# Patient Record
Sex: Male | Born: 1935 | Race: Black or African American | Hispanic: No | Marital: Married | Smoking: Never smoker
Health system: Southern US, Community
[De-identification: ages and names within clinical notes are randomized; demographics above are authoritative.]

## PROBLEM LIST (undated history)

## (undated) ENCOUNTER — Emergency Department (HOSPITAL_COMMUNITY): Admission: EM | Payer: Medicare Other | Source: Home / Self Care

## (undated) DIAGNOSIS — R7303 Prediabetes: Secondary | ICD-10-CM

## (undated) DIAGNOSIS — Z8489 Family history of other specified conditions: Secondary | ICD-10-CM

## (undated) DIAGNOSIS — I639 Cerebral infarction, unspecified: Secondary | ICD-10-CM

## (undated) DIAGNOSIS — N419 Inflammatory disease of prostate, unspecified: Secondary | ICD-10-CM

## (undated) DIAGNOSIS — E7800 Pure hypercholesterolemia, unspecified: Secondary | ICD-10-CM

## (undated) DIAGNOSIS — Z9989 Dependence on other enabling machines and devices: Secondary | ICD-10-CM

## (undated) DIAGNOSIS — I2510 Atherosclerotic heart disease of native coronary artery without angina pectoris: Secondary | ICD-10-CM

## (undated) DIAGNOSIS — G4733 Obstructive sleep apnea (adult) (pediatric): Secondary | ICD-10-CM

## (undated) DIAGNOSIS — I10 Essential (primary) hypertension: Secondary | ICD-10-CM

## (undated) DIAGNOSIS — I350 Nonrheumatic aortic (valve) stenosis: Secondary | ICD-10-CM

## (undated) DIAGNOSIS — I739 Peripheral vascular disease, unspecified: Secondary | ICD-10-CM

## (undated) DIAGNOSIS — K219 Gastro-esophageal reflux disease without esophagitis: Secondary | ICD-10-CM

## (undated) DIAGNOSIS — Z8719 Personal history of other diseases of the digestive system: Secondary | ICD-10-CM

## (undated) DIAGNOSIS — M1990 Unspecified osteoarthritis, unspecified site: Secondary | ICD-10-CM

## (undated) HISTORY — DX: Unspecified osteoarthritis, unspecified site: M19.90

## (undated) HISTORY — DX: Pure hypercholesterolemia, unspecified: E78.00

## (undated) HISTORY — DX: Peripheral vascular disease, unspecified: I73.9

## (undated) HISTORY — DX: Inflammatory disease of prostate, unspecified: N41.9

## (undated) HISTORY — DX: Gastro-esophageal reflux disease without esophagitis: K21.9

## (undated) HISTORY — DX: Essential (primary) hypertension: I10

## (undated) HISTORY — PX: CATARACT EXTRACTION W/ INTRAOCULAR LENS  IMPLANT, BILATERAL: SHX1307

## (undated) HISTORY — PX: CARPAL TUNNEL RELEASE: SHX101

## (undated) HISTORY — DX: Nonrheumatic aortic (valve) stenosis: I35.0

---

## 1987-09-11 HISTORY — PX: EXCISIONAL HEMORRHOIDECTOMY: SHX1541

## 2004-07-27 ENCOUNTER — Ambulatory Visit: Payer: Self-pay | Admitting: Pulmonary Disease

## 2004-08-18 ENCOUNTER — Ambulatory Visit: Payer: Self-pay | Admitting: Pulmonary Disease

## 2004-08-22 ENCOUNTER — Ambulatory Visit: Payer: Self-pay | Admitting: Pulmonary Disease

## 2004-12-11 ENCOUNTER — Ambulatory Visit: Payer: Self-pay | Admitting: Pulmonary Disease

## 2005-04-10 ENCOUNTER — Ambulatory Visit: Payer: Self-pay | Admitting: Pulmonary Disease

## 2005-04-19 ENCOUNTER — Ambulatory Visit: Payer: Self-pay | Admitting: Pulmonary Disease

## 2005-07-24 ENCOUNTER — Ambulatory Visit: Payer: Self-pay | Admitting: Pulmonary Disease

## 2005-08-21 ENCOUNTER — Encounter: Admission: RE | Admit: 2005-08-21 | Discharge: 2005-08-21 | Payer: Self-pay | Admitting: Orthopedic Surgery

## 2005-09-20 ENCOUNTER — Ambulatory Visit (HOSPITAL_BASED_OUTPATIENT_CLINIC_OR_DEPARTMENT_OTHER): Admission: RE | Admit: 2005-09-20 | Discharge: 2005-09-20 | Payer: Self-pay | Admitting: Orthopedic Surgery

## 2005-09-28 ENCOUNTER — Ambulatory Visit: Payer: Self-pay | Admitting: Pulmonary Disease

## 2005-11-07 ENCOUNTER — Ambulatory Visit: Payer: Self-pay | Admitting: Pulmonary Disease

## 2005-11-26 ENCOUNTER — Ambulatory Visit: Payer: Self-pay | Admitting: Pulmonary Disease

## 2006-02-11 ENCOUNTER — Ambulatory Visit: Payer: Self-pay | Admitting: Pulmonary Disease

## 2006-05-02 ENCOUNTER — Ambulatory Visit (HOSPITAL_BASED_OUTPATIENT_CLINIC_OR_DEPARTMENT_OTHER): Admission: RE | Admit: 2006-05-02 | Discharge: 2006-05-02 | Payer: Self-pay | Admitting: Orthopedic Surgery

## 2006-05-20 ENCOUNTER — Ambulatory Visit: Payer: Self-pay | Admitting: Pulmonary Disease

## 2006-07-17 ENCOUNTER — Ambulatory Visit: Payer: Self-pay | Admitting: Pulmonary Disease

## 2006-10-21 ENCOUNTER — Ambulatory Visit: Payer: Self-pay | Admitting: Pulmonary Disease

## 2006-10-21 LAB — CONVERTED CEMR LAB
ALT: 25 units/L (ref 0–40)
AST: 30 units/L (ref 0–37)
Basophils Relative: 0 % (ref 0.0–1.0)
Bilirubin, Direct: 0.1 mg/dL (ref 0.0–0.3)
CO2: 30 meq/L (ref 19–32)
Calcium: 9.2 mg/dL (ref 8.4–10.5)
Creatinine, Ser: 1.2 mg/dL (ref 0.4–1.5)
Eosinophils Relative: 3 % (ref 0.0–5.0)
GFR calc Af Amer: 76 mL/min
Glucose, Bld: 110 mg/dL — ABNORMAL HIGH (ref 70–99)
Hemoglobin: 14.3 g/dL (ref 13.0–17.0)
Lymphocytes Relative: 41 % (ref 12.0–46.0)
Neutro Abs: 1.9 10*3/uL (ref 1.4–7.7)
Platelets: 218 10*3/uL (ref 150–400)
RDW: 14.4 % (ref 11.5–14.6)
Total Bilirubin: 1 mg/dL (ref 0.3–1.2)
Total Protein: 7.4 g/dL (ref 6.0–8.3)
Triglycerides: 44 mg/dL (ref 0–149)
VLDL: 9 mg/dL (ref 0–40)
WBC: 4.3 10*3/uL — ABNORMAL LOW (ref 4.5–10.5)

## 2007-01-08 ENCOUNTER — Ambulatory Visit: Payer: Self-pay | Admitting: Pulmonary Disease

## 2007-01-08 LAB — CONVERTED CEMR LAB
AST: 26 units/L (ref 0–37)
Alkaline Phosphatase: 48 units/L (ref 39–117)
Basophils Relative: 0.3 % (ref 0.0–1.0)
CO2: 29 meq/L (ref 19–32)
Chloride: 105 meq/L (ref 96–112)
Cholesterol: 193 mg/dL (ref 0–200)
Creatinine, Ser: 1.1 mg/dL (ref 0.4–1.5)
HCT: 40.1 % (ref 39.0–52.0)
Hemoglobin: 13.2 g/dL (ref 13.0–17.0)
Hgb A1c MFr Bld: 6.8 % — ABNORMAL HIGH (ref 4.6–6.0)
LDL Cholesterol: 120 mg/dL — ABNORMAL HIGH (ref 0–99)
Monocytes Absolute: 0.7 10*3/uL (ref 0.2–0.7)
Neutrophils Relative %: 50.5 % (ref 43.0–77.0)
Potassium: 3.7 meq/L (ref 3.5–5.1)
RBC: 5.1 M/uL (ref 4.22–5.81)
RDW: 14.7 % — ABNORMAL HIGH (ref 11.5–14.6)
Sodium: 140 meq/L (ref 135–145)
TSH: 1.6 microintl units/mL (ref 0.35–5.50)
Total Bilirubin: 0.9 mg/dL (ref 0.3–1.2)
Total CHOL/HDL Ratio: 3
Total Protein: 7.1 g/dL (ref 6.0–8.3)
VLDL: 8 mg/dL (ref 0–40)
WBC: 3.9 10*3/uL — ABNORMAL LOW (ref 4.5–10.5)

## 2007-04-21 ENCOUNTER — Ambulatory Visit: Payer: Self-pay | Admitting: Pulmonary Disease

## 2007-04-21 LAB — CONVERTED CEMR LAB
ALT: 18 units/L (ref 0–53)
Albumin: 4 g/dL (ref 3.5–5.2)
Alkaline Phosphatase: 52 units/L (ref 39–117)
BUN: 10 mg/dL (ref 6–23)
CO2: 28 meq/L (ref 19–32)
Calcium: 8.8 mg/dL (ref 8.4–10.5)
GFR calc Af Amer: 84 mL/min
Potassium: 3.7 meq/L (ref 3.5–5.1)
Total CHOL/HDL Ratio: 2.5
Total Protein: 7.2 g/dL (ref 6.0–8.3)
Triglycerides: 33 mg/dL (ref 0–149)
VLDL: 7 mg/dL (ref 0–40)

## 2007-04-25 ENCOUNTER — Ambulatory Visit: Payer: Self-pay | Admitting: Pulmonary Disease

## 2007-04-25 LAB — CONVERTED CEMR LAB
Hemoglobin, Urine: NEGATIVE
Hgb A1c MFr Bld: 6.4 % — ABNORMAL HIGH (ref 4.6–6.0)
Leukocytes, UA: NEGATIVE
Nitrite: NEGATIVE
Urobilinogen, UA: 0.2 (ref 0.0–1.0)

## 2007-08-05 ENCOUNTER — Ambulatory Visit: Payer: Self-pay | Admitting: Pulmonary Disease

## 2007-11-12 ENCOUNTER — Encounter: Payer: Self-pay | Admitting: Pulmonary Disease

## 2007-12-01 DIAGNOSIS — K649 Unspecified hemorrhoids: Secondary | ICD-10-CM | POA: Insufficient documentation

## 2007-12-01 DIAGNOSIS — Z87448 Personal history of other diseases of urinary system: Secondary | ICD-10-CM | POA: Insufficient documentation

## 2007-12-01 DIAGNOSIS — I739 Peripheral vascular disease, unspecified: Secondary | ICD-10-CM

## 2007-12-01 DIAGNOSIS — K219 Gastro-esophageal reflux disease without esophagitis: Secondary | ICD-10-CM | POA: Insufficient documentation

## 2007-12-01 DIAGNOSIS — J309 Allergic rhinitis, unspecified: Secondary | ICD-10-CM | POA: Insufficient documentation

## 2007-12-01 DIAGNOSIS — I10 Essential (primary) hypertension: Secondary | ICD-10-CM | POA: Insufficient documentation

## 2007-12-01 DIAGNOSIS — M1990 Unspecified osteoarthritis, unspecified site: Secondary | ICD-10-CM | POA: Insufficient documentation

## 2007-12-01 DIAGNOSIS — E7800 Pure hypercholesterolemia, unspecified: Secondary | ICD-10-CM

## 2007-12-02 ENCOUNTER — Ambulatory Visit: Payer: Self-pay | Admitting: Pulmonary Disease

## 2007-12-02 DIAGNOSIS — R739 Hyperglycemia, unspecified: Secondary | ICD-10-CM | POA: Insufficient documentation

## 2007-12-21 LAB — CONVERTED CEMR LAB
ALT: 22 units/L (ref 0–53)
Alkaline Phosphatase: 50 units/L (ref 39–117)
Basophils Absolute: 0.1 10*3/uL (ref 0.0–0.1)
Bilirubin, Direct: 0.2 mg/dL (ref 0.0–0.3)
CO2: 32 meq/L (ref 19–32)
Calcium: 9.5 mg/dL (ref 8.4–10.5)
Cholesterol: 211 mg/dL (ref 0–200)
Direct LDL: 109.5 mg/dL
GFR calc Af Amer: 76 mL/min
HDL: 90.9 mg/dL (ref >39.0)
Hemoglobin: 14.4 g/dL (ref 13.0–17.0)
Lymphocytes Relative: 20.1 % (ref 12.0–46.0)
MCHC: 31.4 g/dL (ref 30.0–36.0)
Monocytes Relative: 16.3 % — ABNORMAL HIGH (ref 3.0–11.0)
Neutro Abs: 3.3 10*3/uL (ref 1.4–7.7)
Neutrophils Relative %: 58.3 % (ref 43.0–77.0)
PSA: 0.69 ng/mL (ref 0.10–4.00)
Platelets: 209 10*3/uL (ref 150–400)
Potassium: 4.2 meq/L (ref 3.5–5.1)
RDW: 15.1 % — ABNORMAL HIGH (ref 11.5–14.6)
Sodium: 137 meq/L (ref 135–145)
Total Bilirubin: 1 mg/dL (ref 0.3–1.2)
Total CHOL/HDL Ratio: 2.3
Triglycerides: 37 mg/dL (ref 0–149)
VLDL: 7 mg/dL (ref 0–40)

## 2008-01-23 ENCOUNTER — Telehealth: Payer: Self-pay | Admitting: Pulmonary Disease

## 2008-05-19 ENCOUNTER — Encounter: Payer: Self-pay | Admitting: Pulmonary Disease

## 2008-05-31 ENCOUNTER — Ambulatory Visit: Payer: Self-pay | Admitting: Pulmonary Disease

## 2008-05-31 LAB — CONVERTED CEMR LAB
BUN: 17 mg/dL (ref 6–23)
CO2: 29 meq/L (ref 19–32)
Chloride: 108 meq/L (ref 96–112)
Cholesterol: 190 mg/dL (ref 0–200)
Creatinine, Ser: 1.1 mg/dL (ref 0.4–1.5)
HDL: 76.7 mg/dL (ref >39.0)
Triglycerides: 34 mg/dL (ref 0–149)

## 2008-07-07 ENCOUNTER — Ambulatory Visit: Payer: Self-pay | Admitting: Pulmonary Disease

## 2008-07-12 LAB — CONVERTED CEMR LAB
Fecal Occult Blood: NEGATIVE
OCCULT 3: NEGATIVE
OCCULT 4: NEGATIVE
OCCULT 5: NEGATIVE

## 2008-11-17 ENCOUNTER — Encounter: Payer: Self-pay | Admitting: Pulmonary Disease

## 2008-11-29 ENCOUNTER — Ambulatory Visit: Payer: Self-pay | Admitting: Pulmonary Disease

## 2008-11-29 IMAGING — CR DG CHEST 2V
2 series · 2 of 2 positions shown · non-contrast
Comparison: [DATE]

CLINICAL DATA: Hypertension.  Peripheral vascular disease.

CHEST - 2 VIEW

[view not recorded (1 of 2)]
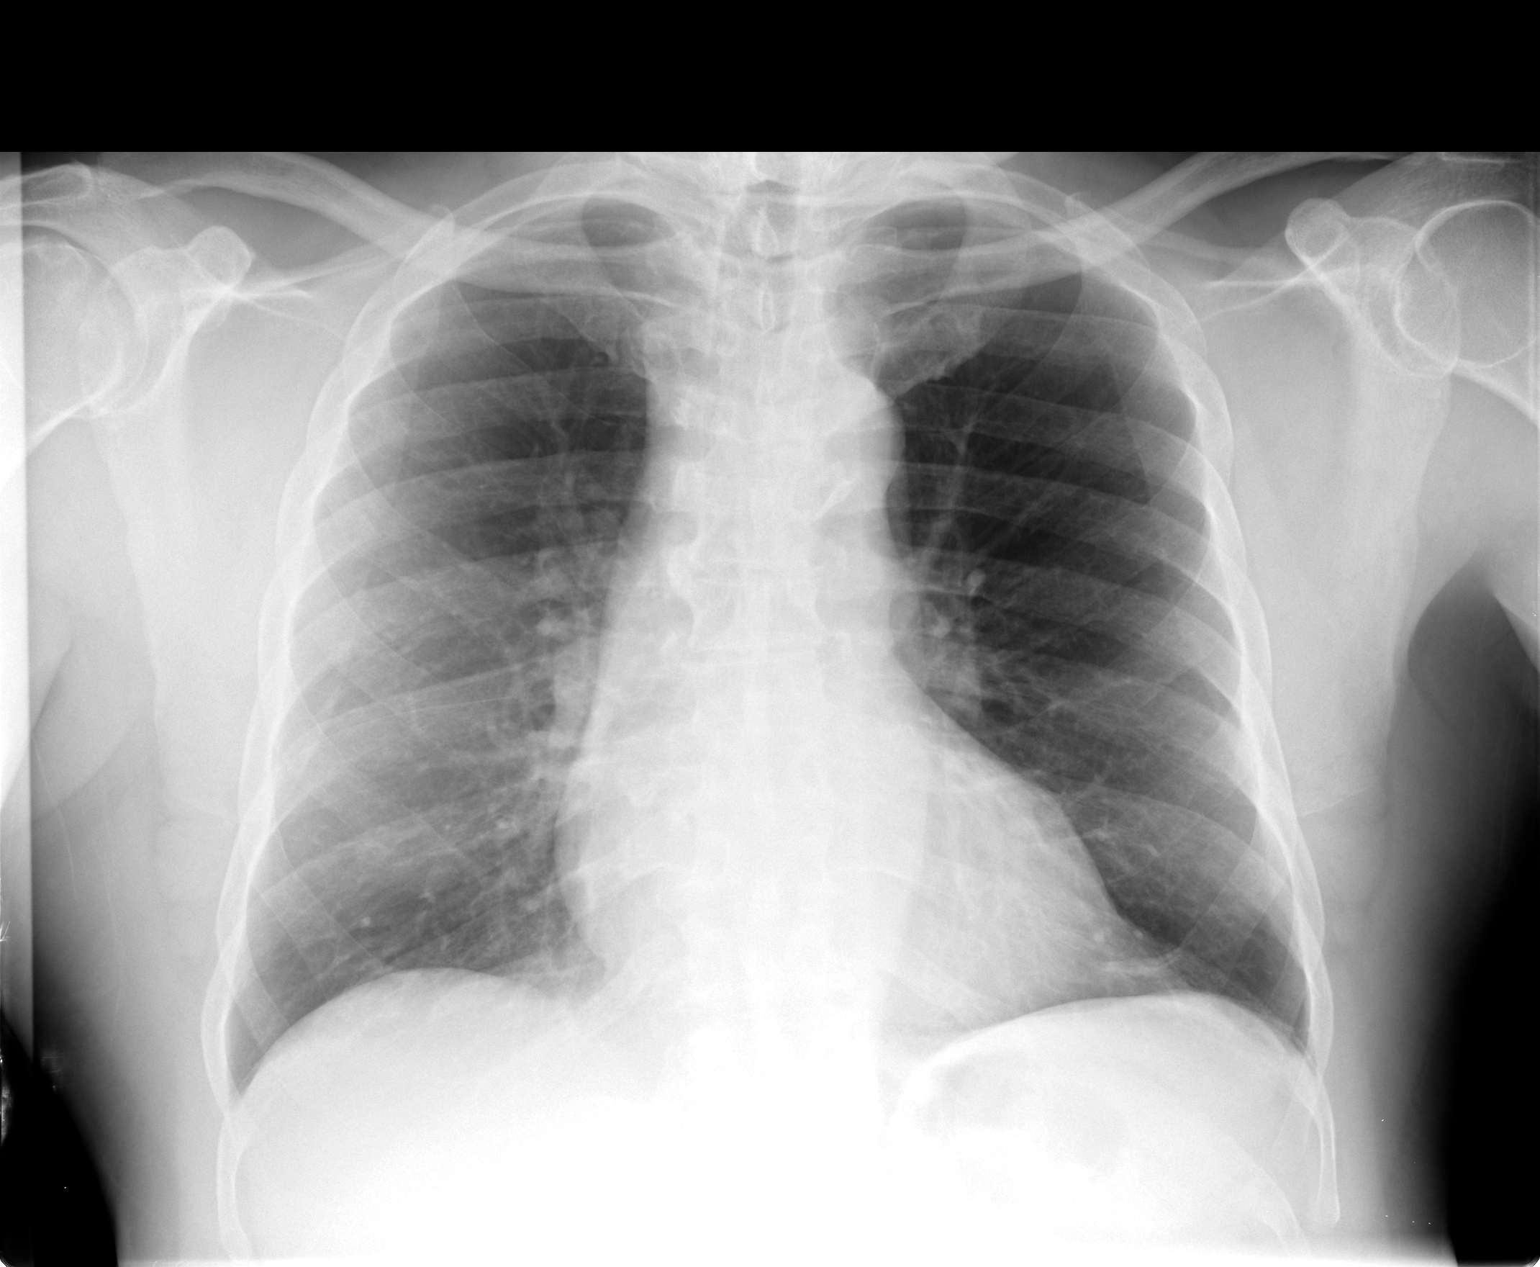

[view not recorded (2 of 2)]
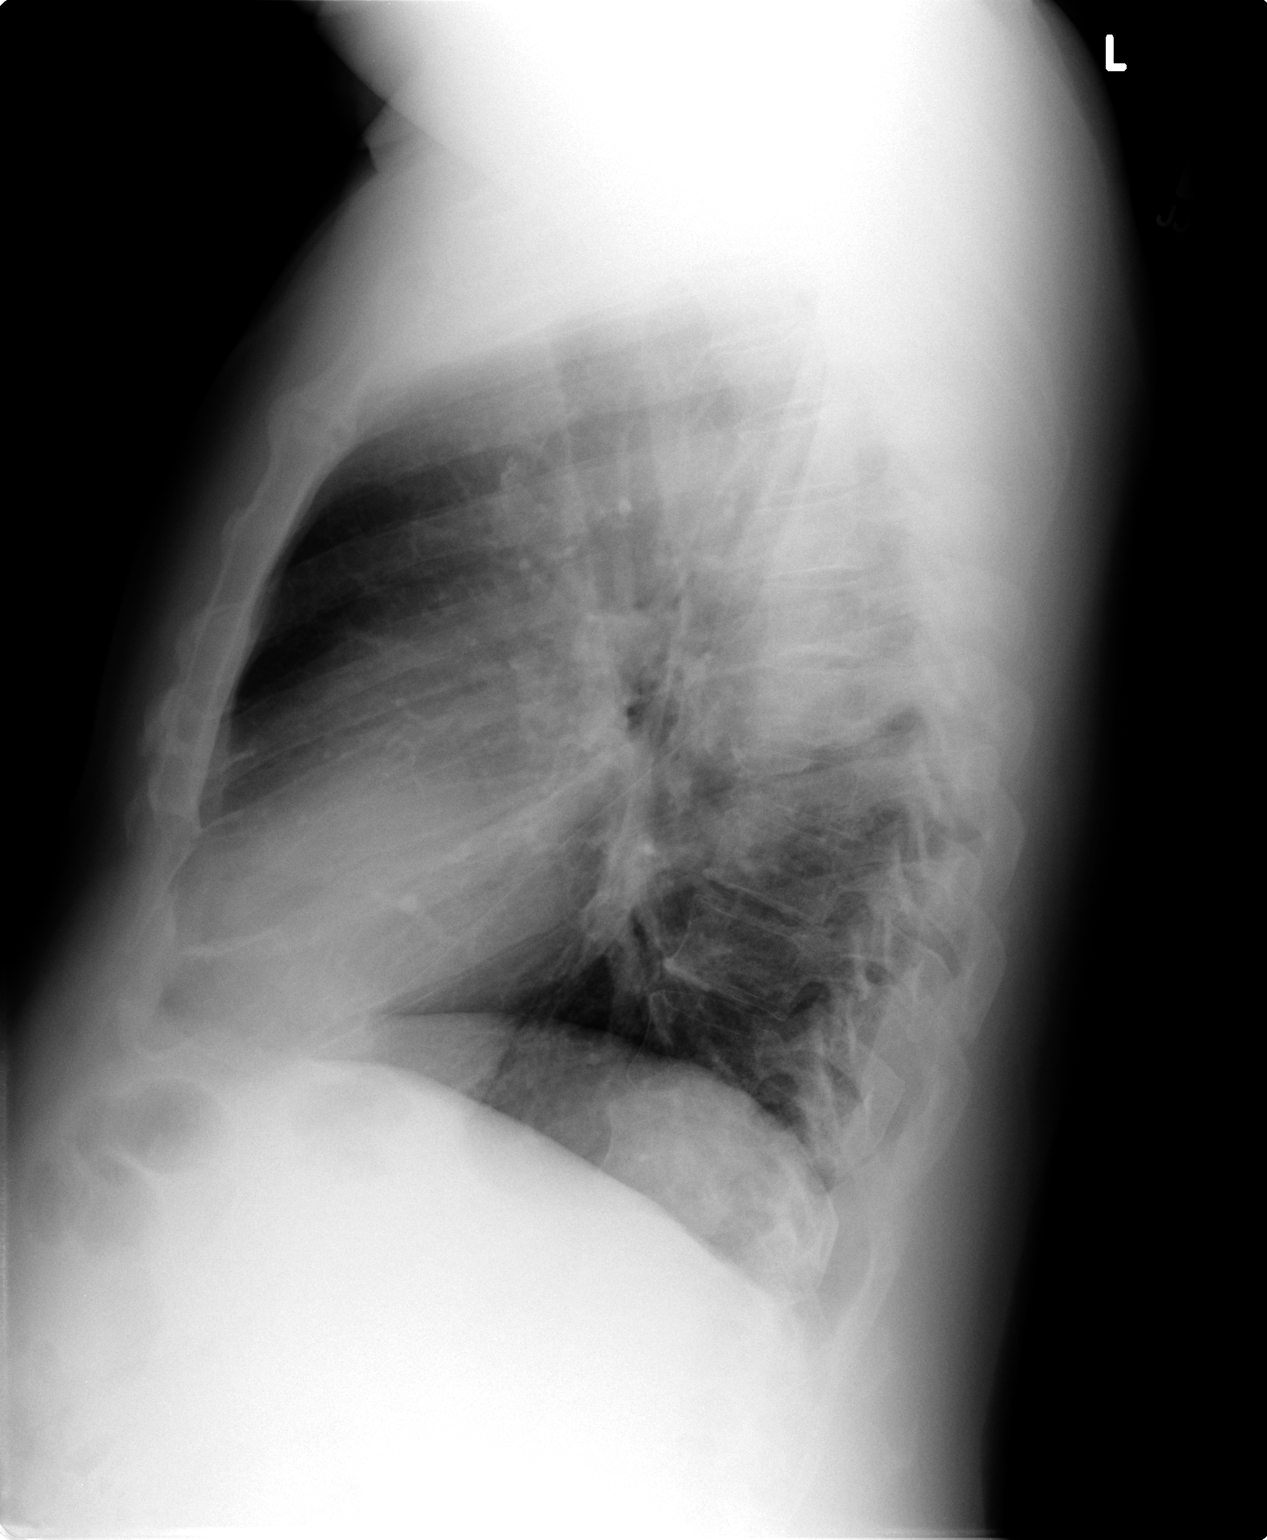

[2 of 2 positions shown; findings below may reference images not displayed]

FINDINGS: Heart size is normal.  The aorta shows calcification but
is otherwise unremarkable.  The lungs are clear.  No effusions.  No
significant bony finding.
IMPRESSION: No active disease

## 2008-12-14 LAB — CONVERTED CEMR LAB
BUN: 17 mg/dL (ref 6–23)
Basophils Relative: 0.4 % (ref 0.0–3.0)
Bilirubin, Direct: 0.2 mg/dL (ref 0.0–0.3)
CO2: 32 meq/L (ref 19–32)
Chloride: 104 meq/L (ref 96–112)
Cholesterol: 238 mg/dL — ABNORMAL HIGH (ref 0–200)
Creatinine, Ser: 1.2 mg/dL (ref 0.4–1.5)
Eosinophils Absolute: 0.2 10*3/uL (ref 0.0–0.7)
Glucose, Bld: 108 mg/dL — ABNORMAL HIGH (ref 70–99)
Hgb A1c MFr Bld: 6.7 % — ABNORMAL HIGH (ref 4.6–6.5)
MCHC: 32.4 g/dL (ref 30.0–36.0)
MCV: 80 fL (ref 78.0–100.0)
Monocytes Absolute: 0.8 10*3/uL (ref 0.1–1.0)
Neutrophils Relative %: 48 % (ref 43.0–77.0)
PSA: 0.66 ng/mL (ref 0.10–4.00)
Platelets: 182 10*3/uL (ref 150.0–400.0)
RBC: 5.36 M/uL (ref 4.22–5.81)
Total Protein: 7.3 g/dL (ref 6.0–8.3)

## 2009-03-30 ENCOUNTER — Telehealth (INDEPENDENT_AMBULATORY_CARE_PROVIDER_SITE_OTHER): Payer: Self-pay | Admitting: *Deleted

## 2009-05-18 ENCOUNTER — Encounter: Payer: Self-pay | Admitting: Pulmonary Disease

## 2009-05-19 ENCOUNTER — Ambulatory Visit: Payer: Self-pay | Admitting: Pulmonary Disease

## 2009-05-21 LAB — CONVERTED CEMR LAB
ALT: 35 units/L (ref 0–53)
AST: 35 units/L (ref 0–37)
Albumin: 4.2 g/dL (ref 3.5–5.2)
Alkaline Phosphatase: 45 units/L (ref 39–117)
CO2: 30 meq/L (ref 19–32)
Chloride: 99 meq/L (ref 96–112)
Glucose, Bld: 107 mg/dL — ABNORMAL HIGH (ref 70–99)
Potassium: 3.6 meq/L (ref 3.5–5.1)
Sodium: 138 meq/L (ref 135–145)
Total Protein: 7.4 g/dL (ref 6.0–8.3)
Triglycerides: 27 mg/dL (ref 0.0–149.0)

## 2009-07-12 ENCOUNTER — Ambulatory Visit: Payer: Self-pay | Admitting: Pulmonary Disease

## 2009-11-15 ENCOUNTER — Ambulatory Visit: Payer: Self-pay | Admitting: Pulmonary Disease

## 2009-11-20 LAB — CONVERTED CEMR LAB
ALT: 24 units/L (ref 0–53)
AST: 30 units/L (ref 0–37)
Basophils Relative: 1.2 % (ref 0.0–3.0)
Bilirubin, Direct: 0.2 mg/dL (ref 0.0–0.3)
Chloride: 105 meq/L (ref 96–112)
Eosinophils Absolute: 0.2 10*3/uL (ref 0.0–0.7)
HCT: 44.1 % (ref 39.0–52.0)
LDL Cholesterol: 98 mg/dL (ref 0–99)
Lymphs Abs: 1.1 10*3/uL (ref 0.7–4.0)
MCHC: 31.5 g/dL (ref 30.0–36.0)
MCV: 80.9 fL (ref 78.0–100.0)
Monocytes Absolute: 0.6 10*3/uL (ref 0.1–1.0)
Neutrophils Relative %: 55.6 % (ref 43.0–77.0)
PSA: 0.69 ng/mL (ref 0.10–4.00)
Platelets: 182 10*3/uL (ref 150.0–400.0)
Potassium: 4.1 meq/L (ref 3.5–5.1)
RBC: 5.45 M/uL (ref 4.22–5.81)
Total Bilirubin: 0.6 mg/dL (ref 0.3–1.2)
Total CHOL/HDL Ratio: 2

## 2010-05-26 ENCOUNTER — Ambulatory Visit: Payer: Self-pay | Admitting: Pulmonary Disease

## 2010-05-30 LAB — CONVERTED CEMR LAB
BUN: 17 mg/dL (ref 6–23)
CO2: 29 meq/L (ref 19–32)
Calcium: 9 mg/dL (ref 8.4–10.5)
Cholesterol: 222 mg/dL — ABNORMAL HIGH (ref 0–200)
Creatinine, Ser: 1.1 mg/dL (ref 0.4–1.5)
Direct LDL: 136.2 mg/dL
Total CHOL/HDL Ratio: 3
VLDL: 6.6 mg/dL (ref 0.0–40.0)

## 2010-06-06 ENCOUNTER — Encounter: Payer: Self-pay | Admitting: Pulmonary Disease

## 2010-06-14 ENCOUNTER — Telehealth: Payer: Self-pay | Admitting: Pulmonary Disease

## 2010-06-16 ENCOUNTER — Telehealth (INDEPENDENT_AMBULATORY_CARE_PROVIDER_SITE_OTHER): Payer: Self-pay | Admitting: *Deleted

## 2010-07-24 ENCOUNTER — Telehealth (INDEPENDENT_AMBULATORY_CARE_PROVIDER_SITE_OTHER): Payer: Self-pay | Admitting: *Deleted

## 2010-08-09 ENCOUNTER — Telehealth: Payer: Self-pay | Admitting: Pulmonary Disease

## 2010-10-10 NOTE — Progress Notes (Signed)
Summary: [facility] PA  Phone Note Outgoing Call   Call placed by: Vernie Murders,  June 16, 2010 2:39 PM Call placed to: Insurer Summary of Call: Recieved PA form from [facility] for [facility].  Called [employer] to initiate PA.  Was advised that alternate meds are fluticasone, nasonex, and flunisolide.  Will forward to SN to see what he prefers the patient switch to. Initial call taken by: Vernie Murders,  June 16, 2010 2:47 PM  Follow-up for Phone Call        Spoke with SN; okay to change to Nasonex #1 1-2 sprays in each nostril two times a day with 3 refills. Sent to pharmacy.Reynaldo Minium CMA  June 16, 2010 2:53 PM     New/Updated Medications: NASONEX 50 MCG/ACT SUSP (MOMETASONE FUROATE) 1-2 sprays in each nostril two times a day Prescriptions: NASONEX 50 MCG/ACT SUSP (MOMETASONE FUROATE) 1-2 sprays in each nostril two times a day  #1 x 3   Entered by:   Reynaldo Minium CMA   Authorized by:   Michele Mcalpine MD   Signed by:   Reynaldo Minium CMA on 06/16/2010   Method used:   [facility] to        [phone]       [address] [place], [place]  [address]       Ph: [phone] or [phone]       Fax: [phone]   RxID:   [id number]

## 2010-10-10 NOTE — Letter (Signed)
Summary: [facility]   Imported By: Sherian Rein 06/13/2010 10:27:35  _____________________________________________________________________  External Attachment:    Type:   Image     Comment:   External Document

## 2010-10-10 NOTE — Progress Notes (Signed)
Summary: dizziness - calling back  Phone Note Call from Patient   Caller: Patient Call For: nadel Summary of Call: pt complaining of dizziness no coughing/ have sinus drainage. Initial call taken by: Rickard Patience,  July 24, 2010 2:26 PM  Follow-up for Phone Call        Pt c/o feeling dizzy when he stood up and walked to the door today. This is the only time this has happened. Pt denies sinus cngestion, no ear pain or congestion. Pt states he had his ears cleaned out recently.  Pt also states his BP been running normal. Please advise. Carron Curie CMA  July 24, 2010 3:21 PM   Additional Follow-up for Phone Call Additional follow up Details #1::        per SN---change positions slowly, watch and see how he does---and thanks for the update on his status. Randell Loop [facility]  July 24, 2010 4:05 PM   [facility]  Gweneth Dimitri RN  July 24, 2010 4:13 PM   LMTCB.Reynaldo Minium CMA  July 24, 2010 4:44 PM      Additional Follow-up for Phone Call Additional follow up Details #2::    Patient returning call.  [phone] [facility] [address]  July 25, 2010 8:07 AM  [facility] x1. Abigail Miyamoto RN  July 25, 2010 8:39 AM   Pt informed of Dr Jodelle Green recommendations.  Pt will call back if symptoms do not improve. Abigail Miyamoto RN  July 25, 2010 9:07 AM

## 2010-10-10 NOTE — Assessment & Plan Note (Signed)
Summary: 6 month follow up/la   CC:  6 month ROV & review of mult medical problems....  History of Present Illness: 77 y/o BM here for a follow up visit... he has mult med problems as noted below...   ~  May 19, 2009:  he's had a good 6 months- no new complaints or concerns... BP well controlled on meds- taking regularly & tol well... he's been on diet & weight 181# (no change) w/ BS at home "all OK" per pt... FLP 3/10 showed incr TChol & LDL as noted so his Simvastatin was incr from 20 to 40mg /d (tol well & due for f/u FLP)... he still follows up w/ DrDahlstedt for Urology & was seen yest- doing well he reports...    ~  November 15, 2009:  he continues stable- no new complaints or concerns...  BP controleed on meds;  FLP improved w/ Simva40;  DM stable on diet alone- weight 181# & needs better diet & exercise program!   ~  May 26, 2010:  another good 53mo- no new complaints or concerns... BP controlled on meds;  TChol & LDL are not at goal on the Simva40, and since he is on Norvasc as well we will have to switch to Lipitor;  BS remains adeq on diet alone;  he exercises w/ yard work & weight is down 3# to 178# today... OK 2011 Flu vaccine.    Current Problem List:  ALLERGIC RHINITIS (ICD-477.9) - he uses OTC Zyrtek & Mucinex, and knows to avoid pseudophed...  HYPERTENSION (ICD-401.9) - on NORVASC 10mg /d,  MICARDIS/Hct 80-25 daily,  & K20/d...   ~  NuclearStressTest 4/05 was norm- no infarct or iscemia, EF=72%...  ~  BP today = 116/64, feeling well, taking meds regularly, no reported side-effects... home checks are all good per pt- "even better than here"... denies HA, fatigue, visual changes, CP, palipit, dizziness, syncope, dyspnea, edema, etc...  PERIPHERAL VASCULAR DISEASE (ICD-443.9) - on ASA 81mg /d... no leg pain or claudic symptoms.  ~  ABI's 2005 showed .92R & .78L...  HYPERCHOLESTEROLEMIA (ICD-272.0) - on SIMVASTATIN 40mg /d + FISH OIL & Garlic pills...  ~  FLP 8/08 on  Simva20 showed TChol 199, TG 33, HDL 81, LDL 111  ~  FLP 3/09 on Simva20 showed TChol 211, TG 37, HDL 91, LDL 110... great HDL!!!  ~  FLP 9/09 on Simva20 showed TChol 190, TG 34, HDL 77, LDL 107  ~  FLP 3/10 on Simva20 showed TChol 238, Tg 35, HDL 76, LDL 138... rec> incr Simva40.  ~  FLP 9/10 on Simva40 showed TChol 206, TG 27, HDL 81, LDL 108  ~  FLP 3/11 on Simva40 showed TChol 189, TG 32, HDL 83, LDL 98  ~  FLP 9/11 on Simva40 showed TChol 222, TG 33, HDL 76, LDL 136... not at goal, rec> ch to Lipitor40.  DIABETES MELLITUS, BORDERLINE (ICD-790.29) - on diet alone & we discussed diet + exercise program.  ~  labs 8/08 showed BS=107, HgA1c=6.4 on diet alone... rec to lose some weight.  ~  labs 3/09 (wt=177#)showed BS= 94, HgA1c= 6.6.Marland KitchenMarland Kitchen rec- better diet efforts...  ~  labs 9/09 (wt=176#) showed BS= 101, HgA1c= 6.6  ~  labs 3/10 (wt=181#) showed BS= 108, A1c= 6.7  ~  labs 9/10 (wt=181#) showed BS= 107, A1c= 6.6  ~  labs 3/11 (wt=181#) showed BS= 103  ~  labs 9/11 (wt=178##) showed BS= 96, A1c 6.9  GERD (ICD-530.81) - he takes Zantac 150mg /d as needed.Marland KitchenMarland Kitchen  HEMORRHOIDS (ICD-455.6) - last colonoscopy was 4/04 by DrPatterson and norm x for hems...  PROSTATITIS, HX OF (ICD-V13.09) - followed by DrDahlstadt & seen 9/09- note reviewed... he has BPH,  renal cyst, ED... Rx w/ Viagra as needed.  ~  labs 3/09 showed PSA= 0.69  ~  labs 3/10 showed PSA= 0.66  ~  labs 3/11 showed PSA= 0.69  DEGENERATIVE JOINT DISEASE (ICD-715.90)  HEALTH MAINTENANCE - he gets the yearly seasonal flu vaccines... [facility]age 28[facility]... had Tetanus shot in ER  ~2006...   Preventive Screening-Counseling & Management  Alcohol-Tobacco     Smoking Status: never  Allergies: 1)  ! Tetracycline  Comments:  Nurse/Medical Assistant: The patient's medications and allergies were reviewed with the patient and were updated in the Medication and Allergy Lists.  Past History:  Past  Medical History: ALLERGIC RHINITIS (ICD-477.9) HYPERTENSION (ICD-401.9) PERIPHERAL VASCULAR DISEASE (ICD-443.9) HYPERCHOLESTEROLEMIA (ICD-272.0) DIABETES MELLITUS, BORDERLINE (ICD-790.29) GERD (ICD-530.81) HEMORRHOIDS (ICD-455.6) PROSTATITIS, HX OF (ICD-V13.09) DEGENERATIVE JOINT DISEASE (ICD-715.90)  Past Surgical History: S/P wisdom teeth surg S/P inguinal hernia repair 1989 S/P bilat CTS by DrSypher  Family History: Reviewed history and no changes required.  Social History: Reviewed history from 05/31/2008 and no changes required. non smoker no etoh retired  Review of Systems      See HPI  The patient denies anorexia, fever, weight loss, weight gain, vision loss, decreased hearing, hoarseness, chest pain, syncope, dyspnea on exertion, peripheral edema, prolonged cough, headaches, hemoptysis, abdominal pain, melena, hematochezia, severe indigestion/heartburn, hematuria, incontinence, muscle weakness, suspicious skin lesions, transient blindness, difficulty walking, depression, unusual weight change, abnormal bleeding, enlarged lymph nodes, and angioedema.    Vital Signs:  Patient profile:   77 year old male Height:      65 inches Weight:      178 pounds BMI:     29.73 O2 Sat:      100 % on Room air Temp:     98.4 degrees F oral Pulse rate:   79 / minute BP sitting:   116 / 64  (left arm) Cuff size:   regular  Vitals Entered By: Randell Loop CMA (May 26, 2010 10:25 AM)  O2 Sat at Rest %:  100 O2 Flow:  Room air CC: 6 month ROV & review of mult medical problems... Is Patient Diabetic? No Pain Assessment Patient in pain? no      Comments no changes in meds today   Physical Exam  Additional Exam:  WD, Overweight, 77 y/o BM in NAD... GENERAL:  Alert & oriented; pleasant & cooperative... HEENT:  [place]/AT, EOM-wnl, PERRLA, EACs-clear, TMs-wnl, NOSE-clear, THROAT-clear & wnl. NECK:  Supple w/ fair ROM; no JVD; normal carotid impulses w/o bruits; no  thyromegaly or nodules palpated; no lymphadenopathy. CHEST:  Clear to P & A; without wheezes/ rales/ or rhonchi. HEART:  Regular Rhythm; without murmurs/ rubs/ or gallops. ABDOMEN:  Soft & nontender; normal bowel sounds; no organomegaly or masses detected. EXT: without deformities, mild arthritic changes; no varicose veins/ +venous insuffic/ no edema. NEURO:  CN's intact; motor testing normal; sensory testing normal; gait normal & balance OK. DERM:  No lesions noted; no rash etc...    MISC. Report  Procedure date:  05/26/2010  Findings:      BMP (METABOL)   Sodium                    139 mEq/L  135-145   Potassium                 4.0 mEq/L                   3.5-5.1   Chloride                  103 mEq/L                   96-112   Carbon Dioxide            29 mEq/L                    19-32   Glucose                   96 mg/dL                    16-10   BUN                       17 mg/dL                    9-60   Creatinine                1.1 mg/dL                   4.5-4.0   Calcium                   9.0 mg/dL                   9.8-11.9   GFR                       81.17 mL/min                >60  Hemoglobin A1C (A1C)   Hemoglobin A1C       [H]  6.9 %                       4.6-6.5   Lipid Panel (LIPID)   Cholesterol          [H]  222 mg/dL                   1-478   Triglycerides             33.0 mg/dL                  2.9-562.1   HDL                       30.86 mg/dL                 >57.84  Cholesterol LDL - Direct                             136.2 mg/dL   Impression & Recommendations:  Problem # 1:  HYPERTENSION (ICD-401.9) Contreolled> continue current meds. His updated medication list for this problem includes:    Amlodipine Besylate 10 Mg Tabs (Amlodipine besylate) .Marland Kitchen... Take 1 tab once daily...    Micardis Hct 80-25 Mg Tabs (Telmisartan-hctz) .Marland Kitchen... Take one tablet every day  Orders: TLB-BMP (Basic Metabolic Panel-BMET) (80048-METABOL) TLB-A1C / Hgb A1C  (Glycohemoglobin) (83036-A1C) TLB-Lipid Panel (80061-LIPID)  Problem # 2:  PERIPHERAL VASCULAR DISEASE (ICD-443.9) Stable on ASA & exercise program...  Problem # 3:  HYPERCHOLESTEROLEMIA (ICD-272.0) Numbers aren't as good despite 3# weight loss... rec change Simva40 to Lipitor 40... continue diet + exercise. His updated medication list for this problem includes:    Simvastatin 40 Mg Tabs (Simvastatin) .Marland Kitchen... Take one tablet by mouth at bedtime  Problem # 4:  DIABETES MELLITUS, BORDERLINE (ICD-790.29) Stable but A1c is drifting closer to 7... rec better diet, & exercise> not yet on meds.  Problem # 5:  GERD (ICD-530.81) GI is stable... same Rx. His updated medication list for this problem includes:    Zantac 150 Maximum Strength 150 Mg Tabs (Ranitidine hcl) .Marland Kitchen... Take 1 tab by mouth once daily as directed...  Problem # 6:  PROSTATITIS, HX OF (ICD-V13.09) GU is stable...  Problem # 7:  OTHER MEDICAL PROBLEMS AS NOTED>>> OK Flu shot today...  Complete Medication List: 1)  Mucinex 600 Mg Xr12h-tab (Guaifenesin) .... Take 1-2 tabs by mouth two times a day as needed for congestion 2)  Aspirin Adult Low Strength 81 Mg Tbec (Aspirin) .... Take 1 tablet by mouth once a day 3)  Amlodipine Besylate 10 Mg Tabs (Amlodipine besylate) .... Take 1 tab once daily.Marland KitchenMarland Kitchen 4)  Micardis Hct 80-25 Mg Tabs (Telmisartan-hctz) .... Take one tablet every day 5)  Klor-con M20 20 Meq Tbcr (Potassium chloride crys cr) .... Take 1 tablet by mouth once a day 6)  Simvastatin 40 Mg Tabs (Simvastatin) .... Take one tablet by mouth at bedtime 7)  Fish Oil 1000 Mg Caps (Omega-3 fatty acids) .... Take 1 tablet by mouth once a day 8)  Garlic Oil 1000 Mg Caps (Garlic) .... Take 1 tablet by mouth once a day 9)  Zantac 150 Maximum Strength 150 Mg Tabs (Ranitidine hcl) .... Take 1 tab by mouth once daily as directed... 10)  Centrum Silver Tabs (Multiple vitamins-minerals) .... Take 1 tablet by mouth once a day  Patient  Instructions: 1)  Today we updated your med list- see below.... 2)  Continue your current medications the same... 3)  Today we did your follow up FASTING Chol & DM labs... please call the "phone tree" in a few days for your lab results.Marland KitchenMarland Kitchen 4)  Stay as active as poss, and be as CAREFUL as poss... 5)  Call for any problems.Marland KitchenMarland Kitchen 6)  Please schedule a follow-up appointment in 6 months.

## 2010-10-10 NOTE — Assessment & Plan Note (Signed)
Summary: rov 6 months///kp   CC:  6 month ROV & review of mult medical problems....  History of Present Illness: 77 y/o BM here for a follow up visit... he has mult med problems as noted below...   ~  May 19, 2009:  he's had a good 6 months- no new complaints or concerns... BP well controlled on meds- taking regularly & tol well... he's been on diet & weight 181# (no change) w/ BS at home "all OK" per pt... FLP 3/10 showed incr TChol & LDL as noted so his Simvastatin was incr from 20 to 40mg /d (tol well & due for f/u FLP)... he still follows up w/ DrDahlstedt for Urology & was seen yest- doing well he reports...    ~  November 15, 2009:  he continues stable- no new complaints or concerns...  BP controleed on meds;  FLP improved w/ Simva40;  DM stable on diet alone- weight 181# & needs better diet & exercise program!    Current Problem List:  ALLERGIC RHINITIS (ICD-477.9) - he uses OTC Zyrtek & Mucinex, and knows to avoid pseudophed...  HYPERTENSION (ICD-401.9) - on NORVASC 10mg /d,  MICARDIS/Hct 80-25 daily,  & K20/d...   ~  NuclearStressTest 4/05 was norm- no infarct or iscemia, EF=72%...  ~  BP today = 128/80, feeling well, taking meds regularly, no reported side-effects... home checks are all good per pt- "even better than here"... denies HA, fatigue, visual changes, CP, palipit, dizziness, syncope, dyspnea, edema, etc...  PERIPHERAL VASCULAR DISEASE (ICD-443.9) - on ASA 81mg /d... no leg pain or claudic symptoms.  ~  ABI's 2005 showed .92R & .78L...  HYPERCHOLESTEROLEMIA (ICD-272.0) - on SIMVASTATIN 40mg /d + FISH OIL & Garlic pills...  ~  FLP 8/08 on Simva20 showed TChol 199, TG 33, HDL 81, LDL 111  ~  FLP 3/09 on Simva20 showed TChol 211, TG 37, HDL 91, LDL 110... great HDL!!!  ~  FLP 9/09 on Simva20 showed TChol 190, TG 34, HDL 77, LDL 107  ~  FLP 3/10 on Simva20 showed TChol 238, Tg 35, HDL 76, LDL 138... rec> incr Simva40.  ~  FLP 9/10 on Simva40 showed TChol 206, TG 27, HDL 81,  LDL 108  ~  FLP 3/11 on Simva40 showed TChol 189, TG 32, HDL 83, LDL 98  DIABETES MELLITUS, BORDERLINE (ICD-790.29) - on diet alone & we discussed diet + exercise program.  ~  labs 8/08 showed BS=107, HgA1c=6.4 on diet alone... rec to lose some weight.  ~  labs 3/09 (wt=177#)showed BS= 94, HgA1c= 6.6.Marland KitchenMarland Kitchen rec- better diet efforts...  ~  labs 9/09 (wt=176#) showed BS= 101, HgA1c= 6.6  ~  labs 3/10 (wt=181#) showed BS= 108, A1c= 6.7  ~  labs 9/10 (wt=181#) showed BS= 107, A1c= 6.6  ~  labs 3/11 (wt=181#) showed BS= 103  GERD (ICD-530.81) - he takes Zantac 150mg /d as needed...  HEMORRHOIDS (ICD-455.6) - last colonoscopy was 4/04 by DrPatterson and norm x for hems...  PROSTATITIS, HX OF (ICD-V13.09) - followed by DrDahlstadt & seen 9/09- note reviewed... he has BPH,  renal cyst, ED... Rx w/ Viagra as needed.  ~  labs 3/09 showed PSA= 0.69  ~  labs 3/10 showed PSA= 0.66  ~  labs 3/11 showed PSA= 0.69  DEGENERATIVE JOINT DISEASE (ICD-715.90)  HEALTH MAINTENANCE - he gets the yearly seasonal flu vaccines... [facility]age 47[facility]... had Tetanus shot in ER  ~2006...   Allergies: 1)  ! Tetracycline  Comments:  Nurse/Medical Assistant: The patient's medications and allergies were reviewed with the patient and were updated in the Medication and Allergy Lists.  Past History:  Past Medical History:  ALLERGIC RHINITIS (ICD-477.9) HYPERTENSION (ICD-401.9) PERIPHERAL VASCULAR DISEASE (ICD-443.9) HYPERCHOLESTEROLEMIA (ICD-272.0) DIABETES MELLITUS, BORDERLINE (ICD-790.29) GERD (ICD-530.81) HEMORRHOIDS (ICD-455.6) PROSTATITIS, HX OF (ICD-V13.09) DEGENERATIVE JOINT DISEASE (ICD-715.90)  Past Surgical History: S/P wisdom teeth surg S/P inguinal hernia repair 1989 S/P bilat CTS by DrSypher  Family History: Reviewed history and no changes required.  Social History: Reviewed history from 05/31/2008 and no changes required. non smoker no  etoh retired  Review of Systems      See HPI  The patient denies anorexia, fever, weight loss, weight gain, vision loss, decreased hearing, hoarseness, chest pain, syncope, dyspnea on exertion, peripheral edema, prolonged cough, headaches, hemoptysis, abdominal pain, melena, hematochezia, severe indigestion/heartburn, hematuria, incontinence, muscle weakness, suspicious skin lesions, transient blindness, difficulty walking, depression, unusual weight change, abnormal bleeding, enlarged lymph nodes, and angioedema.    Vital Signs:  Patient profile:   77 year old male Height:      65 inches Weight:      180.50 pounds BMI:     30.15 O2 Sat:      99 % on Room air Temp:     97.9 degrees F oral Pulse rate:   85 / minute BP sitting:   128 / 80  (left arm) Cuff size:   regular  Vitals Entered By: Randell Loop CMA (November 15, 2009 9:16 AM)  O2 Sat at Rest %:  99 O2 Flow:  Room air CC: 6 month ROV & review of mult medical problems... Is Patient Diabetic? No Pain Assessment Patient in pain? no      Comments NO CHANGES IN MEDS TODAY   Physical Exam  Additional Exam:  WD, Overweight, 77 y/o BM in NAD... GENERAL:  Alert & oriented; pleasant & cooperative... HEENT:  [place]/AT, EOM-wnl, PERRLA, EACs-clear, TMs-wnl, NOSE-clear, THROAT-clear & wnl. NECK:  Supple w/ fair ROM; no JVD; normal carotid impulses w/o bruits; no thyromegaly or nodules palpated; no lymphadenopathy. CHEST:  Clear to P & A; without wheezes/ rales/ or rhonchi. HEART:  Regular Rhythm; without murmurs/ rubs/ or gallops. ABDOMEN:  Soft & nontender; normal bowel sounds; no organomegaly or masses detected. EXT: without deformities, mild arthritic changes; no varicose veins/ +venous insuffic/ no edema. NEURO:  CN's intact; motor testing normal; sensory testing normal; gait normal & balance OK. DERM:  No lesions noted; no rash etc...    Impression & Recommendations:  Problem # 1:  HYPERTENSION (ICD-401.9) Controlled-   doing well... same meds. His updated medication list for this problem includes:    Amlodipine Besylate 10 Mg Tabs (Amlodipine besylate) .Marland Kitchen... Take 1 tab once daily...    Micardis Hct 80-25 Mg Tabs (Telmisartan-hctz) .Marland Kitchen... Take one tablet every day  Orders: TLB-Lipid Panel (80061-LIPID) TLB-BMP (Basic Metabolic Panel-BMET) (80048-METABOL) TLB-Hepatic/Liver Function Pnl (80076-HEPATIC) TLB-CBC Platelet - w/Differential (85025-CBCD) TLB-TSH (Thyroid Stimulating Hormone) (84443-TSH) TLB-PSA (Prostate Specific Antigen) (84153-PSA)  Problem # 2:  HYPERCHOLESTEROLEMIA (ICD-272.0) Stable-  same med + diet... His updated medication list for this problem includes:    Simvastatin 40 Mg Tabs (Simvastatin) .Marland Kitchen... Take one tablet by mouth at bedtime  Problem # 3:  DIABETES MELLITUS, BORDERLINE (ICD-790.29) BS doing well... continue diet + exercise.  Problem # 4:  GERD (ICD-530.81) GI stable... His updated medication list for this problem includes:    Zantac 150 Maximum Strength 150 Mg Tabs (Ranitidine hcl) .Marland Kitchen... Take  1 tab by mouth once daily as directed...  Problem # 5:  DEGENERATIVE JOINT DISEASE (ICD-715.90) Stable-  continue exercise... His updated medication list for this problem includes:    Aspirin Adult Low Strength 81 Mg Tbec (Aspirin) .Marland Kitchen... Take 1 tablet by mouth once a day  Problem # 6:  OTHER MEDICAL PROBLEMS AS NOTED>>>  Complete Medication List: 1)  Mucinex 600 Mg Xr12h-tab (Guaifenesin) .... Take 1-2 tabs by mouth two times a day as needed for congestion 2)  Aspirin Adult Low Strength 81 Mg Tbec (Aspirin) .... Take 1 tablet by mouth once a day 3)  Amlodipine Besylate 10 Mg Tabs (Amlodipine besylate) .... Take 1 tab once daily.Marland KitchenMarland Kitchen 4)  Micardis Hct 80-25 Mg Tabs (Telmisartan-hctz) .... Take one tablet every day 5)  Klor-con M20 20 Meq Tbcr (Potassium chloride crys cr) .... Take 1 tablet by mouth once a day 6)  Simvastatin 40 Mg Tabs (Simvastatin) .... Take one tablet by mouth at  bedtime 7)  Fish Oil 1000 Mg Caps (Omega-3 fatty acids) .... Take 1 tablet by mouth once a day 8)  Garlic Oil 1000 Mg Caps (Garlic) .... Take 1 tablet by mouth once a day 9)  Zantac 150 Maximum Strength 150 Mg Tabs (Ranitidine hcl) .... Take 1 tab by mouth once daily as directed... 10)  Centrum Silver Tabs (Multiple vitamins-minerals) .... Take 1 tablet by mouth once a day  Other Orders: Prescription Created Electronically [phone])  Patient Instructions: 1)  Today we updated your med list- see below.... 2)  We refilled your meds for 2011... 3)  Today we did your follow up FASTING blood work... please call the "phone tree" in a few days for your lab results.Marland KitchenMarland Kitchen 4)  Stay as active as poss, and count those calories- try to lose 10 lbs... 5)  Call for any problems.Marland KitchenMarland Kitchen 6)  Please schedule a follow-up appointment in 6 months. Prescriptions: ZANTAC 150 MAXIMUM STRENGTH 150 MG TABS (RANITIDINE HCL) take 1 tab by mouth once daily as directed...  #30 x prn   Entered and Authorized by:   Michele Mcalpine MD   Signed by:   Michele Mcalpine MD on 11/15/2009   Method used:   Print then Give to Patient   RxID:   [id number] SIMVASTATIN 40 MG TABS (SIMVASTATIN) take one tablet by mouth at bedtime  #30 x prn   Entered and Authorized by:   Michele Mcalpine MD   Signed by:   Michele Mcalpine MD on 11/15/2009   Method used:   Print then Give to Patient   RxID:   [id number] KLOR-CON M20 20 MEQ  TBCR (POTASSIUM CHLORIDE CRYS CR) Take 1 tablet by mouth once a day  #30 x prn   Entered and Authorized by:   Michele Mcalpine MD   Signed by:   Michele Mcalpine MD on 11/15/2009   Method used:   Print then Give to Patient   RxID:   [id number] MICARDIS HCT 80-25 MG  TABS (TELMISARTAN-HCTZ) Take one tablet every day  #30 x prn   Entered and Authorized by:   Michele Mcalpine MD   Signed by:   Michele Mcalpine MD on 11/15/2009   Method used:   Print then Give to Patient   RxID:   [id number] AMLODIPINE BESYLATE 10 MG   TABS (AMLODIPINE BESYLATE) Take 1 tab once daily...  #30 x prn   Entered and Authorized by:   Michele Mcalpine MD   Signed by:  Michele Mcalpine MD on 11/15/2009   Method used:   Print then Give to Patient   RxID:   [id number]

## 2010-10-10 NOTE — Progress Notes (Signed)
Summary: drainage, dizziness-meds called in/pt aware  Phone Note Call from Patient Call back at Home Phone [phone]   Caller: Patient Call For: NADEL Summary of Call: pt c/o dizziness (since last phone msg 11/14). this continues to happen either when standing up or sitting down. last time pt checked his bp was 1 wk ago- 136 / 46. he says it has been running "normal". pt also c/o sinus drainage x 2 1/2 wks. throat is "irritated" but not sore. no fever. no N or V. pt has continued to take musinex. pt requests to see dr Kriste Basque.  Initial call taken by: Tivis Ringer, CNA,  August 09, 2010 11:14 AM  Follow-up for Phone Call        Called, spoke with pt.  he c/o milky color drainage, slight headache, slight sinus pressure x 2 1/2 wks.  Denies f/c/s and sore throat.  Also c/o dizziness at times for the same amount of time.  Dizziness can be either when standing or sitting.  States he does not change positions quickly.  Requesting SN's recs.  [facility] Allergies: ! TETRACYCLINE Dr. Kriste Basque, pls advise.  Thanks! Follow-up by: Gweneth Dimitri RN,  August 09, 2010 1:17 PM  Additional Follow-up for Phone Call Additional follow up Details #1::        per SN----use meclizine 25mg    1 by mouth every 4 hours as needed for dizziness # 50 and for the sinuses---augmentin 875mg    #14   1 by mouth two times a day  and sterapred dosepak   5mg     day pack as directed Randell Loop [facility]  August 09, 2010 4:51 PM     Additional Follow-up for Phone Call Additional follow up Details #2::    Rxs were called to pharm. Vernie Murders  August 09, 2010 5:00 PM  pt returned call .Marland KitchenChantel Bowne  August 10, 2010 8:51 AM   Pt aware. Zackery Barefoot CMA  August 10, 2010 9:02 AM   New/Updated Medications: * STERA PRED DOSEPACK 5 MG take as directed- 6 day pack MECLIZINE HCL 25 MG TABS (MECLIZINE HCL) 1 every 4 hrs as needed for dizziness AUGMENTIN 875-125 MG TABS (AMOXICILLIN-POT CLAVULANATE) 1 two  times a day until gone Prescriptions: AUGMENTIN 875-125 MG TABS (AMOXICILLIN-POT CLAVULANATE) 1 two times a day until gone  #14 x 0   Entered by:   Vernie Murders   Authorized by:   Michele Mcalpine MD   Signed by:   Vernie Murders on 08/09/2010   Method used:   Telephoned to ...       [facility]  [phone]* (retail)       [address] [place], [place]  [address]       Ph: [phone] or [phone]       Fax: [phone]   RxID:   [id number] MECLIZINE HCL 25 MG TABS (MECLIZINE HCL) 1 every 4 hrs as needed for dizziness  #50 x 0   Entered by:   Vernie Murders   Authorized by:   Michele Mcalpine MD   Signed by:   Vernie Murders on 08/09/2010   Method used:   Telephoned to ...       [facility]  [phone]* (retail)       [address] [place], [place]  [address]       Ph: [phone] or [phone]  Fax: [phone]   RxID:   [id number] STERA PRED DOSEPACK 5 MG take as directed- 6 day pack  #1 x 0   Entered by:   Vernie Murders   Authorized by:   Michele Mcalpine MD   Signed by:   Vernie Murders on 08/09/2010   Method used:   Telephoned to ...       [facility]  [phone]* (retail)       [address] [place], [place]  [address]       Ph: [phone] or [phone]       Fax: [phone]   RxID:   [id number]

## 2010-10-10 NOTE — Progress Notes (Signed)
Summary: sick stuffy nose and cough/cb  Phone Note Call from Patient Call back at Home Phone [phone]   Caller: Patient Call For: nadel Summary of Call: pt is wanting something for a stuffy nose, and cough [facility] Initial call taken by: Lacinda Axon,  June 14, 2010 2:53 PM  Follow-up for Phone Call        called and spoke with pt. pt was recently seen by SN 05/26/2010.  Pt believes he has a "cold."  Pt c/o head and chest congestion, coughing up small amounts of white sputum, clear nasal drainage, sneezing, body aches, PND, chest sore from coughing, and facial pressure.  Pt denied sore throat or fever.  Pt is taking Claritin 5mg  two times a day and Mucinex 1200mg  two times a day with no relief of symptoms.  Please advise.  Thanks.  Aundra Millet Reynolds LPN  June 14, 2010 3:23 PM Allergies: Tetracycline  Additional Follow-up for Phone Call Additional follow up Details #1::        per SN----ok for pt to have zpak #1  take as directed with no refills---otc zyrtec 10mg   otc   1 by mouth every morning--call in omnaris nasal spray  #1  2 sprays in each nostril two times a day  and use mucinex daily with plenty of fluids. thanks Randell Loop CMA  June 14, 2010 4:12 PM   pt advised and rx sent. Carron Curie CMA  June 14, 2010 4:31 PM     New/Updated Medications: ZITHROMAX Z-PAK 250 MG TABS (AZITHROMYCIN) as directed ZYRTEC ALLERGY 10 MG TABS (CETIRIZINE HCL) Take 1 tablet by mouth once a day in morning OMNARIS 50 MCG/ACT SUSP (CICLESONIDE) 2 sprays in each nostril twice daily Prescriptions: OMNARIS 50 MCG/ACT SUSP (CICLESONIDE) 2 sprays in each nostril twice daily  #1 x 0   Entered by:   Carron Curie CMA   Authorized by:   Michele Mcalpine MD   Signed by:   Carron Curie CMA on 06/14/2010   Method used:   Electronically to        [facility]  [phone]* (retail)       [address] [place], [place]  [address]       Ph: [phone] or  [phone]       Fax: [phone]   RxID:   [id number] ZYRTEC ALLERGY 10 MG TABS (CETIRIZINE HCL) Take 1 tablet by mouth once a day in morning  #30 x 0   Entered by:   Carron Curie CMA   Authorized by:   Michele Mcalpine MD   Signed by:   Carron Curie CMA on 06/14/2010   Method used:   Electronically to        [facility]  [phone]* (retail)       [address] [place], [place]  [address]       Ph: [phone] or [phone]       Fax: [phone]   RxID:   [id number] ZITHROMAX Z-PAK 250 MG TABS (AZITHROMYCIN) as directed  #1 pak x 0   Entered by:   Carron Curie CMA   Authorized by:   Michele Mcalpine MD   Signed by:   Carron Curie CMA on 06/14/2010   Method used:   Electronically to        [facility] [address]  [phone]* (retail)       [address] [place], [place]  [address]       Ph: [phone] or [phone]       Fax: [phone]   RxID:   [id number]

## 2010-11-01 ENCOUNTER — Telehealth (INDEPENDENT_AMBULATORY_CARE_PROVIDER_SITE_OTHER): Payer: Self-pay | Admitting: *Deleted

## 2010-11-07 NOTE — Progress Notes (Signed)
Summary: sinus - requesting rxs  Phone Note Call from Patient Call back at Home Phone [phone]   Caller: Patient Call For: nadel Summary of Call: patient phoned and would like a refill on his sinus medication Amoxtr-kclvor or a zpack  and [facility]nisone [facility]. He has a headache and drainage into his throat can chest. Patient has clear nasal discharge and coughing up a clear with a little yellow color. He can be reached at [phone] Initial call taken by: Vedia Coffer,  November 01, 2010 1:08 PM  Follow-up for Phone Call        Called, spoke with pt.  He c/o PND going into chest, runny nose and blowing nose with slimy, white mucus, sore throat, chest tightness, and prod cough with clear mucus with slight green tint at times.  Sxs started x 1 wk ago.  Denies f/c/s.    Allergies (verified): ! TETRACYCLINE [facility]  Last OV with Dr. Kriste Basque 05/2010 Pending OV with SN 11/22/2010  SN out of office - MR, pls advise.  Thanks!  Follow-up by: Gweneth Dimitri RN,  November 01, 2010 2:57 PM  Additional Follow-up for Phone Call Additional follow up Details #1::        pls give the refills as he has requested. give the augmenting and prednisone Additional Follow-up by: Kalman Shan MD,  November 01, 2010 4:21 PM    Additional Follow-up for Phone Call Additional follow up Details #2::    Spoke with pt and notified of the above per MR.  Rxs were sent to pharm.  Follow-up by: Vernie Murders,  November 01, 2010 4:26 PM  Prescriptions: STERA PRED DOSEPACK 5 MG take as directed- 6 day pack  #1 x 0   Entered by:   Vernie Murders   Authorized by:   Kalman Shan MD   Signed by:   Vernie Murders on 11/01/2010   Method used:   Telephoned to ...       [facility]  [phone]* (retail)       [address] [place], [place]  [address]       Ph: [phone] or [phone]       Fax: [phone]   RxID:   [id number] AUGMENTIN 875-125 MG TABS  (AMOXICILLIN-POT CLAVULANATE) 1 two times a day until gone  #14 x 0   Entered by:   Vernie Murders   Authorized by:   Kalman Shan MD   Signed by:   Vernie Murders on 11/01/2010   Method used:   Electronically to        [facility]  [phone]* (retail)       [address] [place], [place]  [address]       Ph: [phone] or [phone]       Fax: [phone]   RxID:   [id number]

## 2010-11-22 ENCOUNTER — Telehealth: Payer: Self-pay | Admitting: Pulmonary Disease

## 2010-11-22 ENCOUNTER — Ambulatory Visit (INDEPENDENT_AMBULATORY_CARE_PROVIDER_SITE_OTHER): Payer: BC Managed Care – PPO | Admitting: Pulmonary Disease

## 2010-11-22 ENCOUNTER — Encounter: Payer: Self-pay | Admitting: Pulmonary Disease

## 2010-11-22 DIAGNOSIS — R7309 Other abnormal glucose: Secondary | ICD-10-CM

## 2010-11-22 DIAGNOSIS — M1990 Unspecified osteoarthritis, unspecified site: Secondary | ICD-10-CM

## 2010-11-22 DIAGNOSIS — I10 Essential (primary) hypertension: Secondary | ICD-10-CM

## 2010-11-22 DIAGNOSIS — K649 Unspecified hemorrhoids: Secondary | ICD-10-CM

## 2010-11-22 DIAGNOSIS — J309 Allergic rhinitis, unspecified: Secondary | ICD-10-CM

## 2010-11-22 DIAGNOSIS — E7800 Pure hypercholesterolemia, unspecified: Secondary | ICD-10-CM

## 2010-11-22 DIAGNOSIS — I739 Peripheral vascular disease, unspecified: Secondary | ICD-10-CM

## 2010-11-22 DIAGNOSIS — K219 Gastro-esophageal reflux disease without esophagitis: Secondary | ICD-10-CM

## 2010-11-23 ENCOUNTER — Other Ambulatory Visit: Payer: BC Managed Care – PPO

## 2010-11-23 ENCOUNTER — Other Ambulatory Visit: Payer: Self-pay | Admitting: Pulmonary Disease

## 2010-11-23 ENCOUNTER — Encounter (INDEPENDENT_AMBULATORY_CARE_PROVIDER_SITE_OTHER): Payer: Self-pay | Admitting: *Deleted

## 2010-11-23 DIAGNOSIS — I10 Essential (primary) hypertension: Secondary | ICD-10-CM

## 2010-11-23 DIAGNOSIS — E7800 Pure hypercholesterolemia, unspecified: Secondary | ICD-10-CM

## 2010-11-23 DIAGNOSIS — D649 Anemia, unspecified: Secondary | ICD-10-CM

## 2010-11-23 DIAGNOSIS — Z125 Encounter for screening for malignant neoplasm of prostate: Secondary | ICD-10-CM

## 2010-11-23 DIAGNOSIS — E039 Hypothyroidism, unspecified: Secondary | ICD-10-CM

## 2010-11-23 DIAGNOSIS — R7309 Other abnormal glucose: Secondary | ICD-10-CM

## 2010-11-23 DIAGNOSIS — R748 Abnormal levels of other serum enzymes: Secondary | ICD-10-CM

## 2010-11-23 DIAGNOSIS — E785 Hyperlipidemia, unspecified: Secondary | ICD-10-CM

## 2010-11-23 LAB — CBC WITH DIFFERENTIAL/PLATELET
Basophils Absolute: 0 10*3/uL (ref 0.0–0.1)
Eosinophils Relative: 6.9 % — ABNORMAL HIGH (ref 0.0–5.0)
HCT: 41 % (ref 39.0–52.0)
Hemoglobin: 13.4 g/dL (ref 13.0–17.0)
Lymphocytes Relative: 29.8 % (ref 12.0–46.0)
Lymphs Abs: 1.4 10*3/uL (ref 0.7–4.0)
Monocytes Relative: 19.3 % — ABNORMAL HIGH (ref 3.0–12.0)
Neutro Abs: 2.1 10*3/uL (ref 1.4–7.7)
RDW: 15.8 % — ABNORMAL HIGH (ref 11.5–14.6)
WBC: 4.8 10*3/uL (ref 4.5–10.5)

## 2010-11-23 LAB — BASIC METABOLIC PANEL
BUN: 14 mg/dL (ref 6–23)
CO2: 28 mEq/L (ref 19–32)
Calcium: 9 mg/dL (ref 8.4–10.5)
Creatinine, Ser: 1 mg/dL (ref 0.4–1.5)
GFR: 88.3 mL/min (ref >60.00)
Glucose, Bld: 96 mg/dL (ref 70–99)

## 2010-11-23 LAB — LIPID PANEL
Cholesterol: 229 mg/dL — ABNORMAL HIGH (ref 0–200)
HDL: 67.1 mg/dL (ref >39.00)
Triglycerides: 51 mg/dL (ref 0.0–149.0)

## 2010-11-23 LAB — HEPATIC FUNCTION PANEL
Albumin: 3.9 g/dL (ref 3.5–5.2)
Total Protein: 6.5 g/dL (ref 6.0–8.3)

## 2010-11-23 LAB — PSA: PSA: 0.76 ng/mL (ref 0.10–4.00)

## 2010-11-23 LAB — LDL CHOLESTEROL, DIRECT: Direct LDL: 149.6 mg/dL

## 2010-11-23 LAB — TSH: TSH: 1.95 u[IU]/mL (ref 0.35–5.50)

## 2010-11-28 NOTE — Progress Notes (Signed)
Summary: Pt has apt at 10:30 Wife concerned about allergies FYI  Phone Note Call from Patient   Caller: SPOUSE/BARBARA Call For: NADEL Summary of Call: patients wife Britta Mccreedy phoned stated that [name] has an appt this morning with Dr Kriste Basque and she stated that the family is concerned becasue most of the year he has had several rounds of antibiotics, mucinex and other decongestion medication and she wants to know if he could go on something that would help him so that they can travel. She stated that her daughter and grandchild are on singular and she wants to know this would possibly help him. she can be reached at [phone] or [phone] Initial call taken by: Vedia Coffer,  November 22, 2010 9:18 AM  Follow-up for Phone Call        Called and spoke with Britta Mccreedy and she states she just wanted to send this to Dr. Kriste Basque as an FYI in case patient forgets to mention this to SN at his apt. Pt wife states the pt has been having a hard time with allergies and she just wanted to see if Dr. Kriste Basque could put him on some type of medicine to help him. Will foward to SN for FYI for when pt comes in at 10:30  [facility]  November 22, 2010 9:25 AM   Additional Follow-up for Phone Call Additional follow up Details #1::        SN is aware and started pt on singulair today at [facility] Randell Loop CMA  November 22, 2010 11:44 AM

## 2010-11-30 ENCOUNTER — Other Ambulatory Visit: Payer: Self-pay | Admitting: Pulmonary Disease

## 2010-12-05 ENCOUNTER — Other Ambulatory Visit (HOSPITAL_COMMUNITY): Payer: Self-pay | Admitting: Pulmonary Disease

## 2010-12-05 ENCOUNTER — Ambulatory Visit (HOSPITAL_COMMUNITY): Payer: Medicare Other | Attending: Pulmonary Disease

## 2010-12-05 ENCOUNTER — Other Ambulatory Visit: Payer: Self-pay | Admitting: Pulmonary Disease

## 2010-12-05 DIAGNOSIS — R011 Cardiac murmur, unspecified: Secondary | ICD-10-CM

## 2010-12-05 DIAGNOSIS — I10 Essential (primary) hypertension: Secondary | ICD-10-CM | POA: Insufficient documentation

## 2010-12-05 DIAGNOSIS — E785 Hyperlipidemia, unspecified: Secondary | ICD-10-CM | POA: Insufficient documentation

## 2010-12-05 DIAGNOSIS — I739 Peripheral vascular disease, unspecified: Secondary | ICD-10-CM | POA: Insufficient documentation

## 2010-12-05 DIAGNOSIS — E119 Type 2 diabetes mellitus without complications: Secondary | ICD-10-CM | POA: Insufficient documentation

## 2010-12-05 DIAGNOSIS — K219 Gastro-esophageal reflux disease without esophagitis: Secondary | ICD-10-CM | POA: Insufficient documentation

## 2010-12-12 ENCOUNTER — Other Ambulatory Visit: Payer: Self-pay | Admitting: Pulmonary Disease

## 2010-12-12 DIAGNOSIS — R011 Cardiac murmur, unspecified: Secondary | ICD-10-CM

## 2010-12-12 DIAGNOSIS — I10 Essential (primary) hypertension: Secondary | ICD-10-CM

## 2010-12-12 NOTE — Assessment & Plan Note (Signed)
Summary: 6 month rov   CC:  6 month ROV & review of mult medical problems....  History of Present Illness: 77 y/o BM here for a follow up visit... he has mult med problems as noted below...   ~  November 15, 2009:  he continues stable- no new complaints or concerns...  BP controlled on meds;  FLP improved w/ Simva40;  DM stable on diet alone- weight 181# & needs better diet & exercise program!   ~  May 26, 2010:  another good 22mo- no new complaints or concerns... BP controlled on meds;  TChol & LDL are not at goal on the Simva40, and since he is on Norvasc as well we will have to switch to Lipitor;  BS remains adeq on diet alone;  he exercises w/ yard work & weight is down 3# to 178# today... OK 2011 Flu vaccine.   ~  November 22, 2010:  22mo ROV c/o continued sinus problems despite recent eval by DrShoemaker/ ENT... family called & wanted [name] on Singulair (like the grandkids) so they can travel; we reviewed regimen w/ antihist, mucinex, saline, nasonex...    HBP>  controlled on Amlodipine, Micardis, KCl- BP= 140/70 & similar at home; denies CP, palpit, SOB, etc; exam today shows sys murmur & we discussed checking 2DEcho for further eval...    Chol>  now on Lip40 but not at goal w/ FLP showing TChol 229, TG 51, HDL 67, LDL 150; needs better diet, get wt down, take med every day!    DM>  on diet alone & BS 96, A1c 7/1; told he will need medication if he can't get wt down.      Current Problem List:  ALLERGIC RHINITIS (ICD-477.9) - he uses OTC Zyrtek & Mucinex, and knows to avoid pseudophed...  HYPERTENSION (ICD-401.9) - on NORVASC 10mg /d,  MICARDIS/Hct 80-25 daily,  & K20/d...   ~  NuclearStressTest 4/05 was norm- no infarct or iscemia, EF=72%...  ~  BP today = 116/64, feeling well, taking meds regularly, no reported side-effects... home checks are all good per pt- "even better than here"... denies HA, fatigue, visual changes, CP, palipit, dizziness, syncope, dyspnea, edema,  etc...  PERIPHERAL VASCULAR DISEASE (ICD-443.9) - on ASA 81mg /d... no leg pain or claudic symptoms.  ~  ABI's 2005 showed .92R & .78L...  HYPERCHOLESTEROLEMIA (ICD-272.0) - on SIMVASTATIN 40mg /d + FISH OIL & Garlic pills...  ~  FLP 8/08 on Simva20 showed TChol 199, TG 33, HDL 81, LDL 111  ~  FLP 3/09 on Simva20 showed TChol 211, TG 37, HDL 91, LDL 110... great HDL!!!  ~  FLP 9/09 on Simva20 showed TChol 190, TG 34, HDL 77, LDL 107  ~  FLP 3/10 on Simva20 showed TChol 238, Tg 35, HDL 76, LDL 138... rec> incr Simva40.  ~  FLP 9/10 on Simva40 showed TChol 206, TG 27, HDL 81, LDL 108  ~  FLP 3/11 on Simva40 showed TChol 189, TG 32, HDL 83, LDL 98  ~  FLP 9/11 on Simva40 showed TChol 222, TG 33, HDL 76, LDL 136... not at goal, rec> ch to Lipitor40.  DIABETES MELLITUS, BORDERLINE (ICD-790.29) - on diet alone & we discussed diet + exercise program.  ~  labs 8/08 showed BS=107, HgA1c=6.4 on diet alone... rec to lose some weight.  ~  labs 3/09 (wt=177#)showed BS= 94, HgA1c= 6.6.Marland KitchenMarland Kitchen rec- better diet efforts...  ~  labs 9/09 (wt=176#) showed BS= 101, HgA1c= 6.6  ~  labs 3/10 (wt=181#) showed  BS= 108, A1c= 6.7  ~  labs 9/10 (wt=181#) showed BS= 107, A1c= 6.6  ~  labs 3/11 (wt=181#) showed BS= 103  ~  labs 9/11 (wt=178##) showed BS= 96, A1c 6.9  GERD (ICD-530.81) - he takes Zantac 150mg /d as needed...  HEMORRHOIDS (ICD-455.6) - last colonoscopy was 4/04 by DrPatterson and norm x for hems...  PROSTATITIS, HX OF (ICD-V13.09) - followed by DrDahlstadt & seen 9/09- note reviewed... he has BPH,  renal cyst, ED... Rx w/ Viagra as needed.  ~  labs 3/09 showed PSA= 0.69  ~  labs 3/10 showed PSA= 0.66  ~  labs 3/11 showed PSA= 0.69  DEGENERATIVE JOINT DISEASE (ICD-715.90)  HEALTH MAINTENANCE - he gets the yearly seasonal flu vaccines... [facility]age 89[facility]... had Tetanus shot in ER  ~2006...   Preventive Screening-Counseling & Management  Alcohol-Tobacco     Smoking  Status: never  Allergies: 1)  ! Tetracycline  Comments:  Nurse/Medical Assistant: The patient's medications and allergies were reviewed with the patient and were updated in the Medication and Allergy Lists.  Past History:  Past Medical History: ALLERGIC RHINITIS (ICD-477.9) HYPERTENSION (ICD-401.9) PERIPHERAL VASCULAR DISEASE (ICD-443.9) HYPERCHOLESTEROLEMIA (ICD-272.0) DIABETES MELLITUS, BORDERLINE (ICD-790.29) GERD (ICD-530.81) HEMORRHOIDS (ICD-455.6) PROSTATITIS, HX OF (ICD-V13.09) DEGENERATIVE JOINT DISEASE (ICD-715.90)  Past Surgical History: S/P wisdom teeth surg S/P inguinal hernia repair 1989 S/P bilat CTS by DrSypher  Family History: Reviewed history and no changes required.  Social History: Reviewed history from 05/26/2010 and no changes required. non smoker no etoh retired  Review of Systems      See HPI       The patient complains of dyspnea on exertion.  The patient denies anorexia, fever, weight loss, weight gain, vision loss, decreased hearing, hoarseness, chest pain, syncope, peripheral edema, prolonged cough, headaches, hemoptysis, abdominal pain, melena, hematochezia, severe indigestion/heartburn, hematuria, incontinence, muscle weakness, suspicious skin lesions, transient blindness, difficulty walking, depression, unusual weight change, abnormal bleeding, enlarged lymph nodes, and angioedema.    Vital Signs:  Patient profile:   77 year old male Height:      65 inches Weight:      182 pounds O2 Sat:      98 % on Room air Temp:     98.0 degrees F oral Pulse rate:   79 / minute BP sitting:   140 / 70  (left arm) Cuff size:   regular  Vitals Entered By: Randell Loop CMA (November 22, 2010 10:19 AM)  O2 Sat at Rest %:  98 O2 Flow:  Room air CC: 6 month ROV & review of mult medical problems... Is Patient Diabetic? No Pain Assessment Patient in pain? yes      Onset of pain  facial pain Comments meds updated today with pt   Physical  Exam  Additional Exam:  WD, Overweight, 77 y/o BM in NAD... GENERAL:  Alert & oriented; pleasant & cooperative... HEENT:  [place]/AT, EOM-wnl, PERRLA, EACs-clear, TMs-wnl, NOSE-clear, THROAT-clear & wnl. NECK:  Supple w/ fair ROM; no JVD; normal carotid impulses w/o bruits; no thyromegaly or nodules palpated; no lymphadenopathy. CHEST:  Clear to P & A; without wheezes/ rales/ or rhonchi. HEART:  Regular Rhythm; without murmurs/ rubs/ or gallops. ABDOMEN:  Soft & nontender; normal bowel sounds; no organomegaly or masses detected. EXT: without deformities, mild arthritic changes; no varicose veins/ +venous insuffic/ no edema. NEURO:  CN's intact; motor testing normal; sensory testing normal; gait normal & balance OK. DERM:  No lesions noted;  no rash etc...    Impression & Recommendations:  Problem # 1:  ALLERGIC RHINITIS (ICD-477.9) We discussed sinus regimen including antihist, mucinex, saline, nasonex... he will f/u w/ DrShoemaker... OK Singulair trial. His updated medication list for this problem includes:    Zyrtec Allergy 10 Mg Tabs (Cetirizine hcl) .Marland Kitchen... Take 1 tablet by mouth once a day in morning    Nasonex 50 Mcg/act Susp (Mometasone furoate) .Marland Kitchen... 1-2 sprays in each nostril at bedtime  Problem # 2:  HYPERTENSION (ICD-401.9) Controlled on meds>  continue same, check 2DEcho. His updated medication list for this problem includes:    Amlodipine Besylate 10 Mg Tabs (Amlodipine besylate) .Marland Kitchen... Take 1 tab once daily...    Micardis Hct 80-25 Mg Tabs (Telmisartan-hctz) .Marland Kitchen... Take one tablet every day  Orders: 2 D Echo (2 D Echo)  Problem # 3:  HYPERCHOLESTEROLEMIA (ICD-272.0) FLP still not at goal> ?taking regularly & asked to bring his bottles to the OVs, discussed diet + exercise... His updated medication list for this problem includes:    Lipitor 40 Mg Tabs (Atorvastatin calcium) .Marland Kitchen... Take 1 tablet by mouth once a day  Problem # 4:  DIABETES MELLITUS, BORDERLINE (ICD-790.29) A1c  is now 7.1 & prob needs meds but he wants to control w/ diet alone... given last chance to get wt down & A1c <7...  Problem # 5:  OTHER MEDICAL PROBLEMS AS NOTED>>>  Complete Medication List: 1)  Zyrtec Allergy 10 Mg Tabs (Cetirizine hcl) .... Take 1 tablet by mouth once a day in morning 2)  Nasonex 50 Mcg/act Susp (Mometasone furoate) .Marland Kitchen.. 1-2 sprays in each nostril at bedtime 3)  Mucinex 600 Mg Xr12h-tab (Guaifenesin) .... Take 1-2 tabs by mouth two times a day as needed for congestion 4)  Aspirin Adult Low Strength 81 Mg Tbec (Aspirin) .... Take 1 tablet by mouth once a day 5)  Amlodipine Besylate 10 Mg Tabs (Amlodipine besylate) .... Take 1 tab once daily.Marland KitchenMarland Kitchen 6)  Micardis Hct 80-25 Mg Tabs (Telmisartan-hctz) .... Take one tablet every day 7)  Klor-con M20 20 Meq Tbcr (Potassium chloride crys cr) .... Take 1 tablet by mouth once a day 8)  Lipitor 40 Mg Tabs (Atorvastatin calcium) .... Take 1 tablet by mouth once a day 9)  Fish Oil 1000 Mg Caps (Omega-3 fatty acids) .... Take 1 tablet by mouth once a day 10)  Garlic Oil 1000 Mg Caps (Garlic) .... Take 1 tablet by mouth once a day 11)  Zantac 150 Maximum Strength 150 Mg Tabs (Ranitidine hcl) .... Take 1 tab by mouth once daily as directed... 12)  Centrum Silver Tabs (Multiple vitamins-minerals) .... Take 1 tablet by mouth once a day 13)  Singulair 10 Mg Tabs (Montelukast sodium) .... Take 1 tab by mouth once daily...  Patient Instructions: 1)  Today we updated your med list- see below.... 2)  We refilled your meds for 2012... 3)  FOR YOUR SINUSES:  we will arrange for an ENT consult w/ a sinus specialist for further eval>> in the meanwhile: 4)  AM = ZYRTEK each AM 5)  DAYTIME = MUCINEX 1200mg  twice daily w/ lots of fluids;  use the SALINE nasal mist or the Netti pot frequently during the day to wash out your sinuses;  and we will perscribe SINGULAIR to take one daily in the mid-day time.Marland KitchenMarland Kitchen 6)  NIGHTTIME = spray the NASONEX 2sp in each  nostril at bedtime.Marland KitchenMarland Kitchen 7)  Call for any questions... 8)  We are also going to set  up an ECHOcardiogram to check your heart (this is done at the [place] office on [facility])... Prescriptions: ZANTAC 150 MAXIMUM STRENGTH 150 MG TABS (RANITIDINE HCL) take 1 tab by mouth once daily as directed...  #30 x 12   Entered and Authorized by:   Michele Mcalpine MD   Signed by:   Michele Mcalpine MD on 11/22/2010   Method used:   Print then Give to Patient   RxID:   [id number] LIPITOR 40 MG TABS (ATORVASTATIN CALCIUM) Take 1 tablet by mouth once a day  #30 x 12   Entered and Authorized by:   Michele Mcalpine MD   Signed by:   Michele Mcalpine MD on 11/22/2010   Method used:   Print then Give to Patient   RxID:   [id number] KLOR-CON M20 20 MEQ  TBCR (POTASSIUM CHLORIDE CRYS CR) Take 1 tablet by mouth once a day  #30 x 12   Entered and Authorized by:   Michele Mcalpine MD   Signed by:   Michele Mcalpine MD on 11/22/2010   Method used:   Print then Give to Patient   RxID:   [id number] MICARDIS HCT 80-25 MG  TABS (TELMISARTAN-HCTZ) Take one tablet every day  #30 x 12   Entered and Authorized by:   Michele Mcalpine MD   Signed by:   Michele Mcalpine MD on 11/22/2010   Method used:   Print then Give to Patient   RxID:   [id number] AMLODIPINE BESYLATE 10 MG  TABS (AMLODIPINE BESYLATE) Take 1 tab once daily...  #30 x 12   Entered and Authorized by:   Michele Mcalpine MD   Signed by:   Michele Mcalpine MD on 11/22/2010   Method used:   Print then Give to Patient   RxID:   [id number] NASONEX 50 MCG/ACT SUSP (MOMETASONE FUROATE) 1-2 sprays in each nostril at bedtime  #1 x 12   Entered and Authorized by:   Michele Mcalpine MD   Signed by:   Michele Mcalpine MD on 11/22/2010   Method used:   Print then Give to Patient   RxID:   [id number] ZYRTEC ALLERGY 10 MG TABS (CETIRIZINE HCL) Take 1 tablet by mouth once a day in morning  #30 x 12   Entered and Authorized by:   Michele Mcalpine MD   Signed by:   Michele Mcalpine MD on 11/22/2010   Method used:   Print then Give to Patient   RxID:   [id number] SINGULAIR 10 MG TABS (MONTELUKAST SODIUM) take 1 tab by mouth once daily...  #30 x 12   Entered and Authorized by:   Michele Mcalpine MD   Signed by:   Michele Mcalpine MD on 11/22/2010   Method used:   Print then Give to Patient   RxID:   [id number]    Immunization History:  Influenza Immunization History:    Influenza:  historical (06/27/2010)

## 2011-01-05 ENCOUNTER — Encounter: Payer: Self-pay | Admitting: Cardiology

## 2011-01-08 ENCOUNTER — Ambulatory Visit (INDEPENDENT_AMBULATORY_CARE_PROVIDER_SITE_OTHER): Payer: BC Managed Care – PPO | Admitting: Cardiology

## 2011-01-08 ENCOUNTER — Encounter: Payer: Self-pay | Admitting: Cardiology

## 2011-01-08 DIAGNOSIS — I739 Peripheral vascular disease, unspecified: Secondary | ICD-10-CM

## 2011-01-08 DIAGNOSIS — R0989 Other specified symptoms and signs involving the circulatory and respiratory systems: Secondary | ICD-10-CM

## 2011-01-08 DIAGNOSIS — I359 Nonrheumatic aortic valve disorder, unspecified: Secondary | ICD-10-CM

## 2011-01-08 DIAGNOSIS — E7800 Pure hypercholesterolemia, unspecified: Secondary | ICD-10-CM

## 2011-01-08 DIAGNOSIS — I350 Nonrheumatic aortic (valve) stenosis: Secondary | ICD-10-CM | POA: Insufficient documentation

## 2011-01-08 DIAGNOSIS — I10 Essential (primary) hypertension: Secondary | ICD-10-CM

## 2011-01-08 NOTE — Patient Instructions (Signed)
Schedule an appointment for a lower extremity arterial doppler.  Schedule an appointment for a carotid doppler.  Take and record your blood pressure. I will call you in about 2 weeks and get the readings. Katina Dung, RN.  Schedule an appointment for fasting lab the first of June--lipid profile/liver profile  424.1  401.9   Schedule an appointment to see Dr Shirlee Latch in June a few days after you have the fasting lab.

## 2011-01-08 NOTE — Progress Notes (Signed)
PCP: Dr. Kriste Basque  77 yo with HTN, diet-controlled diabetes, hyperlipidemia, and PAD presents for cardiology evaluation after recent echo showed mild aortic stenosis.  Patient was seen by Dr. Kriste Basque and noted to have a heart murmur.  Echo showed normal LV systolic function, mild LV hypertrophy, and mild aortic stenosis.  Patient symptomatically does quite well.  He denies exertional dyspnea or chest pain.  He is active and push mows his yard.  He does get some left calf and thigh tightness with heavy exertion and had ABIs done in 2005 that showed a low ABI on the left.  Patient's BP is 155/77 today.  At home, he says it runs in the systolic 130-140 range.  His diet is fairly high in fat and sodium.  His LDL was 150 when last checked in 3/12, but he states that he was not regularly taking his Lipitor at that time.  Since then, he has been taking Lipitor regularly.    ECG: NSR, nonspecific T wave flattening  Labs (3/12): K 3.9, creatinine 1.0, TSH normal, LDL 150, HDL 76  PMH: 1. HTN 2. Hyperlipidemia 3. Diabetes mellitus: diet-controlled 4. Allergic rhinitis 5. Myoview (4/05): no ischemia or infarction. 6. PAD: ABIs 2005 with 0.92 on right, 0.78 on left.  7. GERD 8. Prostatitis 9. S/p inguinal hernia repair. 10. Aortic stenosis: Mild by echo.  Echo (3/12) with mild LVH, mild to moderate focal basal septal hypertrophy with no LV outflow tract gradient, mild aortic stenosis, normal RV size and systolic function.   SH: Retired, married, never smoked.    FH: Father with CVA in his 47s, no premature CAD, no AAA  ROS: All systems reviewed and negative except as per HPI.   Current Outpatient Prescriptions  Medication Sig Dispense Refill  . amLODipine (NORVASC) 10 MG tablet TAKE 1 TABLET EVERY DAY  30 tablet  4  . aspirin 81 MG tablet Take 81 mg by mouth daily.        Marland Kitchen atorvastatin (LIPITOR) 40 MG tablet Take 40 mg by mouth daily.        . Garlic Oil 1000 MG CAPS Take 1 capsule by mouth daily.         Marland Kitchen guaiFENesin (MUCINEX) 600 MG 12 hr tablet Take 1,200 mg by mouth as needed.        Marland Kitchen MICARDIS HCT 80-25 MG per tablet TAKE 1 TABLET EVERY DAY  30 tablet  4  . mometasone (NASONEX) 50 MCG/ACT nasal spray 2 sprays by Nasal route daily.        . Multiple Vitamins-Minerals (CENTRUM SILVER) tablet Take 1 tablet by mouth daily.        . Omega-3 Fatty Acids (FISH OIL) 1000 MG CAPS Take 1 capsule by mouth daily.        . potassium chloride (KLOR-CON) 20 MEQ packet Take 20 mEq by mouth daily.        . ranitidine (ZANTAC) 150 MG capsule Take 150 mg by mouth as needed.       Marland Kitchen DISCONTD: montelukast (SINGULAIR) 10 MG tablet Take 10 mg by mouth at bedtime.          BP 155/77  Pulse 80  Resp 14  Ht 5\' 5"  (1.651 m)  Wt 183 lb (83.008 kg)  BMI 30.45 kg/m2 General: NAD Neck: No JVD, no thyromegaly or thyroid nodule.  Lungs: Clear to auscultation bilaterally with normal respiratory effort. CV: Nondisplaced PMI.  Heart regular S1/S2, no S3/S4, 3/6 systolic crescendo-decrescendo murmur RUSB with  clear S2.  No peripheral edema.  Soft carotid bruit L>R versus referred murmur from AS.  1+ left PT pulse, difficult to palpate R PT pulse.  Abdomen: Soft, nontender, no hepatosplenomegaly, no distention.  Skin: Intact without lesions or rashes.  Neurologic: Alert and oriented x 3.  Psych: Normal affect. Extremities: No clubbing or cyanosis.  HEENT: Normal.

## 2011-01-08 NOTE — Assessment & Plan Note (Signed)
Patient had a decreased ABI on the left in 2005.  Interestingly, today the PT pulse on the right is weaker.  He does get tightness in his left calf and left thigh with heavy exertion.  I will do a peripheral arterial doppler evaluation to assess for degree of PAD.

## 2011-01-08 NOTE — Assessment & Plan Note (Signed)
BP is too high but per the patient it runs better at home.  He will try to cut back on his dietary sodium intake and will check his BP daily at home.  We will call him in 2 wks to see what it has been running at home.

## 2011-01-08 NOTE — Assessment & Plan Note (Signed)
Mild aortic stenosis by echo in 3/12.  It is possible that statin use may decrease progression (though not definitive).  Would aim for good BP control and statin use.  Repeat echo in a couple of years for progression.

## 2011-01-08 NOTE — Assessment & Plan Note (Signed)
Excellent HDL but LDL elevated to 150.  Patient has known PAD as well as significant risk factors for vascular disease (HTN, diabetes-though diet controlled, age, gender, elevated LDL).  I would like to see his LDL < 70 if possible.  He is now taking Lipitor regularly.  We talked for a while about lowering the fats/fried foods in his diet.  Will repeat lipids/LFTs in early 6/12.  If LDL is still above goal, which I suspect it will be, I will either increase Lipitor or change to Crestor.

## 2011-01-23 ENCOUNTER — Telehealth: Payer: Self-pay | Admitting: *Deleted

## 2011-01-23 NOTE — Telephone Encounter (Signed)
HYPERTENSION - Marca Ancona, MD 01/08/2011 11:19 AM Signed  BP is too high but per the patient it runs better at home. He will try to cut back on his dietary sodium intake and will check his BP daily at home. We will call him in 2 wks to see what it has been running at home.   I talked with pt. Recent BP readings given to me by pt---01/09/11 136/77  01/10/11 145/72 (no meds)   01/11/11 131/61  01/12/11 133/69   01/14/11 136/68 01/16/11 121/65  01/17/11 119/59   01/18/11 132/67   01/22/11 115/55. Pt states he feeling OK. I will forward to Dr Shirlee Latch for review.

## 2011-01-24 NOTE — Telephone Encounter (Signed)
Pt is aware Dr Shirlee Latch reviewed BP readings.

## 2011-01-24 NOTE — Telephone Encounter (Signed)
BP is ok 

## 2011-01-26 NOTE — Op Note (Signed)
NAME:  Ronnie Boyd, Ronnie Boyd NO.:  [id number]   MEDICAL RECORD NO.:  [id number]          PATIENT TYPE:  AMB   LOCATION:  [facility]SC                          FACILITY:  [facility]   PHYSICIAN:  Katy Fitch. Sypher, M.D. DATE OF BIRTH:  08/04/1928   DATE OF PROCEDURE:  09/20/2005  DATE OF DISCHARGE:                                 OPERATIVE REPORT   PREOP DIAGNOSIS:  Severe left carpal tunnel syndrome with unrecordable motor  and sensory latencies.   POSTOP DIAGNOSIS:  Severe left carpal tunnel syndrome with unrecordable  motor and sensory latencies.   OPERATION:  Release of left transverse carpal ligament.   OPERATING SURGEON:  Katy Fitch. Sypher, M.D.   ASSISTANT:  Molly Maduro Dasnoit PA-C.   ANESTHESIA:  General by LMA, supervising anesthesiologist is Dr. Gypsy Balsam.   INDICATIONS:  Ronnie Boyd is a 77 year old gentleman referred for  evaluation and management of a numb and painful left hand. He is noted to  have dystrophic changes with severe finger stiffness, thenar atrophy and  loss of sensibility in the median distribution.   Electrodiagnostic studies were completed by Dr. Johna Roles, which revealed  severe left carpal tunnel syndrome and moderate right carpal tunnel  syndrome.   After informed consent,  [name] was brought to the operating room at this  time for release of his left transverse carpal ligament.   PROCEDURE:  [name] was brought to the operating room and placed in  supine position on the table.   Following the induction of general anesthesia by LMA technique, the left arm  was prepped with Betadine soap solution and sterilely draped. A pneumatic  tourniquet was applied to proximal brachium.   Following exsanguination of the limb with Esmarch bandage, an arterial  tourniquet on the proximal brachium was inflated to 220 mmHg.   Procedure commenced with a short incision in the line of the ring finger in  the palm. The subcutaneous tissues were carefully  divided to reveal the  palmar fascia.  This was split longitudinally to reveal the common sensory  branch of the median nerve. The superficial palmar arch was gently  identified and protected. The transverse carpal ligament released along its  ulnar border with scissors extending into the distal forearm. This widely  opened carpal canal. The tenosynovium of the ulnar bursa was noted be very  thickened and fibrotic.   After the wound was explored and the ligament noted to be completely  released. Bleeding points were cauterized with bipolar current followed by  repair of the skin with intradermal 3-0 Prolene suture.   A compressive dressing was applied with a volar plaster splint maintaining  the wrist in 5 degrees of dorsiflexion.   For aftercare [name] is provided a prescription for Vicodin 5 milligrams  one to two tablets p.o. every four to six hours as needed for pain, 20  tablets without refill.   He will return to our office for follow-up in one week.      Katy Fitch Sypher, M.D.  Electronically Signed     RVS/MEDQ  D:  09/20/2005  T:  09/21/2005  Job:  [id number]

## 2011-01-26 NOTE — Op Note (Signed)
NAME:  Ronnie Boyd, [place] NO.:  [id number]   MEDICAL RECORD NO.:  [id number]          PATIENT TYPE:  AMB   LOCATION:  [facility]SC                          FACILITY:  [facility]   PHYSICIAN:  Katy Fitch. Sypher, M.D. DATE OF BIRTH:  08/04/1928   DATE OF PROCEDURE:  05/02/2006  DATE OF DISCHARGE:                                 OPERATIVE REPORT   PREOPERATIVE DIAGNOSIS:  Severe right carpal tunnel syndrome.   POSTOPERATIVE DIAGNOSIS:  Severe right carpal tunnel syndrome.   OPERATION:  Release of right transverse carpal ligament.   OPERATIONS:  Dr. Josephine Igo.   ASSISTANT:  Annye Rusk, PA-C.   ANESTHESIA:  General by LMA.  Supervising anesthesiologist Dr. Sampson Goon.   INDICATIONS:  [name] is a 77 year old gentleman, referred for  bilateral severe carpal tunnel syndrome.  Electrodiagnostic studies  confirmed very significant median neuropathy bilaterally.   He is status post left carpal tunnel release with good result.  He now  presents for right carpal tunnel release.   PROCEDURE:  [name] was brought to the operating room and placed in  supine position on the operating room table.  Following induction general  anesthesia by LMA technique, the right arm was prepped with Betadine soap  solution and sterilely draped.  Following exsanguination of the limb with  Esmarch bandage, arterial tourniquet was inflated to 220 mmHg.  Procedure  commenced with short incision in line with the ring finger and the palm.  Subcutaneous tissues were carefully divided, revealing the palmar fascia.  This was splint longitudinally to reveal the common sensory branch of the  median nerve.  These were followed back to the transverse carpal ligament,  which was carefully isolated from the median nerve.  The ligament was  released along its ulnar border, extending into the distal forearm.  The  contents of the carpal canal revealed a fibrotic bursa.  The nerve was quite  edematous and  hyperemic at the distal margin of the transverse carpal  ligament.  The wound was then repaired with intradermal 3-0 Prolene suture.   A compressive dressing was applied with a volar plaster splint, maintaining  the wrist in 5 degrees of dorsiflexion.  For aftercare he is advised to  elevate his hand for 48 hours.  He will work on range of motion excises.  He  is discharged with a prescription for Vicodin 5 mg p.r.n. one p.o. 4-6 pain,  20 tablets without refill.  Katy Fitch Sypher, M.D.  Electronically Signed     RVS/MEDQ  D:  05/02/2006  T:  05/02/2006  Job:  [id number]   cc:   Katy Fitch. Sypher, M.D.  Lonzo Cloud. Kriste Basque, MD

## 2011-01-30 ENCOUNTER — Encounter: Payer: BC Managed Care – PPO | Admitting: *Deleted

## 2011-01-30 ENCOUNTER — Encounter (INDEPENDENT_AMBULATORY_CARE_PROVIDER_SITE_OTHER): Payer: Medicare Other | Admitting: *Deleted

## 2011-01-30 ENCOUNTER — Other Ambulatory Visit: Payer: Self-pay | Admitting: *Deleted

## 2011-01-30 DIAGNOSIS — I6529 Occlusion and stenosis of unspecified carotid artery: Secondary | ICD-10-CM

## 2011-01-30 DIAGNOSIS — I739 Peripheral vascular disease, unspecified: Secondary | ICD-10-CM

## 2011-01-30 DIAGNOSIS — R0989 Other specified symptoms and signs involving the circulatory and respiratory systems: Secondary | ICD-10-CM

## 2011-01-31 ENCOUNTER — Encounter: Payer: Self-pay | Admitting: Cardiology

## 2011-02-09 ENCOUNTER — Other Ambulatory Visit: Payer: Self-pay | Admitting: Pulmonary Disease

## 2011-02-26 ENCOUNTER — Other Ambulatory Visit (INDEPENDENT_AMBULATORY_CARE_PROVIDER_SITE_OTHER): Payer: Medicare Other | Admitting: *Deleted

## 2011-02-26 DIAGNOSIS — I10 Essential (primary) hypertension: Secondary | ICD-10-CM

## 2011-02-26 DIAGNOSIS — I359 Nonrheumatic aortic valve disorder, unspecified: Secondary | ICD-10-CM

## 2011-02-26 DIAGNOSIS — E7800 Pure hypercholesterolemia, unspecified: Secondary | ICD-10-CM

## 2011-02-26 DIAGNOSIS — Z79899 Other long term (current) drug therapy: Secondary | ICD-10-CM

## 2011-02-26 LAB — LIPID PANEL
HDL: 70.6 mg/dL (ref >39.00)
Triglycerides: 27 mg/dL (ref 0.0–149.0)
VLDL: 5.4 mg/dL (ref 0.0–40.0)

## 2011-02-26 LAB — HEPATIC FUNCTION PANEL
ALT: 20 U/L (ref 0–53)
AST: 27 U/L (ref 0–37)
Albumin: 4.2 g/dL (ref 3.5–5.2)
Total Bilirubin: 0.9 mg/dL (ref 0.3–1.2)

## 2011-03-01 ENCOUNTER — Ambulatory Visit (INDEPENDENT_AMBULATORY_CARE_PROVIDER_SITE_OTHER): Payer: Medicare Other | Admitting: Cardiology

## 2011-03-01 ENCOUNTER — Encounter: Payer: Self-pay | Admitting: Cardiology

## 2011-03-01 VITALS — BP 162/74 | HR 85 | Ht 65.0 in | Wt 175.1 lb

## 2011-03-01 DIAGNOSIS — I359 Nonrheumatic aortic valve disorder, unspecified: Secondary | ICD-10-CM

## 2011-03-01 DIAGNOSIS — E7800 Pure hypercholesterolemia, unspecified: Secondary | ICD-10-CM

## 2011-03-01 DIAGNOSIS — I739 Peripheral vascular disease, unspecified: Secondary | ICD-10-CM

## 2011-03-01 DIAGNOSIS — I350 Nonrheumatic aortic (valve) stenosis: Secondary | ICD-10-CM

## 2011-03-01 DIAGNOSIS — I10 Essential (primary) hypertension: Secondary | ICD-10-CM

## 2011-03-01 MED ORDER — ATORVASTATIN CALCIUM 80 MG PO TABS: 80.0000 mg | ORAL_TABLET | Freq: Every day | ORAL | Status: DC

## 2011-03-01 NOTE — Progress Notes (Signed)
PCP: Dr. Kriste Basque  77 yo with HTN, diet-controlled diabetes, hyperlipidemia, mild aortic stenosis, and PAD presents for cardiology followup.  Patient symptomatically does quite well.  He denies exertional dyspnea or chest pain.  He is active and push mows his yard.  ABIs are mildly decreased but he does not get symptoms consistent with claudication.  Patient's BP is 156/80 today.  At home, he says it runs in the systolic 120s-130s rang consistently.  He checks BP several times a week.  Taking Lipitor regularly, LDL is down to 91.      Labs (3/12): K 3.9, creatinine 1.0, TSH normal, LDL 150, HDL 76 Labs (6/12): LFTs normal, LDL 91, HDL 71  PMH: 1. HTN 2. Hyperlipidemia 3. Diabetes mellitus: diet-controlled 4. Allergic rhinitis 5. Myoview (4/05): no ischemia or infarction. 6. PAD: ABIs 2005 with 0.92 on right, 0.78 on left.  Peripheral arterial evaluation (5/12): ABI 0.82 right/0.79 left, TBI 0.69 right/0.54 left (not at risk of tissue loss).  7. GERD 8. Prostatitis 9. S/p inguinal hernia repair. 10. Aortic stenosis: Mild by echo.  Echo (3/12) with mild LVH, mild to moderate focal basal septal hypertrophy with no LV outflow tract gradient, mild aortic stenosis, normal RV size and systolic function.  11. Carotid dopplers (5/12): 0-39% bilateral ICA stenosis.   SH: Retired, married, never smoked.    FH: Father with CVA in his 43s, no premature CAD, no AAA  ROS: All systems reviewed and negative except as per HPI.   Current Outpatient Prescriptions  Medication Sig Dispense Refill  . amLODipine (NORVASC) 10 MG tablet TAKE 1 TABLET EVERY DAY  30 tablet  4  . aspirin 81 MG tablet Take 81 mg by mouth daily.        . Garlic Oil 1000 MG CAPS Take 1 capsule by mouth daily.        Marland Kitchen guaiFENesin (MUCINEX) 600 MG 12 hr tablet Take 1,200 mg by mouth as needed.        Marland Kitchen KLOR-CON M20 20 MEQ tablet TAKE 1 TABLET EVERY DAY  30 tablet  5  . MICARDIS HCT 80-25 MG per tablet TAKE 1 TABLET EVERY DAY  30 tablet   4  . mometasone (NASONEX) 50 MCG/ACT nasal spray 2 sprays by Nasal route daily.        . Multiple Vitamins-Minerals (CENTRUM SILVER) tablet Take 1 tablet by mouth daily.        . Omega-3 Fatty Acids (FISH OIL) 1000 MG CAPS Take 1 capsule by mouth daily.        . ranitidine (ZANTAC) 150 MG capsule Take 150 mg by mouth as needed.       . Tamsulosin HCl (FLOMAX) 0.4 MG CAPS Take 0.4 mg by mouth as needed.        Marland Kitchen DISCONTD: atorvastatin (LIPITOR) 40 MG tablet Take 40 mg by mouth daily.        Marland Kitchen atorvastatin (LIPITOR) 80 MG tablet Take 1 tablet (80 mg total) by mouth daily.  30 tablet  6  . DISCONTD: potassium chloride (KLOR-CON) 20 MEQ packet Take 20 mEq by mouth daily.          BP 162/74  Pulse 85  Ht 5\' 5"  (1.651 m)  Wt 175 lb 1.9 oz (79.434 kg)  BMI 29.14 kg/m2 General: NAD Neck: No JVD, no thyromegaly or thyroid nodule.  Lungs: Clear to auscultation bilaterally with normal respiratory effort. CV: Nondisplaced PMI.  Heart regular S1/S2, no S3/S4, 3/6 systolic crescendo-decrescendo murmur RUSB with  clear S2.  No peripheral edema.  Soft carotid bruit L>R versus referred murmur from AS.  1+ left PT pulse, difficult to palpate R PT pulse.  Abdomen: Soft, nontender, no hepatosplenomegaly, no distention.  Neurologic: Alert and oriented x 3.  Psych: Normal affect. Extremities: No clubbing or cyanosis.

## 2011-03-01 NOTE — Assessment & Plan Note (Signed)
Increase atorvastatin to 80 mg daily with lipids/LFTs in 2 months.  Goal LDL < 70.

## 2011-03-01 NOTE — Assessment & Plan Note (Signed)
Mildly decreased ABIs, moderately decreased TBIs but not at risk for tissue loss.  He does not have symptoms consistent with claudication.  Treat LDL to < 70.

## 2011-03-01 NOTE — Assessment & Plan Note (Signed)
Mild aortic stenosis by echo in 3/12.  It is possible that statin use may decrease progression (though not definitive).  Would aim for good BP control and statin use.  Repeat echo in a couple of years for progression.  

## 2011-03-01 NOTE — Patient Instructions (Addendum)
Take and record your blood pressure. Call in about 2 weeks with the readings.  Increase lipitor to 80mg  daily. You can take two 40mg  tablets at the same time.  Schedule an appointment for fasting lab in 2 months--lipid profile/liver profile 272.0  401.9  Your physician wants you to follow-up in: 6 months with Dr Shirlee Latch. (December 2012). You will receive a reminder letter in the mail two months in advance. If you don't receive a letter, please call our office to schedule the follow-up appointment.

## 2011-03-01 NOTE — Assessment & Plan Note (Signed)
BP elevated in office but normal when he checks at home (checks frequently).  ? White coat effect.  I will have him check his BP at a drug store (rather than his home cuff) for the next 2 weeks and call the results in to me to make sure that he is getting accurate readings from his home cuff.

## 2011-04-28 ENCOUNTER — Other Ambulatory Visit: Payer: Self-pay | Admitting: Pulmonary Disease

## 2011-04-30 ENCOUNTER — Other Ambulatory Visit (INDEPENDENT_AMBULATORY_CARE_PROVIDER_SITE_OTHER): Payer: Medicare Other | Admitting: *Deleted

## 2011-04-30 DIAGNOSIS — E7800 Pure hypercholesterolemia, unspecified: Secondary | ICD-10-CM

## 2011-04-30 DIAGNOSIS — I10 Essential (primary) hypertension: Secondary | ICD-10-CM

## 2011-04-30 LAB — HEPATIC FUNCTION PANEL
ALT: 24 U/L (ref 0–53)
AST: 27 U/L (ref 0–37)
Albumin: 4.2 g/dL (ref 3.5–5.2)
Alkaline Phosphatase: 49 U/L (ref 39–117)
Bilirubin, Direct: 0.1 mg/dL (ref 0.0–0.3)
Total Protein: 7.1 g/dL (ref 6.0–8.3)

## 2011-04-30 LAB — LIPID PANEL
Cholesterol: 142 mg/dL (ref 0–200)
LDL Cholesterol: 62 mg/dL (ref 0–99)
Triglycerides: 25 mg/dL (ref 0.0–149.0)

## 2011-05-03 ENCOUNTER — Telehealth: Payer: Self-pay | Admitting: *Deleted

## 2011-05-03 NOTE — Telephone Encounter (Signed)
Notes Recorded by Jacqlyn Krauss, RN on 05/03/2011 at 4:23 PM I gave pt results. Pt states since Lipitor increased to 80mg  daily 03/01/11 he has more leg cramps. He is able to tolerate them. I suggested pt try Coenzyme Q10 200mg  daily. I will forward to Dr Shirlee Latch for review/ other recommendations. Notes Recorded by Marca Ancona, MD on 04/30/2011 at 5:47 PM Excellent lipids

## 2011-05-03 NOTE — Telephone Encounter (Signed)
Agree with recommendation

## 2011-05-03 NOTE — Telephone Encounter (Signed)
Message copied by Jacqlyn Krauss on Thu May 03, 2011  4:23 PM ------      Message from: Laurey Morale      Created: Mon Apr 30, 2011  5:47 PM       Excellent lipids

## 2011-05-04 NOTE — Telephone Encounter (Signed)
I talked with pt. He will try coenzyme Q10 200mg  daily. If he continues to have problems with leg cramps/pain he will call.

## 2011-05-07 ENCOUNTER — Other Ambulatory Visit: Payer: Self-pay | Admitting: Pulmonary Disease

## 2011-05-29 ENCOUNTER — Telehealth: Payer: Self-pay | Admitting: Pulmonary Disease

## 2011-05-29 DIAGNOSIS — I10 Essential (primary) hypertension: Secondary | ICD-10-CM

## 2011-05-29 DIAGNOSIS — R7309 Other abnormal glucose: Secondary | ICD-10-CM

## 2011-05-29 NOTE — Telephone Encounter (Signed)
Please advise Dr. Nadel what labs pt will need. Thanks  Larrissa Stivers Silva, CMA  

## 2011-05-30 NOTE — Telephone Encounter (Signed)
Called, spoke with pt.  He is aware ok for labs prior to OV which are in the computer.  He verbalized understanding of this and voiced no further questions/concerns at this time.

## 2011-05-30 NOTE — Telephone Encounter (Signed)
Per SN---ok for labs prior to ov---labs are already in the computer.  Please let pt know.  thanks

## 2011-06-26 ENCOUNTER — Other Ambulatory Visit (INDEPENDENT_AMBULATORY_CARE_PROVIDER_SITE_OTHER): Payer: Medicare Other

## 2011-06-26 DIAGNOSIS — R7309 Other abnormal glucose: Secondary | ICD-10-CM

## 2011-06-26 DIAGNOSIS — I10 Essential (primary) hypertension: Secondary | ICD-10-CM

## 2011-06-26 LAB — BASIC METABOLIC PANEL
BUN: 14 mg/dL (ref 6–23)
CO2: 28 mEq/L (ref 19–32)
Calcium: 9 mg/dL (ref 8.4–10.5)
GFR: 80.95 mL/min (ref >60.00)
Glucose, Bld: 97 mg/dL (ref 70–99)

## 2011-07-02 ENCOUNTER — Ambulatory Visit (INDEPENDENT_AMBULATORY_CARE_PROVIDER_SITE_OTHER): Payer: Medicare Other | Admitting: Pulmonary Disease

## 2011-07-02 ENCOUNTER — Encounter: Payer: Self-pay | Admitting: Pulmonary Disease

## 2011-07-02 DIAGNOSIS — E7800 Pure hypercholesterolemia, unspecified: Secondary | ICD-10-CM

## 2011-07-02 DIAGNOSIS — R7309 Other abnormal glucose: Secondary | ICD-10-CM

## 2011-07-02 DIAGNOSIS — I10 Essential (primary) hypertension: Secondary | ICD-10-CM

## 2011-07-02 DIAGNOSIS — Z23 Encounter for immunization: Secondary | ICD-10-CM

## 2011-07-02 DIAGNOSIS — I350 Nonrheumatic aortic (valve) stenosis: Secondary | ICD-10-CM

## 2011-07-02 DIAGNOSIS — M1990 Unspecified osteoarthritis, unspecified site: Secondary | ICD-10-CM

## 2011-07-02 DIAGNOSIS — I359 Nonrheumatic aortic valve disorder, unspecified: Secondary | ICD-10-CM

## 2011-07-02 DIAGNOSIS — I739 Peripheral vascular disease, unspecified: Secondary | ICD-10-CM

## 2011-07-02 MED ORDER — RANITIDINE HCL 150 MG PO CAPS: 150.0000 mg | ORAL_CAPSULE | ORAL | Status: DC | PRN

## 2011-07-02 MED ORDER — ATORVASTATIN CALCIUM 80 MG PO TABS: ORAL_TABLET | ORAL | Status: DC

## 2011-07-02 MED ORDER — TELMISARTAN-HCTZ 80-25 MG PO TABS: 1.0000 | ORAL_TABLET | Freq: Every day | ORAL | Status: DC

## 2011-07-02 MED ORDER — AMLODIPINE BESYLATE 10 MG PO TABS: 10.0000 mg | ORAL_TABLET | Freq: Every day | ORAL | Status: DC

## 2011-07-02 MED ORDER — TAMSULOSIN HCL 0.4 MG PO CAPS: 0.4000 mg | ORAL_CAPSULE | ORAL | Status: DC | PRN

## 2011-07-02 MED ORDER — POTASSIUM CHLORIDE CRYS ER 20 MEQ PO TBCR: 20.0000 meq | EXTENDED_RELEASE_TABLET | Freq: Every day | ORAL | Status: DC

## 2011-07-02 NOTE — Progress Notes (Signed)
Subjective:    Patient ID: Ronnie Boyd, male    DOB: 08/04/1928, 76 y.o.   MRN: [id number]  HPI 77 y/o BM here for a follow up visit... he has mult med problems as noted below...  ~  May 26, 2010:  another good 22mo- no new complaints or concerns... BP controlled on meds;  TChol & LDL are not at goal on the Simva40, and since he is on Norvasc as well we will have to switch to Lipitor;  BS remains adeq on diet alone;  he exercises w/ yard work & weight is down 3# to 178# today... OK 2011 Flu vaccine.  ~  November 22, 2010:  22mo ROV c/o continued sinus problems despite recent eval by DrShoemaker/ ENT... family called & wanted Ronnie Boyd on Singulair (like the grandkids) so they can travel; we reviewed regimen w/ antihist, mucinex, saline, nasonex...    HBP>  controlled on Amlodipine, Micardis, KCl- BP= 140/70 & similar at home; denies CP, palpit, SOB, etc; exam today shows sys murmur & we discussed checking 2DEcho for further eval...    Chol>  now on Lip40 but not at goal w/ FLP showing TChol 229, TG 51, HDL 67, LDL 150; needs better diet, get wt down, take med every day!    DM>  on diet alone & BS 96, A1c 7/1; told he will need medication if he can't get wt down.  ~  July 02, 2011:  18mo ROV & he continues to do well, no new complaints or concerns, wants cheaper Micardis & he was offered change to Losartan but he ultimately declined; ok Flu shot today...    HBP>  controlled on Amlodipine10, MicardisHCT80-25, KCl;  BP= 124/72 & similar at home; denies CP, palpit, SOB, etc; he has sys murmur & 2DEcho 3/12 showed mildLVH, norm wall motion w/ EF=65-70%, Gr1DD, calcif AoV leaflets w/ mild AS...    AS> 2DEcho results as noted; he saw DrMcLean 6/12 w/ rec for good BP control & statin Rx...    Periph Vasc dis> on ASA; he has decr ABIs & mild plaque in carotids (see studies below)...    Chol>  DrMcLean tried to incr his Lip40 to Lip80 but he c/o aches & pains, tried CoQ10, then decr back to 40mg /d on his  own & feeling better; FLP was sl better on the 80mg  dose but he couldn't tolerate & prefers the 40mg  dose (see below)...    DM>  on diet alone & BS 97, A1c 6.7 which is improved; told he will need medication if he can't get wt down.   Current Problem List:  ALLERGIC RHINITIS (ICD-477.9) - he uses OTC Zyrtek & Mucinex, and knows to avoid pseudophed...  HYPERTENSION (ICD-401.9) - on NORVASC 10mg /d, MICARDIS/Hct 80-25 daily, & K20/d...  ~  NuclearStressTest 4/05 was norm- no infarct or iscemia, EF=72%... ~  2DEcho 3/12 showed mildLVH, norm wall motion w/ EF=65-70%, Gr1DD, calcif AoV leaflets w/ mild AS... ~  BP today = 124/72, feeling well, taking meds regularly, no reported side-effects... home checks are all good per pt- "Boyd better than here"... denies HA, fatigue, visual changes, CP, palipit, dizziness, syncope, dyspnea, edema, etc...  AORTIC STENOSIS >> he is followed for Cards by DrMcLean... ~  EKG 4/12 showed NSR, NSSTTWA, NAD... ~  2DEcho 3/12 showed mildLVH, norm wall motion w/ EF=65-70%, Gr1DD, calcif AoV leaflets w/ mild AS...  PERIPHERAL VASCULAR DISEASE (ICD-443.9) - on ASA 81mg /d... no leg pain or claudic symptoms. ~  ABI's 2005 showed .  92R & .78L... ~  ABI's 5/12 showed .82R & .79L (note- toe-brachial indicies are further reduced but not at risk of tissue loss). ~  CDopplers 5/12 showed mild heterog plaque, 0-39% bilat ICA stenoses...  HYPERCHOLESTEROLEMIA (ICD-272.0) - now on LIPITOR 40mg /d + FISH OIL & Garlic pills... ~  FLP 8/08 on Simva20 showed TChol 199, TG 33, HDL 81, LDL 111 ~  FLP 3/09 on Simva20 showed TChol 211, TG 37, HDL 91, LDL 110... great HDL!!! ~  FLP 9/09 on Simva20 showed TChol 190, TG 34, HDL 77, LDL 107 ~  FLP 3/10 on Simva20 showed TChol 238, Tg 35, HDL 76, LDL 138... rec> incr Simva40. ~  FLP 9/10 on Simva40 showed TChol 206, TG 27, HDL 81, LDL 108 ~  FLP 3/11 on Simva40 showed TChol 189, TG 32, HDL 83, LDL 98 ~  FLP 9/11 on Simva40 showed TChol 222,  TG 33, HDL 76, LDL 136... not at goal, rec> ch to Lipitor40. ~  FLP 6/12 on Lip40 showed TChol 167, TG 27, HDL 71, LDL 91... DrMcLean incr to Lip80. ~  FLP 8/12 on Lip80 showed TChol 142, TG 25, HDL 76, LDL 62... But pt INTOL & returned to 40mg /d.  DIABETES MELLITUS, BORDERLINE (ICD-790.29) - on diet alone & we discussed diet + exercise program. ~  labs 8/08 showed BS=107, HgA1c=6.4 on diet alone... rec to lose some weight. ~  labs 3/09 (wt=177#)showed BS= 94, HgA1c= 6.6.Marland KitchenMarland Kitchen rec- better diet efforts... ~  labs 9/09 (wt=176#) showed BS= 101, HgA1c= 6.6 ~  labs 3/10 (wt=181#) showed BS= 108, A1c= 6.7 ~  labs 9/10 (wt=181#) showed BS= 107, A1c= 6.6 ~  labs 3/11 (wt=181#) showed BS= 103 ~  labs 9/11 (wt=178##) showed BS= 96, A1c 6.9 ~  Labs 3/12 (wt=182#) showed BS= 96, A1c= 7.1 ~  Labs 10/12 (wt=177#) showed BS= 97, A1c= 6.7  GERD (ICD-530.81) - he takes Zantac 150mg /d as needed...  HEMORRHOIDS (ICD-455.6) - last colonoscopy was 4/04 by DrPatterson and norm x for hems...  PROSTATITIS, HX OF (ICD-V13.09) - followed by DrDahlstadt & seen 9/09- note reviewed... he has BPH,  renal cyst, ED... Rx w/ Viagra as needed. ~  labs 3/09 showed PSA= 0.69 ~  labs 3/10 showed PSA= 0.66 ~  labs 3/11 showed PSA= 0.69 ~  Labs 3/12 showed PSA= 0.76  DEGENERATIVE JOINT DISEASE (ICD-715.90)  HEALTH MAINTENANCE - he gets the yearly seasonal flu vaccines... [facility] (age 53[facility]... had Tetanus shot in ER ~2006...   Past Surgical History  Procedure Date  . Inguinal hernia repair 1989  . Carpal tunnel release     bilateral    Outpatient Encounter Prescriptions as of 07/02/2011  Medication Sig Dispense Refill  . amLODipine (NORVASC) 10 MG tablet TAKE 1 TABLET EVERY DAY  30 tablet  4  . aspirin 81 MG tablet Take 81 mg by mouth daily.        Marland Kitchen atorvastatin (LIPITOR) 80 MG tablet Take 40 mg by mouth daily.        . Coenzyme Q10 200 MG capsule Take one daily      . Garlic Oil  1000 MG CAPS Take 1 capsule by mouth daily.        Marland Kitchen guaiFENesin (MUCINEX) 600 MG 12 hr tablet Take 1,200 mg by mouth as needed.        Marland Kitchen KLOR-CON M20 20 MEQ tablet TAKE 1 TABLET EVERY DAY  30 tablet  5  .  MICARDIS HCT 80-25 MG per tablet TAKE 1 TABLET EVERY DAY  30 tablet  4  . mometasone (NASONEX) 50 MCG/ACT nasal spray 2 sprays by Nasal route daily.        . Multiple Vitamins-Minerals (CENTRUM SILVER) tablet Take 1 tablet by mouth daily.        . Omega-3 Fatty Acids (FISH OIL) 1000 MG CAPS Take 1 capsule by mouth daily.        . ranitidine (ZANTAC) 150 MG capsule Take 150 mg by mouth as needed.       . Tamsulosin HCl (FLOMAX) 0.4 MG CAPS Take 0.4 mg by mouth as needed.        Marland Kitchen DISCONTD: atorvastatin (LIPITOR) 80 MG tablet Take 1 tablet (80 mg total) by mouth daily.  30 tablet  6    Allergies  Allergen Reactions  . Tetracycline     REACTION: pt unsure of reaction    Current Medications, Allergies, Past Medical History, Past Surgical History, Family History, and Social History were reviewed in [employer] record.   Review of Systems        See HPI - all other systems neg except as noted...  The patient complains of dyspnea on exertion.  The patient denies anorexia, fever, weight loss, weight gain, vision loss, decreased hearing, hoarseness, chest pain, syncope, peripheral edema, prolonged cough, headaches, hemoptysis, abdominal pain, melena, hematochezia, severe indigestion/heartburn, hematuria, incontinence, muscle weakness, suspicious skin lesions, transient blindness, difficulty walking, depression, unusual weight change, abnormal bleeding, enlarged lymph nodes, and angioedema.     Objective:   Physical Exam     WD, Overweight, 77 y/o BM in NAD... GENERAL:  Alert & oriented; pleasant & cooperative... HEENT:  [place]/AT, EOM-wnl, PERRLA, EACs-clear, TMs-wnl, NOSE-clear, THROAT-clear & wnl. NECK:  Supple w/ fair ROM; no JVD; normal carotid impulses w/o bruits; no  thyromegaly or nodules palpated; no lymphadenopathy. CHEST:  Clear to P & A; without wheezes/ rales/ or rhonchi. HEART:  Regular Rhythm; without murmurs/ rubs/ or gallops. ABDOMEN:  Soft & nontender; normal bowel sounds; no organomegaly or masses detected. EXT: without deformities, mild arthritic changes; no varicose veins/ +venous insuffic/ no edema. NEURO:  CN's intact; motor testing normal; sensory testing normal; gait normal & balance OK. DERM:  No lesions noted; no rash etc...  RADIOLOGY DATA:  Reviewed in the EPIC EMR & discussed w/ the patient...  LABORATORY DATA:  Reviewed in the EPIC EMR & discussed w/ the patient...   Assessment & Plan:   HBP>  BP remains well controlled on Norvasc & MicardisHCT; continue same + diet efforts...  AS>  2DEcho 3/12 w/ mild AS; he was eval by DrMcLean who rec good BP & chol control...  ASPVD>  He has decr ABIs but no claudication; CDopplers are ok as well & he is w/o cerebral ischemic symptoms...  CHOL>  He was intol to Lip80 [facility] by [facility]; he is tolerating the Lip40 & prev labs were ok on this...  DM>  On diet alone w/ A1c=6.7 & we reviewed low carbs & need for wt reduction...  GI>  Stable & up to date...  GU>  Stable & PSA's are wnl...  DJD>  Aware, and he manages well w/ OTC meds as needed.Marland KitchenMarland Kitchen

## 2011-07-02 NOTE — Patient Instructions (Signed)
Today we updated your med list in EPIC...    Continue your current meds the same...    Consider getting a copy of your insurance companies "drug formulary" to see if we can save some $$  We reviewed your recent lab tests> keep up the good work...  We gave you the 2012 flu vaccine today...  Call for any questions...  Let's plan a follow up eval in 6 months time.Marland KitchenMarland Kitchen

## 2011-07-12 ENCOUNTER — Encounter: Payer: Self-pay | Admitting: Pulmonary Disease

## 2011-09-20 ENCOUNTER — Telehealth: Payer: Self-pay | Admitting: Pulmonary Disease

## 2011-09-20 MED ORDER — AMOXICILLIN-POT CLAVULANATE 875-125 MG PO TABS: 1.0000 | ORAL_TABLET | Freq: Two times a day (BID) | ORAL | Status: AC

## 2011-09-20 NOTE — Telephone Encounter (Signed)
Per SN okay to call in Augmentin 875 mg 1 po BID #14 0 refills, take mucinex 2 po BID w/ plenty of fluids, saline nasal rinse.--I spoke with pt and is aware of SN recs. Pt voiced his understanding and [facility] [address] per pt request.

## 2011-09-20 NOTE — Telephone Encounter (Signed)
I spoke with pt and he c/o  Runny nose, sinus pressure, cough w/ brown phlem, nasal congestion x 10 days. Pt denies any f/c/s/n/v. Pt is requesting something to be called in. Please advise Dr. Kriste Basque, thanks  Allergies  Allergen Reactions  . Tetracycline     REACTION: pt unsure of reaction

## 2011-10-10 ENCOUNTER — Other Ambulatory Visit: Payer: Self-pay | Admitting: Pulmonary Disease

## 2011-10-30 ENCOUNTER — Ambulatory Visit (INDEPENDENT_AMBULATORY_CARE_PROVIDER_SITE_OTHER): Payer: Medicare Other | Admitting: Cardiology

## 2011-10-30 ENCOUNTER — Encounter: Payer: Self-pay | Admitting: Cardiology

## 2011-10-30 DIAGNOSIS — I350 Nonrheumatic aortic (valve) stenosis: Secondary | ICD-10-CM

## 2011-10-30 DIAGNOSIS — I359 Nonrheumatic aortic valve disorder, unspecified: Secondary | ICD-10-CM

## 2011-10-30 DIAGNOSIS — I739 Peripheral vascular disease, unspecified: Secondary | ICD-10-CM

## 2011-10-30 DIAGNOSIS — I779 Disorder of arteries and arterioles, unspecified: Secondary | ICD-10-CM

## 2011-10-30 DIAGNOSIS — E7800 Pure hypercholesterolemia, unspecified: Secondary | ICD-10-CM

## 2011-10-30 DIAGNOSIS — I10 Essential (primary) hypertension: Secondary | ICD-10-CM

## 2011-10-30 NOTE — Patient Instructions (Signed)
Your physician recommends that you return for a FASTING lipid profile /liver profile/BMET in the next week or so.  Your physician wants you to follow-up in: 6 months with Dr Shirlee Latch. (August  2013).You will receive a reminder letter in the mail two months in advance. If you don't receive a letter, please call our office to schedule the follow-up appointment.

## 2011-10-31 NOTE — Progress Notes (Signed)
PCP: Dr. Kriste Basque  78 yo with HTN, diet-controlled diabetes, hyperlipidemia, mild aortic stenosis, and PAD presents for cardiology followup.  Patient symptomatically does quite well.  He denies exertional dyspnea or chest pain.  He is active without significant limitation.  ABIs are mildly decreased but he does not get symptoms consistent with claudication.  Patient's BP has been under control.  He has not been able to tolerate atorvastatin at doses greater than 40 mg every other day due to leg cramps.    Labs (3/12): K 3.9, creatinine 1.0, TSH normal, LDL 150, HDL 76 Labs (6/12): LFTs normal, LDL 91, HDL 71 Labs (8/12): LDL 62, HDL 75 LBS (10/12): K 3.6, creatinine 1.1  ECG: NSR, inferior Qs, left axis deviation  PMH: 1. HTN 2. Hyperlipidemia 3. Diabetes mellitus: diet-controlled 4. Allergic rhinitis 5. Myoview (4/05): no ischemia or infarction. 6. PAD: ABIs 2005 with 0.92 on right, 0.78 on left.  Peripheral arterial evaluation (5/12): ABI 0.82 right/0.79 left, TBI 0.69 right/0.54 left (not at risk of tissue loss).  7. GERD 8. Prostatitis 9. S/p inguinal hernia repair. 10. Aortic stenosis: Mild by echo.  Echo (3/12) with mild LVH, mild to moderate focal basal septal hypertrophy with no LV outflow tract gradient, mild aortic stenosis, normal RV size and systolic function.  11. Carotid dopplers (5/12): 0-39% bilateral ICA stenosis.   SH: Retired, married, never smoked.    FH: Father with CVA in his 4s, no premature CAD, no AAA   Current Outpatient Prescriptions  Medication Sig Dispense Refill  . amLODipine (NORVASC) 10 MG tablet Take 1 tablet (10 mg total) by mouth daily.  90 tablet  3  . aspirin 81 MG tablet Take 81 mg by mouth daily.        Marland Kitchen atorvastatin (LIPITOR) 80 MG tablet Take 40 mg by mouth every other day. Take as directed      . Coenzyme Q10 200 MG capsule Take one daily      . Garlic Oil 1000 MG CAPS Take 1 capsule by mouth daily.        Marland Kitchen guaiFENesin (MUCINEX) 600 MG 12  hr tablet Take 1,200 mg by mouth as needed.        . mometasone (NASONEX) 50 MCG/ACT nasal spray Place 2 sprays into the nose as needed.       . Multiple Vitamins-Minerals (CENTRUM SILVER) tablet Take 1 tablet by mouth daily.        . Omega-3 Fatty Acids (FISH OIL) 1000 MG CAPS Take 1 capsule by mouth daily.        . potassium chloride SA (KLOR-CON M20) 20 MEQ tablet Take 1 tablet (20 mEq total) by mouth daily.  90 tablet  3  . ranitidine (ZANTAC) 150 MG capsule Take 1 capsule (150 mg total) by mouth as needed.  90 capsule  3  . Tamsulosin HCl (FLOMAX) 0.4 MG CAPS Take 1 capsule (0.4 mg total) by mouth as needed.  90 capsule  3  . telmisartan-hydrochlorothiazide (MICARDIS HCT) 80-25 MG per tablet Take 1 tablet by mouth daily.  90 tablet  3  . DISCONTD: atorvastatin (LIPITOR) 80 MG tablet Take 1 tablet (80 mg total) by mouth daily.  30 tablet  6    BP 136/68  Pulse 75  Ht 5\' 4"  (1.626 m)  Wt 182 lb 12.8 oz (82.918 kg)  BMI 31.38 kg/m2 General: NAD Neck: No JVD, no thyromegaly or thyroid nodule.  Lungs: Clear to auscultation bilaterally with normal respiratory effort. CV: Nondisplaced  PMI.  Heart regular S1/S2, no S3/S4, 3/6 systolic crescendo-decrescendo murmur RUSB with clear S2.  No peripheral edema.  Soft carotid bruit L>R versus referred murmur from AS.  1+ left PT pulse, difficult to palpate R PT pulse.  Abdomen: Soft, nontender, no hepatosplenomegaly, no distention.  Neurologic: Alert and oriented x 3.  Psych: Normal affect. Extremities: No clubbing or cyanosis.

## 2011-10-31 NOTE — Assessment & Plan Note (Signed)
Goal LDL with PAD would be < 70.  He has not been able to tolerate atorvastatin at doses > 40 mg qod due to leg cramps.  Will get lipids to see where he is now.

## 2011-10-31 NOTE — Assessment & Plan Note (Signed)
BP is under good control.  

## 2011-10-31 NOTE — Assessment & Plan Note (Signed)
Mildly decreased ABIs, moderately decreased TBIs but not at risk for tissue loss.  He does not have symptoms consistent with claudication.  Continue aspirin and statin.  Needs to be more active, talked about doing more walking.

## 2011-10-31 NOTE — Assessment & Plan Note (Signed)
Mild aortic stenosis by echo in 3/12.  It is possible that statin use may decrease progression (though not definitive).  Would aim for good BP control and statin use.  Repeat echo next year for progression.

## 2011-11-06 ENCOUNTER — Other Ambulatory Visit (INDEPENDENT_AMBULATORY_CARE_PROVIDER_SITE_OTHER): Payer: Medicare Other

## 2011-11-06 DIAGNOSIS — I739 Peripheral vascular disease, unspecified: Secondary | ICD-10-CM

## 2011-11-06 DIAGNOSIS — E7800 Pure hypercholesterolemia, unspecified: Secondary | ICD-10-CM

## 2011-11-06 DIAGNOSIS — I779 Disorder of arteries and arterioles, unspecified: Secondary | ICD-10-CM

## 2011-11-06 LAB — LIPID PANEL
Cholesterol: 215 mg/dL — ABNORMAL HIGH (ref 0–200)
HDL: 75.1 mg/dL (ref >39.00)
Triglycerides: 42 mg/dL (ref 0.0–149.0)

## 2011-11-06 LAB — HEPATIC FUNCTION PANEL
ALT: 21 U/L (ref 0–53)
AST: 28 U/L (ref 0–37)
Albumin: 3.9 g/dL (ref 3.5–5.2)
Total Protein: 6.9 g/dL (ref 6.0–8.3)

## 2011-11-06 LAB — LDL CHOLESTEROL, DIRECT: Direct LDL: 131.5 mg/dL

## 2011-11-13 ENCOUNTER — Telehealth: Payer: Self-pay | Admitting: *Deleted

## 2011-11-13 DIAGNOSIS — E7800 Pure hypercholesterolemia, unspecified: Secondary | ICD-10-CM

## 2011-11-13 DIAGNOSIS — I359 Nonrheumatic aortic valve disorder, unspecified: Secondary | ICD-10-CM

## 2011-11-13 MED ORDER — ATORVASTATIN CALCIUM 80 MG PO TABS: ORAL_TABLET | ORAL | Status: DC

## 2011-11-13 NOTE — Telephone Encounter (Signed)
Notes Recorded by Jacqlyn Krauss, RN on 11/13/2011 at 9:03 AM Discussed with pt. He agreed to increase atorvastatin to 80mg  qod . He will return for fasting lipid/liver profile 01/09/12. ------  Notes Recorded by Marca Ancona, MD on 11/11/2011 at 11:52 PM LDL above goal (< 70 with PAD). Has to take atoravastatin every other day because of leg cramps with daily dosing. Could he increase from 40 qod to 80 qod? If he does this, check lipids/LFTs in 2 months.

## 2012-01-01 ENCOUNTER — Ambulatory Visit (INDEPENDENT_AMBULATORY_CARE_PROVIDER_SITE_OTHER): Payer: Medicare Other | Admitting: Pulmonary Disease

## 2012-01-01 ENCOUNTER — Encounter: Payer: Self-pay | Admitting: Pulmonary Disease

## 2012-01-01 ENCOUNTER — Other Ambulatory Visit (INDEPENDENT_AMBULATORY_CARE_PROVIDER_SITE_OTHER): Payer: Medicare Other

## 2012-01-01 VITALS — BP 122/62 | HR 81 | Temp 97.0°F | Ht 65.0 in | Wt 182.4 lb

## 2012-01-01 DIAGNOSIS — K219 Gastro-esophageal reflux disease without esophagitis: Secondary | ICD-10-CM

## 2012-01-01 DIAGNOSIS — E7800 Pure hypercholesterolemia, unspecified: Secondary | ICD-10-CM

## 2012-01-01 DIAGNOSIS — I10 Essential (primary) hypertension: Secondary | ICD-10-CM

## 2012-01-01 DIAGNOSIS — R7309 Other abnormal glucose: Secondary | ICD-10-CM

## 2012-01-01 DIAGNOSIS — Z87448 Personal history of other diseases of urinary system: Secondary | ICD-10-CM

## 2012-01-01 DIAGNOSIS — I359 Nonrheumatic aortic valve disorder, unspecified: Secondary | ICD-10-CM

## 2012-01-01 DIAGNOSIS — M1990 Unspecified osteoarthritis, unspecified site: Secondary | ICD-10-CM

## 2012-01-01 DIAGNOSIS — F411 Generalized anxiety disorder: Secondary | ICD-10-CM

## 2012-01-01 DIAGNOSIS — F419 Anxiety disorder, unspecified: Secondary | ICD-10-CM

## 2012-01-01 DIAGNOSIS — I350 Nonrheumatic aortic (valve) stenosis: Secondary | ICD-10-CM

## 2012-01-01 DIAGNOSIS — I739 Peripheral vascular disease, unspecified: Secondary | ICD-10-CM

## 2012-01-01 LAB — CBC WITH DIFFERENTIAL/PLATELET
Basophils Absolute: 0 10*3/uL (ref 0.0–0.1)
Eosinophils Absolute: 0.2 10*3/uL (ref 0.0–0.7)
Hemoglobin: 13.7 g/dL (ref 13.0–17.0)
Lymphocytes Relative: 26.4 % (ref 12.0–46.0)
MCHC: 31.8 g/dL (ref 30.0–36.0)
Monocytes Relative: 17.7 % — ABNORMAL HIGH (ref 3.0–12.0)
Neutro Abs: 2.6 10*3/uL (ref 1.4–7.7)
Platelets: 181 10*3/uL (ref 150.0–400.0)
RDW: 16.8 % — ABNORMAL HIGH (ref 11.5–14.6)

## 2012-01-01 LAB — BASIC METABOLIC PANEL
CO2: 27 mEq/L (ref 19–32)
GFR: 80.02 mL/min (ref >60.00)
Glucose, Bld: 111 mg/dL — ABNORMAL HIGH (ref 70–99)
Potassium: 3.9 mEq/L (ref 3.5–5.1)
Sodium: 137 mEq/L (ref 135–145)

## 2012-01-01 LAB — TSH: TSH: 1.62 u[IU]/mL (ref 0.35–5.50)

## 2012-01-01 LAB — HEPATIC FUNCTION PANEL
AST: 28 U/L (ref 0–37)
Albumin: 4.2 g/dL (ref 3.5–5.2)
Alkaline Phosphatase: 49 U/L (ref 39–117)
Total Protein: 7.5 g/dL (ref 6.0–8.3)

## 2012-01-01 NOTE — Patient Instructions (Addendum)
Today we updated your med list in our EPIC system...    We decided to STOP the Lipitor80 & switch to CRESTOR 10mg /d...  Today we did your follow up Blood work...    We will call you w/ the report...  Let's get on track w/ our diet & exercise program...    The goal is to lose 10-15 lbs...  Call for any questions...  We will plan a recheck lipid profile on the Crestor in 3 month, and routine f/u OV in 6 month's time.Marland KitchenMarland Kitchen

## 2012-01-01 NOTE — Progress Notes (Addendum)
Subjective:    Patient ID: RISHAB STOUDT, male    DOB: 08/04/1928, 78 y.o.   MRN: [id number]  HPI 78 y/o BM here for a follow up visit... he has mult med problems as noted below...  ~  November 22, 2010:  20mo ROV c/o continued sinus problems despite recent eval by DrShoemaker/ ENT... family called & wanted [name] on Singulair (like the grandkids) so they can travel; we reviewed regimen w/ antihist, mucinex, saline, nasonex...    HBP>  controlled on Amlodipine, Micardis, KCl- BP= 140/70 & similar at home; denies CP, palpit, SOB, etc; exam today shows sys murmur & we discussed checking 2DEcho for further eval...    Chol>  now on Lip40 but not at goal w/ FLP showing TChol 229, TG 51, HDL 67, LDL 150; needs better diet, get wt down, take med every day!    DM>  on diet alone & BS 96, A1c 7/1; told he will need medication if he can't get wt down.  ~  July 02, 2011:  32mo ROV & he continues to do well, no new complaints or concerns, wants cheaper Micardis & he was offered change to Losartan but he ultimately declined; ok Flu shot today...    HBP>  controlled on Amlodipine10, MicardisHCT80-25, KCl;  BP= 124/72 & similar at home; denies CP, palpit, SOB, etc; he has sys murmur & 2DEcho 3/12 showed mildLVH, norm wall motion w/ EF=65-70%, Gr1DD, calcif AoV leaflets w/ mild AS...    AS> 2DEcho results as noted; he saw DrMcLean 6/12 w/ rec for good BP control & statin Rx...    Periph Vasc dis> on ASA; he has decr ABIs & mild plaque in carotids (see studies below)...    Chol>  DrMcLean tried to incr his Lip40 to Lip80 but he c/o aches & pains, tried CoQ10, then decr back to 40mg /d on his own & feeling better; FLP was sl better on the 80mg  dose but he couldn't tolerate & prefers the 40mg  dose (see below)...    DM>  on diet alone & BS 97, A1c 6.7 which is improved; told he will need medication if he can't get wt down.  ~  January 01, 2012:  20mo ROV & [name] has had an uneventful interval- no new complaints or  concerns x he feels that his Lip80 is causing leg cramps & he wants to switch meds; we discussed Rx w/ low dose CREATOR 10mg /d;      He saw DrMcLean 2/13 for Cards f/u> HBP, PAD, mild AS; denies CP, very active & no limitations, not c/o claudication, had to decr Lip40 to Qod due to leg cramps but his FLP was not at goal & he rec incr to 80mg  Qod but INTOL per pt & he wants to switch meds...    He had f/u Urology DrDahlstedt 2/13>  Asymptomatic stable renal cysts, PSA screening (he insists on continuing), LUTS on [facility], ED on Levitra; they tried to switch him to [facility]...    We reviewed prob list, meds, xrays and labs> see below>> LABS 4/13:  Chems- wnl x BS=111 A1c=6.9;  CBC- wnl;  TSH=1.62   Problem List:     << PROBLEM LIST UPDATED 01/01/12 >>  ALLERGIC RHINITIS (ICD-477.9) - he uses OTC Zyrtek & Mucinex, and knows to avoid pseudophed...  HYPERTENSION (ICD-401.9) - on NORVASC 10mg /d, MICARDIS/Hct 80-25 daily, & K20/d...  ~  NuclearStressTest 4/05 was norm- no infarct or iscemia, EF=72%... ~  2DEcho 3/12 showed mildLVH, norm wall motion  w/ EF=65-70%, Gr1DD, calcif AoV leaflets w/ mild AS... ~  10/12:  BP= 124/72, feeling well, taking meds regularly, no reported side-effects... home checks are all good per pt- "even better than here"... denies HA, fatigue, visual changes, CP, palipit, dizziness, syncope, dyspnea, edema, etc... ~  4/13:  BP= 122/62 & he remains largely asymptomatic...  AORTIC STENOSIS >> he is followed for Cards by DrMcLean... ~  EKG 4/12 showed NSR, NSSTTWA, NAD... ~  2DEcho 3/12 showed mildLVH, norm wall motion w/ EF=65-70%, Gr1DD, calcif AoV leaflets w/ mild AS...  PERIPHERAL VASCULAR DISEASE (ICD-443.9) - on ASA 81mg /d... no leg pain or claudic symptoms. ~  ABI's 2005 showed .92R & .78L... ~  ABI's 5/12 showed .82R & .79L (note- toe-brachial indicies are further reduced but not at risk of tissue loss). ~  CDopplers 5/12 showed mild heterog plaque, 0-39% bilat ICA  stenoses...  HYPERCHOLESTEROLEMIA (ICD-272.0) - prev on LIPITOR 40mg /d + FISH OIL & Garlic pills... ~  FLP 8/08 on Simva20 showed TChol 199, TG 33, HDL 81, LDL 111 ~  FLP 3/09 on Simva20 showed TChol 211, TG 37, HDL 91, LDL 110... great HDL!!! ~  FLP 9/09 on Simva20 showed TChol 190, TG 34, HDL 77, LDL 107 ~  FLP 3/10 on Simva20 showed TChol 238, Tg 35, HDL 76, LDL 138... rec> incr Simva40. ~  FLP 9/10 on Simva40 showed TChol 206, TG 27, HDL 81, LDL 108 ~  FLP 3/11 on Simva40 showed TChol 189, TG 32, HDL 83, LDL 98 ~  FLP 9/11 on Simva40 showed TChol 222, TG 33, HDL 76, LDL 136... not at goal, rec> ch to Lipitor40. ~  FLP 6/12 on Lip40 showed TChol 167, TG 27, HDL 71, LDL 91... DrMcLean incr to Lip80. ~  FLP 8/12 on Lip80 showed TChol 142, TG 25, HDL 76, LDL 62... But pt INTOL & returned to 40mg /d. ~  Pt started taking the Lip Qod due to leg cramps & not at goal on Lip40Qod; DrMcLean changed to Lip80 Qod but intol. ~  4/13:  We discussed his dilemma & decided to change to CRESTOR 10mg /d regularly...  DIABETES MELLITUS, BORDERLINE (ICD-790.29) - on diet alone & we discussed diet + exercise program. ~  labs 8/08 showed BS=107, HgA1c=6.4 on diet alone... rec to lose some weight. ~  labs 3/09 (wt=177#)showed BS= 94, HgA1c= 6.6.Marland KitchenMarland Kitchen rec- better diet efforts... ~  labs 9/09 (wt=176#) showed BS= 101, HgA1c= 6.6 ~  labs 3/10 (wt=181#) showed BS= 108, A1c= 6.7 ~  labs 9/10 (wt=181#) showed BS= 107, A1c= 6.6 ~  labs 3/11 (wt=181#) showed BS= 103 ~  labs 9/11 (wt=178##) showed BS= 96, A1c 6.9 ~  Labs 3/12 (wt=182#) showed BS= 96, A1c= 7.1 ~  Labs 10/12 (wt=177#) showed BS= 97, A1c= 6.7 ~  Labs 4/13 (wt=182#) showed BS= 111, A1c= 6.9  GERD (ICD-530.81) - he takes Zantac 150mg /d as needed...  HEMORRHOIDS (ICD-455.6) - last colonoscopy was 4/04 by DrPatterson and norm x for hems...  PROSTATITIS, HX OF (ICD-V13.09) - followed by DrDahlstadt & seen 9/09- note reviewed... he has BPH,  renal cyst, ED...  Rx w/ Viagra as needed. ~  labs 3/09 showed PSA= 0.69 ~  labs 3/10 showed PSA= 0.66 ~  labs 3/11 showed PSA= 0.69 ~  Labs 3/12 showed PSA= 0.76 ~  PSA has been followed by Urology, DrDahlstedt.  DEGENERATIVE JOINT DISEASE (ICD-715.90)  HEALTH MAINTENANCE - he gets the yearly seasonal flu vaccines... [facility]age 89[facility]... had Tetanus shot in ER ~2006...   Past Surgical History  Procedure Date  . Inguinal hernia repair 1989  . Carpal tunnel release     bilateral    Outpatient Encounter Prescriptions as of 01/01/2012  Medication Sig Dispense Refill  . amLODipine (NORVASC) 10 MG tablet Take 1 tablet (10 mg total) by mouth daily.  90 tablet  3  . aspirin 81 MG tablet Take 81 mg by mouth daily.        Marland Kitchen atorvastatin (LIPITOR) 80 MG tablet Take 1 every other day  45 tablet  0  . Coenzyme Q10 200 MG capsule Take one daily      . Garlic Oil 1000 MG CAPS Take 1 capsule by mouth daily.        Marland Kitchen guaiFENesin (MUCINEX) 600 MG 12 hr tablet Take 1,200 mg by mouth as needed.        . mometasone (NASONEX) 50 MCG/ACT nasal spray Place 2 sprays into the nose as needed.       . Multiple Vitamins-Minerals (CENTRUM SILVER) tablet Take 1 tablet by mouth daily.        . Omega-3 Fatty Acids (FISH OIL) 1000 MG CAPS Take 1 capsule by mouth daily.        . potassium chloride SA (KLOR-CON M20) 20 MEQ tablet Take 1 tablet (20 mEq total) by mouth daily.  90 tablet  3  . ranitidine (ZANTAC) 150 MG capsule Take 1 capsule (150 mg total) by mouth as needed.  90 capsule  3  . Tamsulosin HCl (FLOMAX) 0.4 MG CAPS Take 1 capsule (0.4 mg total) by mouth as needed.  90 capsule  3  . telmisartan-hydrochlorothiazide (MICARDIS HCT) 80-25 MG per tablet Take 1 tablet by mouth daily.  90 tablet  3    No Known Allergies   Current Medications, Allergies, Past Medical History, Past Surgical History, Family History, and Social History were reviewed in [employer]  record.   Review of Systems        See HPI - all other systems neg except as noted...  The patient complains of dyspnea on exertion.  The patient denies anorexia, fever, weight loss, weight gain, vision loss, decreased hearing, hoarseness, chest pain, syncope, peripheral edema, prolonged cough, headaches, hemoptysis, abdominal pain, melena, hematochezia, severe indigestion/heartburn, hematuria, incontinence, muscle weakness, suspicious skin lesions, transient blindness, difficulty walking, depression, unusual weight change, abnormal bleeding, enlarged lymph nodes, and angioedema.     Objective:   Physical Exam     WD, Overweight, 78 y/o BM in NAD... GENERAL:  Alert & oriented; pleasant & cooperative... HEENT:  [place]/AT, EOM-wnl, PERRLA, EACs-clear, TMs-wnl, NOSE-clear, THROAT-clear & wnl. NECK:  Supple w/ fair ROM; no JVD; normal carotid impulses w/o bruits; no thyromegaly or nodules palpated; no lymphadenopathy. CHEST:  Clear to P & A; without wheezes/ rales/ or rhonchi. HEART:  Regular Rhythm; without murmurs/ rubs/ or gallops. ABDOMEN:  Soft & nontender; normal bowel sounds; no organomegaly or masses detected. EXT: without deformities, mild arthritic changes; no varicose veins/ +venous insuffic/ no edema. NEURO:  CN's intact; motor testing normal; sensory testing normal; gait normal & balance OK. DERM:  No lesions noted; no rash etc...  RADIOLOGY DATA:  Reviewed in the EPIC EMR & discussed w/ the patient...  LABORATORY DATA:  Reviewed in the EPIC EMR & discussed w/ the patient...   Assessment & Plan:   HBP>  BP remains well controlled on Norvasc & MicardisHCT; continue same + diet efforts.Marland KitchenMarland Kitchen  AS>  2DEcho 3/12 w/ mild AS; he was eval by DrMcLean who rec good BP & chol control...  ASPVD>  He has decr ABIs but no claudication; CDopplers are ok as well & he is w/o cerebral ischemic symptoms...  CHOL>  He was intol to Lip80 given by [facility]; he is tolerating the Lip40 & prev labs were  ok on this...  DM>  On diet alone w/ A1c=6.7 & we reviewed low carbs & need for wt reduction...  GI>  Stable & up to date...  GU>  Stable & PSA's are wnl...  DJD>  Aware, and he manages well w/ OTC meds as needed.Marland KitchenMarland Kitchen

## 2012-01-09 ENCOUNTER — Other Ambulatory Visit (INDEPENDENT_AMBULATORY_CARE_PROVIDER_SITE_OTHER): Payer: Medicare Other

## 2012-01-09 DIAGNOSIS — I739 Peripheral vascular disease, unspecified: Secondary | ICD-10-CM

## 2012-01-09 DIAGNOSIS — I359 Nonrheumatic aortic valve disorder, unspecified: Secondary | ICD-10-CM

## 2012-01-09 DIAGNOSIS — I779 Disorder of arteries and arterioles, unspecified: Secondary | ICD-10-CM

## 2012-01-09 DIAGNOSIS — E7800 Pure hypercholesterolemia, unspecified: Secondary | ICD-10-CM

## 2012-01-09 LAB — HEPATIC FUNCTION PANEL
ALT: 22 U/L (ref 0–53)
AST: 24 U/L (ref 0–37)
Bilirubin, Direct: 0.1 mg/dL (ref 0.0–0.3)
Total Bilirubin: 0.7 mg/dL (ref 0.3–1.2)
Total Protein: 7 g/dL (ref 6.0–8.3)

## 2012-01-09 LAB — BASIC METABOLIC PANEL
BUN: 20 mg/dL (ref 6–23)
Calcium: 9.1 mg/dL (ref 8.4–10.5)
GFR: 83.41 mL/min (ref >60.00)
Glucose, Bld: 103 mg/dL — ABNORMAL HIGH (ref 70–99)
Sodium: 138 mEq/L (ref 135–145)

## 2012-01-09 LAB — LIPID PANEL: Cholesterol: 153 mg/dL (ref 0–200)

## 2012-02-05 ENCOUNTER — Other Ambulatory Visit: Payer: Self-pay | Admitting: Pulmonary Disease

## 2012-03-12 ENCOUNTER — Telehealth: Payer: Self-pay | Admitting: Pulmonary Disease

## 2012-03-12 MED ORDER — ROSUVASTATIN CALCIUM 10 MG PO TABS: 10.0000 mg | ORAL_TABLET | Freq: Every day | ORAL | Status: DC

## 2012-03-12 NOTE — Telephone Encounter (Signed)
Rx refill for crestor was sent. Spoke with pt and notified that this was done. Nothing further needed.

## 2012-03-26 ENCOUNTER — Other Ambulatory Visit: Payer: Self-pay | Admitting: Pulmonary Disease

## 2012-06-21 ENCOUNTER — Other Ambulatory Visit: Payer: Self-pay | Admitting: Pulmonary Disease

## 2012-07-02 ENCOUNTER — Encounter: Payer: Self-pay | Admitting: *Deleted

## 2012-07-03 ENCOUNTER — Ambulatory Visit: Payer: Medicare Other | Admitting: Pulmonary Disease

## 2012-07-07 ENCOUNTER — Ambulatory Visit (INDEPENDENT_AMBULATORY_CARE_PROVIDER_SITE_OTHER): Payer: Medicare Other | Admitting: Pulmonary Disease

## 2012-07-07 ENCOUNTER — Other Ambulatory Visit (INDEPENDENT_AMBULATORY_CARE_PROVIDER_SITE_OTHER): Payer: Medicare Other

## 2012-07-07 ENCOUNTER — Encounter: Payer: Self-pay | Admitting: Pulmonary Disease

## 2012-07-07 VITALS — BP 130/72 | HR 60 | Temp 97.1°F | Ht 65.0 in | Wt 181.6 lb

## 2012-07-07 DIAGNOSIS — I10 Essential (primary) hypertension: Secondary | ICD-10-CM

## 2012-07-07 DIAGNOSIS — I359 Nonrheumatic aortic valve disorder, unspecified: Secondary | ICD-10-CM

## 2012-07-07 DIAGNOSIS — R7309 Other abnormal glucose: Secondary | ICD-10-CM

## 2012-07-07 DIAGNOSIS — I350 Nonrheumatic aortic (valve) stenosis: Secondary | ICD-10-CM

## 2012-07-07 DIAGNOSIS — Z87448 Personal history of other diseases of urinary system: Secondary | ICD-10-CM

## 2012-07-07 DIAGNOSIS — E7800 Pure hypercholesterolemia, unspecified: Secondary | ICD-10-CM

## 2012-07-07 DIAGNOSIS — M1990 Unspecified osteoarthritis, unspecified site: Secondary | ICD-10-CM

## 2012-07-07 DIAGNOSIS — I739 Peripheral vascular disease, unspecified: Secondary | ICD-10-CM

## 2012-07-07 DIAGNOSIS — K219 Gastro-esophageal reflux disease without esophagitis: Secondary | ICD-10-CM

## 2012-07-07 DIAGNOSIS — Z23 Encounter for immunization: Secondary | ICD-10-CM

## 2012-07-07 LAB — BASIC METABOLIC PANEL
CO2: 28 mEq/L (ref 19–32)
Calcium: 9.2 mg/dL (ref 8.4–10.5)
Chloride: 103 mEq/L (ref 96–112)
Creatinine, Ser: 1.1 mg/dL (ref 0.4–1.5)
Glucose, Bld: 94 mg/dL (ref 70–99)
Sodium: 138 mEq/L (ref 135–145)

## 2012-07-07 MED ORDER — TAMSULOSIN HCL 0.4 MG PO CAPS: 0.4000 mg | ORAL_CAPSULE | ORAL | Status: DC | PRN

## 2012-07-07 NOTE — Patient Instructions (Addendum)
Today we updated your med list in our EPIC system...    Continue your current medications the same...    We refilled your meds per request...  Try MARLEY's drugs to see if he can save you some $$$  Today we did your follow up DM labs>    We will communicate the results to you when avail...  Call for any questions...  Let's plan a follow uyp visit in 6 months time.Marland KitchenMarland Kitchen

## 2012-07-07 NOTE — Progress Notes (Signed)
Subjective:    Patient ID: Ronnie Boyd, male    DOB: 08/04/1928, 78 y.o.   MRN: [id number]  HPI 78 y/o BM here for a follow up visit... he has mult med problems as noted below...  ~  November 22, 2010:  42mo ROV c/o continued sinus problems despite recent eval by Ronnie Boyd/ ENT... family called & wanted Ronnie Boyd on Singulair (like the grandkids) so they can travel; we reviewed regimen w/ antihist, mucinex, saline, nasonex...    HBP>  controlled on Amlodipine, Micardis, KCl- BP= 140/70 & similar at home; denies CP, palpit, SOB, etc; exam today shows sys murmur & we discussed checking 2DEcho for further eval...    Chol>  now on Lip40 but not at goal w/ FLP showing TChol 229, TG 51, HDL 67, LDL 150; needs better diet, get wt down, take med every day!    DM>  on diet alone & BS 96, A1c 7/1; told he will need medication if he can't get wt down.  ~  July 02, 2011:  71mo ROV & he continues to do well, no new complaints or concerns, wants cheaper Micardis & he was offered change to Losartan but he ultimately declined; ok Flu shot today...    HBP>  controlled on Amlodipine10, MicardisHCT80-25, KCl;  BP= 124/72 & similar at home; denies CP, palpit, SOB, etc; he has sys murmur & 2DEcho 3/12 showed mildLVH, norm wall motion w/ EF=65-70%, Gr1DD, calcif AoV leaflets w/ mild AS...    AS> 2DEcho results as noted; he saw Ronnie Boyd 6/12 w/ rec for good BP control & statin Rx...    Periph Vasc dis> on ASA; he has decr ABIs & mild plaque in carotids (see studies below)...    Chol>  Ronnie Boyd tried to incr his Lip40 to Lip80 but he c/o aches & pains, tried CoQ10, then decr back to 40mg /d on his own & feeling better; FLP was sl better on the 80mg  dose but he couldn't tolerate & prefers the 40mg  dose (see below)...    DM>  on diet alone & BS 97, A1c 6.7 which is improved; told he will need medication if he can't get wt down.  ~  January 01, 2012:  42mo ROV & Ronnie Boyd has had an uneventful interval- no new complaints or  concerns x he feels that his Lip80 is causing leg cramps & he wants to switch meds; we discussed Rx w/ low dose CREATOR 10mg /d;      He saw Ronnie Boyd 2/13 for Cards f/u> HBP, PAD, mild AS; denies CP, very active & no limitations, not c/o claudication, had to decr Lip40 to Qod due to leg cramps but his FLP was not at goal & he rec incr to 80mg  Qod but INTOL per pt & he wants to switch meds...    He had f/u Urology Ronnie Boyd 2/13>  Asymptomatic stable renal cysts, PSA screening (he insists on continuing), LUTS on [facility], ED on Levitra; they tried to switch him to [facility]...    We reviewed prob list, meds, xrays and labs> see below>> LABS 4/13:  Chems- wnl x BS=111 A1c=6.9;  CBC- wnl;  TSH=1.62  ~  July 07, 2012:  42mo ROV & Ronnie Boyd is stable- no new complaints or concerns;  BP controlled on regimen & measures 130/72 today- denies CP, palpit, SOB, edema, we discussed poss switch to Losartan to save $$ & he will let me know;  He is followed by [facility] for Cards- known AS, seen 2/13 doing well, no  dizziness or syncope etc;  Chol is well regulated w/ Cres10 & he really needs to lose some weight- we discussed this again today;  DM however is adeq controlled on his low carb diet w/ BS94 & A1c6.8.Marland KitchenMarland Kitchen    We reviewed prob list, meds, xrays and labs> see below for updates >> OK 2013 Flu vaccine today... LABS 10/13:  Chems- wnl w/ BS=94, A1c=6.8 on diet alone...         Problem List:      ALLERGIC RHINITIS (ICD-477.9) - he uses OTC Zyrtek & Mucinex, and knows to avoid pseudophed...  HYPERTENSION (ICD-401.9) - on NORVASC 10mg /d, MICARDIS/Hct 80-25 daily, & K20/d...  ~  NuclearStressTest 4/05 was norm- no infarct or iscemia, EF=72%... ~  CXR 3/10 showed normal heart size, clear lungs, NAD... ~  2DEcho 3/12 showed mildLVH, norm wall motion w/ EF=65-70%, Gr1DD, calcif AoV leaflets w/ mild AS... ~  10/12:  BP= 124/72, feeling well, taking meds regularly, no reported side-effects... home checks are all good  per pt- "even better than here"... denies HA, fatigue, visual changes, CP, palipit, dizziness, syncope, dyspnea, edema, etc... ~  4/13:  BP= 122/62 & he remains largely asymptomatic... ~  10/13:  BP= 130/72 & he remains largely asymptomatic...  AORTIC STENOSIS >> he is followed for Cards by Ronnie Boyd... ~  EKG 4/12 showed NSR, NSSTTWA, NAD... ~  2DEcho 3/12 showed mildLVH, norm wall motion w/ EF=65-70%, Gr1DD, calcif AoV leaflets w/ mild AS... ~  EKG 2/13 showed NSR, rate75, infer infarct age undetermined...  PERIPHERAL VASCULAR DISEASE (ICD-443.9) - on ASA 81mg /d... no leg pain or claudic symptoms. ~  ABI's 2005 showed .92R & .78L... ~  ABI's 5/12 showed .82R & .79L (note- toe-brachial indicies are further reduced but not at risk of tissue loss). ~  CDopplers 5/12 showed mild heterog plaque, 0-39% bilat ICA stenoses...  HYPERCHOLESTEROLEMIA (ICD-272.0) - prev on LIPITOR 40mg /d + FISH OIL & Garlic pills... ~  FLP 8/08 on Simva20 showed TChol 199, TG 33, HDL 81, LDL 111 ~  FLP 3/09 on Simva20 showed TChol 211, TG 37, HDL 91, LDL 110... great HDL!!! ~  FLP 9/09 on Simva20 showed TChol 190, TG 34, HDL 77, LDL 107 ~  FLP 3/10 on Simva20 showed TChol 238, Tg 35, HDL 76, LDL 138... rec> incr Simva40. ~  FLP 9/10 on Simva40 showed TChol 206, TG 27, HDL 81, LDL 108 ~  FLP 3/11 on Simva40 showed TChol 189, TG 32, HDL 83, LDL 98 ~  FLP 9/11 on Simva40 showed TChol 222, TG 33, HDL 76, LDL 136... not at goal, rec> ch to Lipitor40. ~  FLP 6/12 on Lip40 showed TChol 167, TG 27, HDL 71, LDL 91... Ronnie Boyd incr to Lip80. ~  FLP 8/12 on Lip80 showed TChol 142, TG 25, HDL 76, LDL 62... But pt INTOL & returned to 40mg /d. ~  Pt started taking the Lip Qod due to leg cramps & not at goal on Lip40Qod; Ronnie Boyd changed to Lip80 Qod but intol. ~  4/13:  We discussed his dilemma & decided to change to CRESTOR 10mg /d regularly=> FLP showed TChol 153, TG 22, HDL 75, LDL 73  DIABETES MELLITUS, BORDERLINE (ICD-790.29) -  on diet alone & we discussed diet + exercise program. ~  labs 8/08 showed BS=107, HgA1c=6.4 on diet alone... rec to lose some weight. ~  labs 3/09 (wt=177#)showed BS= 94, HgA1c= 6.6.Marland KitchenMarland Kitchen rec- better diet efforts... ~  labs 9/09 (wt=176#) showed BS= 101, HgA1c= 6.6 ~  labs 3/10 (  wt=181#) showed BS= 108, A1c= 6.7 ~  labs 9/10 (wt=181#) showed BS= 107, A1c= 6.6 ~  labs 3/11 (wt=181#) showed BS= 103 ~  labs 9/11 (wt=178##) showed BS= 96, A1c 6.9 ~  Labs 3/12 (wt=182#) showed BS= 96, A1c= 7.1 ~  Labs 10/12 (wt=177#) showed BS= 97, A1c= 6.7 ~  Labs 4/13 (wt=182#) showed BS= 111, A1c= 6.9 ~  Labs 10/13 (wt=182#) showed BS= 94, A1c= 6.8  GERD (ICD-530.81) - he takes Zantac 150mg /d as needed...  HEMORRHOIDS (ICD-455.6) - last colonoscopy was 4/04 by DrPatterson and norm x for hems...  PROSTATITIS, HX OF (ICD-V13.09) - followed by DrDahlstadt & seen 9/09- note reviewed... he has BPH,  renal cyst, ED... Rx w/ Viagra as needed. ~  labs 3/09 showed PSA= 0.69 ~  labs 3/10 showed PSA= 0.66 ~  labs 3/11 showed PSA= 0.69 ~  Labs 3/12 showed PSA= 0.76 ~  PSA has been followed by Urology, Ronnie Boyd.  DEGENERATIVE JOINT DISEASE (ICD-715.90)  HEALTH MAINTENANCE - he gets the yearly seasonal flu vaccines... [facility] (age 70[facility]... had Tetanus shot in ER ~2006...   Past Surgical History  Procedure Date  . Inguinal hernia repair 1989  . Carpal tunnel release     bilateral    Outpatient Encounter Prescriptions as of 07/07/2012  Medication Sig Dispense Refill  . amLODipine (NORVASC) 10 MG tablet Take 1 tablet (10 mg total) by mouth daily.  90 tablet  3  . aspirin 81 MG tablet Take 81 mg by mouth daily.        . Coenzyme Q10 200 MG capsule Take one daily      . Garlic Oil 1000 MG CAPS Take 1 capsule by mouth daily.        Marland Kitchen guaiFENesin (MUCINEX) 600 MG 12 hr tablet Take 1,200 mg by mouth as needed.        Marland Kitchen MICARDIS HCT 80-25 MG per tablet TAKE 1 TABLET EVERY DAY  30  tablet  4  . mometasone (NASONEX) 50 MCG/ACT nasal spray Place 2 sprays into the nose as needed.       . Multiple Vitamins-Minerals (CENTRUM SILVER) tablet Take 1 tablet by mouth daily.        . Omega-3 Fatty Acids (FISH OIL) 1000 MG CAPS Take 1 capsule by mouth daily.        . potassium chloride SA (KLOR-CON M20) 20 MEQ tablet Take 1 tablet (20 mEq total) by mouth daily.  90 tablet  3  . ranitidine (ZANTAC) 150 MG capsule Take 1 capsule (150 mg total) by mouth as needed.  90 capsule  3  . rosuvastatin (CRESTOR) 10 MG tablet Take 1 tablet (10 mg total) by mouth daily.  30 tablet  11  . Tamsulosin HCl (FLOMAX) 0.4 MG CAPS Take 1 capsule (0.4 mg total) by mouth as needed.  90 capsule  3  . DISCONTD: amLODipine (NORVASC) 10 MG tablet TAKE 1 TABLET EVERY DAY  30 tablet  4  . DISCONTD: telmisartan-hydrochlorothiazide (MICARDIS HCT) 80-25 MG per tablet Take 1 tablet by mouth daily.  90 tablet  3    No Known Allergies   Current Medications, Allergies, Past Medical History, Past Surgical History, Family History, and Social History were reviewed in [employer] record.   Review of Systems        See HPI - all other systems neg except as noted...  The patient complains of dyspnea on exertion.  The patient  denies anorexia, fever, weight loss, weight gain, vision loss, decreased hearing, hoarseness, chest pain, syncope, peripheral edema, prolonged cough, headaches, hemoptysis, abdominal pain, melena, hematochezia, severe indigestion/heartburn, hematuria, incontinence, muscle weakness, suspicious skin lesions, transient blindness, difficulty walking, depression, unusual weight change, abnormal bleeding, enlarged lymph nodes, and angioedema.     Objective:   Physical Exam     WD, Overweight, 78 y/o BM in NAD... GENERAL:  Alert & oriented; pleasant & cooperative... HEENT:  [place]/AT, EOM-wnl, PERRLA, EACs-clear, TMs-wnl, NOSE-clear, THROAT-clear & wnl. NECK:  Supple w/ fair ROM; no  JVD; normal carotid impulses w/o bruits; no thyromegaly or nodules palpated; no lymphadenopathy. CHEST:  Clear to P & A; without wheezes/ rales/ or rhonchi. HEART:  Regular Rhythm; without murmurs/ rubs/ or gallops. ABDOMEN:  Soft & nontender; normal bowel sounds; no organomegaly or masses detected. EXT: without deformities, mild arthritic changes; no varicose veins/ +venous insuffic/ no edema. NEURO:  CN's intact; motor testing normal; sensory testing normal; gait normal & balance OK. DERM:  No lesions noted; no rash etc...  RADIOLOGY DATA:  Reviewed in the EPIC EMR & discussed w/ the patient...  LABORATORY DATA:  Reviewed in the EPIC EMR & discussed w/ the patient...   Assessment & Plan:    HBP>  BP remains well controlled on Norvasc & MicardisHCT; continue same + diet efforts...  AS>  2DEcho 3/12 w/ mild AS; he was eval by Ronnie Boyd who rec good BP & chol control...  ASPVD>  He has decr ABIs but no claudication; CDopplers are ok as well & he is w/o cerebral ischemic symptoms...  CHOL>  He was intol to Lip80 given by [facility]; he was switched to Cres10 w/ good control of lipids...  DM>  On diet alone w/ A1c=6.8 & we reviewed low carbs & need for wt reduction...  GI>  Stable & up to date...  GU>  Stable & PSA's are wnl...  DJD>  Aware, and he manages well w/ OTC meds as needed...   Patient's Medications  New Prescriptions   No medications on file  Previous Medications   AMLODIPINE (NORVASC) 10 MG TABLET    Take 1 tablet (10 mg total) by mouth daily.   ASPIRIN 81 MG TABLET    Take 81 mg by mouth daily.     COENZYME Q10 200 MG CAPSULE    Take one daily   GARLIC OIL 1000 MG CAPS    Take 1 capsule by mouth daily.     GUAIFENESIN (MUCINEX) 600 MG 12 HR TABLET    Take 1,200 mg by mouth as needed.     MICARDIS HCT 80-25 MG PER TABLET    TAKE 1 TABLET EVERY DAY   MOMETASONE (NASONEX) 50 MCG/ACT NASAL SPRAY    Place 2 sprays into the nose as needed.    MULTIPLE VITAMINS-MINERALS  (CENTRUM SILVER) TABLET    Take 1 tablet by mouth daily.     OMEGA-3 FATTY ACIDS (FISH OIL) 1000 MG CAPS    Take 1 capsule by mouth daily.     POTASSIUM CHLORIDE SA (KLOR-CON M20) 20 MEQ TABLET    Take 1 tablet (20 mEq total) by mouth daily.   RANITIDINE (ZANTAC) 150 MG CAPSULE    Take 1 capsule (150 mg total) by mouth as needed.   ROSUVASTATIN (CRESTOR) 10 MG TABLET    Take 1 tablet (10 mg total) by mouth daily.  Modified Medications   Modified Medication Previous Medication   TAMSULOSIN HCL (FLOMAX) 0.4 MG CAPS Tamsulosin HCl (  FLOMAX) 0.4 MG CAPS      Take 1 capsule (0.4 mg total) by mouth as needed.    Take 1 capsule (0.4 mg total) by mouth as needed.  Discontinued Medications   AMLODIPINE (NORVASC) 10 MG TABLET    TAKE 1 TABLET EVERY DAY   TELMISARTAN-HYDROCHLOROTHIAZIDE (MICARDIS HCT) 80-25 MG PER TABLET    Take 1 tablet by mouth daily.

## 2012-07-22 ENCOUNTER — Other Ambulatory Visit: Payer: Self-pay | Admitting: *Deleted

## 2012-07-22 ENCOUNTER — Other Ambulatory Visit: Payer: Self-pay | Admitting: Internal Medicine

## 2012-07-28 ENCOUNTER — Other Ambulatory Visit: Payer: Self-pay | Admitting: Pulmonary Disease

## 2012-08-24 ENCOUNTER — Other Ambulatory Visit: Payer: Self-pay | Admitting: Pulmonary Disease

## 2012-08-29 ENCOUNTER — Encounter: Payer: Self-pay | Admitting: Cardiology

## 2012-08-29 ENCOUNTER — Ambulatory Visit (INDEPENDENT_AMBULATORY_CARE_PROVIDER_SITE_OTHER): Payer: Medicare Other | Admitting: Cardiology

## 2012-08-29 VITALS — BP 140/78 | HR 100 | Ht 65.0 in | Wt 183.4 lb

## 2012-08-29 DIAGNOSIS — I359 Nonrheumatic aortic valve disorder, unspecified: Secondary | ICD-10-CM

## 2012-08-29 DIAGNOSIS — I350 Nonrheumatic aortic (valve) stenosis: Secondary | ICD-10-CM

## 2012-08-29 DIAGNOSIS — E7800 Pure hypercholesterolemia, unspecified: Secondary | ICD-10-CM

## 2012-08-29 DIAGNOSIS — I739 Peripheral vascular disease, unspecified: Secondary | ICD-10-CM

## 2012-08-29 DIAGNOSIS — I10 Essential (primary) hypertension: Secondary | ICD-10-CM

## 2012-08-29 LAB — LIPID PANEL
Cholesterol: 186 mg/dL (ref 0–200)
LDL Cholesterol: 103 mg/dL — ABNORMAL HIGH (ref 0–99)
Total CHOL/HDL Ratio: 3
Triglycerides: 44 mg/dL (ref 0.0–149.0)
VLDL: 8.8 mg/dL (ref 0.0–40.0)

## 2012-08-29 NOTE — Progress Notes (Signed)
Patient ID: Ronnie Boyd, male   DOB: 08/04/1928, 78 y.o.   MRN: [id number] PCP: Dr. Kriste Basque  78 yo with HTN, diet-controlled diabetes, hyperlipidemia, mild aortic stenosis, and PAD presents for cardiology followup.  Patient symptomatically does quite well.  He denies exertional dyspnea or chest pain.  He is active without significant limitation.  ABIs are mildly decreased but he does not get symptoms consistent with claudication.  Patient's BP has been under control, runs in the 130s systolic at home.  He is doing better on Crestor than on atorvastatin (less myalgias).  Weight is stable.   Labs (3/12): K 3.9, creatinine 1.0, TSH normal, LDL 150, HDL 76 Labs (6/12): LFTs normal, LDL 91, HDL 71 Labs (8/12): LDL 62, HDL 75 LBS (10/12): K 3.6, creatinine 1.1 Labs (5/13): LDL 75, HDL 73 Labs (10/13): K 3.7, creatinine 1.1  ECG: NSR, 1st degree AV block 210 msec, anterolateral shallow T wave inversions  PMH: 1. HTN 2. Hyperlipidemia 3. Diabetes mellitus: diet-controlled 4. Allergic rhinitis 5. Myoview (4/05): no ischemia or infarction. 6. PAD: ABIs 2005 with 0.92 on right, 0.78 on left.  Peripheral arterial evaluation (5/12): ABI 0.82 right/0.79 left, TBI 0.69 right/0.54 left (not at risk of tissue loss).  7. GERD 8. Prostatitis 9. S/p inguinal hernia repair. 10. Aortic stenosis: Mild by echo.  Echo (3/12) with mild LVH, mild to moderate focal basal septal hypertrophy with no LV outflow tract gradient, mild aortic stenosis, normal RV size and systolic function.  11. Carotid dopplers (5/12): 0-39% bilateral ICA stenosis.   SH: Retired, married, never smoked.    FH: Father with CVA in his 39s, no premature CAD, no AAA   Current Outpatient Prescriptions  Medication Sig Dispense Refill  . amLODipine (NORVASC) 10 MG tablet TAKE 1 TABLET EVERY DAY  30 tablet  6  . aspirin 81 MG tablet Take 81 mg by mouth daily.        . Coenzyme Q10 200 MG capsule Take one daily      . Garlic Oil 1000 MG  CAPS Take 1 capsule by mouth daily.        Marland Kitchen guaiFENesin (MUCINEX) 600 MG 12 hr tablet Take 1,200 mg by mouth as needed.        Marland Kitchen MICARDIS HCT 80-25 MG per tablet TAKE 1 TABLET EVERY DAY  30 tablet  6  . mometasone (NASONEX) 50 MCG/ACT nasal spray Place 2 sprays into the nose as needed.       . Multiple Vitamins-Minerals (CENTRUM SILVER) tablet Take 1 tablet by mouth daily.        . Omega-3 Fatty Acids (FISH OIL) 1000 MG CAPS Take 1 capsule by mouth daily.        . potassium chloride SA (KLOR-CON M20) 20 MEQ tablet Take 1 tablet (20 mEq total) by mouth daily.  90 tablet  3  . ranitidine (ZANTAC) 150 MG capsule Take 1 capsule (150 mg total) by mouth as needed.  90 capsule  3  . rosuvastatin (CRESTOR) 10 MG tablet Take 1 tablet (10 mg total) by mouth daily.  30 tablet  11  . Tamsulosin HCl (FLOMAX) 0.4 MG CAPS Take 1 capsule (0.4 mg total) by mouth as needed.  90 capsule  3    BP 140/78  Pulse 100  Ht 5\' 5"  (1.651 m)  Wt 183 lb 6.4 oz (83.19 kg)  BMI 30.52 kg/m2  SpO2 97% General: NAD Neck: No JVD, no thyromegaly or thyroid nodule.  Lungs: Clear to  auscultation bilaterally with normal respiratory effort. CV: Nondisplaced PMI.  Heart regular S1/S2, no S3/S4, 3/6 systolic crescendo-decrescendo murmur RUSB with clear S2.  No peripheral edema.  Soft carotid bruit L>R versus referred murmur from AS.  1+ left PT pulse, difficult to palpate R PT pulse.  Abdomen: Soft, nontender, no hepatosplenomegaly, no distention.  Neurologic: Alert and oriented x 3.  Psych: Normal affect. Extremities: No clubbing or cyanosis.   Assessment/Plan:  Aortic stenosis  Mild aortic stenosis by echo in 3/12. It is possible that statin use may decrease progression (though not definitive). Would aim for good BP control and statin use. Will get echo for progression in about 6 months.   HYPERCHOLESTEROLEMIA Good LDL on Crestor in 5/13.  Will repeat lipids today.  HYPERTENSION  BP is under reasonable control.   PERIPHERAL VASCULAR DISEASE Mildly decreased ABIs, moderately decreased TBIs but not at risk for tissue loss on ABIs in 2012. He does not have symptoms consistent with claudication. Continue aspirin and statin. I am going to repeat ABIs in the next couple of weeks to look for progression.   Marca Ancona 08/29/2012 11:57 AM

## 2012-08-29 NOTE — Patient Instructions (Addendum)
Your physician recommends that you have lab work today--lipid profile.  Your physician has requested that you have a lower extremity arterial duplex. This test is an ultrasound of the arteries in the legs . It looks at arterial blood flow in the legs. Allow one hour for Lower  Arterial scans. There are no restrictions or special instructions. In the next week or so.    Your physician wants you to follow-up in: 6 months with Dr Shirlee Latch. (June 2014). You will receive a reminder letter in the mail two months in advance. If you don't receive a letter, please call our office to schedule the follow-up appointment.

## 2012-09-01 ENCOUNTER — Other Ambulatory Visit: Payer: Self-pay

## 2012-09-01 DIAGNOSIS — E7800 Pure hypercholesterolemia, unspecified: Secondary | ICD-10-CM

## 2012-09-01 MED ORDER — ROSUVASTATIN CALCIUM 20 MG PO TABS: 20.0000 mg | ORAL_TABLET | Freq: Every day | ORAL | Status: DC

## 2012-09-02 ENCOUNTER — Encounter (INDEPENDENT_AMBULATORY_CARE_PROVIDER_SITE_OTHER): Payer: Medicare Other

## 2012-09-02 DIAGNOSIS — I739 Peripheral vascular disease, unspecified: Secondary | ICD-10-CM

## 2013-01-06 ENCOUNTER — Encounter: Payer: Self-pay | Admitting: Pulmonary Disease

## 2013-01-06 ENCOUNTER — Ambulatory Visit (INDEPENDENT_AMBULATORY_CARE_PROVIDER_SITE_OTHER): Payer: Medicare Other | Admitting: Pulmonary Disease

## 2013-01-06 VITALS — BP 142/72 | HR 89 | Temp 98.7°F | Ht 65.0 in | Wt 178.2 lb

## 2013-01-06 DIAGNOSIS — M1990 Unspecified osteoarthritis, unspecified site: Secondary | ICD-10-CM

## 2013-01-06 DIAGNOSIS — K219 Gastro-esophageal reflux disease without esophagitis: Secondary | ICD-10-CM

## 2013-01-06 DIAGNOSIS — E7800 Pure hypercholesterolemia, unspecified: Secondary | ICD-10-CM

## 2013-01-06 DIAGNOSIS — F411 Generalized anxiety disorder: Secondary | ICD-10-CM

## 2013-01-06 DIAGNOSIS — R7309 Other abnormal glucose: Secondary | ICD-10-CM

## 2013-01-06 DIAGNOSIS — I350 Nonrheumatic aortic (valve) stenosis: Secondary | ICD-10-CM

## 2013-01-06 DIAGNOSIS — I739 Peripheral vascular disease, unspecified: Secondary | ICD-10-CM

## 2013-01-06 DIAGNOSIS — I10 Essential (primary) hypertension: Secondary | ICD-10-CM

## 2013-01-06 DIAGNOSIS — Z87448 Personal history of other diseases of urinary system: Secondary | ICD-10-CM

## 2013-01-06 DIAGNOSIS — I359 Nonrheumatic aortic valve disorder, unspecified: Secondary | ICD-10-CM

## 2013-01-06 MED ORDER — ROSUVASTATIN CALCIUM 10 MG PO TABS: 10.0000 mg | ORAL_TABLET | Freq: Every day | ORAL | Status: DC

## 2013-01-06 MED ORDER — MOMETASONE FUROATE 50 MCG/ACT NA SUSP: 2.0000 | NASAL | Status: DC | PRN

## 2013-01-06 NOTE — Patient Instructions (Addendum)
Today we updated your med list in our EPIC system...    Continue your current medications the same...    We decided to change the Crestor back to 10mg  /d due to your intolerance of higher doses...  Please return to our lab one morning in about a month for your FASTING blood work back on the Crestor10mg /d...    We will contact you w/ the results when available...   Call for any questions...  Let's plan a follow up visit in 72mo, sooner if needed for problems.Marland KitchenMarland Kitchen

## 2013-01-06 NOTE — Progress Notes (Addendum)
Subjective:    Patient ID: Ronnie Boyd, male    DOB: 1927/02/10, 79 y.o.   MRN: [id number]  HPI 79 y/o BM here for a follow up visit... he has mult med problems as noted below...  ~  July 02, 2011:  39mo ROV & he continues to do well, no new complaints or concerns, wants cheaper Micardis & he was offered change to Losartan but he ultimately declined; ok Flu shot today...    HBP>  controlled on Amlodipine10, MicardisHCT80-25, KCl;  BP= 124/72 & similar at home; denies CP, palpit, SOB, etc; he has sys murmur & 2DEcho 3/12 showed mildLVH, norm wall motion w/ EF=65-70%, Gr1DD, calcif AoV leaflets w/ mild AS...    AS> 2DEcho results as noted; he saw DrMcLean 6/12 w/ rec for good BP control & statin Rx...    Periph Vasc dis> on ASA; he has decr ABIs & mild plaque in carotids (see studies below)...    Chol>  DrMcLean tried to incr his Lip40 to Lip80 but he c/o aches & pains, tried CoQ10, then decr back to 40mg /d on his own & feeling better; FLP was sl better on the 80mg  dose but he couldn't tolerate & prefers the 40mg  dose (see below)...    DM>  on diet alone & BS 97, A1c 6.7 which is improved; told he will need medication if he can't get wt down.  ~  January 01, 2012:  43mo ROV & [name] has had an uneventful interval- no new complaints or concerns x he feels that his Lip80 is causing leg cramps & he wants to switch meds; we discussed Rx w/ low dose CREATOR 10mg /d;      He saw DrMcLean 2/13 for Cards f/u> HBP, PAD, mild AS; denies CP, very active & no limitations, not c/o claudication, had to decr Lip40 to Qod due to leg cramps but his FLP was not at goal & he rec incr to 80mg  Qod but INTOL per pt & he wants to switch meds...    He had f/u Urology DrDahlstedt 2/13>  Asymptomatic stable renal cysts, PSA screening (he insists on continuing), LUTS on [facility], ED on Levitra; they tried to switch him to [facility]...    We reviewed prob list, meds, xrays and labs> see below>> LABS 4/13:  Chems- wnl x BS=111  A1c=6.9;  CBC- wnl;  TSH=1.62  ~  July 07, 2012:  43mo ROV & [name] is stable- no new complaints or concerns;  BP controlled on regimen & measures 130/72 today- denies CP, palpit, SOB, edema, we discussed poss switch to Losartan to save $$ & he will let me know;  He is followed by [facility] for Cards- known AS, seen 2/13 doing well, no dizziness or syncope etc;  Chol is well regulated w/ Cres10 & he really needs to lose some weight- we discussed this again today;  DM however is adeq controlled on his low carb diet w/ BS94 & A1c6.8.Marland KitchenMarland Kitchen    We reviewed prob list, meds, xrays and labs> see below for updates >> OK 2013 Flu vaccine today... LABS 10/13:  Chems- wnl w/ BS=94, A1c=6.8 on diet alone...  ~  January 06, 2013:  43mo ROV & [name] notes that DrMcLean increased his Crestor to 20mg /d but he feels he is intol w/ incr leg cramps so he decr himself to 20mg  Qod & improved; We reviewed the following medical problems during today's office visit >>     HBP> controlled on Amlodipine10, MicardisHCT80-25, K20; BP= 142/72 &  similar at home; denies CP, palpit, SOB, etc; he has sys murmur & 2DEcho 3/12 showed mildLVH, norm wall motion w/ EF=65-70%, Gr1DD, calcif AoV leaflets w/ mild AS...    AS> 2DEcho results as noted; he saw [facility] 12/13 w/ rec for good BP control & incr Crestor to 20mg /d...    Periph Vasc dis> on ASA; he has decr ABIs & mild plaque in carotids (see studies below)...    Chol> on Cres20; DrMcLean incr Cres10 to 20 12/13 but pt states it caused incr leg cramps and he decr to 20mg Qod on his own, he wants to ret to 10mg /d...    DM> on diet alone & last labs 10/13 showed BS=64, A1c=6.8    GI- GERD, Hems> on Zantac150 prn; last colon 2004 was neg x hems; denies abd pain, dysphagia, n/v, c/d, blood seen...    Hx of prostatitis, renal cysts> on Flomax.4 & Levitra for ED; he had f/u DrDahlstedt 2/14- pt requests yearly f/u w/ PSAs...    DJD> on OTC analgesics as needed... We reviewed prob list, meds,  xrays and labs> see below for updates >>  ADDENDUM>> LABS done 02/03/13>> FLP- at goals on Cres10;  Chems, CBC, Thyroid> all wnl...         Problem List:      ALLERGIC RHINITIS (ICD-477.9) - he uses OTC Zyrtek & Mucinex, and knows to avoid pseudophed...  HYPERTENSION (ICD-401.9) - on NORVASC 10mg /d, MICARDIS/Hct 80-25 daily, & K20/d...  ~  NuclearStressTest 4/05 was norm- no infarct or iscemia, EF=72%... ~  CXR 3/10 showed normal heart size, clear lungs, NAD... ~  2DEcho 3/12 showed mildLVH, norm wall motion w/ EF=65-70%, Gr1DD, calcif AoV leaflets w/ mild AS... ~  10/12:  BP= 124/72, feeling well, taking meds regularly, no reported side-effects... home checks are all good per pt- "even better than here"... denies HA, fatigue, visual changes, CP, palipit, dizziness, syncope, dyspnea, edema, etc... ~  4/13:  BP= 122/62 & he remains largely asymptomatic... ~  10/13:  BP= 130/72 & he remains largely asymptomatic... ~  4/14:  BP= 142/72 and he remains largely asymptomatic...  AORTIC STENOSIS >> he is followed for Cards by DrMcLean... ~  EKG 4/12 showed NSR, NSSTTWA, NAD... ~  2DEcho 3/12 showed mildLVH, norm wall motion w/ EF=65-70%, Gr1DD, calcif AoV leaflets w/ mild AS... ~  EKG 2/13 showed NSR, rate75, infer infarct age undetermined...  PERIPHERAL VASCULAR DISEASE (ICD-443.9) - on ASA 81mg /d... no leg pain or claudic symptoms. ~  ABI's 2005 showed .92R & .78L... ~  ABI's 5/12 showed .82R & .79L (note- toe-brachial indicies are further reduced but not at risk of tissue loss). ~  CDopplers 5/12 showed mild heterog plaque, 0-39% bilat ICA stenoses... ~  ABI's 12/13 showed .84R & .97L (improved from prev); toe pressures are abn but adeq for tissue healing...  HYPERCHOLESTEROLEMIA (ICD-272.0) - prev on LIPITOR 40mg /d + FISH OIL & Garlic pills... ~  FLP 8/08 on Simva20 showed TChol 199, TG 33, HDL 81, LDL 111 ~  FLP 3/09 on Simva20 showed TChol 211, TG 37, HDL 91, LDL 110... great HDL!!! ~   FLP 9/09 on Simva20 showed TChol 190, TG 34, HDL 77, LDL 107 ~  FLP 3/10 on Simva20 showed TChol 238, Tg 35, HDL 76, LDL 138... rec> incr Simva40. ~  FLP 9/10 on Simva40 showed TChol 206, TG 27, HDL 81, LDL 108 ~  FLP 3/11 on Simva40 showed TChol 189, TG 32, HDL 83, LDL 98 ~  FLP 9/11 on  Simva40 showed TChol 222, TG 33, HDL 76, LDL 136... not at goal, rec> ch to Lipitor40. ~  FLP 6/12 on Lip40 showed TChol 167, TG 27, HDL 71, LDL 91... DrMcLean incr to Lip80. ~  FLP 8/12 on Lip80 showed TChol 142, TG 25, HDL 76, LDL 62... But pt INTOL & returned to 40mg /d. ~  Pt started taking the Lip Qod due to leg cramps & not at goal on Lip40Qod; DrMcLean changed to Lip80 Qod but intol. ~  4/13:  We discussed his dilemma & decided to change to CRESTOR 10mg /d regularly=> FLP showed TChol 153, TG 22, HDL 75, LDL 73 ~  12/13:  DrMcLean rechecked FLP on Cres10> TChol 186, TG 44, HDL 74, LDL 103... He rec incr to Cres20... ~  5/14: pt reports that he was intol to Cres20 & changed to 20Qod on his own, he prefers to ret to Cres10/d as before...  DIABETES MELLITUS, BORDERLINE (ICD-790.29) - on diet alone & we discussed diet + exercise program. ~  labs 8/08 showed BS=107, HgA1c=6.4 on diet alone... rec to lose some weight. ~  labs 3/09 (wt=177#)showed BS= 94, HgA1c= 6.6.Marland KitchenMarland Kitchen rec- better diet efforts... ~  labs 9/09 (wt=176#) showed BS= 101, HgA1c= 6.6 ~  labs 3/10 (wt=181#) showed BS= 108, A1c= 6.7 ~  labs 9/10 (wt=181#) showed BS= 107, A1c= 6.6 ~  labs 3/11 (wt=181#) showed BS= 103 ~  labs 9/11 (wt=178##) showed BS= 96, A1c 6.9 ~  Labs 3/12 (wt=182#) showed BS= 96, A1c= 7.1 ~  Labs 10/12 (wt=177#) showed BS= 97, A1c= 6.7 ~  Labs 4/13 (wt=182#) showed BS= 111, A1c= 6.9 ~  Labs 10/13 (wt=182#) showed BS= 94, A1c= 6.8  GERD (ICD-530.81) - he takes Zantac 150mg /d as needed...  HEMORRHOIDS (ICD-455.6) - last colonoscopy was 4/04 by DrPatterson and norm x for hems...  PROSTATITIS, HX OF (ICD-V13.09) - followed by  DrDahlstadt & seen 9/09- note reviewed... he has BPH,  renal cyst, ED... Rx w/ Viagra as needed. ~  labs 3/09 showed PSA= 0.69 ~  labs 3/10 showed PSA= 0.66 ~  labs 3/11 showed PSA= 0.69 ~  Labs 3/12 showed PSA= 0.76 ~  PSA has been followed by Urology, DrDahlstedt.  DEGENERATIVE JOINT DISEASE (ICD-715.90)  HEALTH MAINTENANCE - he gets the yearly seasonal flu vaccines... [facility]age 12[facility]... had Tetanus shot in ER ~2006...   Past Surgical History  Procedure Laterality Date  . Inguinal hernia repair  1989  . Carpal tunnel release      bilateral    Outpatient Encounter Prescriptions as of 01/06/2013  Medication Sig Dispense Refill  . amLODipine (NORVASC) 10 MG tablet TAKE 1 TABLET EVERY DAY  30 tablet  6  . aspirin 81 MG tablet Take 81 mg by mouth daily.        . Coenzyme Q10 200 MG capsule Take one daily      . Garlic Oil 1000 MG CAPS Take 1 capsule by mouth daily.        Marland Kitchen guaiFENesin (MUCINEX) 600 MG 12 hr tablet Take 1,200 mg by mouth as needed.        Marland Kitchen MICARDIS HCT 80-25 MG per tablet TAKE 1 TABLET EVERY DAY  30 tablet  6  . mometasone (NASONEX) 50 MCG/ACT nasal spray Place 2 sprays into the nose as needed.       . Multiple Vitamins-Minerals (CENTRUM SILVER) tablet Take 1 tablet by mouth daily.        Marland Kitchen  Omega-3 Fatty Acids (FISH OIL) 1000 MG CAPS Take 1 capsule by mouth daily.        . potassium chloride SA (KLOR-CON M20) 20 MEQ tablet Take 1 tablet (20 mEq total) by mouth daily.  90 tablet  3  . ranitidine (ZANTAC) 150 MG capsule Take 1 capsule (150 mg total) by mouth as needed.  90 capsule  3  . rosuvastatin (CRESTOR) 20 MG tablet Take 20 mg by mouth every other day.      . Tamsulosin HCl (FLOMAX) 0.4 MG CAPS Take 1 capsule (0.4 mg total) by mouth as needed.  90 capsule  3  . [DISCONTINUED] rosuvastatin (CRESTOR) 20 MG tablet Take 1 tablet (20 mg total) by mouth daily.  30 tablet  11   No facility-administered encounter medications on file  as of 01/06/2013.    No Known Allergies   Current Medications, Allergies, Past Medical History, Past Surgical History, Family History, and Social History were reviewed in [employer] record.   Review of Systems        See HPI - all other systems neg except as noted...  The patient complains of dyspnea on exertion.  The patient denies anorexia, fever, weight loss, weight gain, vision loss, decreased hearing, hoarseness, chest pain, syncope, peripheral edema, prolonged cough, headaches, hemoptysis, abdominal pain, melena, hematochezia, severe indigestion/heartburn, hematuria, incontinence, muscle weakness, suspicious skin lesions, transient blindness, difficulty walking, depression, unusual weight change, abnormal bleeding, enlarged lymph nodes, and angioedema.     Objective:   Physical Exam     WD, Overweight, 79 y/o BM in NAD... GENERAL:  Alert & oriented; pleasant & cooperative... HEENT:  [place] /AT, EOM-wnl, PERRLA, EACs-clear, TMs-wnl, NOSE-clear, THROAT-clear & wnl. NECK:  Supple w/ fair ROM; no JVD; normal carotid impulses w/o bruits; no thyromegaly or nodules palpated; no lymphadenopathy. CHEST:  Clear to P & A; without wheezes/ rales/ or rhonchi. HEART:  Regular Rhythm; without murmurs/ rubs/ or gallops. ABDOMEN:  Soft & nontender; normal bowel sounds; no organomegaly or masses detected. EXT: without deformities, mild arthritic changes; no varicose veins/ +venous insuffic/ no edema. NEURO:  CN's intact; motor testing normal; sensory testing normal; gait normal & balance OK. DERM:  No lesions noted; no rash etc...  RADIOLOGY DATA:  Reviewed in the EPIC EMR & discussed w/ the patient...  LABORATORY DATA:  Reviewed in the EPIC EMR & discussed w/ the patient...   Assessment & Plan:    HBP>  BP remains well controlled on Norvasc & MicardisHCT; continue same + diet efforts...  AS>  2DEcho 3/12 w/ mild AS; he was eval by DrMcLean who rec good BP & chol  control...  ASPVD>  He has decr ABIs but no claudication; CDopplers are ok as well & he is w/o cerebral ischemic symptoms...  CHOL>  He was intol to Lip80 given by [facility]; he was switched to Cres10 w/ good control of lipids...  DM>  On diet alone w/ A1c=6.8 & we reviewed low carbs & need for wt reduction...  GI>  Stable & up to date...  GU>  Stable & PSA's are wnl...  DJD>  Aware, and he manages well w/ OTC meds as needed...   Patient's Medications  New Prescriptions   ROSUVASTATIN (CRESTOR) 10 MG TABLET    Take 1 tablet (10 mg total) by mouth daily.  Previous Medications   AMLODIPINE (NORVASC) 10 MG TABLET    TAKE 1 TABLET EVERY DAY   ASPIRIN 81 MG TABLET    Take 81  mg by mouth daily.     COENZYME Q10 200 MG CAPSULE    Take one daily   GARLIC OIL 1000 MG CAPS    Take 1 capsule by mouth daily.     GUAIFENESIN (MUCINEX) 600 MG 12 HR TABLET    Take 1,200 mg by mouth as needed.     MICARDIS HCT 80-25 MG PER TABLET    TAKE 1 TABLET EVERY DAY   MULTIPLE VITAMINS-MINERALS (CENTRUM SILVER) TABLET    Take 1 tablet by mouth daily.     OMEGA-3 FATTY ACIDS (FISH OIL) 1000 MG CAPS    Take 1 capsule by mouth daily.     POTASSIUM CHLORIDE SA (KLOR-CON M20) 20 MEQ TABLET    Take 1 tablet (20 mEq total) by mouth daily.   RANITIDINE (ZANTAC) 150 MG CAPSULE    Take 1 capsule (150 mg total) by mouth as needed.   TAMSULOSIN HCL (FLOMAX) 0.4 MG CAPS    Take 1 capsule (0.4 mg total) by mouth as needed.  Modified Medications   Modified Medication Previous Medication   MOMETASONE (NASONEX) 50 MCG/ACT NASAL SPRAY mometasone (NASONEX) 50 MCG/ACT nasal spray      Place 2 sprays into the nose as needed.    Place 2 sprays into the nose as needed.   Discontinued Medications   ROSUVASTATIN (CRESTOR) 20 MG TABLET    Take 1 tablet (20 mg total) by mouth daily.   ROSUVASTATIN (CRESTOR) 20 MG TABLET    Take 20 mg by mouth every other day.

## 2013-02-03 ENCOUNTER — Other Ambulatory Visit (INDEPENDENT_AMBULATORY_CARE_PROVIDER_SITE_OTHER): Payer: Medicare Other

## 2013-02-03 DIAGNOSIS — E7800 Pure hypercholesterolemia, unspecified: Secondary | ICD-10-CM

## 2013-02-03 DIAGNOSIS — I359 Nonrheumatic aortic valve disorder, unspecified: Secondary | ICD-10-CM

## 2013-02-03 DIAGNOSIS — F411 Generalized anxiety disorder: Secondary | ICD-10-CM

## 2013-02-03 DIAGNOSIS — I350 Nonrheumatic aortic (valve) stenosis: Secondary | ICD-10-CM

## 2013-02-03 DIAGNOSIS — I10 Essential (primary) hypertension: Secondary | ICD-10-CM

## 2013-02-03 LAB — CBC WITH DIFFERENTIAL/PLATELET
Basophils Absolute: 0 10*3/uL (ref 0.0–0.1)
Eosinophils Relative: 3.8 % (ref 0.0–5.0)
Lymphs Abs: 1.7 10*3/uL (ref 0.7–4.0)
Monocytes Relative: 16.5 % — ABNORMAL HIGH (ref 3.0–12.0)
Neutrophils Relative %: 49.3 % (ref 43.0–77.0)
Platelets: 164 10*3/uL (ref 150.0–400.0)
RDW: 16.1 % — ABNORMAL HIGH (ref 11.5–14.6)
WBC: 5.8 10*3/uL (ref 4.5–10.5)

## 2013-02-03 LAB — LIPID PANEL
Cholesterol: 165 mg/dL (ref 0–200)
LDL Cholesterol: 81 mg/dL (ref 0–99)
Triglycerides: 36 mg/dL (ref 0.0–149.0)

## 2013-02-03 LAB — BASIC METABOLIC PANEL
BUN: 14 mg/dL (ref 6–23)
CO2: 26 mEq/L (ref 19–32)
Chloride: 105 mEq/L (ref 96–112)
Creatinine, Ser: 1.1 mg/dL (ref 0.4–1.5)
Glucose, Bld: 84 mg/dL (ref 70–99)
Potassium: 3.7 mEq/L (ref 3.5–5.1)

## 2013-02-03 LAB — HEPATIC FUNCTION PANEL
ALT: 19 U/L (ref 0–53)
AST: 26 U/L (ref 0–37)
Albumin: 3.9 g/dL (ref 3.5–5.2)
Total Protein: 7.3 g/dL (ref 6.0–8.3)

## 2013-02-03 LAB — TSH: TSH: 1.41 u[IU]/mL (ref 0.35–5.50)

## 2013-02-05 ENCOUNTER — Telehealth: Payer: Self-pay | Admitting: Pulmonary Disease

## 2013-02-05 NOTE — Telephone Encounter (Signed)
Please notify patient> Labs done 5/27 look good... FLP is normal/ at goals on Crestor- continue same... Chems, LFTs, CBC, thyroid> all wnl...  I spoke with patient about results and he verbalized understanding and had no questions

## 2013-02-27 ENCOUNTER — Ambulatory Visit (INDEPENDENT_AMBULATORY_CARE_PROVIDER_SITE_OTHER): Payer: Medicare Other | Admitting: Internal Medicine

## 2013-02-27 VITALS — BP 132/60 | HR 75 | Temp 97.8°F | Resp 16 | Ht 65.0 in | Wt 175.0 lb

## 2013-02-27 DIAGNOSIS — J309 Allergic rhinitis, unspecified: Secondary | ICD-10-CM

## 2013-02-27 DIAGNOSIS — H6120 Impacted cerumen, unspecified ear: Secondary | ICD-10-CM

## 2013-02-27 DIAGNOSIS — H6123 Impacted cerumen, bilateral: Secondary | ICD-10-CM

## 2013-02-27 MED ORDER — FLUTICASONE PROPIONATE 50 MCG/ACT NA SUSP: NASAL | Status: DC

## 2013-02-27 NOTE — Progress Notes (Signed)
  Subjective:    Patient ID: Ronnie Boyd, male    DOB: 1927-04-15, 79 y.o.   MRN: [id number]  HPIc/o muffled hearing on L for several days Hx recur cerumen in past Recent sinus congestion also w/out allergy sxt but with incr snoring  Doing well in general Patient Active Problem List   Diagnosis Date Noted  . Aortic stenosis 01/08/2011  . DIABETES MELLITUS, BORDERLINE 12/02/2007  . HYPERCHOLESTEROLEMIA 12/01/2007  . HYPERTENSION 12/01/2007  . PERIPHERAL VASCULAR DISEASE 12/01/2007  . HEMORRHOIDS 12/01/2007  . ALLERGIC RHINITIS 12/01/2007  . GERD 12/01/2007  . DEGENERATIVE JOINT DISEASE 12/01/2007  . PROSTATITIS, HX OF 12/01/2007  Current outpatient prescriptions:amLODipine (NORVASC) 10 MG tablet, TAKE 1 TABLET EVERY DAY, Disp: 30 tablet, Rfl: 6;  aspirin 81 MG tablet, Take 81 mg by mouth daily.  , Disp: , Rfl: ;  Coenzyme Q10 200 MG capsule, Take one daily, Disp: , Rfl: ;  Garlic Oil 1000 MG CAPS, Take 1 capsule by mouth daily.  , Disp: , Rfl: ;  guaiFENesin (MUCINEX) 600 MG 12 hr tablet, Take 1,200 mg by mouth as needed.  , Disp: , Rfl:  MICARDIS HCT 80-25 MG per tablet, TAKE 1 TABLET EVERY DAY, Disp: 30 tablet, Rfl: 6;  mometasone (NASONEX) 50 MCG/ACT nasal spray, Place 2 sprays into the nose as needed., Disp: 17 g, Rfl: 11;  Multiple Vitamins-Minerals (CENTRUM SILVER) tablet, Take 1 tablet by mouth daily.  , Disp: , Rfl: ;  Omega-3 Fatty Acids (FISH OIL) 1000 MG CAPS, Take 1 capsule by mouth daily.  , Disp: , Rfl:  potassium chloride SA (KLOR-CON M20) 20 MEQ tablet, Take 1 tablet (20 mEq total) by mouth daily., Disp: 90 tablet, Rfl: 3;  ranitidine (ZANTAC) 150 MG capsule, Take 1 capsule (150 mg total) by mouth as needed., Disp: 90 capsule, Rfl: 3;  rosuvastatin (CRESTOR) 10 MG tablet, Take 1 tablet (10 mg total) by mouth daily., Disp: 30 tablet, Rfl: 11 Tamsulosin HCl (FLOMAX) 0.4 MG CAPS, Take 1 capsule (0.4 mg total) by mouth as needed., Disp: 90 capsule, Rfl: 3      Review of  Systems     Objective:   Physical Exam BP 132/60  Pulse 75  Temp(Src) 97.8 F (36.6 C) (Oral)  Resp 16  Ht 5\' 5"  (1.651 m)  Wt 175 lb (79.379 kg)  BMI 29.12 kg/m2  SpO2 99% Conjunctiva clear Both canals impacted with cerumen, left greater than right Nares boggy with no purulence   Ear irrigation=successful///canals clear tympanic membranes intact without inflammation       Assessment & Plan:  Problem #1 cerumen impaction Problem #2 allergic rhinitis with congestion and snoring  Add Flonase

## 2013-03-25 ENCOUNTER — Telehealth: Payer: Self-pay | Admitting: Pulmonary Disease

## 2013-03-25 NOTE — Telephone Encounter (Signed)
Per SN---maybe ENT could see him sooner.  Continue what he is doing---nasal saline spray every 1-2 hours.

## 2013-03-25 NOTE — Telephone Encounter (Signed)
I spoke w/ pt. He c/o ringing in ears, dizziness, slight forehead pressure off and on x couple weeks. No fever, no cough, no drainage. He has been taking mucinex, zyrtec, and a nasal spray he is using. Pt scheduled OV for 03/27/13 w/ SN at 9 AM. Pt is requetsing recs prior to then. Please advise thanks Last OV 01/06/13 No Known Allergies

## 2013-03-25 NOTE — Telephone Encounter (Signed)
Pt aware of recs. Nothing further was needed 

## 2013-03-27 ENCOUNTER — Encounter: Payer: Self-pay | Admitting: Pulmonary Disease

## 2013-03-27 ENCOUNTER — Ambulatory Visit (INDEPENDENT_AMBULATORY_CARE_PROVIDER_SITE_OTHER)
Admission: RE | Admit: 2013-03-27 | Discharge: 2013-03-27 | Disposition: A | Discharge status: Home / Self Care | Payer: Medicare Other | Source: Ambulatory Visit | Attending: Pulmonary Disease | Admitting: Pulmonary Disease

## 2013-03-27 ENCOUNTER — Ambulatory Visit (INDEPENDENT_AMBULATORY_CARE_PROVIDER_SITE_OTHER): Payer: Medicare Other | Admitting: Pulmonary Disease

## 2013-03-27 VITALS — BP 130/60 | HR 80 | Temp 97.2°F | Ht 65.0 in | Wt 182.8 lb

## 2013-03-27 DIAGNOSIS — J019 Acute sinusitis, unspecified: Secondary | ICD-10-CM

## 2013-03-27 DIAGNOSIS — E7800 Pure hypercholesterolemia, unspecified: Secondary | ICD-10-CM

## 2013-03-27 DIAGNOSIS — I739 Peripheral vascular disease, unspecified: Secondary | ICD-10-CM

## 2013-03-27 DIAGNOSIS — J309 Allergic rhinitis, unspecified: Secondary | ICD-10-CM

## 2013-03-27 DIAGNOSIS — I10 Essential (primary) hypertension: Secondary | ICD-10-CM

## 2013-03-27 DIAGNOSIS — I350 Nonrheumatic aortic (valve) stenosis: Secondary | ICD-10-CM

## 2013-03-27 DIAGNOSIS — I359 Nonrheumatic aortic valve disorder, unspecified: Secondary | ICD-10-CM

## 2013-03-27 MED ORDER — METHYLPREDNISOLONE ACETATE 80 MG/ML IJ SUSP
80.0000 mg | Freq: Once | INTRAMUSCULAR | Status: AC
  Administered 2013-03-27: 80 mg via INTRAMUSCULAR

## 2013-03-27 MED ORDER — AMOXICILLIN-POT CLAVULANATE 875-125 MG PO TABS: 1.0000 | ORAL_TABLET | Freq: Two times a day (BID) | ORAL | Status: DC

## 2013-03-27 MED ORDER — PREDNISONE 20 MG PO TABS: ORAL_TABLET | ORAL | Status: DC

## 2013-03-27 NOTE — Progress Notes (Signed)
Subjective:    Patient ID: Ronnie Boyd, male    DOB: 1927-05-02, 79 y.o.   MRN: [id number]  HPI 79 y/o BM here for a follow up visit... he has mult med problems as noted below...  ~  July 02, 2011:  67mo ROV & he continues to do well, no new complaints or concerns, wants cheaper Micardis & he was offered change to Losartan but he ultimately declined; ok Flu shot today...    HBP>  controlled on Amlodipine10, MicardisHCT80-25, KCl;  BP= 124/72 & similar at home; denies CP, palpit, SOB, etc; he has sys murmur & 2DEcho 3/12 showed mildLVH, norm wall motion w/ EF=65-70%, Gr1DD, calcif AoV leaflets w/ mild AS...    AS> 2DEcho results as noted; he saw DrMcLean 6/12 w/ rec for good BP control & statin Rx...    Periph Vasc dis> on ASA; he has decr ABIs & mild plaque in carotids (see studies below)...    Chol>  DrMcLean tried to incr his Lip40 to Lip80 but he c/o aches & pains, tried CoQ10, then decr back to 40mg /d on his own & feeling better; FLP was sl better on the 80mg  dose but he couldn't tolerate & prefers the 40mg  dose (see below)...    DM>  on diet alone & BS 97, A1c 6.7 which is improved; told he will need medication if he can't get wt down.  ~  January 01, 2012:  19mo ROV & Ronnie Boyd- no new complaints or concerns x he feels Boyd his Lip80 is causing leg cramps & he wants to switch meds; we discussed Rx w/ low dose CREATOR 10mg /d;      He saw DrMcLean 2/13 for Cards f/u> HBP, PAD, mild AS; denies CP, very active & no limitations, not c/o claudication, had to decr Lip40 to Qod due to leg cramps but his FLP was not at goal & he rec incr to 80mg  Qod but INTOL per pt & he wants to switch meds...    He had f/u Urology DrDahlstedt 2/13>  Asymptomatic Boyd renal cysts, PSA screening (he insists on continuing), LUTS on [facility], ED on Levitra; they tried to switch him to [facility]...    We reviewed prob list, meds, xrays and labs> see below>> LABS 4/13:  Chems- wnl x BS=111  A1c=6.9;  CBC- wnl;  TSH=1.62  ~  July 07, 2012:  19mo ROV & Ronnie Boyd- no new complaints or concerns;  BP controlled on regimen & measures 130/72 today- denies CP, palpit, SOB, edema, we discussed poss switch to Losartan to save $$ & he will let me know;  He is followed by [facility] for Cards- known AS, seen 2/13 doing well, no dizziness or syncope etc;  Chol is well regulated w/ Cres10 & he really needs to lose some weight- we discussed this again today;  DM however is adeq controlled on his low carb diet w/ BS94 & A1c6.8.Marland KitchenMarland Kitchen    We reviewed prob list, meds, xrays and labs> see below for updates >> OK 2013 Flu vaccine today... LABS 10/13:  Chems- wnl w/ BS=94, A1c=6.8 on diet alone...  ~  January 06, 2013:  19mo ROV & Ronnie Boyd to 20mg /d but he feels he is intol w/ incr leg cramps so he decr himself to 20mg  Qod & improved; We reviewed the following medical problems during today's office visit >>     HBP> controlled on Amlodipine10, MicardisHCT80-25, K20; BP= 142/72 &  similar at home; denies CP, palpit, SOB, etc; he has sys murmur & 2DEcho 3/12 showed mildLVH, norm wall motion w/ EF=65-70%, Gr1DD, calcif AoV leaflets w/ mild AS...    AS> 2DEcho results as noted; he saw [facility] 12/13 w/ rec for good BP control & incr Boyd to 20mg /d...    Periph Vasc dis> on ASA; he has decr ABIs & mild plaque in carotids (see studies below)...    Chol> on Cres20; DrMcLean incr Cres10 to 20 12/13 but pt states it caused incr leg cramps and he decr to 20mg Qod on his own, he wants to ret to 10mg /d...    DM> on diet alone & last labs 10/13 showed BS=64, A1c=6.8    GI- GERD, Hems> on Zantac150 prn; last colon 2004 was neg x hems; denies abd pain, dysphagia, n/v, c/d, blood seen...    Hx of prostatitis, renal cysts> on Flomax.4 & Levitra for ED; he had f/u DrDahlstedt 2/14- pt requests yearly f/u w/ PSAs...    DJD> on OTC analgesics as needed... We reviewed prob list, meds,  xrays and labs> see below for updates >>  ADDENDUM>> LABS done 02/03/13>> FLP- at goals on Cres10;  Chems, CBC, Thyroid> all wnl...  ~  March 27, 2013:  2-97mo ROV & add-on appt for sinusitis> c/o sinus congestion, drainage, sm amt yellow phlegm; mild frontal HA but denies f/c/s; he saw DrDoolittle at [facility] 6/20 w/ the same symptoms and dizziness- ears lavaged & given Flonase; requested this visit for persistent symptoms> Sinus XRays w/ hypoplastic frontal sinuses, no AF levels; we decided to treat w/ Depo80, Pred taper, Augmentin + Align, Mucinex 2Bid, Fluids po & Nasal Saline Q1-2h... If not responding we discussed ENT vs LeB allergy & sinus care....    We reviewed prob list, meds, xrays and labs> see below for updates >>           Problem List:      ALLERGIC RHINITIS (ICD-477.9) - he uses OTC Zyrtek & Mucinex, and knows to avoid pseudophed... ~  7/14: presented w/ sinusitis & XRay showing hypoplastic frontals, otherw neg; Rx w/ Depo, Pred, Augmentin+Align, Mucinex, Saline/Flonase...  HYPERTENSION (ICD-401.9) - on NORVASC 10mg /d, MICARDIS/Hct 80-25 daily, & K20/d...  ~  NuclearStressTest 4/05 was norm- no infarct or iscemia, EF=72%... ~  CXR 3/10 showed normal heart size, clear lungs, NAD... ~  2DEcho 3/12 showed mildLVH, norm wall motion w/ EF=65-70%, Gr1DD, calcif AoV leaflets w/ mild AS... ~  10/12:  BP= 124/72, feeling well, taking meds regularly, no reported side-effects... home checks are all good per pt- "even better than here"... denies HA, fatigue, visual changes, CP, palipit, dizziness, syncope, dyspnea, edema, etc... ~  4/13:  BP= 122/62 & he remains largely asymptomatic... ~  10/13:  BP= 130/72 & he remains largely asymptomatic... ~  4/14:  BP= 142/72 and he remains largely asymptomatic...  AORTIC STENOSIS >> he is followed for Cards by DrMcLean... ~  EKG 4/12 showed NSR, NSSTTWA, NAD... ~  2DEcho 3/12 showed mildLVH, norm wall motion w/ EF=65-70%, Gr1DD, calcif AoV leaflets w/ mild  AS... ~  EKG 2/13 showed NSR, rate75, infer infarct age undetermined...  PERIPHERAL VASCULAR DISEASE (ICD-443.9) - on ASA 81mg /d... no leg pain or claudic symptoms. ~  ABI's 2005 showed .92R & .78L... ~  ABI's 5/12 showed .82R & .79L (note- toe-brachial indicies are further reduced but not at risk of tissue loss). ~  CDopplers 5/12 showed mild heterog plaque, 0-39% bilat ICA stenoses... ~  ABI's 12/13 showed .  84R & .97L (improved from prev); toe pressures are abn but adeq for tissue healing...  HYPERCHOLESTEROLEMIA (ICD-272.0) - prev on LIPITOR 40mg /d + FISH OIL & Garlic pills... ~  FLP 8/08 on Simva20 showed TChol 199, TG 33, HDL 81, LDL 111 ~  FLP 3/09 on Simva20 showed TChol 211, TG 37, HDL 91, LDL 110... great HDL!!! ~  FLP 9/09 on Simva20 showed TChol 190, TG 34, HDL 77, LDL 107 ~  FLP 3/10 on Simva20 showed TChol 238, Tg 35, HDL 76, LDL 138... rec> incr Simva40. ~  FLP 9/10 on Simva40 showed TChol 206, TG 27, HDL 81, LDL 108 ~  FLP 3/11 on Simva40 showed TChol 189, TG 32, HDL 83, LDL 98 ~  FLP 9/11 on Simva40 showed TChol 222, TG 33, HDL 76, LDL 136... not at goal, rec> ch to Lipitor40. ~  FLP 6/12 on Lip40 showed TChol 167, TG 27, HDL 71, LDL 91... DrMcLean incr to Lip80. ~  FLP 8/12 on Lip80 showed TChol 142, TG 25, HDL 76, LDL 62... But pt INTOL & returned to 40mg /d. ~  Pt started taking the Lip Qod due to leg cramps & not at goal on Lip40Qod; DrMcLean changed to Lip80 Qod but intol. ~  4/13:  We discussed his dilemma & decided to change to Boyd 10mg /d regularly=> FLP showed TChol 153, TG 22, HDL 75, LDL 73 ~  12/13:  DrMcLean rechecked FLP on Cres10> TChol 186, TG 44, HDL 74, LDL 103... He rec incr to Cres20... ~  5/14: pt reports Boyd he was intol to Cres20 & changed to 20Qod on his own, he prefers to ret to Cres10/d as before...  DIABETES MELLITUS, BORDERLINE (ICD-790.29) - on diet alone & we discussed diet + exercise program. ~  labs 8/08 showed BS=107, HgA1c=6.4 on diet  alone... rec to lose some weight. ~  labs 3/09 (wt=177#)showed BS= 94, HgA1c= 6.6.Marland KitchenMarland Kitchen rec- better diet efforts... ~  labs 9/09 (wt=176#) showed BS= 101, HgA1c= 6.6 ~  labs 3/10 (wt=181#) showed BS= 108, A1c= 6.7 ~  labs 9/10 (wt=181#) showed BS= 107, A1c= 6.6 ~  labs 3/11 (wt=181#) showed BS= 103 ~  labs 9/11 (wt=178##) showed BS= 96, A1c 6.9 ~  Labs 3/12 (wt=182#) showed BS= 96, A1c= 7.1 ~  Labs 10/12 (wt=177#) showed BS= 97, A1c= 6.7 ~  Labs 4/13 (wt=182#) showed BS= 111, A1c= 6.9 ~  Labs 10/13 (wt=182#) showed BS= 94, A1c= 6.8  GERD (ICD-530.81) - he takes Zantac 150mg /d as needed...  HEMORRHOIDS (ICD-455.6) - last colonoscopy was 4/04 by DrPatterson and norm x for hems...  PROSTATITIS, HX OF (ICD-V13.09) - followed by DrDahlstadt & seen 9/09- note reviewed... he has BPH,  renal cyst, ED... Rx w/ Viagra as needed. ~  labs 3/09 showed PSA= 0.69 ~  labs 3/10 showed PSA= 0.66 ~  labs 3/11 showed PSA= 0.69 ~  Labs 3/12 showed PSA= 0.76 ~  PSA has been followed by Urology, DrDahlstedt.  DEGENERATIVE JOINT DISEASE (ICD-715.90)  HEALTH MAINTENANCE - he gets the yearly seasonal flu vaccines... [facility] (age 62[facility]... had Tetanus shot in ER ~2006...   Past Surgical History  Procedure Laterality Date  . Inguinal hernia repair  1989  . Carpal tunnel release      bilateral    Outpatient Encounter Prescriptions as of 03/27/2013  Medication Sig Dispense Refill  . amLODipine (NORVASC) 10 MG tablet TAKE 1 TABLET EVERY DAY  30 tablet  6  .  aspirin 81 MG tablet Take 81 mg by mouth daily.        . Coenzyme Q10 200 MG capsule Take one daily      . fluticasone (FLONASE) 50 MCG/ACT nasal spray 2 sprays each nostril at bedtime for 6-8 weeks  16 g  6  . Garlic Oil 1000 MG CAPS Take 1 capsule by mouth daily.        Marland Kitchen guaiFENesin (MUCINEX) 600 MG 12 hr tablet Take 1,200 mg by mouth as needed.        Marland Kitchen MICARDIS HCT 80-25 MG per tablet TAKE 1 TABLET EVERY DAY  30  tablet  6  . mometasone (NASONEX) 50 MCG/ACT nasal spray Place 2 sprays into the nose as needed.  17 g  11  . Multiple Vitamins-Minerals (CENTRUM SILVER) tablet Take 1 tablet by mouth daily.        . Omega-3 Fatty Acids (FISH OIL) 1000 MG CAPS Take 1 capsule by mouth daily.        . potassium chloride SA (KLOR-CON M20) 20 MEQ tablet Take 1 tablet (20 mEq total) by mouth daily.  90 tablet  3  . ranitidine (ZANTAC) 150 MG capsule Take 1 capsule (150 mg total) by mouth as needed.  90 capsule  3  . rosuvastatin (Boyd) 10 MG tablet Take 1 tablet (10 mg total) by mouth daily.  30 tablet  11  . Tamsulosin HCl (FLOMAX) 0.4 MG CAPS Take 1 capsule (0.4 mg total) by mouth as needed.  90 capsule  3   No facility-administered encounter medications on file as of 03/27/2013.    No Known Allergies   Current Medications, Allergies, Past Medical History, Past Surgical History, Family History, and Social History were reviewed in [employer] record.   Review of Systems        See HPI - all other systems neg except as noted...  The patient complains of dyspnea on exertion.  The patient denies anorexia, fever, weight loss, weight gain, vision loss, decreased hearing, hoarseness, chest pain, syncope, peripheral edema, prolonged cough, headaches, hemoptysis, abdominal pain, melena, hematochezia, severe indigestion/heartburn, hematuria, incontinence, muscle weakness, suspicious skin lesions, transient blindness, difficulty walking, depression, unusual weight change, abnormal bleeding, enlarged lymph nodes, and angioedema.     Objective:   Physical Exam     WD, Overweight, 79 y/o BM in NAD... GENERAL:  Alert & oriented; pleasant & cooperative... HEENT:  [place]/AT, EOM-wnl, PERRLA, EACs-clear, TMs-wnl, NOSE- stopped-up/ congested/ nontender, THROAT-clear & wnl. NECK:  Supple w/ fair ROM; no JVD; normal carotid impulses w/o bruits; no thyromegaly or nodules palpated; no  lymphadenopathy. CHEST:  Clear to P & A; without wheezes/ rales/ or rhonchi. HEART:  Regular Rhythm; without murmurs/ rubs/ or gallops. ABDOMEN:  Soft & nontender; normal bowel sounds; no organomegaly or masses detected. EXT: without deformities, mild arthritic changes; no varicose veins/ +venous insuffic/ no edema. NEURO:  CN's intact; motor testing normal; sensory testing normal; gait normal & balance OK. DERM:  No lesions noted; no rash etc...  RADIOLOGY DATA:  Reviewed in the EPIC EMR & discussed w/ the patient...  LABORATORY DATA:  Reviewed in the EPIC EMR & discussed w/ the patient...   Assessment & Plan:   SINUSITIS>> check Xray & Rx w/ Depo, Pred, Augmentin, Mucinex, etc...   HBP>  BP remains well controlled on Norvasc & MicardisHCT; continue same + diet efforts...  AS>  2DEcho 3/12 w/ mild AS; he was eval by DrMcLean who rec good BP &  chol control...  ASPVD>  He has decr ABIs but no claudication; CDopplers are ok as well & he is w/o cerebral ischemic symptoms...  CHOL>  He was intol to Lip80 given by [facility]; he was switched to Cres10 w/ good control of lipids...  DM>  On diet alone w/ A1c=6.8 & we reviewed low carbs & need for wt reduction...  GI>  Boyd & up to date...  GU>  Boyd & PSA's are wnl...  DJD>  Aware, and he manages well w/ OTC meds as needed...   Patient's Medications  New Prescriptions   AMOXICILLIN-CLAVULANATE (AUGMENTIN) 875-125 MG PER TABLET    Take 1 tablet by mouth 2 (two) times daily.   PREDNISONE (DELTASONE) 20 MG TABLET    Take 1 po bid x 3 days, 1 daily x 3 days, 1/2 tab daily x 3 days, 1/2 tablet every other day until gone  Previous Medications   AMLODIPINE (NORVASC) 10 MG TABLET    TAKE 1 TABLET EVERY DAY   ASPIRIN 81 MG TABLET    Take 81 mg by mouth daily.     COENZYME Q10 200 MG CAPSULE    Take one daily   GARLIC OIL 1000 MG CAPS    Take 1 capsule by mouth daily.     GUAIFENESIN (MUCINEX) 600 MG 12 HR TABLET    Take 1,200 mg by mouth  2 (two) times daily. With plenty of fluids   MICARDIS HCT 80-25 MG PER TABLET    TAKE 1 TABLET EVERY DAY   MOMETASONE (NASONEX) 50 MCG/ACT NASAL SPRAY    Place 2 sprays into the nose as needed.   MULTIPLE VITAMINS-MINERALS (CENTRUM SILVER) TABLET    Take 1 tablet by mouth daily.     OMEGA-3 FATTY ACIDS (FISH OIL) 1000 MG CAPS    Take 1 capsule by mouth daily.     POTASSIUM CHLORIDE SA (KLOR-CON M20) 20 MEQ TABLET    Take 1 tablet (20 mEq total) by mouth daily.   RANITIDINE (ZANTAC) 150 MG CAPSULE    Take 1 capsule (150 mg total) by mouth as needed.   ROSUVASTATIN (Boyd) 10 MG TABLET    Take 1 tablet (10 mg total) by mouth daily.   TAMSULOSIN HCL (FLOMAX) 0.4 MG CAPS    Take 1 capsule (0.4 mg total) by mouth as needed.  Modified Medications   Modified Medication Previous Medication   FLUTICASONE (FLONASE) 50 MCG/ACT NASAL SPRAY fluticasone (FLONASE) 50 MCG/ACT nasal spray      2 sprays each nostril at bedtime    2 sprays each nostril at bedtime for 6-8 weeks  Discontinued Medications   No medications on file

## 2013-03-27 NOTE — Patient Instructions (Addendum)
Today we updated your med list in our EPIC system...    Continue your current medications the same...  For your acute sinus symptoms>>    Today we did a sinus XRay to check for air/fluid levels & to see if we need to send you to an ENT specialist...       We will contact you w/ the results when available...     We gave you a Depo shot & a tapering course of Prednisone for the sinus inflammation...       (Take one tab twice daily for 3d, then one daily for 3d, then 1/2 tab daily for 3d, then 1/2 every other day til gone)    We also wrote for an antibiotic- AUGMENTIN 875mg  twice daily for 10d...    Be sure to take a Probiotic as well- ALIGN daily while on the augmentin...  For your chronic sinus symptoms>>    You need to continue the [facility] 600mg - 2 twice daily 7 lots of fluids by mouth...    Start on a regimen of nasal saline- spray your nose every 1-2h during the day & blow it out...    Continue the FLONASE 2 sprays in each nostril at bedtime...  Call for any questions.Marland KitchenMarland Kitchen

## 2013-03-30 ENCOUNTER — Encounter: Payer: Self-pay | Admitting: Pulmonary Disease

## 2013-03-31 ENCOUNTER — Encounter: Payer: Self-pay | Admitting: Cardiology

## 2013-03-31 ENCOUNTER — Ambulatory Visit (INDEPENDENT_AMBULATORY_CARE_PROVIDER_SITE_OTHER): Payer: Medicare Other | Admitting: Cardiology

## 2013-03-31 VITALS — BP 146/66 | HR 76 | Ht 65.0 in | Wt 181.0 lb

## 2013-03-31 DIAGNOSIS — E7800 Pure hypercholesterolemia, unspecified: Secondary | ICD-10-CM

## 2013-03-31 DIAGNOSIS — I739 Peripheral vascular disease, unspecified: Secondary | ICD-10-CM

## 2013-03-31 DIAGNOSIS — I10 Essential (primary) hypertension: Secondary | ICD-10-CM

## 2013-03-31 DIAGNOSIS — I359 Nonrheumatic aortic valve disorder, unspecified: Secondary | ICD-10-CM

## 2013-03-31 DIAGNOSIS — I350 Nonrheumatic aortic (valve) stenosis: Secondary | ICD-10-CM

## 2013-03-31 NOTE — Patient Instructions (Addendum)
Try taking crestor 10mg  every other day.  Call our office and let Dr Shirlee Latch know if you cannot tolerate taking crestor every other day. He can change you to another cholesterol medication called Livalo.   Your physician has requested that you have an echocardiogram. Echocardiography is a painless test that uses sound waves to create images of your heart. It provides your doctor with information about the size and shape of your heart and how well your heart's chambers and valves are working. This procedure takes approximately one hour. There are no restrictions for this procedure.  Your physician wants you to follow-up in: 6 months with Dr Shirlee Latch. Sherrie Mustache 2015). You will receive a reminder letter in the mail two months in advance. If you don't receive a letter, please call our office to schedule the follow-up appointment.

## 2013-04-01 ENCOUNTER — Telehealth: Payer: Self-pay | Admitting: Pulmonary Disease

## 2013-04-01 NOTE — Progress Notes (Signed)
Patient ID: Ronnie Boyd, male   DOB: 09-01-1927, 79 y.o.   MRN: [id number] PCP: Dr. Kriste Basque  79 yo with HTN, diet-controlled diabetes, hyperlipidemia, mild aortic stenosis, and PAD presents for cardiology followup.  Patient symptomatically does quite well.  He denies exertional dyspnea or chest pain.  He is active without significant limitation.  ABIs are mildly decreased but he does not get symptoms consistent with claudication.  Patient's BP has been under control, runs in the 120s-130s systolic at home though it is a bit higher here today.  He is doing better on Crestor than on atorvastatin (less myalgias) but still gets muscle pain if he tries to take Crestor daily.  He is only taking it 1-2 times/week.  Weight is down 2 lbs.  Main complaint recently has been acute sinusitis.  He is on Augmentin and the facial pain is subsiding.     Labs (3/12): K 3.9, creatinine 1.0, TSH normal, LDL 150, HDL 76 Labs (6/12): LFTs normal, LDL 91, HDL 71 Labs (8/12): LDL 62, HDL 75 LBS (10/12): K 3.6, creatinine 1.1 Labs (5/13): LDL 75, HDL 73 Labs (10/13): K 3.7, creatinine 1.1 Labs (5/14): LDL 81, HDL 76, K 3.7, creatinine 1.1  ECG: NSR, anterolateral shallow T wave inversions  PMH: 1. HTN 2. Hyperlipidemia 3. Diabetes mellitus: diet-controlled 4. Allergic rhinitis 5. Myoview (4/05): no ischemia or infarction. 6. PAD: ABIs 2005 with 0.92 on right, 0.78 on left.  Peripheral arterial evaluation (5/12): ABI 0.82 right/0.79 left, TBI 0.69 right/0.54 left (not at risk of tissue loss).  ABIs (12/13) with 0.84 right, 0.97 left (improved).  7. GERD 8. Prostatitis 9. S/p inguinal hernia repair. 10. Aortic stenosis: Mild by echo.  Echo (3/12) with mild LVH, mild to moderate focal basal septal hypertrophy with no LV outflow tract gradient, mild aortic stenosis, normal RV size and systolic function.  11. Carotid dopplers (5/12): 0-39% bilateral ICA stenosis.   SH: Retired, married, never smoked.    FH: Father  with CVA in his 69s, no premature CAD, no AAA  ROS: All systems reviewed and negative except as per HPI.    Current Outpatient Prescriptions  Medication Sig Dispense Refill  . amLODipine (NORVASC) 10 MG tablet TAKE 1 TABLET EVERY DAY  30 tablet  6  . amoxicillin-clavulanate (AUGMENTIN) 875-125 MG per tablet Take 1 tablet by mouth 2 (two) times daily.  20 tablet  0  . aspirin 81 MG tablet Take 81 mg by mouth daily.        . Coenzyme Q10 200 MG capsule Take one daily      . fluticasone (FLONASE) 50 MCG/ACT nasal spray 2 sprays each nostril at bedtime      . Garlic Oil 1000 MG CAPS Take 1 capsule by mouth daily.        Marland Kitchen guaiFENesin (MUCINEX) 600 MG 12 hr tablet Take 1,200 mg by mouth 2 (two) times daily. With plenty of fluids      . MICARDIS HCT 80-25 MG per tablet TAKE 1 TABLET EVERY DAY  30 tablet  6  . mometasone (NASONEX) 50 MCG/ACT nasal spray Place 2 sprays into the nose as needed.  17 g  11  . Multiple Vitamins-Minerals (CENTRUM SILVER) tablet Take 1 tablet by mouth daily.        . Omega-3 Fatty Acids (FISH OIL) 1000 MG CAPS Take 1 capsule by mouth daily.        . potassium chloride SA (KLOR-CON M20) 20 MEQ tablet Take 1 tablet (  20 mEq total) by mouth daily.  90 tablet  3  . predniSONE (DELTASONE) 20 MG tablet Take 1 po bid x 3 days, 1 daily x 3 days, 1/2 tab daily x 3 days, 1/2 tablet every other day until gone  12 tablet  0  . ranitidine (ZANTAC) 150 MG capsule Take 1 capsule (150 mg total) by mouth as needed.  90 capsule  3  . rosuvastatin (CRESTOR) 10 MG tablet 1 tablet every other day      . Tamsulosin HCl (FLOMAX) 0.4 MG CAPS Take 1 capsule (0.4 mg total) by mouth as needed.  90 capsule  3   No current facility-administered medications for this visit.    BP 146/66  Pulse 76  Ht 5\' 5"  (1.651 m)  Wt 82.101 kg (181 lb)  BMI 30.12 kg/m2 General: NAD Neck: No JVD, no thyromegaly or thyroid nodule.  Lungs: Clear to auscultation bilaterally with normal respiratory effort. CV:  Nondisplaced PMI.  Heart regular S1/S2, no S3/S4, 3/6 systolic crescendo-decrescendo murmur RUSB with clear S2.  No peripheral edema.  Soft carotid bruit L>R versus referred murmur from AS.  1+ left PT pulse, difficult to palpate R PT pulse.  Abdomen: Soft, nontender, no hepatosplenomegaly, no distention.  Neurologic: Alert and oriented x 3.  Psych: Normal affect. Extremities: No clubbing or cyanosis.   Assessment/Plan:  Aortic stenosis  Mild aortic stenosis by echo in 3/12.  Echo for progression of AS. HYPERCHOLESTEROLEMIA Less myalgias with Crestor but he only takes it twice a week.  I asked him to try to increase it to every other day.  If he cannot tolerate this, Livalo would be a consideration.  He will need lipids/LFTs in 2 months.    HYPERTENSION  BP is under reasonable control.  PERIPHERAL VASCULAR DISEASE Mildly decreased ABIs seem improved this time compared to prior. He does not have symptoms consistent with claudication. Continue aspirin and statin.   Marca Ancona 04/01/2013 12:00 AM

## 2013-04-01 NOTE — Telephone Encounter (Addendum)
Notes Recorded by Michele Mcalpine, MD on 03/30/2013 at 1:06 AM Please notify patient>  Sinus film w/ hypoplastic frontal sinuses, otherw ok... How's he feeling on RX?  LMTCB to give results

## 2013-04-02 NOTE — Telephone Encounter (Signed)
LMTCB x2  

## 2013-04-03 NOTE — Telephone Encounter (Signed)
Pt advised of results and he states that he is feeling better. Carron Curie, CMA

## 2013-04-07 ENCOUNTER — Ambulatory Visit (HOSPITAL_COMMUNITY): Payer: Medicare Other | Attending: Cardiology

## 2013-04-07 DIAGNOSIS — I739 Peripheral vascular disease, unspecified: Secondary | ICD-10-CM | POA: Insufficient documentation

## 2013-04-07 DIAGNOSIS — E119 Type 2 diabetes mellitus without complications: Secondary | ICD-10-CM | POA: Insufficient documentation

## 2013-04-07 DIAGNOSIS — I359 Nonrheumatic aortic valve disorder, unspecified: Secondary | ICD-10-CM

## 2013-04-07 DIAGNOSIS — I350 Nonrheumatic aortic (valve) stenosis: Secondary | ICD-10-CM

## 2013-04-07 DIAGNOSIS — E785 Hyperlipidemia, unspecified: Secondary | ICD-10-CM | POA: Insufficient documentation

## 2013-04-07 DIAGNOSIS — I10 Essential (primary) hypertension: Secondary | ICD-10-CM | POA: Insufficient documentation

## 2013-04-07 NOTE — Progress Notes (Signed)
Echocardiogram performed.  

## 2013-05-30 ENCOUNTER — Other Ambulatory Visit: Payer: Self-pay | Admitting: Pulmonary Disease

## 2013-06-26 ENCOUNTER — Ambulatory Visit (INDEPENDENT_AMBULATORY_CARE_PROVIDER_SITE_OTHER): Payer: Medicare Other

## 2013-06-26 DIAGNOSIS — Z23 Encounter for immunization: Secondary | ICD-10-CM

## 2013-07-07 ENCOUNTER — Encounter: Payer: Self-pay | Admitting: Pulmonary Disease

## 2013-07-07 ENCOUNTER — Ambulatory Visit (INDEPENDENT_AMBULATORY_CARE_PROVIDER_SITE_OTHER): Payer: Medicare Other | Admitting: Pulmonary Disease

## 2013-07-07 VITALS — BP 140/70 | HR 87 | Temp 98.3°F | Ht 65.0 in | Wt 182.0 lb

## 2013-07-07 DIAGNOSIS — J329 Chronic sinusitis, unspecified: Secondary | ICD-10-CM | POA: Insufficient documentation

## 2013-07-07 DIAGNOSIS — Z87448 Personal history of other diseases of urinary system: Secondary | ICD-10-CM

## 2013-07-07 DIAGNOSIS — E7800 Pure hypercholesterolemia, unspecified: Secondary | ICD-10-CM

## 2013-07-07 DIAGNOSIS — M1990 Unspecified osteoarthritis, unspecified site: Secondary | ICD-10-CM

## 2013-07-07 DIAGNOSIS — K219 Gastro-esophageal reflux disease without esophagitis: Secondary | ICD-10-CM

## 2013-07-07 DIAGNOSIS — I10 Essential (primary) hypertension: Secondary | ICD-10-CM

## 2013-07-07 DIAGNOSIS — I359 Nonrheumatic aortic valve disorder, unspecified: Secondary | ICD-10-CM

## 2013-07-07 DIAGNOSIS — I350 Nonrheumatic aortic (valve) stenosis: Secondary | ICD-10-CM

## 2013-07-07 DIAGNOSIS — R7309 Other abnormal glucose: Secondary | ICD-10-CM

## 2013-07-07 DIAGNOSIS — J309 Allergic rhinitis, unspecified: Secondary | ICD-10-CM

## 2013-07-07 DIAGNOSIS — I739 Peripheral vascular disease, unspecified: Secondary | ICD-10-CM

## 2013-07-07 MED ORDER — AZELASTINE-FLUTICASONE 137-50 MCG/ACT NA SUSP: 1.0000 | Freq: Two times a day (BID) | NASAL | Status: DC

## 2013-07-07 NOTE — Patient Instructions (Signed)
Today we updated your med list in our EPIC system...    Continue your current medications the same...  Try the new DYMISTA nasal spray- one puff in each nostril twice daily...  We will arrange for a thorough sinus eval at "[facility]" at [facility]...  Call for any questions...  Let's plan a follow up visit in 47mo w/ FASTING blood work at that time.Marland KitchenMarland Kitchen

## 2013-07-07 NOTE — Progress Notes (Signed)
Subjective:    Patient ID: Ronnie Boyd, male    DOB: 1927/01/25, 79 y.o.   MRN: [id number]  HPI 79 y/o BM here for a follow up visit... he has mult med problems as noted below...  ~  July 02, 2011:  80mo ROV & he continues to do well, no new complaints or concerns, wants cheaper Micardis & he was offered change to Losartan but he ultimately declined; ok Flu shot today...    HBP>  controlled on Amlodipine10, MicardisHCT80-25, KCl;  BP= 124/72 & similar at home; denies CP, palpit, SOB, etc; he has sys murmur & 2DEcho 3/12 showed mildLVH, norm wall motion w/ EF=65-70%, Gr1DD, calcif AoV leaflets w/ mild AS...    AS> 2DEcho results as noted; he saw DrMcLean 6/12 w/ rec for good BP control & statin Rx...    Periph Vasc dis> on ASA; he has decr ABIs & mild plaque in carotids (see studies below)...    Chol>  DrMcLean tried to incr his Lip40 to Lip80 but he c/o aches & pains, tried CoQ10, then decr back to 40mg /d on his own & feeling better; FLP was sl better on the 80mg  dose but he couldn't tolerate & prefers the 40mg  dose (see below)...    DM>  on diet alone & BS 97, A1c 6.7 which is improved; told he will need medication if he can't get wt down.  ~  January 01, 2012:  47mo ROV & Ronnie Boyd has had an uneventful interval- no new complaints or concerns x he feels that his Lip80 is causing leg cramps & he wants to switch meds; we discussed Rx w/ low dose CREATOR 10mg /d;      He saw DrMcLean 2/13 for Cards f/u> HBP, PAD, mild AS; denies CP, very active & no limitations, not c/o claudication, had to decr Lip40 to Qod due to leg cramps but his FLP was not at goal & he rec incr to 80mg  Qod but INTOL per pt & he wants to switch meds...    He had f/u Urology DrDahlstedt 2/13>  Asymptomatic stable renal cysts, PSA screening (he insists on continuing), LUTS on [facility], ED on Levitra; they tried to switch him to [facility]...    We reviewed prob list, meds, xrays and labs> see below>> LABS 4/13:  Chems- wnl x BS=111  A1c=6.9;  CBC- wnl;  TSH=1.62  ~  July 07, 2012:  47mo ROV & Ronnie Boyd is stable- no new complaints or concerns;  BP controlled on regimen & measures 130/72 today- denies CP, palpit, SOB, edema, we discussed poss switch to Losartan to save $$ & he will let me know;  He is followed by [facility] for Cards- known AS, seen 2/13 doing well, no dizziness or syncope etc;  Chol is well regulated w/ Cres10 & he really needs to lose some weight- we discussed this again today;  DM however is adeq controlled on his low carb diet w/ BS94 & A1c6.8.Marland KitchenMarland Kitchen    We reviewed prob list, meds, xrays and labs> see below for updates >> OK 2013 Flu vaccine today... LABS 10/13:  Chems- wnl w/ BS=94, A1c=6.8 on diet alone...  ~  January 06, 2013:  47mo ROV & Ronnie Boyd notes that DrMcLean increased his Crestor to 20mg /d but he feels he is intol w/ incr leg cramps so he decr himself to 20mg  Qod & improved; We reviewed the following medical problems during today's office visit >>     HBP> controlled on Amlodipine10, MicardisHCT80-25, K20; BP= 142/72 &  similar at home; denies CP, palpit, SOB, etc; he has sys murmur & 2DEcho 3/12 showed mildLVH, norm wall motion w/ EF=65-70%, Gr1DD, calcif AoV leaflets w/ mild AS...    AS> 2DEcho results as noted; he saw [facility] 12/13 w/ rec for good BP control & incr Crestor to 20mg /d...    Periph Vasc dis> on ASA; he has decr ABIs & mild plaque in carotids (see studies below)...    Chol> on Cres20; DrMcLean incr Cres10 to 20 12/13 but pt states it caused incr leg cramps and he decr to 20mg Qod on his own, he wants to ret to 10mg /d...    DM> on diet alone & last labs 10/13 showed BS=64, A1c=6.8    GI- GERD, Hems> on Zantac150 prn; last colon 2004 was neg x hems; denies abd pain, dysphagia, n/v, c/d, blood seen...    Hx of prostatitis, renal cysts> on Flomax.4 & Levitra for ED; he had f/u DrDahlstedt 2/14- pt requests yearly f/u w/ PSAs...    DJD> on OTC analgesics as needed... We reviewed prob list, meds,  xrays and labs> see below for updates >>  ADDENDUM>> LABS done 02/03/13>> FLP- at goals on Cres10;  Chems, CBC, Thyroid> all wnl...  ~  March 27, 2013:  2-32mo ROV & add-on appt for sinusitis> c/o sinus congestion, drainage, sm amt yellow phlegm; mild frontal HA but denies f/c/s; he [facility]ool[facility]20 w/ the same symptoms and dizziness- ears lavaged & given Flonase; requested this visit for persistent symptoms> Sinus XRays w/ hypoplastic frontal sinuses, no AF levels; we decided to treat w/ Depo80, Pred taper, Augmentin + Align, Mucinex 2Bid, Fluids po & Nasal Saline Q1-2h... If not responding we discussed ENT vs [facility]....    We reviewed prob list, meds, xrays and labs> see below for updates >>   ~  July 07, 2013:  22mo ROV & Ronnie Boyd is added-on for complaints of nasal congestion & drainage- on Flonase & Saline, plus Zyrtek & Mucinex; he also notes sore glands and an aching discomfort on the top of his head;  We discussed trial of Dymista, using the Flonase Qhs and referral to [facility]...     BP is controlled on his Amlodipine10, MicardisHCT80-25, K20- BP= 140/70 today & he denies CP, papit, SOB, edema...    He has AS followed by [facility]- last seen 7/14 w/ 2DEcho showing mild AS/AI...    Chol treated w/ diet + Cres10 (he says taking it Qod despite the fact that DrMcLean tried to incr his dose);  Last FLP 5/14 looked good & rec to continue same...    Other medical problems as noted- stable... We reviewed prob list, meds, xrays and labs> see below for updates >>           Problem List:      ALLERGIC RHINITIS (ICD-477.9) - he uses OTC Zyrtek & Mucinex, and knows to avoid pseudophed... ~  7/14: presented w/ sinusitis & XRay showing hypoplastic frontals, otherw neg; Rx w/ Depo, Pred, Augmentin+Align, Mucinex, Saline/Flonase...  HYPERTENSION (ICD-401.9) - on NORVASC 10mg /d, MICARDIS/Hct 80-25 daily, & K20/d...  ~  NuclearStressTest 4/05 was norm- no infarct  or iscemia, EF=72%... ~  CXR 3/10 showed normal heart size, clear lungs, NAD... ~  2DEcho 3/12 showed mildLVH, norm wall motion w/ EF=65-70%, Gr1DD, calcif AoV leaflets w/ mild AS... ~  10/12:  BP= 124/72, feeling well, taking meds regularly, no reported side-effects... home checks are all good per pt- "  even better than here"... denies HA, fatigue, visual changes, CP, palipit, dizziness, syncope, dyspnea, edema, etc... ~  4/13:  BP= 122/62 & he remains largely asymptomatic... ~  10/13:  BP= 130/72 & he remains largely asymptomatic... ~  4/14:  BP= 142/72 and he remains largely asymptomatic...  AORTIC STENOSIS >> he is followed for Cards by DrMcLean... ~  EKG 4/12 showed NSR, NSSTTWA, NAD... ~  2DEcho 3/12 showed mildLVH, norm wall motion w/ EF=65-70%, Gr1DD, calcif AoV leaflets w/ mild AS... ~  EKG 2/13 showed NSR, rate75, infer infarct age undetermined...  PERIPHERAL VASCULAR DISEASE (ICD-443.9) - on ASA 81mg /d... no leg pain or claudic symptoms. ~  ABI's 2005 showed .92R & .78L... ~  ABI's 5/12 showed .82R & .79L (note- toe-brachial indicies are further reduced but not at risk of tissue loss). ~  CDopplers 5/12 showed mild heterog plaque, 0-39% bilat ICA stenoses... ~  ABI's 12/13 showed .84R & .97L (improved from prev); toe pressures are abn but adeq for tissue healing...  HYPERCHOLESTEROLEMIA (ICD-272.0) - prev on LIPITOR 40mg /d + FISH OIL & Garlic pills... ~  FLP 8/08 on Simva20 showed TChol 199, TG 33, HDL 81, LDL 111 ~  FLP 3/09 on Simva20 showed TChol 211, TG 37, HDL 91, LDL 110... great HDL!!! ~  FLP 9/09 on Simva20 showed TChol 190, TG 34, HDL 77, LDL 107 ~  FLP 3/10 on Simva20 showed TChol 238, Tg 35, HDL 76, LDL 138... rec> incr Simva40. ~  FLP 9/10 on Simva40 showed TChol 206, TG 27, HDL 81, LDL 108 ~  FLP 3/11 on Simva40 showed TChol 189, TG 32, HDL 83, LDL 98 ~  FLP 9/11 on Simva40 showed TChol 222, TG 33, HDL 76, LDL 136... not at goal, rec> ch to Lipitor40. ~  FLP 6/12 on  Lip40 showed TChol 167, TG 27, HDL 71, LDL 91... DrMcLean incr to Lip80. ~  FLP 8/12 on Lip80 showed TChol 142, TG 25, HDL 76, LDL 62... But pt INTOL & returned to 40mg /d. ~  Pt started taking the Lip Qod due to leg cramps & not at goal on Lip40Qod; DrMcLean changed to Lip80 Qod but intol. ~  4/13:  We discussed his dilemma & decided to change to CRESTOR 10mg /d regularly=> FLP showed TChol 153, TG 22, HDL 75, LDL 73 ~  12/13:  DrMcLean rechecked FLP on Cres10> TChol 186, TG 44, HDL 74, LDL 103... He rec incr to Cres20... ~  5/14: pt reports that he was intol to Cres20 & changed to 20Qod on his own, he prefers to ret to Cres10/d as before...  DIABETES MELLITUS, BORDERLINE (ICD-790.29) - on diet alone & we discussed diet + exercise program. ~  labs 8/08 showed BS=107, HgA1c=6.4 on diet alone... rec to lose some weight. ~  labs 3/09 (wt=177#)showed BS= 94, HgA1c= 6.6.Marland KitchenMarland Kitchen rec- better diet efforts... ~  labs 9/09 (wt=176#) showed BS= 101, HgA1c= 6.6 ~  labs 3/10 (wt=181#) showed BS= 108, A1c= 6.7 ~  labs 9/10 (wt=181#) showed BS= 107, A1c= 6.6 ~  labs 3/11 (wt=181#) showed BS= 103 ~  labs 9/11 (wt=178##) showed BS= 96, A1c 6.9 ~  Labs 3/12 (wt=182#) showed BS= 96, A1c= 7.1 ~  Labs 10/12 (wt=177#) showed BS= 97, A1c= 6.7 ~  Labs 4/13 (wt=182#) showed BS= 111, A1c= 6.9 ~  Labs 10/13 (wt=182#) showed BS= 94, A1c= 6.8  GERD (ICD-530.81) - he takes Zantac 150mg /d as needed...  HEMORRHOIDS (ICD-455.6) - last colonoscopy was 4/04 by DrPatterson and norm x  for hems...  PROSTATITIS, HX OF (ICD-V13.09) - followed by DrDahlstadt & seen 9/09- note reviewed... he has BPH,  renal cyst, ED... Rx w/ Viagra as needed. ~  labs 3/09 showed PSA= 0.69 ~  labs 3/10 showed PSA= 0.66 ~  labs 3/11 showed PSA= 0.69 ~  Labs 3/12 showed PSA= 0.76 ~  PSA has been followed by Urology, DrDahlstedt.  DEGENERATIVE JOINT DISEASE (ICD-715.90)  HEALTH MAINTENANCE - he gets the yearly seasonal flu vaccines... [facility] (age 59[facility]... had Tetanus shot in ER ~2006...   Past Surgical History  Procedure Laterality Date  . Inguinal hernia repair  1989  . Carpal tunnel release      bilateral    Outpatient Encounter Prescriptions as of 07/07/2013  Medication Sig Dispense Refill  . amLODipine (NORVASC) 10 MG tablet TAKE 1 TABLET BY MOUTH EVERY DAY  30 tablet  6  . aspirin 81 MG tablet Take 81 mg by mouth daily.        . Coenzyme Q10 200 MG capsule Take one daily      . fluticasone (FLONASE) 50 MCG/ACT nasal spray 2 sprays each nostril at bedtime      . Garlic Oil 1000 MG CAPS Take 1 capsule by mouth daily.        Marland Kitchen guaiFENesin (MUCINEX) 600 MG 12 hr tablet Take 1,200 mg by mouth 2 (two) times daily. With plenty of fluids      . MICARDIS HCT 80-25 MG per tablet TAKE 1 TABLET EVERY DAY  30 tablet  6  . mometasone (NASONEX) 50 MCG/ACT nasal spray Place 2 sprays into the nose as needed.  17 g  11  . Multiple Vitamins-Minerals (CENTRUM SILVER) tablet Take 1 tablet by mouth daily.        . Omega-3 Fatty Acids (FISH OIL) 1000 MG CAPS Take 1 capsule by mouth daily.        . potassium chloride SA (KLOR-CON M20) 20 MEQ tablet Take 1 tablet (20 mEq total) by mouth daily.  90 tablet  3  . ranitidine (ZANTAC) 150 MG capsule Take 1 capsule (150 mg total) by mouth as needed.  90 capsule  3  . rosuvastatin (CRESTOR) 10 MG tablet 1 tablet every other day      . Tamsulosin HCl (FLOMAX) 0.4 MG CAPS Take 1 capsule (0.4 mg total) by mouth as needed.  90 capsule  3  . [DISCONTINUED] amoxicillin-clavulanate (AUGMENTIN) 875-125 MG per tablet Take 1 tablet by mouth 2 (two) times daily.  20 tablet  0  . [DISCONTINUED] predniSONE (DELTASONE) 20 MG tablet Take 1 po bid x 3 days, 1 daily x 3 days, 1/2 tab daily x 3 days, 1/2 tablet every other day until gone  12 tablet  0   No facility-administered encounter medications on file as of 07/07/2013.    No Known Allergies   Current Medications, Allergies, Past  Medical History, Past Surgical History, Family History, and Social History were reviewed in [employer] record.   Review of Systems        See HPI - all other systems neg except as noted...  The patient complains of dyspnea on exertion.  The patient denies anorexia, fever, weight loss, weight gain, vision loss, decreased hearing, hoarseness, chest pain, syncope, peripheral edema, prolonged cough, headaches, hemoptysis, abdominal pain, melena, hematochezia, severe indigestion/heartburn, hematuria, incontinence, muscle weakness, suspicious skin lesions, transient blindness, difficulty walking, depression, unusual weight change, abnormal bleeding, enlarged  lymph nodes, and angioedema.     Objective:   Physical Exam     WD, Overweight, 79 y/o BM in NAD... GENERAL:  Alert & oriented; pleasant & cooperative... HEENT:  Saxis/AT, EOM-wnl, PERRLA, EACs-clear, TMs-wnl, NOSE- stopped-up/ congested/ nontender, THROAT-clear & wnl. NECK:  Supple w/ fair ROM; no JVD; normal carotid impulses w/o bruits; no thyromegaly or nodules palpated; no lymphadenopathy. CHEST:  Clear to P & A; without wheezes/ rales/ or rhonchi. HEART:  Regular Rhythm; without murmurs/ rubs/ or gallops. ABDOMEN:  Soft & nontender; normal bowel sounds; no organomegaly or masses detected. EXT: without deformities, mild arthritic changes; no varicose veins/ +venous insuffic/ no edema. NEURO:  CN's intact; motor testing normal; sensory testing normal; gait normal & balance OK. DERM:  No lesions noted; no rash etc...  RADIOLOGY DATA:  Reviewed in the EPIC EMR & discussed w/ the patient...  LABORATORY DATA:  Reviewed in the EPIC EMR & discussed w/ the patient...   Assessment & Plan:   SINUSITIS>> check Xray & Rx w/ Depo, Pred, Augmentin, Mucinex, etc...   HBP>  BP remains well controlled on Norvasc & MicardisHCT; continue same + diet efforts...  AS>  2DEcho 3/12 w/ mild AS; he was eval by DrMcLean who rec good BP  & chol control...  ASPVD>  He has decr ABIs but no claudication; CDopplers are ok as well & he is w/o cerebral ischemic symptoms...  CHOL>  He was intol to Lip80 given by [facility]; he was switched to Cres10 w/ good control of lipids...  DM>  On diet alone w/ A1c=6.8 & we reviewed low carbs & need for wt reduction...  GI>  Stable & up to date...  GU>  Stable & PSA's are wnl...  DJD>  Aware, and he manages well w/ OTC meds as needed.Marland KitchenMarland Kitchen

## 2013-09-18 ENCOUNTER — Other Ambulatory Visit: Payer: Self-pay | Admitting: Pulmonary Disease

## 2013-11-13 ENCOUNTER — Ambulatory Visit (INDEPENDENT_AMBULATORY_CARE_PROVIDER_SITE_OTHER): Payer: Medicare Other | Admitting: Emergency Medicine

## 2013-11-13 VITALS — BP 150/70 | HR 76 | Temp 98.0°F | Resp 16 | Ht 65.0 in | Wt 182.0 lb

## 2013-11-13 DIAGNOSIS — J018 Other acute sinusitis: Secondary | ICD-10-CM

## 2013-11-13 MED ORDER — AMOXICILLIN-POT CLAVULANATE 875-125 MG PO TABS: 1.0000 | ORAL_TABLET | Freq: Two times a day (BID) | ORAL | Status: DC

## 2013-11-13 NOTE — Patient Instructions (Signed)

## 2013-11-13 NOTE — Progress Notes (Signed)
[facility] [address], [place] [id number] [phone]  Date:  11/13/2013   Name:  Ronnie BilletWilliam G Boyd   DOB:  [birth year]   MRN:  [id number]  PCP:  Michele McalpineNADEL,SCOTT M, MD    Chief Complaint: Sinusitis   History of Present Illness:  Ronnie Boyd is a 80 y.o. very pleasant male patient who presents with the following:  History of recent treatment for sinusitis with omnicef.  Has not improved on the medication and requests augmentin.  Has persistent nasal congestion and drainage.  No fever or chills. No cough or sore throat.  No wheezing or shortness of breath.  No nausea or vomiting.  Has frontal headache.  No neuro or visual symptoms.  No improvement with over the counter medications or other home remedies. jd   Patient Active Problem List   Diagnosis Date Noted  . Unspecified sinusitis (chronic) 07/07/2013  . Aortic stenosis 01/08/2011  . DIABETES MELLITUS, BORDERLINE 12/02/2007  . HYPERCHOLESTEROLEMIA 12/01/2007  . HYPERTENSION 12/01/2007  . PERIPHERAL VASCULAR DISEASE 12/01/2007  . HEMORRHOIDS 12/01/2007  . ALLERGIC RHINITIS 12/01/2007  . GERD 12/01/2007  . DEGENERATIVE JOINT DISEASE 12/01/2007  . PROSTATITIS, HX OF 12/01/2007    Past Medical History  Diagnosis Date  . Allergic rhinitis   . Hypertension   . PVD (peripheral vascular disease)   . Hypercholesteremia   . Diabetes mellitus     borderline  . GERD (gastroesophageal reflux disease)   . Hemorrhoids   . Prostatitis     hx of  . Degenerative joint disease     Past Surgical History  Procedure Laterality Date  . Inguinal hernia repair  1989  . Carpal tunnel release      bilateral    History  Substance Use Topics  . Smoking status: Never Smoker   . Smokeless tobacco: Not on file  . Alcohol Use: No    History reviewed. No pertinent family history.  No Known Allergies  Medication list has been reviewed and updated.  Current Outpatient Prescriptions on File Prior to Visit   Medication Sig Dispense Refill  . amLODipine (NORVASC) 10 MG tablet TAKE 1 TABLET BY MOUTH EVERY DAY  30 tablet  6  . aspirin 81 MG tablet Take 81 mg by mouth daily.        . Azelastine-Fluticasone (DYMISTA) 137-50 MCG/ACT SUSP Place 1 spray into the nose 2 (two) times daily.  23 g  6  . Coenzyme Q10 200 MG capsule Take one daily      . Garlic Oil 1000 MG CAPS Take 1 capsule by mouth daily.        Marland Kitchen. guaiFENesin (MUCINEX) 600 MG 12 hr tablet Take 1,200 mg by mouth 2 (two) times daily. With plenty of fluids      . MICARDIS HCT 80-25 MG per tablet TAKE 1 TABLET EVERY DAY  30 tablet  6  . Multiple Vitamins-Minerals (CENTRUM SILVER) tablet Take 1 tablet by mouth daily.        . Omega-3 Fatty Acids (FISH OIL) 1000 MG CAPS Take 1 capsule by mouth daily.        . potassium chloride SA (KLOR-CON M20) 20 MEQ tablet Take 1 tablet (20 mEq total) by mouth daily.  90 tablet  3  . ranitidine (ZANTAC) 150 MG capsule Take 1 capsule (150 mg total) by mouth as needed.  90 capsule  3  . rosuvastatin (CRESTOR) 10 MG tablet 1 tablet every other day      .  Tamsulosin HCl (FLOMAX) 0.4 MG CAPS Take 1 capsule (0.4 mg total) by mouth as needed.  90 capsule  3   No current facility-administered medications on file prior to visit.    Review of Systems:  As per HPI, otherwise negative.    Physical Examination: Filed Vitals:   11/13/13 1228  BP: 150/70  Pulse: 76  Temp: 98 F (36.7 C)  Resp: 16   Filed Vitals:   11/13/13 1228  Height: 5\' 5"  (1.651 m)  Weight: 182 lb (82.555 kg)   Body mass index is 30.29 kg/(m^2). Ideal Body Weight: Weight in (lb) to have BMI = 25: 149.9  GEN: WDWN, NAD, Non-toxic, A & O x 3 HEENT: Atraumatic, Normocephalic. Neck supple. No masses, No LAD. Ears and Nose: No external deformity.  Purulent nasal drainge. CV: RRR, No M/G/R. No JVD. No thrill. No extra heart sounds. PULM: CTA B, no wheezes, crackles, rhonchi. No retractions. No resp. distress. No accessory muscle  use. ABD: S, NT, ND, +BS. No rebound. No HSM. EXTR: No c/c/e NEURO Normal gait.  PSYCH: Normally interactive. Conversant. Not depressed or anxious appearing.  Calm demeanor.    Assessment and Plan: Sinusitis Chronic allergy augmentin  Signed,  Phillips Odor, MD

## 2013-12-22 ENCOUNTER — Encounter: Payer: Self-pay | Admitting: Internal Medicine

## 2013-12-22 ENCOUNTER — Ambulatory Visit (INDEPENDENT_AMBULATORY_CARE_PROVIDER_SITE_OTHER): Payer: Medicare Other | Admitting: Internal Medicine

## 2013-12-22 VITALS — BP 122/70 | HR 88 | Temp 98.5°F | Ht 65.0 in | Wt 181.8 lb

## 2013-12-22 DIAGNOSIS — Z0000 Encounter for general adult medical examination without abnormal findings: Secondary | ICD-10-CM

## 2013-12-22 DIAGNOSIS — R7309 Other abnormal glucose: Secondary | ICD-10-CM

## 2013-12-22 DIAGNOSIS — I359 Nonrheumatic aortic valve disorder, unspecified: Secondary | ICD-10-CM

## 2013-12-22 DIAGNOSIS — E7800 Pure hypercholesterolemia, unspecified: Secondary | ICD-10-CM

## 2013-12-22 DIAGNOSIS — I10 Essential (primary) hypertension: Secondary | ICD-10-CM

## 2013-12-22 DIAGNOSIS — I350 Nonrheumatic aortic (valve) stenosis: Secondary | ICD-10-CM

## 2013-12-22 DIAGNOSIS — Z23 Encounter for immunization: Secondary | ICD-10-CM

## 2013-12-22 NOTE — Progress Notes (Signed)
Pre visit review using our clinic review tool, if applicable. No additional management support is needed unless otherwise documented below in the visit note. 

## 2013-12-22 NOTE — Patient Instructions (Signed)
It was good to see you today.  We have reviewed your prior records including labs and tests today  Test(s) ordered today. Return when you are fasting. Your results will be released to MyChart (or called to you) after review, usually within 72hours after test completion. If any changes need to be made, you will be notified at that same time.  Medications reviewed and updated, no changes recommended at this time. Refill on medication(s) as discussed today.  we'll make referral to   . Our office will contact you regarding appointment(s) once made.  Please schedule followup in 3-4 months, call sooner if problems.

## 2013-12-23 ENCOUNTER — Telehealth: Payer: Self-pay | Admitting: Internal Medicine

## 2013-12-23 ENCOUNTER — Encounter: Payer: Self-pay | Admitting: Internal Medicine

## 2013-12-23 NOTE — Assessment & Plan Note (Signed)
Several prior statin trials cause intolerable myalgias (simva, atorva, now Ronnie Boyd) Reviewed goal LDL<70 given PAD, but denies claudication symptoms Takes crestor intermittently - advised "some is better than none" Check lipids annually encouraged attention to diet and exercise mgmt of same given poor tol of meds

## 2013-12-23 NOTE — Assessment & Plan Note (Signed)
BP Readings from Last 3 Encounters:  12/22/13 122/70  11/13/13 150/70  07/07/13 140/70   The current medical regimen is effective;  continue present plan and medications.

## 2013-12-23 NOTE — Assessment & Plan Note (Signed)
The patient is asked to make an attempt to improve diet and exercise patterns to aid in medical management of this problem. Lab Results  Component Value Date   HGBA1C 6.8* 07/07/2012

## 2013-12-23 NOTE — Telephone Encounter (Signed)
Relevant patient education assigned to patient using Emmi. ° °

## 2013-12-23 NOTE — Assessment & Plan Note (Addendum)
Reviewed last echo 03/2013 - stable, mild AS asymptomatic  Follows with cards, annually and prn

## 2013-12-23 NOTE — Progress Notes (Signed)
Subjective:    Patient ID: Ronnie Boyd, male    DOB: 1927-06-28, 80 y.o.   MRN: [id number]  HPI  Patient here to establish with new PCP - transfer from [facility] due to retirement  Here for annual wellness  Diet: heart healthy, low carb Physical activity: sedentary Depression/mood screen: negative Hearing: intact to whispered voice Visual acuity: grossly normal, performs annual eye exam  ADLs: capable Fall risk: none Home safety: good Cognitive evaluation: intact to orientation, naming, recall and repetition EOL planning: adv directives, full code/ I agree  I have personally reviewed and have noted 1. The patient's medical and social history 2. Their use of alcohol, tobacco or illicit drugs 3. Their current medications and supplements 4. The patient's functional ability including ADL's, fall risks, home safety risks and hearing or visual impairment. 5. Diet and physical activities 6. Evidence for depression or mood disorders  Also reviewed chronic medical issues and interval medical events: HTN, AS, hyperglycemia, dyslipidemia and OA  Past Medical History  Diagnosis Date  . Allergic rhinitis   . Hypertension   . PVD (peripheral vascular disease)   . Hypercholesteremia   . Diabetes mellitus     borderline  . GERD (gastroesophageal reflux disease)   . Hemorrhoids   . Prostatitis     hx of  . Degenerative joint disease   . Aortic stenosis     mild echo 7/14   Family History  Problem Relation Age of Onset  . Arthritis     History  Substance Use Topics  . Smoking status: Never Smoker   . Smokeless tobacco: Not on file  . Alcohol Use: No    Review of Systems  Constitutional: Negative for fever, activity change, appetite change, fatigue and unexpected weight change.  Respiratory: Negative for cough, chest tightness, shortness of breath and wheezing.   Cardiovascular: Negative for chest pain, palpitations and leg swelling.  Genitourinary: Negative for urgency  and decreased urine volume (follows with uro for same).  Musculoskeletal: Positive for arthralgias and myalgias (if on statin regularly). Negative for gait problem and joint swelling.  Neurological: Negative for dizziness, weakness and headaches.  Psychiatric/Behavioral: Negative for sleep disturbance and dysphoric mood. The patient is not nervous/anxious.   All other systems reviewed and are negative.      Objective:   Physical Exam  BP 122/70  Pulse 88  Temp(Src) 98.5 F (36.9 C) (Oral)  Ht 5\' 5"  (1.651 m)  Wt 181 lb 12.8 oz (82.464 kg)  BMI 30.25 kg/m2  SpO2 96% Wt Readings from Last 3 Encounters:  12/22/13 181 lb 12.8 oz (82.464 kg)  11/13/13 182 lb (82.555 kg)  07/07/13 182 lb (82.555 kg)   Constitutional: he is overweight, but appears well-developed and well-nourished. No distress.  Neck: Normal range of motion. Neck supple. No JVD present. No thyromegaly present.  Cardiovascular: Normal rate, regular rhythm and normal heart sounds.  3/6 systolic murmur heard. No BLE edema. Pulmonary/Chest: Effort normal and breath sounds normal. No respiratory distress. he has no wheezes.  Psychiatric: he has a normal, very pleasant mood and affect. His behavior is normal. Judgment and thought content normal.   Lab Results  Component Value Date   WBC 5.8 02/03/2013   HGB 14.1 02/03/2013   HCT 43.5 02/03/2013   PLT 164.0 02/03/2013   GLUCOSE 84 02/03/2013   CHOL 165 02/03/2013   TRIG 36.0 02/03/2013   HDL 76.40 02/03/2013   LDLDIRECT 131.5 11/06/2011   LDLCALC 81 02/03/2013  ALT 19 02/03/2013   AST 26 02/03/2013   NA 138 02/03/2013   K 3.7 02/03/2013   CL 105 02/03/2013   CREATININE 1.1 02/03/2013   BUN 14 02/03/2013   CO2 26 02/03/2013   TSH 1.41 02/03/2013   PSA 0.76 11/23/2010   HGBA1C 6.8* 07/07/2012    Dg Sinuses Complete  03/27/2013   *RADIOLOGY REPORT*  Clinical Data: Congestion and drainage for the past 3 weeks.  PARANASAL SINUSES - COMPLETE 3 + VIEW  Comparison: No priors.   Findings: Frontal sinuses are hypoplastic.  They do not appear well pneumatized.  No air fluid levels are identified within the paranasal sinuses at this time, however, the possibility of complete opacification of the frontal sinuses and is not excluded based on today's radiograph. The other paranasal sinuses appear well pneumatized.  IMPRESSION: 1.  Hypoplastic frontal sinuses which appear opacified.  This may be related to chronic mucosal thickening, or complete opacification with fluid.  Further evaluation with sinus CT would provide additional diagnostic information if clinically indicated.   Original Report Authenticated By: Trudie Reedaniel Entrikin, M.D.       Assessment & Plan:   AWV/v70.0 - Today patient counseled on age appropriate routine health concerns for screening and prevention, each reviewed and up to date or declined. Immunizations reviewed and up to date or declined. Labs/ECG reviewed. Risk factors for depression reviewed and negative. Hearing function and visual acuity are intact. ADLs screened and addressed as needed. Functional ability and level of safety reviewed and appropriate. Education, counseling and referrals performed based on assessed risks today. Patient provided with a copy of personalized plan for preventive services.  Problem List Items Addressed This Visit   Aortic stenosis     Reviewed last echo 03/2013 - stable, mild AS asymptomatic  Follows with cards, annually and prn    Relevant Orders      Lipid panel   DIABETES MELLITUS, BORDERLINE      The patient is asked to make an attempt to improve diet and exercise patterns to aid in medical management of this problem. Lab Results  Component Value Date   HGBA1C 6.8* 07/07/2012       HYPERCHOLESTEROLEMIA - Primary     Several prior statin trials cause intolerable myalgias (simva, atorva, now rouva) Reviewed goal LDL<70 given PAD, but denies claudication symptoms Takes crestor intermittently - advised "some is better  than none" Check lipids annually encouraged attention to diet and exercise mgmt of same given poor tol of meds    Relevant Orders      Lipid panel   HYPERTENSION      BP Readings from Last 3 Encounters:  12/22/13 122/70  11/13/13 150/70  07/07/13 140/70   The current medical regimen is effective;  continue present plan and medications.     Relevant Orders      Lipid panel    Other Visit Diagnoses   Need for prophylactic vaccination against Streptococcus pneumoniae (pneumococcus)        Relevant Orders       Pneumococcal polysaccharide vaccine 23-valent greater than or equal to 2yo subcutaneous/IM (Completed)

## 2013-12-25 ENCOUNTER — Other Ambulatory Visit (INDEPENDENT_AMBULATORY_CARE_PROVIDER_SITE_OTHER): Payer: Medicare Other

## 2013-12-25 ENCOUNTER — Telehealth: Payer: Self-pay | Admitting: *Deleted

## 2013-12-25 DIAGNOSIS — E7800 Pure hypercholesterolemia, unspecified: Secondary | ICD-10-CM

## 2013-12-25 DIAGNOSIS — I359 Nonrheumatic aortic valve disorder, unspecified: Secondary | ICD-10-CM

## 2013-12-25 DIAGNOSIS — I10 Essential (primary) hypertension: Secondary | ICD-10-CM

## 2013-12-25 DIAGNOSIS — I350 Nonrheumatic aortic (valve) stenosis: Secondary | ICD-10-CM

## 2013-12-25 LAB — LIPID PANEL
CHOLESTEROL: 220 mg/dL — AB (ref 0–200)
HDL: 78.6 mg/dL (ref >39.00)
LDL Cholesterol: 136 mg/dL — ABNORMAL HIGH (ref 0–99)
Total CHOL/HDL Ratio: 3
Triglycerides: 27 mg/dL (ref 0.0–149.0)
VLDL: 5.4 mg/dL (ref 0.0–40.0)

## 2013-12-25 NOTE — Telephone Encounter (Signed)
Called pt concerning his cholesterol results. Gave md response. Pt states he started back on the crestor and the body aching /cramps has started. Was suggested to take qod, but is there any other alternative he can take. He states he can't take lipitor because med is worst.../lmb

## 2013-12-28 MED ORDER — PRAVASTATIN SODIUM 20 MG PO TABS: 20.0000 mg | ORAL_TABLET | Freq: Every day | ORAL | Status: DC

## 2013-12-28 NOTE — Telephone Encounter (Signed)
Ok to change crestor to prava 20mg  qd - if pt agrees, send rx No other changes recommended

## 2013-12-28 NOTE — Telephone Encounter (Signed)
Notified pt with md response. Agreed to take pravastatin sent [facility].../lmb

## 2014-01-05 ENCOUNTER — Ambulatory Visit: Payer: Medicare Other | Admitting: Pulmonary Disease

## 2014-02-16 ENCOUNTER — Other Ambulatory Visit: Payer: Self-pay | Admitting: Internal Medicine

## 2014-06-10 ENCOUNTER — Other Ambulatory Visit: Payer: Self-pay | Admitting: Pulmonary Disease

## 2014-06-22 ENCOUNTER — Other Ambulatory Visit (INDEPENDENT_AMBULATORY_CARE_PROVIDER_SITE_OTHER): Payer: Medicare Other

## 2014-06-22 ENCOUNTER — Ambulatory Visit (INDEPENDENT_AMBULATORY_CARE_PROVIDER_SITE_OTHER): Payer: Medicare Other | Admitting: Internal Medicine

## 2014-06-22 ENCOUNTER — Encounter: Payer: Self-pay | Admitting: Internal Medicine

## 2014-06-22 VITALS — BP 128/76 | HR 96 | Temp 97.7°F | Ht 65.0 in | Wt 177.8 lb

## 2014-06-22 DIAGNOSIS — E7800 Pure hypercholesterolemia, unspecified: Secondary | ICD-10-CM

## 2014-06-22 DIAGNOSIS — R739 Hyperglycemia, unspecified: Secondary | ICD-10-CM

## 2014-06-22 DIAGNOSIS — I10 Essential (primary) hypertension: Secondary | ICD-10-CM

## 2014-06-22 DIAGNOSIS — Z23 Encounter for immunization: Secondary | ICD-10-CM

## 2014-06-22 LAB — BASIC METABOLIC PANEL
BUN: 14 mg/dL (ref 6–23)
CALCIUM: 9.3 mg/dL (ref 8.4–10.5)
CO2: 27 mEq/L (ref 19–32)
CREATININE: 1.2 mg/dL (ref 0.4–1.5)
Chloride: 102 mEq/L (ref 96–112)
GFR: 74.74 mL/min (ref >60.00)
Glucose, Bld: 102 mg/dL — ABNORMAL HIGH (ref 70–99)
Potassium: 4 mEq/L (ref 3.5–5.1)
SODIUM: 136 meq/L (ref 135–145)

## 2014-06-22 LAB — HEMOGLOBIN A1C: HEMOGLOBIN A1C: 6.7 % — AB (ref 4.6–6.5)

## 2014-06-22 MED ORDER — AMLODIPINE BESYLATE 10 MG PO TABS: 10.0000 mg | ORAL_TABLET | Freq: Every day | ORAL | Status: DC

## 2014-06-22 MED ORDER — TELMISARTAN-HCTZ 80-25 MG PO TABS: 1.0000 | ORAL_TABLET | Freq: Every day | ORAL | Status: DC

## 2014-06-22 MED ORDER — AZELASTINE-FLUTICASONE 137-50 MCG/ACT NA SUSP: 1.0000 | Freq: Two times a day (BID) | NASAL | Status: DC

## 2014-06-22 NOTE — Assessment & Plan Note (Signed)
BP Readings from Last 3 Encounters:  06/22/14 128/76  12/22/13 122/70  11/13/13 150/70   The current medical regimen is effective;  continue present plan and medications.

## 2014-06-22 NOTE — Progress Notes (Signed)
Subjective:    Patient ID: Ronnie BilletWilliam G Boyd, male    DOB: 03-21-1927, 80 y.o.   MRN: [id number]  HPI  Patient here for follow up Reviewed chronic medical issues and interval medical events  Past Medical History  Diagnosis Date  . Allergic rhinitis   . Hypertension   . PVD (peripheral vascular disease)   . Hypercholesteremia   . Diabetes mellitus     borderline  . GERD (gastroesophageal reflux disease)   . Hemorrhoids   . Prostatitis     hx of  . Degenerative joint disease   . Aortic stenosis     mild echo 7/14    Review of Systems  Constitutional: Negative for fever, fatigue and unexpected weight change.  Respiratory: Negative for cough and shortness of breath.   Cardiovascular: Negative for chest pain, palpitations and leg swelling.       Objective:   Physical Exam  BP 128/76  Pulse 96  Temp(Src) 97.7 F (36.5 C) (Oral)  Ht 5\' 5"  (1.651 m)  Wt 177 lb 12 oz (80.627 kg)  BMI 29.58 kg/m2  SpO2 97% Wt Readings from Last 3 Encounters:  06/22/14 177 lb 12 oz (80.627 kg)  12/22/13 181 lb 12.8 oz (82.464 kg)  11/13/13 182 lb (82.555 kg)   Constitutional: he appears well-developed and well-nourished. No distress.  Neck: Normal range of motion. Neck supple. No JVD present. No thyromegaly present.  Cardiovascular: Normal rate, regular rhythm and normal heart sounds.  No murmur heard. No BLE edema. Pulmonary/Chest: Effort normal and breath sounds normal. No respiratory distress. he has no wheezes.  Psychiatric: he has a normal mood and affect. His behavior is normal. Judgment and thought content normal.   Lab Results  Component Value Date   WBC 5.8 02/03/2013   HGB 14.1 02/03/2013   HCT 43.5 02/03/2013   PLT 164.0 02/03/2013   GLUCOSE 84 02/03/2013   CHOL 220* 12/25/2013   TRIG 27.0 12/25/2013   HDL 78.60 12/25/2013   LDLDIRECT 131.5 11/06/2011   LDLCALC 136* 12/25/2013   ALT 19 02/03/2013   AST 26 02/03/2013   NA 138 02/03/2013   K 3.7 02/03/2013   CL 105 02/03/2013   CREATININE 1.1 02/03/2013   BUN 14 02/03/2013   CO2 26 02/03/2013   TSH 1.41 02/03/2013   PSA 0.76 11/23/2010   HGBA1C 6.8* 07/07/2012    Dg Sinuses Complete  03/27/2013   *RADIOLOGY REPORT*  Clinical Data: Congestion and drainage for the past 3 weeks.  PARANASAL SINUSES - COMPLETE 3 + VIEW  Comparison: No priors.  Findings: Frontal sinuses are hypoplastic.  They do not appear well pneumatized.  No air fluid levels are identified within the paranasal sinuses at this time, however, the possibility of complete opacification of the frontal sinuses and is not excluded based on today's radiograph. The other paranasal sinuses appear well pneumatized.  IMPRESSION: 1.  Hypoplastic frontal sinuses which appear opacified.  This may be related to chronic mucosal thickening, or complete opacification with fluid.  Further evaluation with sinus CT would provide additional diagnostic information if clinically indicated.   Original Report Authenticated By: Trudie Reedaniel Entrikin, M.D.       Assessment & Plan:   Problem List Items Addressed This Visit   Essential hypertension      BP Readings from Last 3 Encounters:  06/22/14 128/76  12/22/13 122/70  11/13/13 150/70   The current medical regimen is effective;  continue present plan and medications.     Relevant  Medications      amLODIpine (NORVASC) tablet      telmisartan-hydrochlorothiazide (MICARDIS HCT) 80-25 MG per tablet   Other Relevant Orders      Basic metabolic panel   HYPERCHOLESTEROLEMIA - Primary     Several prior statin trials cause intolerable myalgias (simva, atorva, rouva) Reviewed goal LDL<70 given PAD, but denies claudication symptoms Takes prava intermittently - has been advised "some is better than none" Check lipids annually encouraged attention to diet and exercise mgmt of same given poor tol of med    Relevant Medications      amLODIpine (NORVASC) tablet      telmisartan-hydrochlorothiazide (MICARDIS HCT) 80-25 MG per tablet    Hyperglycemia      The patient is asked to make an attempt to improve diet and exercise patterns to aid in medical management of this problem. Check a1c and consider meds as needed Lab Results  Component Value Date   HGBA1C 6.8* 07/07/2012      Relevant Orders      Hemoglobin A1c    Other Visit Diagnoses   Need for prophylactic vaccination and inoculation against influenza        Relevant Orders       Flu vaccine HIGH DOSE PF (Fluzone Tri High dose) (Completed)

## 2014-06-22 NOTE — Assessment & Plan Note (Signed)
The patient is asked to make an attempt to improve diet and exercise patterns to aid in medical management of this problem. Check a1c and consider meds as needed Lab Results  Component Value Date   HGBA1C 6.8* 07/07/2012

## 2014-06-22 NOTE — Progress Notes (Signed)
Pre visit review using our clinic review tool, if applicable. No additional management support is needed unless otherwise documented below in the visit note. 

## 2014-06-22 NOTE — Assessment & Plan Note (Signed)
Several prior statin trials cause intolerable myalgias (simva, atorva, rouva) Reviewed goal LDL<70 given PAD, but denies claudication symptoms Takes prava intermittently - has been advised "some is better than none" Check lipids annually encouraged attention to diet and exercise mgmt of same given poor tol of med

## 2014-06-22 NOTE — Patient Instructions (Addendum)
It was good to see you today.  We have reviewed your prior records including labs and tests today  Your annual flu shot was given and/or updated today.  Test(s) ordered today. Your results will be released to MyChart (or called to you) after review, usually within 72hours after test completion. If any changes need to be made, you will be notified at that same time.  Medications reviewed and updated, no changes recommended at this time. Refill on medication(s) as discussed today.  Please schedule followup in 6-12 months, call sooner if problems.  DASH Eating Plan DASH stands for "Dietary Approaches to Stop Hypertension." The DASH eating plan is a healthy eating plan that has been shown to reduce high blood pressure (hypertension). Additional health benefits may include reducing the risk of type 2 diabetes mellitus, heart disease, and stroke. The DASH eating plan may also help with weight loss. WHAT DO I NEED TO KNOW ABOUT THE DASH EATING PLAN? For the DASH eating plan, you will follow these general guidelines:  Choose foods with a percent daily value for sodium of less than 5% (as listed on the food label).  Use salt-free seasonings or herbs instead of table salt or sea salt.  Check with your health care provider or pharmacist before using salt substitutes.  Eat lower-sodium products, often labeled as "lower sodium" or "no salt added."  Eat fresh foods.  Eat more vegetables, fruits, and low-fat dairy products.  Choose whole grains. Look for the word "whole" as the first word in the ingredient list.  Choose fish and skinless chicken or [place] more often than red meat. Limit fish, poultry, and meat to 6 oz (170 g) each day.  Limit sweets, desserts, sugars, and sugary drinks.  Choose heart-healthy fats.  Limit cheese to 1 oz (28 g) per day.  Eat more home-cooked food and less restaurant, buffet, and fast food.  Limit fried foods.  Cook foods using methods other than  frying.  Limit canned vegetables. If you do use them, rinse them well to decrease the sodium.  When eating at a restaurant, ask that your food be prepared with less salt, or no salt if possible. WHAT FOODS CAN I EAT? Seek help from a dietitian for individual calorie needs. Grains Whole grain or whole wheat bread. Brown rice. Whole grain or whole wheat pasta. Quinoa, bulgur, and whole grain cereals. Low-sodium cereals. Corn or whole wheat flour tortillas. Whole grain cornbread. Whole grain crackers. Low-sodium crackers. Vegetables Fresh or frozen vegetables (raw, steamed, roasted, or grilled). Low-sodium or reduced-sodium tomato and vegetable juices. Low-sodium or reduced-sodium tomato sauce and paste. Low-sodium or reduced-sodium canned vegetables.  Fruits All fresh, canned (in natural juice), or frozen fruits. Meat and Other Protein Products Ground beef (85% or leaner), grass-fed beef, or beef trimmed of fat. Skinless chicken or [place]key. Ground chicken or [place]key. Pork trimmed of fat. All fish and seafood. Eggs. Dried beans, peas, or lentils. Unsalted nuts and seeds. Unsalted canned beans. Dairy Low-fat dairy products, such as skim or 1% milk, 2% or reduced-fat cheeses, low-fat ricotta or cottage cheese, or plain low-fat yogurt. Low-sodium or reduced-sodium cheeses. Fats and Oils Tub margarines without trans fats. Light or reduced-fat mayonnaise and salad dressings (reduced sodium). Avocado. Safflower, olive, or canola oils. Natural peanut or almond butter. Other Unsalted popcorn and pretzels. The items listed above may not be a complete list of recommended foods or beverages. Contact your dietitian for more options. WHAT FOODS ARE NOT RECOMMENDED? Grains White bread. White pasta.  White rice. Refined cornbread. Bagels and croissants. Crackers that contain trans fat. Vegetables Creamed or fried vegetables. Vegetables in a cheese sauce. Regular canned vegetables. Regular canned tomato sauce  and paste. Regular tomato and vegetable juices. Fruits Dried fruits. Canned fruit in light or heavy syrup. Fruit juice. Meat and Other Protein Products Fatty cuts of meat. Ribs, chicken wings, bacon, sausage, bologna, salami, chitterlings, fatback, hot dogs, bratwurst, and packaged luncheon meats. Salted nuts and seeds. Canned beans with salt. Dairy Whole or 2% milk, cream, half-and-half, and cream cheese. Whole-fat or sweetened yogurt. Full-fat cheeses or blue cheese. Nondairy creamers and whipped toppings. Processed cheese, cheese spreads, or cheese curds. Condiments Onion and garlic salt, seasoned salt, table salt, and sea salt. Canned and packaged gravies. Worcestershire sauce. Tartar sauce. Barbecue sauce. Teriyaki sauce. Soy sauce, including reduced sodium. Steak sauce. Fish sauce. Oyster sauce. Cocktail sauce. Horseradish. Ketchup and mustard. Meat flavorings and tenderizers. Bouillon cubes. Hot sauce. Tabasco sauce. Marinades. Taco seasonings. Relishes. Fats and Oils Butter, stick margarine, lard, shortening, ghee, and bacon fat. Coconut, palm kernel, or palm oils. Regular salad dressings. Other Pickles and olives. Salted popcorn and pretzels. The items listed above may not be a complete list of foods and beverages to avoid. Contact your dietitian for more information. WHERE CAN I FIND MORE INFORMATION? National Heart, Lung, and Blood Institute: [employer]Prom[employer].itwww.nhlbi.nih.gov/health/health-topics/topics/dash/ Document Released: 08/16/2011 Document Revised: 01/11/2014 Document Reviewed: 07/01/2013 [facility] Patient Information 2015 [employer] [place]. This information is not intended to replace advice given to you by your health care provider. Make sure you discuss any questions you have with your health care provider.

## 2014-07-28 ENCOUNTER — Other Ambulatory Visit: Payer: Self-pay | Admitting: Pulmonary Disease

## 2014-07-29 ENCOUNTER — Other Ambulatory Visit: Payer: Self-pay | Admitting: Internal Medicine

## 2014-09-09 ENCOUNTER — Other Ambulatory Visit: Payer: Self-pay | Admitting: Pulmonary Disease

## 2014-09-10 ENCOUNTER — Other Ambulatory Visit: Payer: Self-pay | Admitting: Internal Medicine

## 2014-12-12 ENCOUNTER — Encounter (HOSPITAL_COMMUNITY): Payer: Self-pay | Admitting: Emergency Medicine

## 2014-12-12 ENCOUNTER — Emergency Department (HOSPITAL_COMMUNITY): Payer: Medicare Other

## 2014-12-12 ENCOUNTER — Emergency Department (HOSPITAL_COMMUNITY)
Admission: EM | Admit: 2014-12-12 | Discharge: 2014-12-13 | Disposition: A | Discharge status: Home / Self Care | Payer: Medicare Other | Attending: Emergency Medicine | Admitting: Emergency Medicine

## 2014-12-12 DIAGNOSIS — Z79899 Other long term (current) drug therapy: Secondary | ICD-10-CM | POA: Diagnosis not present

## 2014-12-12 DIAGNOSIS — M1990 Unspecified osteoarthritis, unspecified site: Secondary | ICD-10-CM | POA: Diagnosis not present

## 2014-12-12 DIAGNOSIS — Z23 Encounter for immunization: Secondary | ICD-10-CM | POA: Diagnosis not present

## 2014-12-12 DIAGNOSIS — I10 Essential (primary) hypertension: Secondary | ICD-10-CM | POA: Insufficient documentation

## 2014-12-12 DIAGNOSIS — K219 Gastro-esophageal reflux disease without esophagitis: Secondary | ICD-10-CM | POA: Diagnosis not present

## 2014-12-12 DIAGNOSIS — Y998 Other external cause status: Secondary | ICD-10-CM | POA: Diagnosis not present

## 2014-12-12 DIAGNOSIS — S86812A Strain of other muscle(s) and tendon(s) at lower leg level, left leg, initial encounter: Secondary | ICD-10-CM | POA: Insufficient documentation

## 2014-12-12 DIAGNOSIS — S8992XA Unspecified injury of left lower leg, initial encounter: Secondary | ICD-10-CM | POA: Diagnosis present

## 2014-12-12 DIAGNOSIS — Z7982 Long term (current) use of aspirin: Secondary | ICD-10-CM | POA: Insufficient documentation

## 2014-12-12 DIAGNOSIS — Z87438 Personal history of other diseases of male genital organs: Secondary | ICD-10-CM | POA: Diagnosis not present

## 2014-12-12 DIAGNOSIS — E785 Hyperlipidemia, unspecified: Secondary | ICD-10-CM | POA: Insufficient documentation

## 2014-12-12 DIAGNOSIS — Y92410 Unspecified street and highway as the place of occurrence of the external cause: Secondary | ICD-10-CM | POA: Diagnosis not present

## 2014-12-12 DIAGNOSIS — Y9389 Activity, other specified: Secondary | ICD-10-CM | POA: Diagnosis not present

## 2014-12-12 MED ORDER — TETANUS-DIPHTH-ACELL PERTUSSIS 5-2.5-18.5 LF-MCG/0.5 IM SUSP
0.5000 mL | Freq: Once | INTRAMUSCULAR | Status: AC
  Administered 2014-12-12: 0.5 mL via INTRAMUSCULAR
  Filled 2014-12-12: qty 0.5

## 2014-12-12 NOTE — ED Notes (Signed)
Manual BP noted to be 204/108.
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
## 2014-12-13 MED ORDER — HYDROCODONE-ACETAMINOPHEN 5-325 MG PO TABS: 1.0000 | ORAL_TABLET | Freq: Four times a day (QID) | ORAL | Status: DC | PRN
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
## 2014-12-15 ENCOUNTER — Other Ambulatory Visit: Payer: Self-pay | Admitting: Pulmonary Disease

## 2014-12-15 ENCOUNTER — Other Ambulatory Visit: Payer: Self-pay | Admitting: Internal Medicine

## 2014-12-23 ENCOUNTER — Encounter: Payer: Self-pay | Admitting: Internal Medicine

## 2014-12-23 ENCOUNTER — Other Ambulatory Visit (INDEPENDENT_AMBULATORY_CARE_PROVIDER_SITE_OTHER): Payer: Medicare Other

## 2014-12-23 ENCOUNTER — Ambulatory Visit (INDEPENDENT_AMBULATORY_CARE_PROVIDER_SITE_OTHER): Payer: Medicare Other | Admitting: Internal Medicine

## 2014-12-23 VITALS — BP 142/80 | HR 81 | Temp 97.7°F | Ht 65.0 in | Wt 181.0 lb

## 2014-12-23 DIAGNOSIS — Z0000 Encounter for general adult medical examination without abnormal findings: Secondary | ICD-10-CM | POA: Diagnosis not present

## 2014-12-23 DIAGNOSIS — E7800 Pure hypercholesterolemia, unspecified: Secondary | ICD-10-CM

## 2014-12-23 DIAGNOSIS — I10 Essential (primary) hypertension: Secondary | ICD-10-CM | POA: Diagnosis not present

## 2014-12-23 DIAGNOSIS — R739 Hyperglycemia, unspecified: Secondary | ICD-10-CM

## 2014-12-23 DIAGNOSIS — Z23 Encounter for immunization: Secondary | ICD-10-CM

## 2014-12-23 LAB — BASIC METABOLIC PANEL
BUN: 16 mg/dL (ref 6–23)
CALCIUM: 9.4 mg/dL (ref 8.4–10.5)
CO2: 29 mEq/L (ref 19–32)
Chloride: 101 mEq/L (ref 96–112)
Creatinine, Ser: 1.19 mg/dL (ref 0.40–1.50)
GFR: 74.65 mL/min (ref >60.00)
GLUCOSE: 86 mg/dL (ref 70–99)
Potassium: 4 mEq/L (ref 3.5–5.1)
Sodium: 135 mEq/L (ref 135–145)

## 2014-12-23 LAB — LIPID PANEL
Cholesterol: 240 mg/dL — ABNORMAL HIGH (ref 0–200)
HDL: 78 mg/dL (ref >39.00)
LDL Cholesterol: 152 mg/dL — ABNORMAL HIGH (ref 0–99)
NonHDL: 162
TRIGLYCERIDES: 51 mg/dL (ref 0.0–149.0)
Total CHOL/HDL Ratio: 3
VLDL: 10.2 mg/dL (ref 0.0–40.0)

## 2014-12-23 LAB — HEMOGLOBIN A1C: Hgb A1c MFr Bld: 6.5 % (ref 4.6–6.5)

## 2014-12-23 MED ORDER — PRAVASTATIN SODIUM 20 MG PO TABS: 20.0000 mg | ORAL_TABLET | Freq: Every day | ORAL | Status: DC

## 2014-12-23 MED ORDER — TELMISARTAN-HCTZ 80-25 MG PO TABS: 1.0000 | ORAL_TABLET | Freq: Every day | ORAL | Status: DC

## 2014-12-23 MED ORDER — POTASSIUM CHLORIDE CRYS ER 20 MEQ PO TBCR: 20.0000 meq | EXTENDED_RELEASE_TABLET | Freq: Every day | ORAL | Status: DC

## 2014-12-23 MED ORDER — AMLODIPINE BESYLATE 10 MG PO TABS: 10.0000 mg | ORAL_TABLET | Freq: Every day | ORAL | Status: DC

## 2014-12-23 NOTE — Assessment & Plan Note (Signed)
BP Readings from Last 3 Encounters:  12/23/14 142/80  12/13/14 206/102  06/22/14 128/76   The current medical regimen is effective;  continue present plan and medications.
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
## 2014-12-23 NOTE — Progress Notes (Signed)
Pre visit review using our clinic review tool, if applicable. No additional management support is needed unless otherwise documented below in the visit note. 

## 2014-12-23 NOTE — Assessment & Plan Note (Signed)
Several prior statin trials cause intolerable myalgias (simva, atorva, rouva) Reviewed goal LDL<70 given PAD, but denies claudication symptoms Takes prava intermittently - has been advised "some is better than none" Check lipids annually encouraged attention to diet and exercise mgmt of same given poor tol of med 

## 2014-12-23 NOTE — Assessment & Plan Note (Signed)
The patient is asked to make an attempt to improve diet and exercise patterns to aid in medical management of this problem. Check a1c and consider meds as needed Lab Results  Component Value Date   HGBA1C 6.7* 06/22/2014

## 2014-12-23 NOTE — Patient Instructions (Signed)
It was good to see you today.  We have reviewed your prior records including labs and tests today  Prevnar 13 pnuemonia vaccine updated today -  Other Health Maintenance reviewed and all recommended immunizations and age-appropriate screenings are up-to-date.  Test(s) ordered today. Your results will be released to MyChart (or called to you) after review, usually within 72hours after test completion. If any changes need to be made, you will be notified at that same time.  Medications reviewed and updated, no changes recommended at this time. Refill on medication(s) as discussed today.   Please schedule followup in 6 months for semiannual exam and labs, call sooner if problems.

## 2015-02-21 ENCOUNTER — Ambulatory Visit (INDEPENDENT_AMBULATORY_CARE_PROVIDER_SITE_OTHER): Payer: Medicare Other | Admitting: Internal Medicine

## 2015-02-21 ENCOUNTER — Encounter: Payer: Self-pay | Admitting: Internal Medicine

## 2015-02-21 VITALS — BP 144/62 | HR 88 | Temp 98.2°F | Resp 16 | Ht 65.0 in | Wt 180.0 lb

## 2015-02-21 DIAGNOSIS — I350 Nonrheumatic aortic (valve) stenosis: Secondary | ICD-10-CM | POA: Diagnosis not present

## 2015-02-21 DIAGNOSIS — K219 Gastro-esophageal reflux disease without esophagitis: Secondary | ICD-10-CM

## 2015-02-21 NOTE — Patient Instructions (Signed)
We would like you to go back to Dr. Shirlee Latch to make sure that the heart is okay and that the valve is not worse than 2 years ago.   Keep taking the acid reflux medicine for your symptoms.

## 2015-02-21 NOTE — Progress Notes (Signed)
   Subjective:    Patient ID: Ronnie Boyd, male    DOB: 12-24-1926, 81 y.o.   MRN: [id number]  HPI The patient is an 81 YO man who is coming in today for acid reflux. He has had EGD in the past and been diagnosed with a hiatal hernia. This started causing him problems about 1 year ago. He has been taking ranitidine regularly for months and is now symptom free. He does still have some tightness in his chest which comes predictably with exercise around the same time. He stops to rest and it goes away. It is stable. He is taking aspirin daily. Has not seen cardiology in about 2 years. Hx aortic stenosis (mild 2 years ago). Denies this problem at rest. No diaphoresis, nausea, SOB with it. Recovers within 5 minutes.   Review of Systems  Constitutional: Negative for fever, activity change, appetite change, fatigue and unexpected weight change.  Respiratory: Positive for chest tightness. Negative for cough, shortness of breath and wheezing.   Cardiovascular: Negative for chest pain, palpitations and leg swelling.  Gastrointestinal: Negative.   Musculoskeletal: Negative.       Objective:   Physical Exam  Constitutional: He is oriented to person, place, and time. He appears well-developed and well-nourished.  HENT:  Head: Normocephalic and atraumatic.  Eyes: EOM are normal.  Cardiovascular: Normal rate and regular rhythm.   Pulmonary/Chest: Effort normal. No respiratory distress. He has no wheezes. He has no rales.  Abdominal: Soft. Bowel sounds are normal. He exhibits no distension. There is no tenderness. There is no rebound.  Neurological: He is alert and oriented to person, place, and time.  Skin: Skin is warm and dry.  Psychiatric: He has a normal mood and affect.   Filed Vitals:   02/21/15 1327  BP: 144/62  Pulse: 88  Temp: 98.2 F (36.8 C)  TempSrc: Oral  Resp: 16  Height: 5\' 5"  (1.651 m)  Weight: 180 lb (81.647 kg)  SpO2: 99%      Assessment & Plan:

## 2015-02-21 NOTE — Progress Notes (Signed)
Pre visit review using our clinic review tool, if applicable. No additional management support is needed unless otherwise documented below in the visit note. 

## 2015-02-21 NOTE — Assessment & Plan Note (Signed)
Needs follow up with cardiology especially given his new symptoms. Previously mild stenosis but with other findings on echo.

## 2015-02-21 NOTE — Assessment & Plan Note (Signed)
Advised to continue taking ranitidine regularly to avoid symptoms. Asked to return to cardiology as his symptoms could be stable angina and he likely is due for repeat of his aortic stenosis.

## 2015-02-28 ENCOUNTER — Telehealth: Payer: Self-pay | Admitting: Cardiology

## 2015-02-28 NOTE — Telephone Encounter (Signed)
Pt advised I will contact him tomorrow with follow up appt.

## 2015-02-28 NOTE — Telephone Encounter (Signed)
New Message  Pt having trouble w/ palpitations recently- possibly reflux. Pt wife was offered Aug 1 w/ [employer] Jester, but wanted to speak w/ Rn. Can also use mobile # [phone]. Please call back and discuss.

## 2015-02-28 NOTE — Telephone Encounter (Signed)
Pt states he has been advised by his PCP to schedule an appt for cardiology follow up. Pt states he has a history of reflux and indigestion, for several months he has a squeezing below his diaphragm. Pt states this can happen with activity or after a meal when he is laying down, stopping activity or burping can relieve symptoms.

## 2015-03-01 NOTE — Telephone Encounter (Signed)
Pt notified appt scheduled 03/02/15 2 PM with Lady Saucier.

## 2015-03-01 NOTE — Telephone Encounter (Signed)
appt scheduled 03/02/15 2 PM with Lady Saucier  (Flex).

## 2015-03-02 ENCOUNTER — Ambulatory Visit (INDEPENDENT_AMBULATORY_CARE_PROVIDER_SITE_OTHER): Payer: Medicare Other | Admitting: Cardiology

## 2015-03-02 ENCOUNTER — Encounter: Payer: Self-pay | Admitting: Cardiology

## 2015-03-02 VITALS — BP 140/78 | HR 70 | Ht 65.0 in | Wt 180.6 lb

## 2015-03-02 DIAGNOSIS — I350 Nonrheumatic aortic (valve) stenosis: Secondary | ICD-10-CM

## 2015-03-02 DIAGNOSIS — K219 Gastro-esophageal reflux disease without esophagitis: Secondary | ICD-10-CM | POA: Diagnosis not present

## 2015-03-02 DIAGNOSIS — I10 Essential (primary) hypertension: Secondary | ICD-10-CM

## 2015-03-02 DIAGNOSIS — R079 Chest pain, unspecified: Secondary | ICD-10-CM

## 2015-03-02 DIAGNOSIS — E782 Mixed hyperlipidemia: Secondary | ICD-10-CM

## 2015-03-02 MED ORDER — ISOSORBIDE MONONITRATE ER 30 MG PO TB24: 30.0000 mg | ORAL_TABLET | Freq: Every day | ORAL | Status: DC

## 2015-03-02 MED ORDER — PANTOPRAZOLE SODIUM 40 MG PO TBEC: 40.0000 mg | DELAYED_RELEASE_TABLET | Freq: Every day | ORAL | Status: DC

## 2015-03-02 NOTE — Progress Notes (Addendum)
Cardiology Office Note   Date:  03/02/2015   ID:  Ronnie Boyd, DOB [birth year], MRN [id number]  PCP:  Rene Paci, MD  Cardiologist:  Marca Ancona, MD  Chief Complaint  Patient presents with  . Chest Pain    episodes of chest tightness with exertion.  pt called in to be seen      History of Present Illness: Ronnie Boyd is a 81 y.o. male who presents for evaluation of chest tightness. Ronnie Boyd has a history of hypertension, diet controlled diabetes, mild aortic stenosis, hyperlipidemia, PAD hiatal hernia and GERD.  Today Ronnie Boyd reports a 2-3 week history of chest tightness in the substernal/epigastric area that occurs with walking and with physical exertion like working in the yard or taking out the trash and resolves with cessation of activity. It occasionally occurs after eating a meal and is improved with drinking warm water. It lasts a few minutes and is not associated with any shortness of breath, lightheadedness, diaphoresis, nausea or radiation to other areas. He notes a rapid heart rate with exertion, but does not think it is irregular. It has not awakened him during the night. He denies PND or orthopnea. He also denies pain in calves with exercise or at rest. Ronnie Boyd has been taking Maalox before every meal and at bedtime over the last 2-3 weeks to prevent epigastric pain.  Ronnie Boyd monitors his blood pressure with readings usually 130's-140/70's. Recent lipid panel (12/23/2014) shows an LDL of 152, HDL 78, Trig 51. He is taking pravastatin 20 mg without any problems. He did not tolerate Atorvastatin 80 mg due to myalgia. He also takes Omega 3 fatty acids, garlic oil and Co-enzyme Q10.  His most recent echocardiogram on 04/07/2013 showed a LVEF of 65-70%, increased LV wall thickness in a pattern of moderate LVH, mild focal basal hypertrophy of the septum, aortic valve mobility restriction with mild stenosis and mild regurgitation, calcified mitral annulus, grade 1  diastolic dysfunction.  His EKG today has new ST inversions in V3 & V4 compared to his previous EKG on 03/31/2013.  Past Medical History  Diagnosis Date  . Allergic rhinitis   . Hypertension   . PVD (peripheral vascular disease)   . Hypercholesteremia   . Diabetes mellitus     borderline  . GERD (gastroesophageal reflux disease)   . Hemorrhoids   . Prostatitis     hx of  . Degenerative joint disease   . Aortic stenosis     mild echo 7/14    Past Surgical History  Procedure Laterality Date  . Carpal tunnel release      bilateral  . Hemorrhoid surgery  1989     Current Outpatient Prescriptions  Medication Sig Dispense Refill  . alum & mag hydroxide-simeth (MAALOX/MYLANTA) 200-200-20 MG/5ML suspension Take 15 mLs by mouth every 6 (six) hours as needed for indigestion or heartburn.    Marland Kitchen amLODipine (NORVASC) 10 MG tablet Take 1 tablet (10 mg total) by mouth daily. 90 tablet 3  . aspirin 81 MG tablet Take 81 mg by mouth daily.      . Azelastine-Fluticasone (DYMISTA) 137-50 MCG/ACT SUSP Place 1 spray into the nose 2 (two) times daily. 23 g 6  . Coenzyme Q10 200 MG capsule Take one daily    . Garlic Oil 1000 MG CAPS Take 1 capsule by mouth daily.      Marland Kitchen guaiFENesin (MUCINEX) 600 MG 12 hr tablet Take 1,200 mg by mouth 2 (two) times daily.  With plenty of fluids    . loratadine (CLARITIN) 10 MG tablet Take 10 mg by mouth daily.    . Multiple Vitamins-Minerals (CENTRUM SILVER) tablet Take 1 tablet by mouth daily.      . Omega-3 Fatty Acids (FISH OIL) 1000 MG CAPS Take 1 capsule by mouth daily.      . potassium chloride SA (KLOR-CON M20) 20 MEQ tablet Take 1 tablet (20 mEq total) by mouth daily. 90 tablet 3  . pravastatin (PRAVACHOL) 20 MG tablet Take 1 tablet (20 mg total) by mouth daily. 90 tablet 3  . Tamsulosin HCl (FLOMAX) 0.4 MG CAPS Take 1 capsule (0.4 mg total) by mouth as needed. 90 capsule 3  . telmisartan-hydrochlorothiazide (MICARDIS HCT) 80-25 MG per tablet Take 1 tablet by  mouth daily. 90 tablet 3  . isosorbide mononitrate (IMDUR) 30 MG 24 hr tablet Take 1 tablet (30 mg total) by mouth daily. 30 tablet 3  . pantoprazole (PROTONIX) 40 MG tablet Take 1 tablet (40 mg total) by mouth daily. 30 tablet 3   No current facility-administered medications for this visit.    Allergies:   Review of patient's allergies indicates no known allergies.    Social History:  The patient  reports that he has never smoked. He does not have any smokeless tobacco history on file. He reports that he does not drink alcohol or use illicit drugs.   Family History:  The patient's family history includes Arthritis in an other family member; Diabetes in his brother; Diabetes (age of onset: 71) in his mother; Hypertension in his mother; Stroke (age of onset: 27) in his father.    ROS:  General:no colds or fevers, no weight changes Skin:no rashes or ulcers HEENT:no blurred vision, no congestion CV:see HPI PUL:see HPI GI: occ diarrhea  no melena, frequent indigestion GU:no hematuria, no dysuria MS:no joint pain, no claudication Neuro:no syncope, no lightheadedness Endo: + diabetes, no thyroid disease  Wt Readings from Last 3 Encounters:  03/02/15 180 lb 9.6 oz (81.92 kg)  02/21/15 180 lb (81.647 kg)  12/23/14 181 lb (82.101 kg)     PHYSICAL EXAM: VS:  BP 140/78 mmHg  Pulse 70  Ht 5\' 5"  (1.651 m)  Wt 180 lb 9.6 oz (81.92 kg)  BMI 30.05 kg/m2  SpO2 97% , BMI Body mass index is 30.05 kg/(m^2). General:Pleasant affect, NAD Skin:Warm and dry, brisk capillary refill HEENT:normocephalic, sclera clear, mucus membranes moist, + arcus senilis Neck:supple, no JVD, no bruits  Heart:S1S2 RRR with murmur 2/6 in aortic region, no gallup, rub or click Lungs:clear without rales, rhonchi, or wheezes Abd: obese, soft, non tender, + BS, do not palpate liver spleen or masses Ext: trace lower ext edema, 1+ pedal pulses, 2+ radial pulses Neuro:alert and oriented X 3, MAE, follows commands, +  facial symmetry    EKG:  EKG is ordered today. The ekg ordered today demonstrates Sinus rhythm with 1st degree AV block, T wave changes, No ST elevation- new t wave inversion in V 3 though lasteral T wave inversions are old compared to previous EKGs. Marland Kitchen     Recent Labs: 12/23/2014: BUN 16; Creatinine, Ser 1.19; Potassium 4.0; Sodium 135    Lipid Panel    Component Value Date/Time   CHOL 240* 12/23/2014 1209   TRIG 51.0 12/23/2014 1209   HDL 78.00 12/23/2014 1209   CHOLHDL 3 12/23/2014 1209   VLDL 10.2 12/23/2014 1209   LDLCALC 152* 12/23/2014 1209   LDLDIRECT 131.5 11/06/2011 1057  ASSESSMENT AND PLAN:  1.  Chest tightness with exertion. Twave changes on EKG. Concerning for exertional angina, Plan stress test and add Imdur 30 mg daily to start after his test.  He wil follow up for results in 2 weeks in [facility] and if positive then thought to medical therapy vs. Cath.  If pt's pain increases in freg or strength he will go to ER.  Discussed with Dr. Eldridge Dace.   2. GERD with frequent epigastric discomfort. Add pantoprazole and recommend follow up with GI.   2. Hypertension. Controlled. Continue current medical therapy.  3. Hyperlipidemia. Not at goal, LDL 152. Follow up with PCP if + nuc will increase pravastatin or change to crestor.     Current medicines are reviewed with the patient today.  The patient Has no concerns regarding medicines.  The following changes have been made:  See above Labs/ tests ordered today include:see above  Disposition:   FU:  see above  Signed, Ms. Tanna Savoy, NP, NP student [facility] Examination and notes entered under the direct supervision and in the presence of Nada Boozer, FNP-C   I have seen the pt along with Ms. Genever Hentges, NP-Student and have examined the pt. Independently.   We discussed exam and plan along with Dr. Eldridge Dace.  I agree with orders and plan.    Nada Boozer, FNP-C [facility].    03/02/2015 5:52 PM    [facility] [address] [place], [place], [place]  [address]/ [address] [place], [place] Phone: [phone]; Fax: [phone]  [phone]

## 2015-03-02 NOTE — Patient Instructions (Signed)
Medication Instructions:  Your physician has recommended you make the following change in your medication:   Start Isosorbide mononitrate 30 mg by mouth daily (start after lexiscan) Start Protonix 40 mg by mouth daily.      Labwork: none  Testing/Procedures: Your physician has requested that you have a lexiscan myoview. For further information please visit [email or web address] Please follow instruction sheet, as given.  To be done this week or next week.     Follow-Up: Your physician recommends that you schedule a follow-up appointment in: 2 weeks.  Scheduled for March 16, 2015 at 2:00 with Wilburt Finlay, PA

## 2015-03-03 ENCOUNTER — Telehealth (HOSPITAL_COMMUNITY): Payer: Self-pay

## 2015-03-03 NOTE — Telephone Encounter (Signed)
Encounter complete. 

## 2015-03-04 ENCOUNTER — Emergency Department (HOSPITAL_COMMUNITY)
Admission: RE | Admit: 2015-03-04 | Discharge: 2015-03-04 | Disposition: A | Discharge status: Home / Self Care | Payer: Medicare Other | Source: Ambulatory Visit | Attending: Internal Medicine | Admitting: Internal Medicine

## 2015-03-04 ENCOUNTER — Telehealth: Payer: Self-pay | Admitting: Internal Medicine

## 2015-03-04 ENCOUNTER — Other Ambulatory Visit: Payer: Self-pay | Admitting: Internal Medicine

## 2015-03-04 ENCOUNTER — Ambulatory Visit (INDEPENDENT_AMBULATORY_CARE_PROVIDER_SITE_OTHER): Payer: Medicare Other | Admitting: Internal Medicine

## 2015-03-04 ENCOUNTER — Ambulatory Visit (HOSPITAL_COMMUNITY)
Admission: EM | Admit: 2015-03-04 | Discharge: 2015-03-05 | Disposition: A | Discharge status: Home / Self Care | Payer: Medicare Other | Attending: Internal Medicine | Admitting: Internal Medicine

## 2015-03-04 VITALS — BP 156/90 | HR 70 | Temp 97.9°F | Resp 16 | Ht 65.0 in | Wt 180.2 lb

## 2015-03-04 DIAGNOSIS — G459 Transient cerebral ischemic attack, unspecified: Secondary | ICD-10-CM

## 2015-03-04 DIAGNOSIS — H538 Other visual disturbances: Secondary | ICD-10-CM | POA: Diagnosis not present

## 2015-03-04 DIAGNOSIS — I639 Cerebral infarction, unspecified: Secondary | ICD-10-CM | POA: Insufficient documentation

## 2015-03-04 DIAGNOSIS — G4710 Hypersomnia, unspecified: Secondary | ICD-10-CM | POA: Diagnosis not present

## 2015-03-04 DIAGNOSIS — R0789 Other chest pain: Secondary | ICD-10-CM | POA: Diagnosis not present

## 2015-03-04 DIAGNOSIS — R42 Dizziness and giddiness: Secondary | ICD-10-CM | POA: Diagnosis not present

## 2015-03-04 DIAGNOSIS — I6601 Occlusion and stenosis of right middle cerebral artery: Secondary | ICD-10-CM | POA: Insufficient documentation

## 2015-03-04 LAB — POCT CBC
GRANULOCYTE PERCENT: 53.6 % (ref 37–80)
HEMATOCRIT: 46.5 % (ref 43.5–53.7)
Hemoglobin: 14.2 g/dL (ref 14.1–18.1)
Lymph, poc: 1.9 (ref 0.6–3.4)
MCH, POC: 24.7 pg — AB (ref 27–31.2)
MCHC: 30.5 g/dL — AB (ref 31.8–35.4)
MCV: 80.9 fL (ref 80–97)
MID (cbc): 0.7 (ref 0–0.9)
MPV: 8.6 fL (ref 0–99.8)
PLATELET COUNT, POC: 190 10*3/uL (ref 142–424)
POC GRANULOCYTE: 3.1 (ref 2–6.9)
POC LYMPH %: 34.2 % (ref 10–50)
POC MID %: 12.2 % — AB (ref 0–12)
RBC: 5.74 M/uL (ref 4.69–6.13)
RDW, POC: 17.1 %
WBC: 5.7 10*3/uL (ref 4.6–10.2)

## 2015-03-04 NOTE — Progress Notes (Addendum)
Subjective:  This chart was scribed for Ellamae Sia, MD by Broadus John, Medical Scribe. This patient was seen in Room 13 and the patient's care was started at 2:40 PM.   Patient ID: Ronnie Boyd, male    DOB: 19-Mar-1927, 81 y.o.   MRN: [id number]  HPI HPI Comments: Ronnie Boyd is a 81 y.o. male who presents to [facility] complaining of walking abnormalities, onset four daysago. His Wife notes that he is leaning more towards the left side when walking. He notes associated symptoms of dizziness, chest tightness, and blurred vision. Pt indicates that he has been having an increase in his sleeping activity, which is also new. Pt states that walking or doing activity does not cause shortness of breath. Pt denies hands or legs weakness, trouble chewing or swallowing, hearing problems, speech difficulty, or fatigue. No memory loss or trouble with word finding. Pt denies having a past medical history of CVA.  Pt notes that he has had an EKG few days ago with [facility] to differentiate whether he has an acid reflux or a cardiac problem for his chest tightness. Stress test set for 728.  Review of Systems  Constitutional: Negative for fatigue.       Increase sleeping pattern  HENT: Negative for hearing loss and trouble swallowing.   Eyes: Positive for visual disturbance.  Respiratory: Positive for chest tightness. Negative for shortness of breath.   Neurological: Positive for dizziness. Negative for speech difficulty and weakness.       Gait abnormalities        Objective:   Physical Exam  Constitutional: He is oriented to person, place, and time. He appears well-developed and well-nourished. No distress.  HENT:  Head: Normocephalic and atraumatic.  Right Ear: External ear normal.  Left Ear: External ear normal.  Eyes: Conjunctivae and EOM are normal. Pupils are equal, round, and reactive to light.  Neck: Normal range of motion. Neck supple.    Cardiovascular: Normal rate, regular rhythm, S1 normal, S2 normal and intact distal pulses.  PMI is not displaced.   Murmur heard.  Systolic murmur is present with a grade of 2/6  There is 2/6 systolic ejection murmur along the right and left sternal border radiating to the carotids without additional carotid bruit appreciated  Pulmonary/Chest: Effort normal and breath sounds normal.  Neurological: He is alert and oriented to person, place, and time. He has normal strength and normal reflexes. He displays no tremor. No cranial nerve deficit or sensory deficit. He exhibits normal muscle tone. He displays a negative Romberg sign. Coordination normal.  There is mild drooping of the left eyebrow providing asymmetry but this corrects entirely with voluntary movement and there is no ptosis. Has he walks down the hall he uses his left shoulder against the wall to steady himself at times--slow gait is symmetrical Walking down the hall one can observe an asymmetry in the way he holds his arms and a tendency to lean his head to the left--his gait is straight line  Skin: Skin is warm and dry.  Psychiatric: He has a normal mood and affect. His behavior is normal. Thought content normal.  Nursing note and vitals reviewed.  BP 156/90 mmHg  Pulse 70  Temp(Src) 97.9 F (36.6 C) (Oral)  Resp 16  Ht  (1.651 m)  Wt 180 lb 3.2 oz (81.738 kg)  BMI 29.99 kg/m2  SpO2 98%     Assessment & Plan:  Transient cerebral  ischemia versus infarct-minor ischemic stroke  Plan: POCT CBC, MR Brain now   US Carotid Duplex Bilateral-soon  Neurology evaluation-post scan  Continue aspirin  To the emergency room per 911 stroke protocol with any new neurological symptoms  Hypersomnolence - Plan: T4, free//? Related to current symptoms   I have completed the patient encounter in its entirety as documented by the scribe, with editing by me where necessary. Genny Caulder P. Merla Riches, M.D.    Addendum---MRI-A  results MRI=1. Small, acute to early subacute infarcts in the right thalamus and midbrain. 2. Mild chronic small vessel ischemic disease. 3. No major intracranial arterial occlusion. Mild proximal right MCA Stenosis.  Impression-the lack of progression and regression following the initial onset of symptoms suggests an ischemic/embolic phenomena  Plan(discussed with family by phone) 1-continue aspirin///focus on blood pressure control 2-restrict driving for now 3-allow extra naps but activity as tolerated//refrain from mowing the lawn 4-neurology evaluation next week 5-carotid Dopplers  6-needs primary care with [place] to replace Dr. Carleene Cooper--?Dr Fabian Sharp 7-cardiology to consider recent exertional chest pain on Tuesday//? Aortic valve as a source of emboli 8-911 stroke protocol if any new neurological symptoms develop 9-start physical therapy over the next few weeks if his symptoms have not resolved

## 2015-03-05 ENCOUNTER — Telehealth: Payer: Self-pay | Admitting: Internal Medicine

## 2015-03-05 LAB — T4, FREE: Free T4: 1.04 ng/dL (ref 0.80–1.80)

## 2015-03-05 NOTE — Telephone Encounter (Signed)
This was discussed with the family by phone Addendum---MRI-A results MRI=1. Small, acute to early subacute infarcts in the right thalamus and midbrain. 2. Mild chronic small vessel ischemic disease. 3. No major intracranial arterial occlusion. Mild proximal right MCA Stenosis.  Impression-the lack of progression and regression following the initial onset of symptoms suggests an ischemic/embolic phenomena  Plan(discussed with family by phone) 1-continue aspirin///focus on blood pressure control 2-restrict driving for now 3-allow extra naps but activity as tolerated//refrain from mowing the lawn 4-neurology evaluation next week 5-carotid Dopplers  6-needs primary care with [place] to replace Dr. Carleene Cooper--?Dr Fabian Sharp 7-cardiology to consider recent exertional chest pain on Tuesday//? Aortic valve as a source of emboli 8-911 stroke protocol if any new neurological symptoms develop 9-start physical therapy over the next few weeks if his symptoms have not resolved

## 2015-03-05 NOTE — Telephone Encounter (Signed)
MRI=1. Small, acute to early subacute infarcts in the right thalamus and midbrain. 2. Mild chronic small vessel ischemic disease. 3. No major intracranial arterial occlusion. Mild proximal right MCA Stenosis.  Impression-the lack of progression following the initial onset of symptoms suggest an ischemic/embolic phenomena  Plan 1-continue aspirin///focus on blood pressure control 2-restrict driving for now 3-allow extra naps but activity as tolerated//refrain from mowing the lawn 4-neurology evaluation next week 5-carotid Dopplers  6-needs primary care with  to replace Dr. Carleene Cooper--?Dr Fabian Sharp 7-cardiology to consider recent exertional chest pain on Tuesday//? Aortic valve as a source of emboli 8-911 stroke protocol if any new neurological symptoms develop

## 2015-03-05 NOTE — Addendum Note (Signed)
Addended by: Tonye Pearson on: 03/05/2015 10:10 AM   Modules accepted: Medications, Level of Service

## 2015-03-07 ENCOUNTER — Telehealth: Payer: Self-pay

## 2015-03-07 ENCOUNTER — Other Ambulatory Visit: Payer: Self-pay | Admitting: Cardiology

## 2015-03-07 DIAGNOSIS — I639 Cerebral infarction, unspecified: Secondary | ICD-10-CM

## 2015-03-07 NOTE — Telephone Encounter (Signed)
Just fyi for Dr Doolittle-patient has been scheduled on 03/08/15 at 4 pm arriving at 3:40 pm for his US Carotid  Bilateral. He will be seen at [address]. I have given patient this information.

## 2015-03-08 ENCOUNTER — Encounter (HOSPITAL_COMMUNITY): Payer: Self-pay | Admitting: *Deleted

## 2015-03-08 ENCOUNTER — Other Ambulatory Visit (HOSPITAL_COMMUNITY): Payer: Medicare Other

## 2015-03-08 ENCOUNTER — Telehealth (HOSPITAL_COMMUNITY): Payer: Self-pay | Admitting: Cardiology

## 2015-03-08 ENCOUNTER — Ambulatory Visit
Admission: RE | Admit: 2015-03-08 | Discharge: 2015-03-08 | Disposition: A | Discharge status: Home / Self Care | Payer: Medicare Other | Source: Ambulatory Visit | Attending: Internal Medicine | Admitting: Internal Medicine

## 2015-03-08 ENCOUNTER — Ambulatory Visit (HOSPITAL_COMMUNITY)
Admission: RE | Admit: 2015-03-08 | Discharge: 2015-03-08 | Disposition: A | Discharge status: Home / Self Care | Payer: Medicare Other | Source: Ambulatory Visit | Attending: Cardiology | Admitting: Cardiology

## 2015-03-08 ENCOUNTER — Other Ambulatory Visit: Payer: Self-pay | Admitting: Internal Medicine

## 2015-03-08 DIAGNOSIS — R9439 Abnormal result of other cardiovascular function study: Secondary | ICD-10-CM | POA: Insufficient documentation

## 2015-03-08 DIAGNOSIS — R079 Chest pain, unspecified: Secondary | ICD-10-CM

## 2015-03-08 DIAGNOSIS — G459 Transient cerebral ischemic attack, unspecified: Secondary | ICD-10-CM

## 2015-03-08 DIAGNOSIS — I6389 Other cerebral infarction: Secondary | ICD-10-CM

## 2015-03-08 LAB — MYOCARDIAL PERFUSION IMAGING
CHL CUP NUCLEAR SRS: 5
LV dias vol: 99 mL
LVSYSVOL: 41 mL
NUC STRESS TID: 1.18
Peak HR: 90 {beats}/min
Rest HR: 69 {beats}/min
SDS: 7
SSS: 12

## 2015-03-08 MED ORDER — TECHNETIUM TC 99M SESTAMIBI GENERIC - CARDIOLITE
10.8000 | Freq: Once | INTRAVENOUS | Status: AC | PRN
  Administered 2015-03-08: 11 via INTRAVENOUS

## 2015-03-08 MED ORDER — TECHNETIUM TC 99M SESTAMIBI GENERIC - CARDIOLITE
30.2000 | Freq: Once | INTRAVENOUS | Status: AC | PRN
  Administered 2015-03-08: 30.2 via INTRAVENOUS

## 2015-03-08 MED ORDER — REGADENOSON 0.4 MG/5ML IV SOLN
0.4000 mg | Freq: Once | INTRAVENOUS | Status: AC
  Administered 2015-03-08: 0.4 mg via INTRAVENOUS

## 2015-03-08 MED ORDER — AMINOPHYLLINE 25 MG/ML IV SOLN
75.0000 mg | Freq: Once | INTRAVENOUS | Status: AC
  Administered 2015-03-08: 75 mg via INTRAVENOUS

## 2015-03-08 NOTE — Telephone Encounter (Signed)
[name] had an abnormal Cardiolite today and has been having chest pain and had an abnormal Cardiolite today.  I left him a message on his phone.   I think he will need a cardiac catheterization.  I can see him in the office Thursday afternoon as an add-on to discuss this or will arrange directly for cath depending on what he is most comfortable with.  He will also need echo to reassess his aortic stenosis.   Ronnie Boyd 03/08/2015 4:23 PM

## 2015-03-08 NOTE — Progress Notes (Unsigned)
Joyce GrossKay came and said [address] scheduler is to call [name] with his appointment to see Dr. Shirlee LatchMcLean

## 2015-03-08 NOTE — Progress Notes (Unsigned)
12:20pm, Showed MPI images to Dr. Allyson SabalBerry, he stated ok for Ronnie Boyd to leave but he wants him to have an appointment with Dr. Shirlee LatchMcLean within the week. Joyce GrossKay is making Appt.

## 2015-03-09 ENCOUNTER — Ambulatory Visit (HOSPITAL_COMMUNITY): Payer: Medicare Other | Attending: Cardiology

## 2015-03-09 ENCOUNTER — Other Ambulatory Visit: Payer: Self-pay

## 2015-03-09 DIAGNOSIS — I10 Essential (primary) hypertension: Secondary | ICD-10-CM | POA: Diagnosis not present

## 2015-03-09 DIAGNOSIS — E785 Hyperlipidemia, unspecified: Secondary | ICD-10-CM | POA: Insufficient documentation

## 2015-03-09 DIAGNOSIS — I639 Cerebral infarction, unspecified: Secondary | ICD-10-CM

## 2015-03-10 ENCOUNTER — Telehealth: Payer: Self-pay | Admitting: *Deleted

## 2015-03-10 NOTE — Telephone Encounter (Signed)
Discussed with pt, pt confirmed he will keep appt scheduled for 03/16/15.

## 2015-03-10 NOTE — Telephone Encounter (Signed)
-----   Message from Laurey Moralealton S McLean, MD sent at 03/09/2015 11:18 PM EDT ----- Ronnie Boyd had moderate aortic stenosis on echo.  He had an abnormal Cardiolite.  I think I have an appointment with him next week.  He will need cardiac cath.   I talked to him yesterday about the Cardiolite.  Please let him know about echo and plan to set him up for cath.  I can talk further at appt.

## 2015-03-15 ENCOUNTER — Encounter (HOSPITAL_COMMUNITY): Payer: Medicare Other

## 2015-03-16 ENCOUNTER — Encounter: Payer: Self-pay | Admitting: Physician Assistant

## 2015-03-16 ENCOUNTER — Ambulatory Visit (INDEPENDENT_AMBULATORY_CARE_PROVIDER_SITE_OTHER): Payer: Medicare Other | Admitting: Physician Assistant

## 2015-03-16 VITALS — BP 130/70 | HR 94 | Ht 64.0 in | Wt 179.4 lb

## 2015-03-16 DIAGNOSIS — I208 Other forms of angina pectoris: Secondary | ICD-10-CM

## 2015-03-16 DIAGNOSIS — Z01812 Encounter for preprocedural laboratory examination: Secondary | ICD-10-CM

## 2015-03-16 DIAGNOSIS — I209 Angina pectoris, unspecified: Secondary | ICD-10-CM

## 2015-03-16 DIAGNOSIS — I10 Essential (primary) hypertension: Secondary | ICD-10-CM

## 2015-03-16 DIAGNOSIS — I2089 Other forms of angina pectoris: Secondary | ICD-10-CM

## 2015-03-16 DIAGNOSIS — E7800 Pure hypercholesterolemia, unspecified: Secondary | ICD-10-CM

## 2015-03-16 LAB — PROTIME-INR
INR: 1 ratio (ref 0.8–1.0)
Prothrombin Time: 11.6 s (ref 9.6–13.1)

## 2015-03-16 LAB — BASIC METABOLIC PANEL
BUN: 13 mg/dL (ref 6–23)
CALCIUM: 10 mg/dL (ref 8.4–10.5)
CO2: 30 mEq/L (ref 19–32)
CREATININE: 1.27 mg/dL (ref 0.40–1.50)
Chloride: 99 mEq/L (ref 96–112)
GFR: 69.21 mL/min (ref >60.00)
GLUCOSE: 96 mg/dL (ref 70–99)
POTASSIUM: 3.7 meq/L (ref 3.5–5.1)
Sodium: 137 mEq/L (ref 135–145)

## 2015-03-16 MED ORDER — METOPROLOL TARTRATE 25 MG PO TABS: 12.5000 mg | ORAL_TABLET | Freq: Two times a day (BID) | ORAL | Status: DC

## 2015-03-16 NOTE — Patient Instructions (Signed)
Medication Instructions:  Your physician has recommended you make the following change in your medication:  START Lopressor 12.5 mg by mouth twice daily HOLD Micardis HCT for procedure HOLD Claritin and Garlic Oil for procedure   Labwork: Your physician recommends that you have labs today, BMET, CBC, and INR/PT   Testing/Procedures: Please see letter for Heart Cath.  Follow-Up: Your physician recommends that you follow-up as directed by Dr. Shirlee LatchMcLean after procedure on Friday.   Any Other Special Instructions Will Be Listed Below (If Applicable).

## 2015-03-16 NOTE — Assessment & Plan Note (Signed)
Continue statin. 

## 2015-03-16 NOTE — Assessment & Plan Note (Signed)
This is now moderate by recent echocardiogram. We'll need to continue to follow.

## 2015-03-16 NOTE — Assessment & Plan Note (Addendum)
Test post abnormal Cardiolite stress test. Patient be scheduled for left heart catheterization this coming Friday at 7:30. He is on aspirin and Imdur. Will add Lopressor 12.5 mg twice daily. Continue statin.  Risks have been discussed including but are not limited to bleeding, infection, vascular injury, stroke, myocardial infection, arrhythmia, kidney injury, radiation-related injury in the case of prolonged fluoroscopy use, emergency cardiac surgery, and death. The patient understands the risks of serious complication is low (<1%).   Precath labs have been ordered.

## 2015-03-16 NOTE — Progress Notes (Signed)
Patient ID: Ronnie Boyd, male   DOB: [birth year], 81 y.o.   MRN: [id number]    Date:  03/16/2015   ID:  Ronnie Boyd, DOB [birth year], MRN [id number]  PCP:  Rene PaciValerie Leschber, MD  Primary Cardiologist:  Shirlee LatchMcLean  Chief Complaint: Chest pain with exertion   History of Present Illness: Ronnie Boyd is a 81 y.o. male patient is an 81 year old male with history of hypertension, peripheral vascular disease, hyperlipidemia, diabetes mellitus GERD, aortic stenosis and acid reflux..  Recently had an abnormal Cardiolite stress test and will need a left heart cath.  Most recent echo was 03/09/2015 and the ejection fraction was 65-70% with normal wall motion. His moderate aortic stenosis and mild regurgitation.  Patient had a MRA of the head on 03/04/2015 showed a small acute distress early subacute infarcts in the right thalamus and midbrain. There is mild chronic small vessel ischemic disease but no major intracranial arterial occlusion. Mild proximal right MCA stenosis.  Patient has been complaining chest pain with exertion. He describes as a tightness. His associate with shortness of breath.  The patient currently denies nausea, vomiting, fever, orthopnea, dizziness, PND, cough, congestion, abdominal pain, hematochezia, melena, lower extremity edema, claudication.  Wt Readings from Last 3 Encounters:  03/16/15 179 lb 6.4 oz (81.375 kg)  03/08/15 180 lb (81.647 kg)  03/04/15 180 lb 3.2 oz (81.738 kg)     Past Medical History  Diagnosis Date  . Allergic rhinitis   . Hypertension   . PVD (peripheral vascular disease)   . Hypercholesteremia   . Diabetes mellitus     borderline  . GERD (gastroesophageal reflux disease)   . Hemorrhoids   . Prostatitis     hx of  . Degenerative joint disease   . Aortic stenosis     mild echo 7/14  . Acid reflux     Current Outpatient Prescriptions  Medication Sig Dispense Refill  . alum & mag hydroxide-simeth (MAALOX/MYLANTA) 200-200-20 MG/5ML suspension  Take 15 mLs by mouth every 6 (six) hours as needed for indigestion or heartburn.    Marland Kitchen. amLODipine (NORVASC) 10 MG tablet Take 1 tablet (10 mg total) by mouth daily. 90 tablet 3  . aspirin 81 MG tablet Take 81 mg by mouth daily.      . Azelastine-Fluticasone (DYMISTA) 137-50 MCG/ACT SUSP Place 1 spray into the nose 2 (two) times daily. 23 Boyd 6  . Coenzyme Q10 200 MG capsule Take one daily    . Garlic Oil 1000 MG CAPS Take 1 capsule by mouth daily.      Marland Kitchen. guaiFENesin (MUCINEX) 600 MG 12 hr tablet Take 1,200 mg by mouth 2 (two) times daily as needed for to loosen phlegm. With plenty of fluids    . isosorbide mononitrate (IMDUR) 30 MG 24 hr tablet Take 1 tablet (30 mg total) by mouth daily. 30 tablet 3  . loratadine (CLARITIN) 10 MG tablet Take 10 mg by mouth daily as needed for rhinitis.     . Multiple Vitamins-Minerals (CENTRUM SILVER) tablet Take 1 tablet by mouth daily.      . Omega-3 Fatty Acids (FISH OIL) 1000 MG CAPS Take 1 capsule by mouth daily.      . pantoprazole (PROTONIX) 40 MG tablet Take 1 tablet (40 mg total) by mouth daily. 30 tablet 3  . potassium chloride SA (KLOR-CON M20) 20 MEQ tablet Take 1 tablet (20 mEq total) by mouth daily. 90 tablet 3  . pravastatin (PRAVACHOL) 20 MG tablet Take 1  tablet (20 mg total) by mouth daily. 90 tablet 3  . Tamsulosin HCl (FLOMAX) 0.4 MG CAPS Take 1 capsule (0.4 mg total) by mouth as needed. 90 capsule 3  . telmisartan-hydrochlorothiazide (MICARDIS HCT) 80-25 MG per tablet Take 1 tablet by mouth daily. 90 tablet 3  . metoprolol tartrate (LOPRESSOR) 25 MG tablet Take 0.5 tablets (12.5 mg total) by mouth 2 (two) times daily. 180 tablet 3   No current facility-administered medications for this visit.    Allergies:   No Known Allergies  Social History:  The patient  reports that he has never smoked. He does not have any smokeless tobacco history on file. He reports that he does not drink alcohol or use illicit drugs.   Family history:   Family  History  Problem Relation Age of Onset  . Arthritis    . Hypertension Mother   . Diabetes Mother 81  . Stroke Father 41  . Diabetes Brother     ROS:  Please see the history of present illness.  All other systems reviewed and negative.   PHYSICAL EXAM: VS:  BP 130/70 mmHg  Pulse 94  Ht 5' 4" (1.626 m)  Wt 179 lb 6.4 oz (81.375 kg)  BMI 30.78 kg/m2 Well nourished, well developed, in no acute distress HEENT: Pupils are equal round react to light accommodation extraocular movements are intact.  Neck: no JVDNo cervical lymphadenopathy. Cardiac: Regular rate and rhythm 2/6 systolic murmur  Lungs:  clear to auscultation bilaterally, no wheezing, rhonchi or rales Abd: soft, nontender, positive bowel sounds all quadrants, no hepatosplenomegaly Ext: 1+ lower extremity edema.  2+ radial and dorsalis pedis pulses. Skin: warm and dry Neuro:  Grossly normal      ASSESSMENT AND PLAN:  Problem List Items Addressed This Visit    Stable angina - Primary    Test post abnormal Cardiolite stress test. Patient be scheduled for left heart catheterization this coming Friday at 7:30. He is on aspirin and Imdur. Will add Lopressor 12.5 mg twice daily. Continue statin.  Risks have been discussed including but are not limited to bleeding, infection, vascular injury, stroke, myocardial infection, arrhythmia, kidney injury, radiation-related injury in the case of prolonged fluoroscopy use, emergency cardiac surgery, and death. The patient understands the risks of serious complication is low (<1%).   Precath labs have been ordered.       Relevant Medications   metoprolol tartrate (LOPRESSOR) 25 MG tablet   HYPERCHOLESTEROLEMIA    Continue statin      Relevant Medications   metoprolol tartrate (LOPRESSOR) 25 MG tablet   Essential hypertension    Blood pressure at the upper limits of controlled Will add 12.5 twice daily Lopressor.      Relevant Medications   metoprolol tartrate (LOPRESSOR) 25 MG  tablet    Other Visit Diagnoses    Pre-procedure lab exam        Relevant Orders    Basic Metabolic Panel (BMET)    CBC with Differential    INR/PT          

## 2015-03-16 NOTE — Assessment & Plan Note (Signed)
Blood pressure at the upper limits of controlled Will add 12.5 twice daily Lopressor.

## 2015-03-17 ENCOUNTER — Ambulatory Visit (INDEPENDENT_AMBULATORY_CARE_PROVIDER_SITE_OTHER): Payer: Medicare Other | Admitting: Neurology

## 2015-03-17 ENCOUNTER — Encounter: Payer: Self-pay | Admitting: Neurology

## 2015-03-17 VITALS — BP 149/65 | HR 61 | Ht 65.0 in | Wt 178.0 lb

## 2015-03-17 DIAGNOSIS — G4710 Hypersomnia, unspecified: Secondary | ICD-10-CM | POA: Diagnosis not present

## 2015-03-17 DIAGNOSIS — I639 Cerebral infarction, unspecified: Secondary | ICD-10-CM

## 2015-03-17 DIAGNOSIS — G4730 Sleep apnea, unspecified: Secondary | ICD-10-CM | POA: Diagnosis not present

## 2015-03-17 DIAGNOSIS — E785 Hyperlipidemia, unspecified: Secondary | ICD-10-CM | POA: Diagnosis not present

## 2015-03-17 DIAGNOSIS — I6381 Other cerebral infarction due to occlusion or stenosis of small artery: Secondary | ICD-10-CM | POA: Insufficient documentation

## 2015-03-17 LAB — CBC WITH DIFFERENTIAL/PLATELET
Basophils Absolute: 0 10*3/uL (ref 0.0–0.1)
Basophils Relative: 0.5 % (ref 0.0–3.0)
EOS ABS: 0.2 10*3/uL (ref 0.0–0.7)
Eosinophils Relative: 3.5 % (ref 0.0–5.0)
HCT: 46 % (ref 39.0–52.0)
Hemoglobin: 14.9 g/dL (ref 13.0–17.0)
Lymphocytes Relative: 22 % (ref 12.0–46.0)
Lymphs Abs: 1.4 10*3/uL (ref 0.7–4.0)
MCHC: 32.4 g/dL (ref 30.0–36.0)
MCV: 79.9 fl (ref 78.0–100.0)
MONO ABS: 1 10*3/uL (ref 0.1–1.0)
Monocytes Relative: 16.1 % — ABNORMAL HIGH (ref 3.0–12.0)
NEUTROS ABS: 3.7 10*3/uL (ref 1.4–7.7)
Neutrophils Relative %: 57.9 % (ref 43.0–77.0)
PLATELETS: 212 10*3/uL (ref 150.0–400.0)
RBC: 5.75 Mil/uL (ref 4.22–5.81)
RDW: 15.9 % — AB (ref 11.5–15.5)
WBC: 6.4 10*3/uL (ref 4.0–10.5)

## 2015-03-17 MED ORDER — CLOPIDOGREL BISULFATE 75 MG PO TABS: 75.0000 mg | ORAL_TABLET | Freq: Every day | ORAL | Status: DC

## 2015-03-17 NOTE — Progress Notes (Signed)
[name] [address] [place]. [place] [address] [phone]       OFFICE CONSULT NOTE  Mr. Ronnie Boyd Date of Birth:  December 30, 1926 Medical Record Number:  [id number]   Referring MD:  Ellamae Sia  Reason for Referral:  Stroke  HPI: [name] is a pleasant 65 year African-American male who was noted by family to develop excessive sleepiness a few weeks ago. The symptoms lasted for several days before he saw his primary physician who also noticed that he was slightly off balance when walking. The wife and also noticed slight drooping of his right eyelid but the patient denied any diplopia, loss of vision, blurred vision, headache, speech difficulties. Over the last week or so his symptoms have improved and is no longer as sleepy and is walking much better. The patient had an outpatient MRI scan of the brain done on 03/04/15 which have personally reviewed and shows a small acute right medial thalamic/midbrain lacunar infarct. MRA of the brain shows no large vessel and the canal stenosis. Carotid ultrasound done on 03/09/15 reviewed by me shows no significant extracranial stenosis. Patient also had transthoracic echo done which was normal. He had a nuclear medicine scan done 5 admission for chest pain or shortness of breath which was abnormal and is scheduled to undergo cardiac catheterization tomorrow by Dr. Marca Ancona. He has no prior history of strokes, TIAs. He was started recently on aspirin 81 mg daily as well as protocol which has taken only for 4 weeks. He denies smoking or drinking excessive alcohol. There is no family history of strokes.  ROS:   14 system review of systems is positive for  Chest pain, palpitations, murmur, leg swelling, shortness of breath, snoring, dizziness, excessive sleepiness, allergies and all other systems negative PMH:  Past Medical History  Diagnosis Date  . Allergic rhinitis   . Hypertension   . PVD (peripheral vascular disease)    . Hypercholesteremia   . Diabetes mellitus     borderline  . GERD (gastroesophageal reflux disease)   . Hemorrhoids   . Prostatitis     hx of  . Degenerative joint disease   . Aortic stenosis     mild echo 7/14  . Acid reflux     Social History:  History   Social History  . Marital Status: Married    Spouse Name: [name]  . Number of Children: [name]  . Years of Education: [name]   Occupational History  . retired    Social History Main Topics  . Smoking status: Never Smoker   . Smokeless tobacco: Not on file  . Alcohol Use: No  . Drug Use: No  . Sexual Activity: Not on file   Other Topics Concern  . Not on file   Social History Narrative    Medications:   Current Outpatient Prescriptions on File Prior to Visit  Medication Sig Dispense Refill  . alum & mag hydroxide-simeth (MAALOX/MYLANTA) 200-200-20 MG/5ML suspension Take 15 mLs by mouth every 6 (six) hours as needed for indigestion or heartburn.    Marland Kitchen amLODipine (NORVASC) 10 MG tablet Take 1 tablet (10 mg total) by mouth daily. 90 tablet 3  . Azelastine-Fluticasone (DYMISTA) 137-50 MCG/ACT SUSP Place 1 spray into the nose 2 (two) times daily. (Patient taking differently: Place 1 spray into the nose as needed. ) 23 g 6  . Coenzyme Q10 200 MG capsule Take one daily    . Garlic Oil 1000 MG CAPS Take 1 capsule  by mouth daily.      Marland Kitchen guaiFENesin (MUCINEX) 600 MG 12 hr tablet Take 1,200 mg by mouth 2 (two) times daily as needed for to loosen phlegm. With plenty of fluids    . isosorbide mononitrate (IMDUR) 30 MG 24 hr tablet Take 1 tablet (30 mg total) by mouth daily. 30 tablet 3  . loratadine (CLARITIN) 10 MG tablet Take 10 mg by mouth daily as needed for rhinitis.     . metoprolol tartrate (LOPRESSOR) 25 MG tablet Take 0.5 tablets (12.5 mg total) by mouth 2 (two) times daily. 180 tablet 3  . Multiple Vitamins-Minerals (CENTRUM SILVER) tablet Take 1 tablet by mouth daily.      . Omega-3 Fatty Acids (FISH OIL) 1000 MG CAPS Take 1  capsule by mouth daily.      . pantoprazole (PROTONIX) 40 MG tablet Take 1 tablet (40 mg total) by mouth daily. 30 tablet 3  . potassium chloride SA (KLOR-CON M20) 20 MEQ tablet Take 1 tablet (20 mEq total) by mouth daily. 90 tablet 3  . pravastatin (PRAVACHOL) 20 MG tablet Take 1 tablet (20 mg total) by mouth daily. 90 tablet 3  . Tamsulosin HCl (FLOMAX) 0.4 MG CAPS Take 1 capsule (0.4 mg total) by mouth as needed. 90 capsule 3  . telmisartan-hydrochlorothiazide (MICARDIS HCT) 80-25 MG per tablet Take 1 tablet by mouth daily. 90 tablet 3   No current facility-administered medications on file prior to visit.    Allergies:  No Known Allergies  Physical Exam General: Mildly obese elderly African-American male, seated, in no evident distress Head: head normocephalic and atraumatic.   Neck: supple with no carotid or supraclavicular bruits Cardiovascular: regular rate and rhythm, harsh ejection systolic murmur heard all over the precordium and on the right side of the sternum Musculoskeletal: no deformity Skin:  no rash/petichiae Vascular:  Normal pulses all extremities  Neurologic Exam Mental Status: Awake and fully alert. Oriented to place and time. Recent and remote memory intact. Attention span, concentration and fund of knowledge appropriate. Mood and affect appropriate. Diminished recall 0/3. Animal naming 9. Cranial Nerves: Fundoscopic exam reveals sharp disc margins. Pupils equal, briskly reactive to light. Extraocular movements full without nystagmus. There is no eyelid drooping or Horner`s syndrome noted Visual fields full to confrontation. Hearing intact. Facial sensation intact. Face, tongue, palate moves normally and symmetrically.  Motor: Normal bulk and tone. Normal strength in all tested extremity muscles. Sensory.: intact to touch , pinprick , position and vibratory sensation.  Coordination: Rapid alternating movements normal in all extremities. Finger-to-nose and heel-to-shin  performed accurately bilaterally. Gait and Station: Arises from chair without difficulty. Stance is normal. Gait demonstrates normal stride length and balance . Able to heel, toe and tandem walk without difficulty.  Reflexes: 1+ and symmetric. Toes downgoing.   NIHSS 0 Modified Rankin  1   ASSESSMENT: 41 year pleasant African-American male with transient episode of excessive daytime sleepiness, drooping of the right eyelid and gait and balance difficulties due to right thalamic/midbrain lacunar infarct likely from small vessel disease with multiple vascular risk factors of hypertension, hyperlipidemia, obesity, age and suspected sleep apnea     PLAN: I had a long d/w patient , daughter and wife about his recent stroke, risk for recurrent stroke/TIAs, personally independently reviewed imaging studies and stroke evaluation results and answered questions.Change aspirin to Plavix 75 mg daily  for secondary stroke prevention and maintain strict control of hypertension with blood pressure goal below 130/90, diabetes with hemoglobin A1c goal  below 6.5% and lipids with LDL cholesterol goal below 100 mg/dL. I also advised the patient to eat a healthy diet with plenty of whole grains, cereals, fruits and vegetables, exercise regularly and maintain ideal body weight .check polysomnogram for sleep apnea. Check fasting lipid profile and hemoglobin A1c. He was cleared to drive but advised to limit his driving to familiar areas. Followup in the future with me in 2 months or call earlier if necessary. Delia HeadyPramod Shuayb Schepers, MD  Note: This document was prepared with digital dictation and possible smart phrase technology. Any transcriptional errors that result from this process are unintentional.

## 2015-03-17 NOTE — Patient Instructions (Signed)
I had a long d/w patient , daughter and wife about his recent stroke, risk for recurrent stroke/TIAs, personally independently reviewed imaging studies and stroke evaluation results and answered questions.Change aspirin to Plavix 75 mg daily  for secondary stroke prevention and maintain strict control of hypertension with blood pressure goal below 130/90, diabetes with hemoglobin A1c goal below 6.5% and lipids with LDL cholesterol goal below 100 mg/dL. I also advised the patient to eat a healthy diet with plenty of whole grains, cereals, fruits and vegetables, exercise regularly and maintain ideal body weight .check polysomnogram for sleep apnea. Check fasting lipid profile and hemoglobin A1c. Followup in the future with me in 2 months or call earlier if necessary. Stroke Prevention Some medical conditions and behaviors are associated with an increased chance of having a stroke. You may prevent a stroke by making healthy choices and managing medical conditions. HOW CAN I REDUCE MY RISK OF HAVING A STROKE?   Stay physically active. Get at least 30 minutes of activity on most or all days.  Do not smoke. It may also be helpful to avoid exposure to secondhand smoke.  Limit alcohol use. Moderate alcohol use is considered to be:  No more than 2 drinks per day for men.  No more than 1 drink per day for nonpregnant women.  Eat healthy foods. This involves:  Eating 5 or more servings of fruits and vegetables a day.  Making dietary changes that address high blood pressure (hypertension), high cholesterol, diabetes, or obesity.  Manage your cholesterol levels.  Making food choices that are high in fiber and low in saturated fat, trans fat, and cholesterol may control cholesterol levels.  Take any prescribed medicines to control cholesterol as directed by your health care provider.  Manage your diabetes.  Controlling your carbohydrate and sugar intake is recommended to manage diabetes.  Take any  prescribed medicines to control diabetes as directed by your health care provider.  Control your hypertension.  Making food choices that are low in salt (sodium), saturated fat, trans fat, and cholesterol is recommended to manage hypertension.  Take any prescribed medicines to control hypertension as directed by your health care provider.  Maintain a healthy weight.  Reducing calorie intake and making food choices that are low in sodium, saturated fat, trans fat, and cholesterol are recommended to manage weight.  Stop drug abuse.  Avoid taking birth control pills.  Talk to your health care provider about the risks of taking birth control pills if you are over 7 years old, smoke, get migraines, or have ever had a blood clot.  Get evaluated for sleep disorders (sleep apnea).  Talk to your health care provider about getting a sleep evaluation if you snore a lot or have excessive sleepiness.  Take medicines only as directed by your health care provider.  For some people, aspirin or blood thinners (anticoagulants) are helpful in reducing the risk of forming abnormal blood clots that can lead to stroke. If you have the irregular heart rhythm of atrial fibrillation, you should be on a blood thinner unless there is a good reason you cannot take them.  Understand all your medicine instructions.  Make sure that other conditions (such as anemia or atherosclerosis) are addressed. SEEK IMMEDIATE MEDICAL CARE IF:   You have sudden weakness or numbness of the face, arm, or leg, especially on one side of the body.  Your face or eyelid droops to one side.  You have sudden confusion.  You have trouble speaking (aphasia)  or understanding.  You have sudden trouble seeing in one or both eyes.  You have sudden trouble walking.  You have dizziness.  You have a loss of balance or coordination.  You have a sudden, severe headache with no known cause.  You have new chest pain or an irregular  heartbeat. Any of these symptoms may represent a serious problem that is an emergency. Do not wait to see if the symptoms will go away. Get medical help at once. Call your local emergency services (911 in [place] Do not drive yourself to the hospital. Document Released: 10/04/2004 Document Revised: 01/11/2014 Document Reviewed: 02/27/2013 [facility] Patient Information 2015 [employer], [place]. This information is not intended to replace advice given to you by your health care provider. Make sure you discuss any questions you have with your health care provider.

## 2015-03-18 ENCOUNTER — Encounter (HOSPITAL_COMMUNITY)
Admission: RE | Disposition: A | Discharge status: Home / Self Care | Payer: Medicare Other | Source: Ambulatory Visit | Attending: Cardiology

## 2015-03-18 ENCOUNTER — Encounter (HOSPITAL_COMMUNITY): Payer: Self-pay | Admitting: Cardiology

## 2015-03-18 ENCOUNTER — Ambulatory Visit (HOSPITAL_COMMUNITY)
Admission: RE | Admit: 2015-03-18 | Discharge: 2015-03-18 | Disposition: A | Discharge status: Home / Self Care | Payer: Medicare Other | Source: Ambulatory Visit | Attending: Cardiology | Admitting: Cardiology

## 2015-03-18 DIAGNOSIS — I25119 Atherosclerotic heart disease of native coronary artery with unspecified angina pectoris: Secondary | ICD-10-CM | POA: Insufficient documentation

## 2015-03-18 DIAGNOSIS — Z7982 Long term (current) use of aspirin: Secondary | ICD-10-CM | POA: Diagnosis not present

## 2015-03-18 DIAGNOSIS — I350 Nonrheumatic aortic (valve) stenosis: Secondary | ICD-10-CM | POA: Diagnosis not present

## 2015-03-18 DIAGNOSIS — E119 Type 2 diabetes mellitus without complications: Secondary | ICD-10-CM | POA: Insufficient documentation

## 2015-03-18 DIAGNOSIS — I10 Essential (primary) hypertension: Secondary | ICD-10-CM | POA: Insufficient documentation

## 2015-03-18 DIAGNOSIS — K219 Gastro-esophageal reflux disease without esophagitis: Secondary | ICD-10-CM | POA: Diagnosis not present

## 2015-03-18 DIAGNOSIS — E785 Hyperlipidemia, unspecified: Secondary | ICD-10-CM | POA: Insufficient documentation

## 2015-03-18 DIAGNOSIS — I2584 Coronary atherosclerosis due to calcified coronary lesion: Secondary | ICD-10-CM | POA: Insufficient documentation

## 2015-03-18 DIAGNOSIS — E780 Pure hypercholesterolemia: Secondary | ICD-10-CM | POA: Diagnosis not present

## 2015-03-18 DIAGNOSIS — I2510 Atherosclerotic heart disease of native coronary artery without angina pectoris: Secondary | ICD-10-CM | POA: Diagnosis not present

## 2015-03-18 DIAGNOSIS — I739 Peripheral vascular disease, unspecified: Secondary | ICD-10-CM | POA: Diagnosis not present

## 2015-03-18 DIAGNOSIS — R079 Chest pain, unspecified: Secondary | ICD-10-CM | POA: Diagnosis present

## 2015-03-18 HISTORY — PX: CARDIAC CATHETERIZATION: SHX172

## 2015-03-18 SURGERY — LEFT HEART CATH AND CORONARY ANGIOGRAPHY
Anesthesia: LOCAL

## 2015-03-18 MED ORDER — LIDOCAINE HCL (PF) 1 % IJ SOLN
INTRAMUSCULAR | Status: DC | PRN
  Administered 2015-03-18: 1 mL via SUBCUTANEOUS

## 2015-03-18 MED ORDER — MIDAZOLAM HCL 2 MG/2ML IJ SOLN
INTRAMUSCULAR | Status: AC
  Filled 2015-03-18: qty 2

## 2015-03-18 MED ORDER — SODIUM CHLORIDE 0.9 % WEIGHT BASED INFUSION: 1.0000 mL/kg/h | INTRAVENOUS | Status: DC

## 2015-03-18 MED ORDER — VERAPAMIL HCL 2.5 MG/ML IV SOLN
INTRAVENOUS | Status: AC
  Filled 2015-03-18: qty 2

## 2015-03-18 MED ORDER — SODIUM CHLORIDE 0.9 % IJ SOLN: 3.0000 mL | INTRAMUSCULAR | Status: DC | PRN

## 2015-03-18 MED ORDER — SODIUM CHLORIDE 0.9 % WEIGHT BASED INFUSION: 3.0000 mL/kg/h | INTRAVENOUS | Status: DC

## 2015-03-18 MED ORDER — NITROGLYCERIN 1 MG/10 ML FOR IR/CATH LAB
INTRA_ARTERIAL | Status: AC
  Filled 2015-03-18: qty 10

## 2015-03-18 MED ORDER — ACETAMINOPHEN 325 MG PO TABS: 650.0000 mg | ORAL_TABLET | ORAL | Status: DC | PRN

## 2015-03-18 MED ORDER — HEPARIN (PORCINE) IN NACL 2-0.9 UNIT/ML-% IJ SOLN
INTRAMUSCULAR | Status: AC
  Filled 2015-03-18: qty 1000

## 2015-03-18 MED ORDER — SODIUM CHLORIDE 0.9 % IV SOLN: 250.0000 mL | INTRAVENOUS | Status: DC | PRN

## 2015-03-18 MED ORDER — HEPARIN SODIUM (PORCINE) 1000 UNIT/ML IJ SOLN
INTRAMUSCULAR | Status: AC
  Filled 2015-03-18: qty 1

## 2015-03-18 MED ORDER — MIDAZOLAM HCL 2 MG/2ML IJ SOLN
INTRAMUSCULAR | Status: DC | PRN
  Administered 2015-03-18: 1 mg via INTRAVENOUS

## 2015-03-18 MED ORDER — FENTANYL CITRATE (PF) 100 MCG/2ML IJ SOLN
INTRAMUSCULAR | Status: AC
  Filled 2015-03-18: qty 2

## 2015-03-18 MED ORDER — FENTANYL CITRATE (PF) 100 MCG/2ML IJ SOLN
INTRAMUSCULAR | Status: DC | PRN
  Administered 2015-03-18: 25 ug via INTRAVENOUS

## 2015-03-18 MED ORDER — ASPIRIN 81 MG PO CHEW: 81.0000 mg | CHEWABLE_TABLET | ORAL | Status: DC

## 2015-03-18 MED ORDER — LIDOCAINE HCL (PF) 1 % IJ SOLN
INTRAMUSCULAR | Status: AC
  Filled 2015-03-18: qty 30

## 2015-03-18 MED ORDER — SODIUM CHLORIDE 0.9 % WEIGHT BASED INFUSION
3.0000 mL/kg/h | INTRAVENOUS | Status: AC
Start: 2015-03-18 — End: 2015-03-18
  Administered 2015-03-18: 3 mL/kg/h via INTRAVENOUS

## 2015-03-18 MED ORDER — ONDANSETRON HCL 4 MG/2ML IJ SOLN: 4.0000 mg | Freq: Four times a day (QID) | INTRAMUSCULAR | Status: DC | PRN

## 2015-03-18 MED ORDER — HEPARIN (PORCINE) IN NACL 2-0.9 UNIT/ML-% IJ SOLN
INTRAMUSCULAR | Status: DC | PRN
  Administered 2015-03-18: 08:00:00

## 2015-03-18 MED ORDER — VERAPAMIL HCL 2.5 MG/ML IV SOLN
INTRAVENOUS | Status: DC | PRN
  Administered 2015-03-18: 08:00:00 via INTRA_ARTERIAL

## 2015-03-18 MED ORDER — HEPARIN SODIUM (PORCINE) 1000 UNIT/ML IJ SOLN
INTRAMUSCULAR | Status: DC | PRN
  Administered 2015-03-18: 4000 [IU] via INTRAVENOUS

## 2015-03-18 MED ORDER — SODIUM CHLORIDE 0.9 % IJ SOLN: 3.0000 mL | Freq: Two times a day (BID) | INTRAMUSCULAR | Status: DC

## 2015-03-18 MED ORDER — IOHEXOL 350 MG/ML SOLN
INTRAVENOUS | Status: DC | PRN
  Administered 2015-03-18: 55 mL via INTRA_ARTERIAL

## 2015-03-18 SURGICAL SUPPLY — 11 items

## 2015-03-18 NOTE — Progress Notes (Signed)
1. Suspected CAD (No Prior PCI, No Prior CABG, and No Prior Angiogram Showing > = 50% Angiographic Stenosis) 2. Prior Noninvasive Testing: Stress Test With Imaging (SPECT MPI, Stress Echocardiography, Stress PET, Stress CMR) 3. Intermediate-risk findings (e.g., 5% to 10% ischemic myocardium on stress SPECT MPI or stress PET, stress-induced wall motion abnormality in a single segment on stress echo or stress CMR) 4. Pretest Symptom Status: Symptomatic A (7) Indication: 16;  Ronnie Boyd 03/18/2015 7:52 AM

## 2015-03-18 NOTE — H&P (View-Only) (Signed)
Patient ID: Margie BilletWilliam G Mostek, male   DOB: [birth year], 81 y.o.   MRN: [id number]    Date:  03/16/2015   ID:  Margie BilletWilliam G Canady, DOB [birth year], MRN [id number]  PCP:  Rene PaciValerie Leschber, MD  Primary Cardiologist:  Shirlee LatchMcLean  Chief Complaint: Chest pain with exertion   History of Present Illness: Margie BilletWilliam G Brosseau is a 81 y.o. male patient is an 81 year old male with history of hypertension, peripheral vascular disease, hyperlipidemia, diabetes mellitus GERD, aortic stenosis and acid reflux..  Recently had an abnormal Cardiolite stress test and will need a left heart cath.  Most recent echo was 03/09/2015 and the ejection fraction was 65-70% with normal wall motion. His moderate aortic stenosis and mild regurgitation.  Patient had a MRA of the head on 03/04/2015 showed a small acute distress early subacute infarcts in the right thalamus and midbrain. There is mild chronic small vessel ischemic disease but no major intracranial arterial occlusion. Mild proximal right MCA stenosis.  Patient has been complaining chest pain with exertion. He describes as a tightness. His associate with shortness of breath.  The patient currently denies nausea, vomiting, fever, orthopnea, dizziness, PND, cough, congestion, abdominal pain, hematochezia, melena, lower extremity edema, claudication.  Wt Readings from Last 3 Encounters:  03/16/15 179 lb 6.4 oz (81.375 kg)  03/08/15 180 lb (81.647 kg)  03/04/15 180 lb 3.2 oz (81.738 kg)     Past Medical History  Diagnosis Date  . Allergic rhinitis   . Hypertension   . PVD (peripheral vascular disease)   . Hypercholesteremia   . Diabetes mellitus     borderline  . GERD (gastroesophageal reflux disease)   . Hemorrhoids   . Prostatitis     hx of  . Degenerative joint disease   . Aortic stenosis     mild echo 7/14  . Acid reflux     Current Outpatient Prescriptions  Medication Sig Dispense Refill  . alum & mag hydroxide-simeth (MAALOX/MYLANTA) 200-200-20 MG/5ML suspension  Take 15 mLs by mouth every 6 (six) hours as needed for indigestion or heartburn.    Marland Kitchen. amLODipine (NORVASC) 10 MG tablet Take 1 tablet (10 mg total) by mouth daily. 90 tablet 3  . aspirin 81 MG tablet Take 81 mg by mouth daily.      . Azelastine-Fluticasone (DYMISTA) 137-50 MCG/ACT SUSP Place 1 spray into the nose 2 (two) times daily. 23 g 6  . Coenzyme Q10 200 MG capsule Take one daily    . Garlic Oil 1000 MG CAPS Take 1 capsule by mouth daily.      Marland Kitchen. guaiFENesin (MUCINEX) 600 MG 12 hr tablet Take 1,200 mg by mouth 2 (two) times daily as needed for to loosen phlegm. With plenty of fluids    . isosorbide mononitrate (IMDUR) 30 MG 24 hr tablet Take 1 tablet (30 mg total) by mouth daily. 30 tablet 3  . loratadine (CLARITIN) 10 MG tablet Take 10 mg by mouth daily as needed for rhinitis.     . Multiple Vitamins-Minerals (CENTRUM SILVER) tablet Take 1 tablet by mouth daily.      . Omega-3 Fatty Acids (FISH OIL) 1000 MG CAPS Take 1 capsule by mouth daily.      . pantoprazole (PROTONIX) 40 MG tablet Take 1 tablet (40 mg total) by mouth daily. 30 tablet 3  . potassium chloride SA (KLOR-CON M20) 20 MEQ tablet Take 1 tablet (20 mEq total) by mouth daily. 90 tablet 3  . pravastatin (PRAVACHOL) 20 MG tablet Take 1  tablet (20 mg total) by mouth daily. 90 tablet 3  . Tamsulosin HCl (FLOMAX) 0.4 MG CAPS Take 1 capsule (0.4 mg total) by mouth as needed. 90 capsule 3  . telmisartan-hydrochlorothiazide (MICARDIS HCT) 80-25 MG per tablet Take 1 tablet by mouth daily. 90 tablet 3  . metoprolol tartrate (LOPRESSOR) 25 MG tablet Take 0.5 tablets (12.5 mg total) by mouth 2 (two) times daily. 180 tablet 3   No current facility-administered medications for this visit.    Allergies:   No Known Allergies  Social History:  The patient  reports that he has never smoked. He does not have any smokeless tobacco history on file. He reports that he does not drink alcohol or use illicit drugs.   Family history:   Family  History  Problem Relation Age of Onset  . Arthritis    . Hypertension Mother   . Diabetes Mother 13  . Stroke Father 86  . Diabetes Brother     ROS:  Please see the history of present illness.  All other systems reviewed and negative.   PHYSICAL EXAM: VS:  BP 130/70 mmHg  Pulse 94  Ht  (1.626 m)  Wt 179 lb 6.4 oz (81.375 kg)  BMI 30.78 kg/m2 Well nourished, well developed, in no acute distress HEENT: Pupils are equal round react to light accommodation extraocular movements are intact.  Neck: no JVDNo cervical lymphadenopathy. Cardiac: Regular rate and rhythm 2/6 systolic murmur  Lungs:  clear to auscultation bilaterally, no wheezing, rhonchi or rales Abd: soft, nontender, positive bowel sounds all quadrants, no hepatosplenomegaly Ext: 1+ lower extremity edema.  2+ radial and dorsalis pedis pulses. Skin: warm and dry Neuro:  Grossly normal      ASSESSMENT AND PLAN:  Problem List Items Addressed This Visit    Stable angina - Primary    Test post abnormal Cardiolite stress test. Patient be scheduled for left heart catheterization this coming Friday at 7:30. He is on aspirin and Imdur. Will add Lopressor 12.5 mg twice daily. Continue statin.  Risks have been discussed including but are not limited to bleeding, infection, vascular injury, stroke, myocardial infection, arrhythmia, kidney injury, radiation-related injury in the case of prolonged fluoroscopy use, emergency cardiac surgery, and death. The patient understands the risks of serious complication is low (<1%).   Precath labs have been ordered.       Relevant Medications   metoprolol tartrate (LOPRESSOR) 25 MG tablet   HYPERCHOLESTEROLEMIA    Continue statin      Relevant Medications   metoprolol tartrate (LOPRESSOR) 25 MG tablet   Essential hypertension    Blood pressure at the upper limits of controlled Will add 12.5 twice daily Lopressor.      Relevant Medications   metoprolol tartrate (LOPRESSOR) 25 MG  tablet    Other Visit Diagnoses    Pre-procedure lab exam        Relevant Orders    Basic Metabolic Panel (BMET)    CBC with Differential    INR/PT

## 2015-03-18 NOTE — Interval H&P Note (Signed)
Cath Lab Visit (complete for each Cath Lab visit)  Clinical Evaluation Leading to the Procedure:   ACS: No.  Non-ACS:    Anginal Classification: CCS III  Anti-ischemic medical therapy: Minimal Therapy (1 class of medications)  Non-Invasive Test Results: Intermediate-risk stress test findings: cardiac mortality 1-3%/year  Prior CABG: No previous CABG      History and Physical Interval Note:  03/18/2015 7:48 AM  Ronnie Boyd  has presented today for surgery, with the diagnosis of abnormal cardiolight  The various methods of treatment have been discussed with the patient and family. After consideration of risks, benefits and other options for treatment, the patient has consented to  Procedure(s): Left Heart Cath and Coronary Angiography ([name]) as a surgical intervention .  The patient's history has been reviewed, patient examined, no change in status, stable for surgery.  I have reviewed the patient's chart and labs.  Questions were answered to the patient's satisfaction.     Macklen Wilhoite [employer]Lean

## 2015-03-18 NOTE — Discharge Instructions (Signed)
    Instructions for patient:  1. Increase Imdur to 60 mg daily at home (given new prescription).  2. Increase metoprolol to 25 mg bid at home (given new prescription).  3. Take aspirin 81 mg daily.  4. Increase pravastatin to 40 mg daily at home (from 20).  5. Make sure he has CVTS appointment next week prior to discharge.  6. Make sure he knows to come to the ER with any chest pain at rest.     Radial Site Care Refer to this sheet in the next few weeks. These instructions provide you with information on caring for yourself after your procedure. Your caregiver may also give you more specific instructions. Your treatment has been planned according to current medical practices, but problems sometimes occur. Call your caregiver if you have any problems or questions after your procedure. HOME CARE INSTRUCTIONS  You may shower the day after the procedure.Remove the bandage (dressing) and gently wash the site with plain soap and water.Gently pat the site dry.  Do not apply powder or lotion to the site.  Do not submerge the affected site in water for 3 to 5 days.  Inspect the site at least twice daily.  Do not flex or bend the affected arm for 24 hours.  No lifting over 5 pounds (2.3 kg) for 5 days after your procedure.  Do not drive home if you are discharged the same day of the procedure. Have someone else drive you.  You may drive 24 hours after the procedure unless otherwise instructed by your caregiver.  Do not operate machinery or power tools for 24 hours.  A responsible adult should be with you for the first 24 hours after you arrive home. What to expect:  Any bruising will usually fade within 1 to 2 weeks.  Blood that collects in the tissue (hematoma) may be painful to the touch. It should usually decrease in size and tenderness within 1 to 2 weeks. SEEK IMMEDIATE MEDICAL CARE IF:  You have unusual pain at the radial site.  You have redness, warmth, swelling, or pain at  the radial site.  You have drainage (other than a small amount of blood on the dressing).  You have chills.  You have a fever or persistent symptoms for more than 72 hours.  You have a fever and your symptoms suddenly get worse.  Your arm becomes pale, cool, tingly, or numb.  You have heavy bleeding from the site. Hold pressure on the site and call 911. Document Released: 09/29/2010 Document Revised: 11/19/2011 Document Reviewed: 09/29/2010 [facility] Patient Information 2015 [employer] [place]. This information is not intended to replace advice given to you by your health care provider. Make sure you discuss any questions you have with your health care provider.

## 2015-03-21 ENCOUNTER — Institutional Professional Consult (permissible substitution) (INDEPENDENT_AMBULATORY_CARE_PROVIDER_SITE_OTHER): Payer: Medicare Other | Admitting: Thoracic Surgery (Cardiothoracic Vascular Surgery)

## 2015-03-21 ENCOUNTER — Encounter: Payer: Self-pay | Admitting: Thoracic Surgery (Cardiothoracic Vascular Surgery)

## 2015-03-21 VITALS — BP 134/63 | HR 54 | Resp 20 | Ht 65.0 in | Wt 178.0 lb

## 2015-03-21 DIAGNOSIS — I2510 Atherosclerotic heart disease of native coronary artery without angina pectoris: Secondary | ICD-10-CM

## 2015-03-21 DIAGNOSIS — I350 Nonrheumatic aortic (valve) stenosis: Secondary | ICD-10-CM

## 2015-03-21 MED FILL — Nitroglycerin IV Soln 100 MCG/ML in D5W: INTRA_ARTERIAL | Qty: 10 | Status: AC

## 2015-03-21 NOTE — Progress Notes (Signed)
PCP is Rene Paci, MD Referring Provider is Laurey Morale, MD  Chief Complaint  Patient presents with  . Coronary Artery Disease    Surgical eval, Cardiac Cath 03/18/15, ECHO 03/09/15    HPI: 81 yo man with no prior history of CAD presents with a cc/o exertional chest tightness.  Ronnie Boyd is an 81 yo man with a PMH significant for hypertension, hypercholesterolemia, reflux, DJD, and PVD. He also has a history of mild AS (moderate on recent echo). He started having exertional chest tightness about a month or two ago. He would notice this when he was mowing his lawn. It was relieved by rest. There was no radiation, shortness of breath, nausea or diaphoresis associated with the tightness. He has not had any episodes at rest or with minimal exertion.  On 03/01/2015 his family noted his left shoulder and eyelid drooping. The drooping resolved but he was having excessive sleepiness. An MR showed a small acute right medial thalamic/midbrain lacunar infarct. MRA of the brain showed no large vessel stenosis. Carotid ultrasound was done on 03/09/15 and showed no significant extracranial stenosis. A TTE was done. His AS had progressed from mild to moderate. His EF was normal. He saw Dr. Shirlee Latch. A stress test was done. There was T wave inversion during the stress. Perfusion showed a medium defect of moderate severity present in the mid -distal anterolateral, apical lateral and apex. This was an intermediate risk study. 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  He saw Dr. Pearlean Brownie re: his CVA. His deficits had resolved. He added Plavix.    Past Medical History  Diagnosis Date  . Allergic rhinitis   . Hypertension   . PVD (peripheral vascular disease)   . Hypercholesteremia   . Diabetes mellitus     borderline  . GERD (gastroesophageal reflux disease)   . Hemorrhoids   . Prostatitis     hx of  . Degenerative joint disease   . Aortic stenosis     mild echo 7/14  . Acid reflux      Past Surgical History  Procedure Laterality Date  . Carpal tunnel release      bilateral  . Hemorrhoid surgery  1989  . Cardiac catheterization [name] 03/18/2015    Procedure: Left Heart Cath and Coronary Angiography;  Surgeon: Laurey Morale, MD;  Location: [facility] INVASIVE CV LAB;  Service: Cardiovascular;  Laterality: [name];    Family History  Problem Relation Age of Onset  . Arthritis    . Hypertension Mother   . Diabetes Mother 1  . Stroke Father 68  . Diabetes Brother     Social History History  Substance Use Topics  . Smoking status: Never Smoker   . Smokeless tobacco: Not on file  . Alcohol Use: No    Current Outpatient Prescriptions  Medication Sig Dispense Refill  . alum & mag hydroxide-simeth (MAALOX/MYLANTA) 200-200-20 MG/5ML suspension Take 15 mLs by mouth every 6 (six) hours as needed for indigestion or heartburn.    Marland Kitchen amLODipine (NORVASC) 10 MG tablet Take 1 tablet (10 mg total) by mouth daily. 90 tablet 3  . aspirin EC 81 MG tablet Take 81 mg by mouth daily.    . Azelastine-Fluticasone (DYMISTA) 137-50 MCG/ACT SUSP Place 1 spray into the nose 2 (two) times daily. (Patient taking differently: Place 1 spray into the nose as needed. ) 23 g 6  . clopidogrel (PLAVIX) 75 MG tablet Take 1 tablet (75 mg total) by mouth daily. 30 tablet 11  .  Coenzyme Q10 200 MG capsule Take 200 mg by mouth daily. Take one daily    . Garlic Oil 1000 MG CAPS Take 1 capsule by mouth daily.      Marland Kitchen. guaiFENesin (MUCINEX) 600 MG 12 hr tablet Take 1,200 mg by mouth 2 (two) times daily as needed for to loosen phlegm. With plenty of fluids    . isosorbide mononitrate (IMDUR) 60 MG 24 hr tablet Take 60 mg by mouth daily.    Marland Kitchen. loratadine (CLARITIN) 10 MG tablet Take 10 mg by mouth daily as needed for rhinitis.     . metoprolol tartrate (LOPRESSOR) 25 MG tablet Take 25 mg by mouth 2 (two) times daily.    . Multiple Vitamins-Minerals (CENTRUM SILVER) tablet Take 1 tablet by mouth daily.      . Omega-3  Fatty Acids (FISH OIL) 1000 MG CAPS Take 1 capsule by mouth daily.      . pantoprazole (PROTONIX) 40 MG tablet Take 1 tablet (40 mg total) by mouth daily. 30 tablet 3  . potassium chloride SA (KLOR-CON M20) 20 MEQ tablet Take 1 tablet (20 mEq total) by mouth daily. 90 tablet 3  . pravastatin (PRAVACHOL) 20 MG tablet Take 1 tablet (20 mg total) by mouth daily. (Patient taking differently: Take 40 mg by mouth daily. ) 90 tablet 3  . Tamsulosin HCl (FLOMAX) 0.4 MG CAPS Take 1 capsule (0.4 mg total) by mouth as needed. (Patient taking differently: Take 0.4 mg by mouth daily. ) 90 capsule 3  . telmisartan-hydrochlorothiazide (MICARDIS HCT) 80-25 MG per tablet Take 1 tablet by mouth daily. 90 tablet 3   No current facility-administered medications for this visit.    No Known Allergies  Review of Systems  Constitutional: Positive for activity change. Negative for fever, chills, diaphoresis and unexpected weight change.  Cardiovascular: Positive for chest pain. Negative for palpitations and leg swelling.  Gastrointestinal: Negative for diarrhea, constipation and blood in stool.       Reflux   Endocrine: Negative.  Negative for polydipsia and polyuria.  Genitourinary: Positive for urgency and frequency. Negative for hematuria.       Reflux  Musculoskeletal: Positive for arthralgias.       Pain in feet with walking.   Neurological: Positive for dizziness, facial asymmetry (transient, resolved), weakness and headaches.  Hematological: Negative for adenopathy. Does not bruise/bleed easily.  All other systems reviewed and are negative.   BP 134/63 mmHg  Pulse 54  Resp 20  Ht 5\' 5"  (1.651 m)  Wt 178 lb (80.74 kg)  BMI 29.62 kg/m2  SpO2 97% Physical Exam  Constitutional: He is oriented to person, place, and time. He appears well-developed and well-nourished. No distress.  HENT:  Head: Normocephalic and atraumatic.  Eyes: Conjunctivae and EOM are normal. Pupils are equal, round, and reactive  to light. No scleral icterus.  Neck: Normal range of motion. Neck supple. No thyromegaly present.  Transmitted murmur bilaterally  Cardiovascular: Normal rate and regular rhythm.   Murmur (2/6 crescendo/ decrescendo) heard. Unable to palpate DP or PT bilaterally  Pulmonary/Chest: Effort normal and breath sounds normal. He has no wheezes. He has no rales.  Abdominal: Soft. Bowel sounds are normal. He exhibits no distension. There is no tenderness.  Musculoskeletal: Normal range of motion. He exhibits no edema.  Lymphadenopathy:    He has no cervical adenopathy.  Neurological: He is alert and oriented to person, place, and time. No cranial nerve deficit. Coordination normal.  Motor 5/5 bilateral UE and LE  Skin: Skin is warm and dry.  Psychiatric: He has a normal mood and affect.  Vitals reviewed.    Diagnostic Tests: CARDIAC CATH Coronary Findings    Dominance: Left   Left Main  Minimal disease.     Left Anterior Descending  60% calcified proximal LAD stenosis, 80% calcified mid LAD stenosis, 80-90% distal LAD stenosis.     Left Circumflex  Large LCx with a large high OM/ramus. There is a probable 70+% stenosis in the proximal LCx just after the take-off of the high OM. High OM is a large vessel with 90% proximal stenosis and diffuse severe distal disease.     Right Coronary Artery  Small, nondominant vessel. 80% ostial stenosis, 95% proximal stenosis, 90% mid stenosis. Diffuse distal vessel disease.     ECHO Study Conclusions  - Left ventricle: The cavity size was normal. There was mild concentric hypertrophy. Systolic function was vigorous. The estimated ejection fraction was in the range of 65% to 70%. Wall motion was normal; there were no regional wall motion abnormalities. - Aortic valve: There was moderate stenosis. There was mild regurgitation. Valve area (VTI): 1.12 cm^2. Valve area (Vmax): 1.12 cm^2. Valve area (Vmean): 0.98 cm^2. While there may  be a component of subvalvular obstruction, most of the outflow obstruction appears to occur at valve level.      I personally reviewed the cath films and echocardiogram.   Impression: 81 yo man with multiple cardiac risk factors but good overall health who presented with a transient neurologic deficit. He had a small CVA by MR. He also c/o chest tightness with exertion. Work up revealed 3 vessel CAD not favorable for PCI. He also has moderate AS.  CABG is indicated for survival benefit and relief of symptoms. However, timing is tricky due to acute CVA 3 weeks ago. Fortunately there is no residual deficit. We will have to check with Dr. Pearlean Brownie as to the timing of elective CABG. He is currently only having symptoms with moderate to heavy activity, so I do not think urgent revascularization is advisable in the setting of a recent CVA.  My recommendation was to wait until 6 weeks from the CVA to proceed, but again we will have to discuss that with Dr. Pearlean Brownie.  I discussed the general nature of the procedure, the need for general anesthesia, the use of cardiopulmonary bypass, and the incisions to be used with Ronnie Boyd and his family. I discussed the expected hospital stay, overall recovery and short and long term outcomes. I reviewed the indications, risks, benefits and alternatives. They understand the risks include, but are not limited to death, stroke, MI, DVT/PE, bleeding, possible need for transfusion, infections,other organ system dysfunction including respiratory, renal, or GI complications.   He accepts the risks and agrees to proceed.  I do not think he needs an AVR and that would increase the time and complexity of teh operation. If his AS progresses a TAVR would be an option down the road.  Plan: CABG once clear from a neuro standpoint (tentaively scheduled for 04/18/2015) Stop Plavix 5 days prior to surgery.  I spent 45 minutes face to face with Ronnie Boyd and his family obtaining  history, doing physical, reviewing cath films and in discussion (>50%). Loreli Slot, MD [facility] [phone]

## 2015-03-22 ENCOUNTER — Other Ambulatory Visit: Payer: Self-pay

## 2015-03-22 DIAGNOSIS — I2510 Atherosclerotic heart disease of native coronary artery without angina pectoris: Secondary | ICD-10-CM

## 2015-03-23 ENCOUNTER — Other Ambulatory Visit: Payer: Self-pay | Admitting: *Deleted

## 2015-03-24 ENCOUNTER — Encounter: Payer: Self-pay | Admitting: Neurology

## 2015-03-24 ENCOUNTER — Ambulatory Visit (INDEPENDENT_AMBULATORY_CARE_PROVIDER_SITE_OTHER): Payer: Medicare Other | Admitting: Neurology

## 2015-03-24 VITALS — BP 122/64 | HR 62 | Resp 20 | Ht 64.0 in | Wt 179.0 lb

## 2015-03-24 DIAGNOSIS — I6381 Other cerebral infarction due to occlusion or stenosis of small artery: Secondary | ICD-10-CM

## 2015-03-24 DIAGNOSIS — G4730 Sleep apnea, unspecified: Secondary | ICD-10-CM | POA: Diagnosis not present

## 2015-03-24 DIAGNOSIS — R0683 Snoring: Secondary | ICD-10-CM

## 2015-03-24 DIAGNOSIS — I63211 Cerebral infarction due to unspecified occlusion or stenosis of right vertebral arteries: Secondary | ICD-10-CM | POA: Diagnosis not present

## 2015-03-24 DIAGNOSIS — I6322 Cerebral infarction due to unspecified occlusion or stenosis of basilar arteries: Secondary | ICD-10-CM

## 2015-03-24 DIAGNOSIS — G4710 Hypersomnia, unspecified: Secondary | ICD-10-CM

## 2015-03-24 DIAGNOSIS — Z8673 Personal history of transient ischemic attack (TIA), and cerebral infarction without residual deficits: Secondary | ICD-10-CM | POA: Insufficient documentation

## 2015-03-24 NOTE — Progress Notes (Signed)
S[facility]EP [facility]INE CL[facility]IC   Provider:  Melvyn Novas, M D  Referring Provider: Newt Lukes, MD Primary Care Physician:  Rene Paci, MD  Chief Complaint  Patient presents with  . sleep consult    rm 11, with wife and daughter,, snoring    HPI:  Ronnie Boyd is a 81 y.o. male  Is seen here as a referral  from Ronnie Boyd and Ronnie Boyd for a sleep evaluation.  Ronnie Boyd was so during his hospitalization seen by my colleague Ronnie Boyd. He had presented with a recent stroke that affected the thalamic region. One of his symptoms was leaning towards the left and weaker side. Doctors said he had added a baby aspirin to Plavix 75 mg daily , and had explained the goals of control of hypertension blood pressure and cholesterol. The patient was advised that she should take a regular exercise, moderate diet.  The patient noted that he likes to walk but that he developed cramps in his thighs and both lower extremities which he attributes to his cholesterol-lowering medication.  Change from Lipitor  to Pravachol recently. They' caused him to have  myositis symptoms.  I "from Ronnie Boyd dated 03-17-15 that the patient was noted by family to develop excessive sleepiness a few weeks ago the symptoms lasted for several days before he saw Ronnie Boyd also noticed that he was slightly off balance not really identifying a fall risk. The wife noted his right eyelid to be droopy but there was no vision impairment. The daughter notices this way. Headaches or speech difficulties were denied. His symptoms had improved and he was also not quite as sleepy anymore and walking better when he was finally receiving an outpatient MRI scan of the brain on 6-20 4-16 which showed a small acute right medial thalamic and midbrain infarct. This was measured to be a local. The MRI of the brain showed no large vessel stenosis and carotid ultrasound from 6-20 9-16 reviewed by Ronnie Boyd showed no significant  stenosis in the extracranial parts. He had a transthoracic cardiac echo done which was normal. Nuclear medicine scan done for chest pain and shortness of breath was Normal and he is therefore scheduled to undergo cardiac catheterization with Ronnie Boyd.  The patient reports that he has no trouble falling asleep when watching TV in his living room. He is has an easier time sleeping in a seated position or reclined position. When he transfers to the bedroom is not as easy for him to find a comfortable sleep position and to go to sleep. He describes his marital bedroom is cool, quiet and dark. He sleeps on his back on multiple pillows . The patient has to go to the bathroom at least 2-3 times at night and he is taking diabetic medications. Usually can reinitiate sleep quickly after bathroom break. He rises in the morning at about 6:30 AM spontaneously without alarm. He does not describe any morning headaches, he wakes up with a dry mouth. His wife states usually in bed he often returns to the bedroom a nap for another hour. Does not drink any caffeinated beverages. 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  He rarely takes other naps, after lunch, 1 hour . He can easily fall asleep in daytime. He takes an after dinner nap daily. Total sleep time is about 8 hours.   He no history of ENT, neck of facial surgery.  Denies falling asleep driving.  He feels back to baseline,    Review of Systems: Out of a complete 14 system review, the patient complains of only the following symptoms, and all other reviewed systems are negative.    Epworth score 11 , Fatigue severity score 21  , depression score 1 points    History   Social History  . Marital Status: Married    Spouse Name: Ronnie Boyd  . Number of Children: Ronnie Boyd  . Years of Education: Ronnie Boyd   Occupational History  . retired    Social History Main Topics  . Smoking status: Never Smoker   . Smokeless  tobacco: Not on file  . Alcohol Use: No  . Drug Use: No  . Sexual Activity: Not on file   Other Topics Concern  . Not on file   Social History Narrative    Family History  Problem Relation Age of Onset  . Arthritis    . Hypertension Mother   . Diabetes Mother 27  . Stroke Father 84  . Diabetes Brother     Past Medical History  Diagnosis Date  . Allergic rhinitis   . Hypertension   . PVD (peripheral vascular disease)   . Hypercholesteremia   . Diabetes mellitus     borderline  . GERD (gastroesophageal reflux disease)   . Hemorrhoids   . Prostatitis     hx of  . Degenerative joint disease   . Aortic stenosis     mild echo 7/14  . Acid reflux     Past Surgical History  Procedure Laterality Date  . Carpal tunnel release      bilateral  . Hemorrhoid surgery  1989  . Cardiac catheterization Ronnie Boyd 03/18/2015    Procedure: Left Heart Cath and Coronary Angiography;  Surgeon: Ronnie Morale, MD;  Location: [facility] INVASIVE CV LAB;  Service: Cardiovascular;  Laterality: Ronnie Boyd;    Current Outpatient Prescriptions  Medication Sig Dispense Refill  . alum & mag hydroxide-simeth (MAALOX/MYLANTA) 200-200-20 MG/5ML suspension Take 15 mLs by mouth every 6 (six) hours as needed for indigestion or heartburn.    Marland Kitchen amLODipine (NORVASC) 10 MG tablet Take 1 tablet (10 mg total) by mouth daily. 90 tablet 3  . aspirin EC 81 MG tablet Take 81 mg by mouth daily.    . Azelastine-Fluticasone (DYMISTA) 137-50 MCG/ACT SUSP Place 1 spray into the nose 2 (two) times daily. (Patient taking differently: Place 1 spray into the nose as needed. ) 23 g 6  . clopidogrel (PLAVIX) 75 MG tablet Take 1 tablet (75 mg total) by mouth daily. 30 tablet 11  . Coenzyme Q10 200 MG capsule Take 200 mg by mouth daily. Take one daily    . Garlic Oil 1000 MG CAPS Take 1 capsule by mouth daily.      Marland Kitchen guaiFENesin (MUCINEX) 600 MG 12 hr tablet Take 1,200 mg by mouth 2 (two) times daily as needed for to loosen phlegm. With plenty  of fluids    . isosorbide mononitrate (IMDUR) 60 MG 24 hr tablet Take 60 mg by mouth daily.    Marland Kitchen loratadine (CLARITIN) 10 MG tablet Take 10 mg by mouth daily as needed for rhinitis.     . metoprolol tartrate (LOPRESSOR) 25 MG tablet Take 25 mg by mouth 2 (two) times daily.    . Multiple Vitamins-Minerals (CENTRUM SILVER) tablet Take 1 tablet by mouth daily.      . Omega-3 Fatty Acids (FISH OIL) 1000 MG CAPS Take 1 capsule  by mouth daily.      . pantoprazole (PROTONIX) 40 MG tablet Take 1 tablet (40 mg total) by mouth daily. 30 tablet 3  . potassium chloride SA (KLOR-CON M20) 20 MEQ tablet Take 1 tablet (20 mEq total) by mouth daily. 90 tablet 3  . pravastatin (PRAVACHOL) 20 MG tablet Take 1 tablet (20 mg total) by mouth daily. (Patient taking differently: Take 40 mg by mouth daily. ) 90 tablet 3  . Tamsulosin HCl (FLOMAX) 0.4 MG CAPS Take 1 capsule (0.4 mg total) by mouth as needed. (Patient taking differently: Take 0.4 mg by mouth daily. ) 90 capsule 3  . telmisartan-hydrochlorothiazide (MICARDIS HCT) 80-25 MG per tablet Take 1 tablet by mouth daily. 90 tablet 3   No current facility-administered medications for this visit.    Allergies as of 03/24/2015  . (No Known Allergies)    Vitals: BP 122/64 mmHg  Pulse 62  Resp 20  Ht  (1.626 m)  Wt 179 lb (81.194 kg)  BMI 30.71 kg/m2 Last Weight:  Wt Readings from Last 1 Encounters:  03/24/15 179 lb (81.194 kg)       Last Height:   Ht Readings from Last 1 Encounters:  03/24/15  (1.626 m)    Physical exam:  General: The patient is awake, alert and appears not in acute distress. The patient is well groomed. Head: Normocephalic, atraumatic. Neck is supple. Mallampati 4  neck circumference:18. Nasal airflow unrestricted , TMJ is  Not eevident . Retrognathia is seen.  Cardiovascular:  Regular rate and rhythm , without  murmurs or carotid bruit, and without distended neck veins. Respiratory: Lungs are clear to auscultation. Skin:   Without evidence of edema, or rash Trunk: BMI is  elevated and patient  has normal posture.  Neurologic exam : The patient is awake and alert, oriented to place and time.   Memory subjective  described as intact. There is a normal attention span & concentration ability. Speech is fluent without  dysarthria, dysphonia or aphasia. Mood and affect are appropriate.  Cranial nerves: Pupils are equal and briskly reactive to light.  Funduscopic exam without  evidence of pallor or edema. Extraocular movements  in vertical and horizontal planes intact and without nystagmus. Visual fields by finger perimetry are intact. Hearing to finger rub intact.  Facial sensation intact to fine touch. Facial motor strength is symmetric and tongue and uvula move midline.  Motor exam:  Normal tone, muscle bulk and symmetric ,strength in all extremities.  Sensory:  Fine touch, pinprick and vibration were tested in all extremities.  Proprioception is tested in the upper extremities -normal.  Coordination: Rapid alternating movements in the fingers/hands is normal.  Finger-to-nose maneuver  normal without evidence of ataxia, dysmetria or tremor.  Gait and station: Patient walks without assistive device and is able unassisted to climb up to the exam table.  Strength within normal limits. Stance is stable and normal. Tandem gait is unfragmented. Romberg testing is negative.  Deep tendon reflexes: in the  upper and lower extremities are symmetric and intact. Babinski maneuver response is downgoing.   Assessment:  After physical and neurologic examination, review of laboratory studies, imaging, neurophysiology testing and pre-existing records, assessment is   Patient was recently documented coronary artery disease status post heart catheterization on 03-18-15. Prepared to undergo bypass surgery next months August 2016. Complains of a mild level of fatigue and a moderate level of daytime sleepiness much improved in  comparison to the initial symptoms when he  was diagnosed with a stroke. His stroke affected the right thalamic region of the brain which would be modulating sleep spindles, controlling sensory input from the left body, and is also known to control wakefulness and sleep pattern. I think that his transient sleepiness is very likely related to the stroke. He has risk factors for obstructive sleep apnea and therefore will undergo an attended split-night polysomnography. I would like to have the study done before he has his bypass surgery. He may also benefit to be on positive airway pressure therapy if needed prior to the cardiac surgery.   The patient was advised of the nature of the diagnosed sleep disorder , the treatment options and risks for general a health and wellness arising from not treating the condition. Visit duration was 40 minutes.   Plan:  Treatment plan and additional workup :  SPLIT study ,  No CO2 needed, has CAD and is expected to undergo bypass surgery.     Porfirio Mylararmen Theone Bowell MD  03/24/2015

## 2015-03-24 NOTE — Patient Instructions (Signed)
Ischemic Stroke A stroke (cerebrovascular accident) is the sudden death of brain tissue. It is a medical emergency. A stroke can cause permanent loss of brain function. This can cause problems with different parts of your body. A transient ischemic attack (TIA) is different because it does not cause permanent damage. A TIA is a short-lived problem of poor blood flow affecting a part of the brain. A TIA is also a serious problem because having a TIA greatly increases the chances of having a stroke. When symptoms first develop, you cannot know if the problem might be a stroke or a TIA. CAUSES  A stroke is caused by a decrease of oxygen supply to an area of your brain. It is usually the result of a small blood clot or collection of cholesterol or fat (plaque) that blocks blood flow in the brain. A stroke can also be caused by blocked or damaged carotid arteries.  RISK FACTORS  High blood pressure (hypertension).  High cholesterol.  Diabetes mellitus.  Heart disease.  The buildup of plaque in the blood vessels (peripheral artery disease or atherosclerosis).  The buildup of plaque in the blood vessels providing blood and oxygen to the brain (carotid artery stenosis).  An abnormal heart rhythm (atrial fibrillation).  Obesity.  Smoking.  Taking oral contraceptives (especially in combination with smoking).  Physical inactivity.  A diet high in fats, salt (sodium), and calories.  Alcohol use.  Use of illegal drugs (especially cocaine and methamphetamine).  Being African American.  Being over the age of 39.  Family history of stroke.  Previous history of blood clots, stroke, TIA, or heart attack.  Sickle cell disease. SYMPTOMS  These symptoms usually develop suddenly, or may be newly present upon awakening from sleep:  Sudden weakness or numbness of the face, arm, or leg, especially on one side of the body.  Sudden trouble walking or difficulty moving arms or legs.  Sudden  confusion.  Sudden personality changes.  Trouble speaking (aphasia) or understanding.  Difficulty swallowing.  Sudden trouble seeing in one or both eyes.  Double vision.  Dizziness.  Loss of balance or coordination.  Sudden severe headache with no known cause.  Trouble reading or writing. DIAGNOSIS  Your health care provider can often determine the presence or absence of a stroke based on your symptoms, history, and physical exam. Computed tomography (CT) of the brain is usually performed to confirm the stroke, determine causes, and determine stroke severity. Other tests may be done to find the cause of the stroke. These tests may include:  Electrocardiography.  Continuous heart monitoring.  Echocardiography.  Carotid ultrasonography.  Magnetic resonance imaging (MRI).  A scan of the brain circulation.  Blood tests. PREVENTION  The risk of a stroke can be decreased by appropriately treating high blood pressure, high cholesterol, diabetes, heart disease, and obesity and by quitting smoking, limiting alcohol, and staying physically active. TREATMENT  Time is of the essence. It is important to seek treatment at the first sign of these symptoms because you may receive a medicine to dissolve the clot (thrombolytic) that cannot be given if too much time has passed since your symptoms began. Even if you do not know when your symptoms began, get treatment as soon as possible as there are other treatment options available including oxygen, intravenous (IV) fluids, and medicines to thin the blood (anticoagulants). Treatment of stroke depends on the duration, severity, and cause of your symptoms. Medicines and dietary changes may be used to address diabetes, high blood  pressure, and other risk factors. Physical, speech, and occupational therapists will assess you and work with you to improve any functions impaired by the stroke. Measures will be taken to prevent short-term and long-term  complications, including infection from breathing foreign material into the lungs (aspiration pneumonia), blood clots in the legs, bedsores, and falls. Rarely, surgery may be needed to remove large blood clots or to open up blocked arteries. HOME CARE INSTRUCTIONS   Take medicines only as directed by your health care provider. Follow the directions carefully. Medicines may be used to control risk factors for a stroke. Be sure you understand all your medicine instructions.  You may be told to take a medicine to thin the blood, such as aspirin or the anticoagulant warfarin. Warfarin needs to be taken exactly as instructed.  Too much and too little warfarin are both dangerous. Too much warfarin increases the risk of bleeding. Too little warfarin continues to allow the risk for blood clots. While taking warfarin, you will need to have regular blood tests to measure your blood clotting time. These blood tests usually include both the PT and INR tests. The PT and INR results allow your health care provider to adjust your dose of warfarin. The dose can change for many reasons. It is critically important that you take warfarin exactly as prescribed, and that you have your PT and INR levels drawn exactly as directed.  Many foods, especially foods high in vitamin K, can interfere with warfarin and affect the PT and INR results. Foods high in vitamin K include spinach, kale, broccoli, cabbage, collard and turnip greens, brussels sprouts, peas, cauliflower, seaweed, and parsley, as well as beef and pork liver, green tea, and soybean oil. You should eat a consistent amount of foods high in vitamin K. Avoid major changes in your diet, or notify your health care provider before changing your diet. Arrange a visit with a dietitian to answer your questions.  Many medicines can interfere with warfarin and affect the PT and INR results. You must tell your health care provider about any and all medicines you take. This  includes all vitamins and supplements. Be especially cautious with aspirin and anti-inflammatory medicines. Do not take or discontinue any prescribed or over-the-counter medicine except on the advice of your health care provider or pharmacist.  Warfarin can have side effects, such as excessive bruising or bleeding. You will need to hold pressure over cuts for longer than usual. Your health care provider or pharmacist will discuss other potential side effects.  Avoid sports or activities that may cause injury or bleeding.  Be mindful when shaving, flossing your teeth, or handling sharp objects.  Alcohol can change the body's ability to handle warfarin. It is best to avoid alcoholic drinks or consume only very small amounts while taking warfarin. Notify your health care provider if you change your alcohol intake.  Notify your dentist or other health care providers before procedures.  If swallow studies have determined that your swallowing reflex is present, you should eat healthy foods. Including 5 or more servings of fruits and vegetables a day may reduce the risk of stroke. Foods may need to be a certain consistency (soft or pureed), or small bites may need to be taken in order to avoid aspirating or choking. Certain dietary changes may be advised to address high blood pressure, high cholesterol, diabetes, or obesity.  Food choices that are low in sodium, saturated fat, trans fat, and cholesterol are recommended to manage high blood pressure.  Food choies that are high in fiber, and low in saturated fat, trans fat, and cholesterol may control cholesterol levels.  Controlling carbohydrates and sugar intake is recommended to manage diabetes.  Reducing calorie intake and making food choices that are low in sodium, saturated fat, trans fat, and cholesterol are recommended to manage obesity.  Maintain a healthy weight.  Stay physically active. It is recommended that you get at least 30 minutes of  activity on all or most days.  Do not use any tobacco products including cigarettes, chewing tobacco, or electronic cigarettes.  Limit alcohol use even if you are not taking warfarin. Moderate alcohol use is considered to be:  No more than 2 drinks each day for men.  No more than 1 drink each day for nonpregnant women.  Home safety. A safe home environment is important to reduce the risk of falls. Your health care provider may arrange for specialists to evaluate your home. Having grab bars in the bedroom and bathroom is often important. Your health care provider may arrange for equipment to be used at home, such as raised toilets and a seat for the shower.  Physical, occupational, and speech therapy. Ongoing therapy may be needed to maximize your recovery after a stroke. If you have been advised to use a walker or a cane, use it at all times. Be sure to keep your therapy appointments.  Follow all instructions for follow-up with your health care provider. This is very important. This includes any referrals, physical therapy, rehabilitation, and lab tests. Proper follow-up can prevent another stroke from occurring. SEEK MEDICAL CARE IF:  You have personality changes.  You have difficulty swallowing.  You are seeing double.  You have dizziness.  You have a fever.  You have skin breakdown. SEEK IMMEDIATE MEDICAL CARE IF:  Any of these symptoms may represent a serious problem that is an emergency. Do not wait to see if the symptoms will go away. Get medical help right away. Call your local emergency services (911 in [place] Do not drive yourself to the hospital.  You have sudden weakness or numbness of the face, arm, or leg, especially on one side of the body.  You have sudden trouble walking or difficulty moving arms or legs.  You have sudden confusion.  You have trouble speaking (aphasia) or understanding.  You have sudden trouble seeing in one or both eyes.  You have a loss of  balance or coordination.  You have a sudden, severe headache with no known cause.  You have new chest pain or an irregular heartbeat.  You have a partial or total loss of consciousness. Document Released: 08/27/2005 Document Revised: 01/11/2014 Document Reviewed: 04/06/2012 [facility] Patient Information 2015 [place], [place]. This information is not intended to replace advice given to you by your health care provider. Make sure you discuss any questions you have with your health care provider. Heart Disease Prevention Heart disease can lead to heart attacks and strokes. This is a leading cause of death. Heart disease can be inherited and can be caused from the lifestyle you lead. You can do a lot to keep your heart and blood vessels healthy.  WHAT SHOULD I DO EACH DAY TO KEEP MY HEART HEALTHY?  Do not smoke.  Follow a healthy eating plan as recommended by your caregiver or dietitian.  Be active for a total of 30 minutes most days. Ask your caregiver what activities are best for you.  Limit the amount of alcohol you drink.  Involve  family and friends to help you with a healthy lifestyle. HOW DOES HEART DISEASE CAUSE HIGH BLOOD PRESSURE?  Narrowed blood vessels leave a smaller opening for blood to flow through. It is like turning on a garden hose and holding your thumb over the opening. The smaller opening makes the water shoot out with more pressure. In the same way, narrowed blood vessels can lead to high blood pressure. Other factors, such as kidney problems and being overweight, also can lead to high blood pressure.  If you have high blood pressure you may need to take blood pressure medicine every day. Some types of blood pressure medicine can also help keep your kidneys healthy.  Many people with diabetes also have high blood pressure. If you have heart, eye, or kidney problems from diabetes, high blood pressure can make them worse. HOW DO MY BLOOD VESSELS GET CLOGGED?  Cholesterol  is a substance that is made by the body and used for many important functions. It is also found in food that comes from animals. When your cholesterol is high, it can stick to the insides of your blood vessels, making them narrowed and even clogged. This problem is called atherosclerosis.  Narrowed and clogged blood vessels make it harder for blood to get to important body organs. This can cause problems such as:  Chest pain (angina). Angina can cause temporary pain in your chest, arms, shoulders, or back. You may feel the pain more when your heart beats faster, such as when you exercise. The pain may go away when you rest. You also may feel very weak and sweaty.  A heart attack. A heart attack happens when a blood vessel in or near the heart becomes blocked. Not enough blood is getting to the heart. During a heart attack, you may have chest pain in your chest, arms, shoulders, or back along with nausea, indigestion, extreme weakness, and sweating. WHAT CAN I DO TO PREVENT HEART DISEASE?   Keep your blood pressure under control as recommended by your caregiver.  Keep your cholesterol under control. Have it checked at least once a year. Target cholesterol levels for most people are:  Total blood cholesterol level: Below 200.  LDL (bad) cholesterol: Below 100.  HDL (good) cholesterol: Above 40 in men and above 50 in women.  Triglycerides (another type of fat in the blood): Below 150.  Make physical activity a part of your daily routine. Check with your caregiver to learn what activities are best for you.  Make sure that the foods you eat are "heart-healthy."  Include foods high in fiber, such as oat bran, oatmeal, whole-grain breads and cereals.  Cut back on fried foods and foods high in saturated fat. This includes foods such as meats, butter, whole dairy products, shortening, and coconut or palm oil.  Avoid salty foods such as canned food, luncheon meat, salty snacks, and fast  food.  Eat more fruits and vegetables.  Drink less alcohol.  Lose weight as recommended by your caregiver.  If you smoke, quit. Your caregiver can help you with quitting options.  Ask your caregiver whether you should take a daily aspirin. Studies have shown that taking aspirin can help reduce your risk of heart disease and stroke.  Take your prescribed medicines as directed. WHAT ARE THE WARNING SIGNS OF A HEART ATTACK? You may have one or more of the following warning signs:  Chest pain or discomfort.  Pain or discomfort in your arms, back, jaw, or neck.  Indigestion or  stomach pain.  Shortness of breath.  Sweating.  Nausea or vomiting.  Lightheadedness.  No warning signs at all or they may come and go. FOR MORE INFORMATION  To find out more about heart disease and stroke prevention, visit the American Heart Association website at www.americanheart.org Document Released: 04/10/2004 Document Revised: 02/26/2012 Document Reviewed: 10/21/2013 [facility] Patient Information 2015 [employer], [place]. This information is not intended to replace advice given to you by your health care provider. Make sure you discuss any questions you have with your health care provider.

## 2015-04-01 ENCOUNTER — Ambulatory Visit (INDEPENDENT_AMBULATORY_CARE_PROVIDER_SITE_OTHER): Payer: Medicare Other | Admitting: Internal Medicine

## 2015-04-01 ENCOUNTER — Encounter: Payer: Self-pay | Admitting: Internal Medicine

## 2015-04-01 VITALS — BP 114/58 | HR 45 | Temp 98.0°F | Resp 16 | Wt 176.0 lb

## 2015-04-01 DIAGNOSIS — I10 Essential (primary) hypertension: Secondary | ICD-10-CM

## 2015-04-01 DIAGNOSIS — I639 Cerebral infarction, unspecified: Secondary | ICD-10-CM | POA: Diagnosis not present

## 2015-04-01 DIAGNOSIS — E7800 Pure hypercholesterolemia, unspecified: Secondary | ICD-10-CM

## 2015-04-01 DIAGNOSIS — R739 Hyperglycemia, unspecified: Secondary | ICD-10-CM

## 2015-04-01 DIAGNOSIS — I350 Nonrheumatic aortic (valve) stenosis: Secondary | ICD-10-CM

## 2015-04-01 DIAGNOSIS — I25119 Atherosclerotic heart disease of native coronary artery with unspecified angina pectoris: Secondary | ICD-10-CM

## 2015-04-01 DIAGNOSIS — I6381 Other cerebral infarction due to occlusion or stenosis of small artery: Secondary | ICD-10-CM

## 2015-04-01 NOTE — Patient Instructions (Signed)
Minimal Blood Pressure Goal= AVERAGE < 140/90;  Ideal is an AVERAGE < 135/85. This AVERAGE should be calculated from @ least 5-7 BP readings taken @ different times of day on different days of week. You should not respond to isolated BP readings , but rather the AVERAGE for that week .Please bring your  blood pressure cuff to office visits to verify that it is reliable.It  can also be checked against the blood pressure device at the pharmacy. Finger or wrist cuffs are not dependable; an arm cuff is. With the lower dose of Micardis/HCT; you may be able to stop the potassium supplement.

## 2015-04-01 NOTE — Progress Notes (Signed)
   Subjective:    Patient ID: Ronnie Boyd, male    DOB: 12-22-1926, 81 y.o.   MRN: [id number]  HPI He is on 81 mg ASA & Plavix for thalamic stroke. The presentation was non emergent as somnulence and imbalance.He is here for follow-up of his hypertension.  He denies any active neurologic, cardiopulmonary, or bleeding dyscrasia symptoms.  He has not been monitoring his blood pressure. He is on a heart healthy diet. He has been compliant with his medicines without adverse effects although he does demonstrate bradycardia on low dose B blocker. He believes he was told that he was placed on medicine to slow his heart rate.The cath note states this was for angina prophylaxis. He previously been very physically active in the garden; but he was told to restrict this due to angina.   Review of Systems  Chest pain, palpitations, tachycardia, exertional dyspnea, paroxysmal nocturnal dyspnea, claudication or edema are absent. Fever, chills, sweats, or unexplained weight loss not present. No significant headaches. Mental status change or memory loss denied. Blurred vision , diplopia or vision loss absent. Vertigo, near syncope or imbalance denied. There is no numbness, tingling, or weakness in extremities.   No loss of control of bladder or bowels. Radicular type pain absent. No seizure stigmata. Epistaxis, hemoptysis, hematuria, melena, or rectal bleeding denied. No unexplained weight loss, significant dyspepsia,dysphagia, or abdominal pain.  There is no abnormal bruising , bleeding, or difficulty stopping bleeding with injury.      Objective:   Physical Exam   Pertinent or positive findings include: Pattern alopecia is present. He has asymmetric ptosis, greater on the left. He has a very small goatee. He has an aortic stenosis grade 1.5-2 systolic murmur with carotid radiation bilaterally. Breath sounds are decreased. Pedal pulses are slightly decreased. There is crepitus in the knees  greater on the left than the right. He has isolated flexion contractures of his hands.  General appearance :adequately nourished; in no distress. BMI 30.2  Eyes: No conjunctival inflammation or scleral icterus is present.  Oral exam:  Lips and gums are healthy appearing.There is no oropharyngeal erythema or exudate noted.  Heart:  Slow rate and regular rhythm. S1 and S2 normal without gallop, click, rub or other extra sounds    Lungs:Chest clear to auscultation; no wheezes, rhonchi,rales ,or rubs present.No increased work of breathing.   Abdomen: bowel sounds normal, soft and non-tender without masses, organomegaly or hernias noted.  No guarding or rebound.   Vascular : all pulses equal ; no bruits present.  Skin:Warm & dry.  Intact without suspicious lesions or rashes ; no tenting   Lymphatic: No lymphadenopathy is noted about the head, neck, axilla  Neuro: Strength, tone & DTRs normal.       Assessment & Plan:  #1 hypertension, possible overcorrection although asymptomatic  #2 status post thalamic stroke  #3 aortic stenosis  #4 severe 3 vessel CAD; CABG pending  Plan: See orders and after visit summary

## 2015-04-01 NOTE — Progress Notes (Signed)
Pre visit review using our clinic review tool, if applicable. No additional management support is needed unless otherwise documented below in the visit note. 

## 2015-04-02 DIAGNOSIS — I2510 Atherosclerotic heart disease of native coronary artery without angina pectoris: Secondary | ICD-10-CM | POA: Insufficient documentation

## 2015-04-02 NOTE — Assessment & Plan Note (Addendum)
03/21/15 CVTS recommends elective CABG @ least 6 weeks post acute CVA if cleared by Dr Pearlean Brownie

## 2015-04-04 ENCOUNTER — Other Ambulatory Visit: Payer: Self-pay | Admitting: Internal Medicine

## 2015-04-04 ENCOUNTER — Other Ambulatory Visit (INDEPENDENT_AMBULATORY_CARE_PROVIDER_SITE_OTHER): Payer: Medicare Other

## 2015-04-04 DIAGNOSIS — R739 Hyperglycemia, unspecified: Secondary | ICD-10-CM

## 2015-04-04 DIAGNOSIS — E7800 Pure hypercholesterolemia, unspecified: Secondary | ICD-10-CM

## 2015-04-04 LAB — HEMOGLOBIN A1C: Hgb A1c MFr Bld: 6.5 % (ref 4.6–6.5)

## 2015-04-05 ENCOUNTER — Encounter: Payer: Self-pay | Admitting: *Deleted

## 2015-04-05 ENCOUNTER — Encounter: Payer: Self-pay | Admitting: Cardiology

## 2015-04-05 ENCOUNTER — Ambulatory Visit (INDEPENDENT_AMBULATORY_CARE_PROVIDER_SITE_OTHER): Payer: Medicare Other | Admitting: Cardiology

## 2015-04-05 VITALS — BP 138/64 | HR 61 | Ht 64.0 in | Wt 178.0 lb

## 2015-04-05 DIAGNOSIS — I350 Nonrheumatic aortic (valve) stenosis: Secondary | ICD-10-CM

## 2015-04-05 DIAGNOSIS — I639 Cerebral infarction, unspecified: Secondary | ICD-10-CM | POA: Diagnosis not present

## 2015-04-05 DIAGNOSIS — R0683 Snoring: Secondary | ICD-10-CM | POA: Diagnosis not present

## 2015-04-05 DIAGNOSIS — E7800 Pure hypercholesterolemia, unspecified: Secondary | ICD-10-CM

## 2015-04-05 DIAGNOSIS — I739 Peripheral vascular disease, unspecified: Secondary | ICD-10-CM | POA: Diagnosis not present

## 2015-04-05 DIAGNOSIS — I208 Other forms of angina pectoris: Secondary | ICD-10-CM | POA: Diagnosis not present

## 2015-04-05 DIAGNOSIS — I6381 Other cerebral infarction due to occlusion or stenosis of small artery: Secondary | ICD-10-CM

## 2015-04-05 DIAGNOSIS — I2510 Atherosclerotic heart disease of native coronary artery without angina pectoris: Secondary | ICD-10-CM

## 2015-04-05 NOTE — Patient Instructions (Addendum)
Medication Instructions:  No changes today  Labwork: None today  Testing/Procedures: Your physician has requested that you have a lower  extremity arterial duplex. This test is an ultrasound of the arteries in the legs . It looks at arterial blood flow in the legs. Allow one hour for Lower Arterial scans. There are no restrictions or special instructions   Follow-Up: Your physician recommends that you schedule a follow-up appointment in: 2-3 weeks with PA/NP Huey Bienenstock if possible). Your physician recommends that you schedule a follow-up appointment in: September with Dr Shirlee Latch.   Any Other Special Instructions Will Be Listed Below (If Applicable).  Call our office if you have any chest pain/shortness or breath or other new symptoms.

## 2015-04-06 LAB — NMR LIPOPROFILE WITH LIPIDS
CHOLESTEROL, TOTAL: 164 mg/dL (ref 100–199)
HDL Particle Number: 35 umol/L (ref >=30.5)
HDL SIZE: 10.2 nm (ref >=9.2)
HDL-C: 82 mg/dL (ref >39)
LDL (calc): 74 mg/dL (ref 0–99)
LDL PARTICLE NUMBER: 724 nmol/L (ref <1000)
LDL Size: 21.1 nm (ref >=20.8)
LP-IR Score: <25 (ref <=45)
Large HDL-P: 13.4 umol/L (ref >=4.8)
Large VLDL-P: <0.8 nmol/L (ref <=2.7)
Small LDL Particle Number: <90 nmol/L (ref <=527)
Triglycerides: 42 mg/dL (ref 0–149)
VLDL SIZE: 40 nm (ref <=46.6)

## 2015-04-06 NOTE — Progress Notes (Signed)
Patient ID: Ronnie Boyd, male   DOB: 1927/05/22, 81 y.o.   MRN: [id number] PCP: Dr. Alwyn Ren  81 yo with HTN, diet-controlled diabetes, hyperlipidemia, moderate aortic stenosis, CVA, CAD, and PAD presents for cardiology followup.  Patient was admitted in 6/16 with small right medial thalamic/mid brain lacunar infarct manifesting with balance difficulty and slight drooping of the right eyelid.  Strokes signs completely resolved with no residual defect.  He had a carotid US in 6/16 without significant stenosis.    Patient also had been noting tightness in his chest with moderate exertion (yardwork, carrying a load).  Echo showed normal EF and moderate AS in 6/16.  He had an abnormal Cardiolite which was followed by [facility] in 7/16 showing significant 3 vessel disease.  He was started on Imdur and metoprolol was increased.  He was referred to cardiac surgery for evaluation as CABG was thought to be most likely to provide complete revascularization in this situation.   He saw Dr Dorris Fetch.  After discussion with neurology, it was decided that as long as he remains symptomatically stable, he should not have CABG until 3 months after CVA (which would be in the latter half of September).   Currently, he is doing well.  He has not been doing any strenuous activity and has not had any further chest pain.  No exertional dyspnea.  He has a sleep study scheduled. He also reports bilateral calf pain with ambulation (walking about 100 feet). He has a history of PAD but has also had myalgias related to statins.    Labs (3/12): K 3.9, creatinine 1.0, TSH normal, LDL 150, HDL 76 Labs (6/12): LFTs normal, LDL 91, HDL 71 Labs (8/12): LDL 62, HDL 75 LBS (10/12): K 3.6, creatinine 1.1 Labs (5/13): LDL 75, HDL 73 Labs (10/13): K 3.7, creatinine 1.1 Labs (5/14): LDL 81, HDL 76, K 3.7, creatinine 1.1 Labs (7/16): K 3.7, creatinine 1.27  PMH: 1. HTN 2. Hyperlipidemia: Myalgias with Crestor.  3. Diabetes mellitus:  diet-controlled 4. Allergic rhinitis 5. CAD: Myoview (4/05) with no ischemia or infarction.  LHC (7/16) with 60% pLAD, 80% mLAD, 80-90% dLAD, 70+% proximal LCx after high OM, 90% proximal high OM with diffuse severe distal disease, nondominant RCA with 80% ostial stenosis, 90% proximal stenosis, and 90% mid vessel stenosis.  6. PAD: ABIs 2005 with 0.92 on right, 0.78 on left.  Peripheral arterial evaluation (5/12): ABI 0.82 right/0.79 left, TBI 0.69 right/0.54 left (not at risk of tissue loss).  ABIs (12/13) with 0.84 right, 0.97 left (improved).  7. GERD 8. Prostatitis 9. S/p inguinal hernia repair. 10. Aortic stenosis: Moderate by echo.  Echo (3/12) with mild LVH, mild to moderate focal basal septal hypertrophy with no LV outflow tract gradient, mild aortic stenosis, normal RV size and systolic function.  Echo (6/16) with EF 65-70%, moderate AS with mean gradient 28 mmHg, AVA 1.12 cm^2.  11. CVA (6/16): Right medial thalamic/midbrain lacunar infarct.  6/16 carotid dopplers without significant stenosis.   SH: Retired, married, never smoked.    FH: Father with CVA in his 57s, no premature CAD, no AAA  ROS: All systems reviewed and negative except as per HPI.    Current Outpatient Prescriptions  Medication Sig Dispense Refill  . alum & mag hydroxide-simeth (MAALOX/MYLANTA) 200-200-20 MG/5ML suspension Take 15 mLs by mouth every 6 (six) hours as needed for indigestion or heartburn.    Marland Kitchen amLODipine (NORVASC) 10 MG tablet Take 1 tablet (10 mg total) by mouth daily. 90 tablet  3  . aspirin EC 81 MG tablet Take 81 mg by mouth daily.    . Azelastine-Fluticasone (DYMISTA) 137-50 MCG/ACT SUSP Place 1 spray into the nose 2 (two) times daily. (Patient taking differently: Place 1 spray into the nose as needed. ) 23 g 6  . clopidogrel (PLAVIX) 75 MG tablet Take 1 tablet (75 mg total) by mouth daily. 30 tablet 11  . Coenzyme Q10 200 MG capsule Take 200 mg by mouth daily. Take one daily    . Garlic Oil 1000  MG CAPS Take 1 capsule by mouth daily.      Marland Kitchen guaiFENesin (MUCINEX) 600 MG 12 hr tablet Take 1,200 mg by mouth 2 (two) times daily as needed for to loosen phlegm. With plenty of fluids    . isosorbide mononitrate (IMDUR) 60 MG 24 hr tablet Take 60 mg by mouth daily.    Marland Kitchen loratadine (CLARITIN) 10 MG tablet Take 10 mg by mouth daily as needed for rhinitis.     . metoprolol tartrate (LOPRESSOR) 25 MG tablet Take 25 mg by mouth 2 (two) times daily.    . Multiple Vitamins-Minerals (CENTRUM SILVER) tablet Take 1 tablet by mouth daily.      . Omega-3 Fatty Acids (FISH OIL) 1000 MG CAPS Take 1 capsule by mouth daily.      . pantoprazole (PROTONIX) 40 MG tablet Take 1 tablet (40 mg total) by mouth daily. 30 tablet 3  . potassium chloride SA (KLOR-CON M20) 20 MEQ tablet Take 1 tablet (20 mEq total) by mouth daily. 90 tablet 3  . pravastatin (PRAVACHOL) 20 MG tablet Take 1 tablet (20 mg total) by mouth daily. (Patient taking differently: Take 40 mg by mouth daily. ) 90 tablet 3  . Tamsulosin HCl (FLOMAX) 0.4 MG CAPS Take 1 capsule (0.4 mg total) by mouth as needed. (Patient taking differently: Take 0.4 mg by mouth daily. ) 90 capsule 3  . telmisartan-hydrochlorothiazide (MICARDIS HCT) 80-25 MG per tablet 1/2 daily 90 tablet 3   No current facility-administered medications for this visit.    BP 138/64 mmHg  Pulse 61  Ht  (1.626 m)  Wt 178 lb (80.74 kg)  BMI 30.54 kg/m2 General: NAD Neck: JVP 8 cm, no thyromegaly or thyroid nodule.  Lungs: Clear to auscultation bilaterally with normal respiratory effort. CV: Nondisplaced PMI.  Heart regular S1/S2, no S3/S4, 3/6 systolic crescendo-decrescendo murmur RUSB with clear S2 and radiation of murmur to neck.  1+ edema 1/3 up lower legs bilaterally.  1+ left PT pulse, difficult to palpate R PT pulse.  Abdomen: Soft, nontender, no hepatosplenomegaly, no distention.  Neurologic: Alert and oriented x 3.  Psych: Normal affect. Extremities: No clubbing or  cyanosis.   Assessment/Plan: 1. CAD: Diffuse severe 3 vessel disease.  He had angina with moderate to heavy exertion.  He has been started on Imdur and metoprolol was increased.  Since then, he has had no further chest tightness.  He has not been exerting himself heavily.   - I discussed situation with Dr Dorris Fetch today.  Most complete revascularization would be CABG, but patient had CVA in 6/16 and per discussion with neurologist, should wait 3 months post-CVA if possible for CABG.  At this time, we will continue medical management and plan CABG in 3/16.  If Mr Bendickson should develop any worsening in his symptoms or instability, more urgent CABG would be an option versus multivessel PCI with rotoblator (have reviewed films with Dr Excell Seltzer).  - Continue Imdur, metoprolol, pravastatin,  ASA 81, and Plavix 75 daily.  - Close followup in this office: he will see PA in 2-3 weeks and me in 6 weeks.  - He understands the need to call EMS/go to the hospital immediately with any unstable symptoms.  2. Aortic stenosis: Moderate on recent echo.  Plan to hold off on AVR when he has CABG to decrease time and complexity of surgery given recent stroke.  He would likely be a TAVR candidate in future if AS worsens.   3. CVA: 6/16 lacunar CVA, followed by neurology, on Plavix/ASA.  4. PAD: Bilateral calf pain after walking about 100 feet.  He has history of PAD, will arrange for peripheral arterial dopplers.  Alternatively, he has had similar pain from statins in the past.  5. Hyperlipidemia: Continue pravastatin 40 mg daily.  He has had trouble with statin-induced myalgias.  Lipids NMR profile has been sent by PCP.  6. HTN: BP is controlled.   7. OSA: Suspected.  Sleep study ordered by neurology.    Marca Ancona 04/06/2015 6:48 AM

## 2015-04-08 ENCOUNTER — Ambulatory Visit (HOSPITAL_COMMUNITY)
Admission: RE | Admit: 2015-04-08 | Discharge: 2015-04-08 | Disposition: A | Discharge status: Home / Self Care | Payer: Medicare Other | Source: Ambulatory Visit | Attending: Thoracic Surgery (Cardiothoracic Vascular Surgery) | Admitting: Thoracic Surgery (Cardiothoracic Vascular Surgery)

## 2015-04-08 DIAGNOSIS — I2510 Atherosclerotic heart disease of native coronary artery without angina pectoris: Secondary | ICD-10-CM | POA: Insufficient documentation

## 2015-04-08 LAB — PULMONARY FUNCTION TEST
DL/VA % PRED: 116 %
DL/VA: 4.77 ml/min/mmHg/L
DLCO UNC: 13.87 ml/min/mmHg
DLCO unc % pred: 58 %
FEF 25-75 PRE: 1.37 L/s
FEF 25-75 Post: 0.87 L/sec
FEF2575-%Change-Post: -36 %
FEF2575-%PRED-POST: 70 %
FEF2575-%PRED-PRE: 110 %
FEV1-%Change-Post: -6 %
FEV1-%PRED-PRE: 85 %
FEV1-%Pred-Post: 80 %
FEV1-POST: 1.44 L
FEV1-Pre: 1.55 L
FEV1FVC-%Change-Post: 7 %
FEV1FVC-%PRED-PRE: 105 %
FEV6-%CHANGE-POST: -15 %
FEV6-%PRED-POST: 69 %
FEV6-%PRED-PRE: 81 %
FEV6-PRE: 1.98 L
FEV6-Post: 1.67 L
FEV6FVC-%CHANGE-POST: 0 %
FEV6FVC-%PRED-PRE: 108 %
FEV6FVC-%Pred-Post: 108 %
FVC-%Change-Post: -12 %
FVC-%PRED-POST: 65 %
FVC-%Pred-Pre: 75 %
FVC-Post: 1.72 L
FVC-Pre: 1.98 L
POST FEV1/FVC RATIO: 84 %
PRE FEV1/FVC RATIO: 78 %
Post FEV6/FVC ratio: 100 %
Pre FEV6/FVC Ratio: 100 %
RV % pred: 81 %
RV: 1.98 L
TLC % PRED: 77 %
TLC: 4.48 L

## 2015-04-08 MED ORDER — ALBUTEROL SULFATE (2.5 MG/3ML) 0.083% IN NEBU
2.5000 mg | INHALATION_SOLUTION | Freq: Once | RESPIRATORY_TRACT | Status: AC
Start: 2015-04-08 — End: 2015-04-08
  Administered 2015-04-08: 2.5 mg via RESPIRATORY_TRACT

## 2015-04-12 ENCOUNTER — Other Ambulatory Visit: Payer: Self-pay | Admitting: Cardiology

## 2015-04-12 DIAGNOSIS — I739 Peripheral vascular disease, unspecified: Secondary | ICD-10-CM

## 2015-04-14 ENCOUNTER — Other Ambulatory Visit (HOSPITAL_COMMUNITY): Payer: Medicare Other

## 2015-04-14 ENCOUNTER — Encounter (HOSPITAL_COMMUNITY): Payer: Medicare Other

## 2015-04-14 ENCOUNTER — Ambulatory Visit (HOSPITAL_COMMUNITY)
Admission: RE | Admit: 2015-04-14 | Discharge: 2015-04-14 | Disposition: A | Discharge status: Home / Self Care | Payer: Medicare Other | Source: Ambulatory Visit | Attending: Cardiovascular Disease | Admitting: Cardiovascular Disease

## 2015-04-14 DIAGNOSIS — I739 Peripheral vascular disease, unspecified: Secondary | ICD-10-CM | POA: Diagnosis present

## 2015-04-18 ENCOUNTER — Inpatient Hospital Stay: Admit: 2015-04-18 | Payer: Self-pay | Admitting: Thoracic Surgery (Cardiothoracic Vascular Surgery)

## 2015-04-18 ENCOUNTER — Other Ambulatory Visit: Payer: Self-pay | Admitting: Cardiology

## 2015-04-18 SURGERY — ECHOCARDIOGRAM, TRANSESOPHAGEAL
Anesthesia: General | Site: Chest

## 2015-04-19 ENCOUNTER — Ambulatory Visit (INDEPENDENT_AMBULATORY_CARE_PROVIDER_SITE_OTHER): Payer: Medicare Other | Admitting: Neurology

## 2015-04-19 DIAGNOSIS — G4733 Obstructive sleep apnea (adult) (pediatric): Secondary | ICD-10-CM | POA: Diagnosis not present

## 2015-04-19 DIAGNOSIS — I6381 Other cerebral infarction due to occlusion or stenosis of small artery: Secondary | ICD-10-CM

## 2015-04-19 DIAGNOSIS — I63211 Cerebral infarction due to unspecified occlusion or stenosis of right vertebral arteries: Secondary | ICD-10-CM

## 2015-04-19 DIAGNOSIS — R0683 Snoring: Secondary | ICD-10-CM

## 2015-04-19 DIAGNOSIS — G4710 Hypersomnia, unspecified: Secondary | ICD-10-CM

## 2015-04-19 DIAGNOSIS — G4730 Sleep apnea, unspecified: Principal | ICD-10-CM

## 2015-04-19 DIAGNOSIS — I6322 Cerebral infarction due to unspecified occlusion or stenosis of basilar arteries: Secondary | ICD-10-CM

## 2015-04-19 NOTE — Sleep Study (Signed)
Please see the scanned sleep study interpretation located in the Procedure tab within the Chart Review section. 

## 2015-04-26 ENCOUNTER — Encounter: Payer: Self-pay | Admitting: Cardiovascular Disease

## 2015-04-26 ENCOUNTER — Ambulatory Visit (INDEPENDENT_AMBULATORY_CARE_PROVIDER_SITE_OTHER): Payer: Medicare Other | Admitting: Cardiovascular Disease

## 2015-04-26 VITALS — BP 134/74 | HR 67 | Ht 64.0 in | Wt 173.6 lb

## 2015-04-26 DIAGNOSIS — I739 Peripheral vascular disease, unspecified: Secondary | ICD-10-CM | POA: Diagnosis not present

## 2015-04-26 NOTE — Patient Instructions (Addendum)
Medication Instructions:   Your physician recommends that you continue on your current medications as directed. Please refer to the Current Medication list given to you today.\    Labwork:  NONE ORDER TODAY   Testing/Procedures:  NONE ORDER TODAY    Follow-Up:  Your physician wants you to follow-up in:  IN 4   MONTHS WITH DR Kirke Corin You will receive a reminder letter in the mail two months in advance. If you don't receive a letter, please call our office to schedule the follow-up appointment.      Any Other Special Instructions Will Be Listed Below (If Applicable).

## 2015-04-26 NOTE — Progress Notes (Signed)
Patient ID: Ronnie Boyd, male   DOB: [birth year], 81 y.o.   MRN: [id number] PCP: Dr. Alwyn Ren Primary cardiologist: Dr. Shirlee Latch  81 yo with HTN, diet-controlled diabetes, hyperlipidemia, moderate aortic stenosis, CVA, CAD, and PAD who was referred by Dr. Shirlee Latch for evaluation and management of peripheral arterial disease.  Patient was admitted in 6/16 with small right medial thalamic/mid brain lacunar infarct manifesting with balance difficulty and slight drooping of the right eyelid.  Strokes signs completely resolved with no residual defect.  He had a carotid US in 6/16 without significant stenosis.    He has known history of coronary artery disease and moderate aortic stenosis.  Echo showed normal EF and moderate AS in 6/16.  He had an abnormal Cardiolite which was followed by [facility] in 7/16 showing significant 3 vessel disease.  He was started on Imdur and metoprolol was increased.  He was referred to cardiac surgery for evaluation as CABG was thought to be most likely to provide complete revascularization in this situation.   He saw Dr Dorris Fetch.  After discussion with neurology, it was decided that as long as he remains symptomatically stable, he should not have CABG until 3 months after CVA (which would be in the latter half of September).   He reports bilateral calf pain which is equal in both sides. This happens after walking about 2-3 blocks. The pain usually resolves after resting for about 2 minutes. The symptoms started about 3 years ago and he thought it was due to atorvastatin. He has no rest pain or lower extremity ulceration. He is not a smoker. He reports improvement in his angina on medical therapy.  PMH: 1. HTN 2. Hyperlipidemia: Myalgias with Crestor.  3. Diabetes mellitus: diet-controlled 4. Allergic rhinitis 5. CAD: Myoview (4/05) with no ischemia or infarction.  LHC (7/16) with 60% pLAD, 80% mLAD, 80-90% dLAD, 70+% proximal LCx after high OM, 90% proximal high OM with  diffuse severe distal disease, nondominant RCA with 80% ostial stenosis, 90% proximal stenosis, and 90% mid vessel stenosis.  6. PAD: ABIs 2005 with 0.92 on right, 0.78 on left.  Peripheral arterial evaluation (5/12): ABI 0.82 right/0.79 left, TBI 0.69 right/0.54 left (not at risk of tissue loss).  ABIs (12/13) with 0.84 right, 0.97 left (improved).  7. GERD 8. Prostatitis 9. S/p inguinal hernia repair. 10. Aortic stenosis: Moderate by echo.  Echo (3/12) with mild LVH, mild to moderate focal basal septal hypertrophy with no LV outflow tract gradient, mild aortic stenosis, normal RV size and systolic function.  Echo (6/16) with EF 65-70%, moderate AS with mean gradient 28 mmHg, AVA 1.12 cm^2.  11. CVA (6/16): Right medial thalamic/midbrain lacunar infarct.  6/16 carotid dopplers without significant stenosis.   SH: Retired, married, never smoked.    FH: Father with CVA in his 54s, no premature CAD, no AAA  ROS: All systems reviewed and negative except as per HPI.    Current Outpatient Prescriptions  Medication Sig Dispense Refill  . alum & mag hydroxide-simeth (MAALOX/MYLANTA) 200-200-20 MG/5ML suspension Take 15 mLs by mouth every 6 (six) hours as needed for indigestion or heartburn.    Marland Kitchen amLODipine (NORVASC) 10 MG tablet Take 1 tablet (10 mg total) by mouth daily. 90 tablet 3  . aspirin EC 81 MG tablet Take 81 mg by mouth daily.    . Azelastine-Fluticasone (DYMISTA) 137-50 MCG/ACT SUSP Place 1 spray into the nose daily as needed (allergies).    . clopidogrel (PLAVIX) 75 MG tablet Take  1 tablet (75 mg total) by mouth daily. 30 tablet 11  . Coenzyme Q10 200 MG capsule Take 200 mg by mouth daily. Take one daily    . Garlic Oil 1000 MG CAPS Take 1 capsule by mouth daily.      Marland Kitchen guaiFENesin (MUCINEX) 600 MG 12 hr tablet Take 1,200 mg by mouth 2 (two) times daily as needed for to loosen phlegm. With plenty of fluids    . isosorbide mononitrate (IMDUR) 60 MG 24 hr tablet Take 60 mg by mouth daily.     Marland Kitchen loratadine (CLARITIN) 10 MG tablet Take 10 mg by mouth daily as needed for rhinitis.     . metoprolol tartrate (LOPRESSOR) 25 MG tablet Take 25 mg by mouth 2 (two) times daily.    . Multiple Vitamins-Minerals (CENTRUM SILVER) tablet Take 1 tablet by mouth daily.      . Omega-3 Fatty Acids (FISH OIL) 1000 MG CAPS Take 1 capsule by mouth daily.      . pantoprazole (PROTONIX) 40 MG tablet Take 1 tablet (40 mg total) by mouth daily. 30 tablet 3  . potassium chloride SA (KLOR-CON M20) 20 MEQ tablet Take 1 tablet (20 mEq total) by mouth daily. 90 tablet 3  . pravastatin (PRAVACHOL) 20 MG tablet Take 20 mg by mouth 2 (two) times daily.    Marland Kitchen telmisartan-hydrochlorothiazide (MICARDIS HCT) 80-25 MG per tablet Take 1/2 tablet by mouth daily 90 tablet 3   No current facility-administered medications for this visit.    BP 134/74 mmHg  Pulse 67  Ht 5\' 4"  (1.626 m)  Wt 173 lb 9.6 oz (78.744 kg)  BMI 29.78 kg/m2 General: NAD Neck: JVP 8 cm, no thyromegaly or thyroid nodule.  Lungs: Clear to auscultation bilaterally with normal respiratory effort. CV: Nondisplaced PMI.  Heart regular S1/S2, no S3/S4, 3/6 systolic crescendo-decrescendo murmur RUSB with clear S2 and radiation of murmur to neck.  1+ edema 1/3 up lower legs bilaterally.  1+ left PT pulse, difficult to palpate R PT pulse.  Abdomen: Soft, nontender, no hepatosplenomegaly, no distention.  Neurologic: Alert and oriented x 3.  Psych: Normal affect. Extremities: No clubbing or cyanosis.  Vascular: Femoral pulses are normal. Distal pulses are not palpable.    Assessment/Plan: 1. CAD: Diffuse severe 3 vessel disease.  He had angina with moderate to heavy exertion.  He has been started on Imdur and metoprolol was increased.  CABG is planned in September. I reviewed cardiac catheterization personally and also with Dr. Shirlee Latch. The patient has diffuse calcified disease and CABG will offer him more complete revascularization then PCI. However, if  the patient is deemed to be too high risk for CABG or he does not want to proceed with that, then an alternative would be PCI of the LAD and left circumflex with medical treatment of the ramus and RCA. - Continue Imdur, metoprolol, pravastatin, ASA 81, and Plavix 75 daily.   2. Aortic stenosis: Moderate on recent echo.  Plan to hold off on AVR when he has CABG to decrease time and complexity of surgery given recent stroke.  He would likely be a TAVR candidate in future if AS worsens.    3. PAD: Bilateral calf pain after walking about 100 feet.  His pain is overall mild and he has been able to walk through this for the majority of the time. He does not feel that this is significantly limited his activities. Thus, I think it's reasonable to continue with medical therapy. This can be  reevaluated after the decision has been made about his coronary artery disease.    Lorine Bears

## 2015-04-28 ENCOUNTER — Telehealth: Payer: Self-pay

## 2015-04-28 NOTE — Telephone Encounter (Signed)
Called pt with sleep study results. No answer, left a message asking him to call me back.

## 2015-04-28 NOTE — Telephone Encounter (Signed)
Spoke to pt regarding sleep study results. Advised him that his PSG showed significant sleep apnea, but the cpap seemed to be effective in treating his apnea. Dr. Vickey Huger recommends starting cpap. Pt was a little reluctant to start cpap. He wishes to make a f/u appt with her to discuss his results further. Appt was made with pt on 9/12 at 11:15 to discuss results and to explain in further detail what cpap entails. Pt verbalized understanding to call us to change his appt if he cannot make the 9/12 appt.

## 2015-05-03 ENCOUNTER — Telehealth: Payer: Self-pay

## 2015-05-03 MED ORDER — PRAVASTATIN SODIUM 40 MG PO TABS: 40.0000 mg | ORAL_TABLET | Freq: Every evening | ORAL | Status: DC

## 2015-05-03 NOTE — Telephone Encounter (Signed)
Ronnie Boyd's wife called in asking if husband still needed to take the pravastatin 20 mg twice daily or if they could take 40 mg daily instead. She would like a call back to let her know. I told her I would send message to nurse. Phone number in chart is correct cal back number.

## 2015-05-03 NOTE — Telephone Encounter (Signed)
Pt advised to take pravastatin  daily in the evening.

## 2015-05-09 ENCOUNTER — Ambulatory Visit: Payer: Medicare Other | Admitting: Physician Assistant

## 2015-05-20 ENCOUNTER — Encounter: Payer: Self-pay | Admitting: Cardiology

## 2015-05-20 ENCOUNTER — Ambulatory Visit (INDEPENDENT_AMBULATORY_CARE_PROVIDER_SITE_OTHER): Payer: Medicare Other | Admitting: Cardiology

## 2015-05-20 VITALS — BP 164/78 | HR 64 | Ht 64.0 in | Wt 175.0 lb

## 2015-05-20 DIAGNOSIS — I2510 Atherosclerotic heart disease of native coronary artery without angina pectoris: Secondary | ICD-10-CM | POA: Diagnosis not present

## 2015-05-20 DIAGNOSIS — I10 Essential (primary) hypertension: Secondary | ICD-10-CM

## 2015-05-20 DIAGNOSIS — I350 Nonrheumatic aortic (valve) stenosis: Secondary | ICD-10-CM | POA: Diagnosis not present

## 2015-05-20 NOTE — Patient Instructions (Signed)
Medication Instructions:  No changes today  Labwork: None today  Testing/Procedures: I will call you Monday about scheduling a coronary PCI with Dr Kirke Corin.   Follow-Up: Your physician recommends that you schedule a follow-up appointment in: 6 weeks with Dr Shirlee Latch.

## 2015-05-22 NOTE — Progress Notes (Deleted)
   Subjective:    Patient ID: Ronnie Boyd, male    DOB: Feb 02, 1927, 81 y.o.   MRN: [id number]  HPI    Review of Systems     Objective:   Physical Exam        Assessment & Plan:

## 2015-05-22 NOTE — Progress Notes (Signed)
Patient ID: Ronnie Boyd, male   DOB: 05/27/1927, 81 y.o.   MRN: [id number] PCP: Dr. Hopper  81 yo with HTN, diet-controlled diabetes, hyperlipidemia, moderate aortic stenosis, CVA, CAD, and PAD presents for cardiology followup.  Patient was admitted in 6/16 with small right medial thalamic/mid brain lacunar infarct manifesting with balance difficulty and slight drooping of the right eyelid.  Strokes signs completely resolved with no residual defect.  He had a carotid US in 6/16 without significant stenosis.    Patient also had been noting tightness in his chest with moderate exertion (yardwork, carrying a load).  Echo showed normal EF and moderate AS in 6/16.  He had an abnormal Cardiolite which was followed by LHC in 7/16 showing significant 3 vessel disease.  He was started on Imdur and metoprolol was increased.  He was referred to cardiac surgery for evaluation as CABG was thought to be most likely to provide complete revascularization in this situation.   He saw Dr Hendrickson.  After discussion with neurology, it was decided that as long as he remains symptomatically stable, he should not have CABG until 3 months after CVA (which would be in the latter half of September).   Currently, he is doing well.  He has not been doing any strenuous activity and has not had any further chest pain.  No exertional dyspnea.  He had OSA on sleep study and needs to be set up for CPAP. He also reports bilateral calf pain with ambulation (walking about 100 feet) => he had peripheral arterial dopplers with below-the-knee disease, plan to manage medically. His Micardis was decreased to 40/12.5 because of episodes of SBP in 90s.  It is running high today.  His home BP log shows fairly wide variation in BP.    Labs (3/12): K 3.9, creatinine 1.0, TSH normal, LDL 150, HDL 76 Labs (6/12): LFTs normal, LDL 91, HDL 71 Labs (8/12): LDL 62, HDL 75 LBS (10/12): K 3.6, creatinine 1.1 Labs (5/13): LDL 75, HDL 73 Labs (10/13):  K 3.7, creatinine 1.1 Labs (5/14): LDL 81, HDL 76, K 3.7, creatinine 1.1 Labs (7/16): K 3.7, creatinine 1.27, LDL 74, LDL-P 724  PMH: 1. HTN 2. Hyperlipidemia: Myalgias with Crestor.  3. Diabetes mellitus: diet-controlled 4. Allergic rhinitis 5. CAD: Myoview (4/05) with no ischemia or infarction.  LHC (7/16) with 60% pLAD, 80% mLAD, 80-90% dLAD, 70+% proximal LCx after high OM, 90% proximal high OM with diffuse severe distal disease, nondominant RCA with 80% ostial stenosis, 90% proximal stenosis, and 90% mid vessel stenosis.  6. PAD: ABIs 2005 with 0.92 on right, 0.78 on left.  Peripheral arterial evaluation (5/12): ABI 0.82 right/0.79 left, TBI 0.69 right/0.54 left (not at risk of tissue loss).  ABIs (12/13) with 0.84 right, 0.97 left (improved).  Peripheral arterial dopplers (8/16) with ABI 0.76 on right, 0.73 on left => occluded bilateral anterior tibial arteries and left posterior tibial artery.  7. GERD 8. Prostatitis 9. S/p inguinal hernia repair. 10. Aortic stenosis: Moderate by echo.  Echo (3/12) with mild LVH, mild to moderate focal basal septal hypertrophy with no LV outflow tract gradient, mild aortic stenosis, normal RV size and systolic function.  Echo (6/16) with EF 65-70%, moderate AS with mean gradient 28 mmHg, AVA 1.12 cm^2.  11. CVA (6/16): Right medial thalamic/midbrain lacunar infarct.  6/16 carotid dopplers without significant stenosis.  12. OSA: CPAP recommended.   SH: Retired, married, never smoked.    FH: Father with CVA in his 40s, no premature   CAD, no AAA  ROS: All systems reviewed and negative except as per HPI.    Current Outpatient Prescriptions  Medication Sig Dispense Refill  . alum & mag hydroxide-simeth (MAALOX/MYLANTA) 200-200-20 MG/5ML suspension Take 15 mLs by mouth every 6 (six) hours as needed for indigestion or heartburn.    . amLODipine (NORVASC) 10 MG tablet Take 1 tablet (10 mg total) by mouth daily. 90 tablet 3  . aspirin EC 81 MG tablet Take  81 mg by mouth daily.    . Azelastine-Fluticasone (DYMISTA) 137-50 MCG/ACT SUSP Place 1 spray into the nose daily as needed (allergies).    . clopidogrel (PLAVIX) 75 MG tablet Take 1 tablet (75 mg total) by mouth daily. 30 tablet 11  . Coenzyme Q10 200 MG capsule Take 200 mg by mouth daily. Take one daily    . Garlic Oil 1000 MG CAPS Take 1 capsule by mouth daily.      . guaiFENesin (MUCINEX) 600 MG 12 hr tablet Take 1,200 mg by mouth 2 (two) times daily as needed for to loosen phlegm. With plenty of fluids    . isosorbide mononitrate (IMDUR) 60 MG 24 hr tablet Take 60 mg by mouth daily.    . loratadine (CLARITIN) 10 MG tablet Take 10 mg by mouth daily as needed for rhinitis.     . metoprolol tartrate (LOPRESSOR) 25 MG tablet Take 25 mg by mouth 2 (two) times daily.    . Multiple Vitamins-Minerals (CENTRUM SILVER) tablet Take 1 tablet by mouth daily.      . Omega-3 Fatty Acids (FISH OIL) 1000 MG CAPS Take 1 capsule by mouth daily.      . pantoprazole (PROTONIX) 40 MG tablet Take 1 tablet (40 mg total) by mouth daily. 30 tablet 3  . potassium chloride SA (KLOR-CON M20) 20 MEQ tablet Take 1 tablet (20 mEq total) by mouth daily. 90 tablet 3  . pravastatin (PRAVACHOL) 40 MG tablet Take 1 tablet (40 mg total) by mouth every evening. 90 tablet 1  . telmisartan-hydrochlorothiazide (MICARDIS HCT) 80-25 MG per tablet Take 1/2 tablet by mouth daily 90 tablet 3   No current facility-administered medications for this visit.    BP 164/78 mmHg  Pulse 64  Ht 5' 4" (1.626 m)  Wt 175 lb (79.379 kg)  BMI 30.02 kg/m2 General: NAD Neck: JVP 7 cm, no thyromegaly or thyroid nodule.  Lungs: Clear to auscultation bilaterally with normal respiratory effort. CV: Nondisplaced PMI.  Heart regular S1/S2, no S3/S4, 3/6 systolic crescendo-decrescendo murmur RUSB with clear S2 and radiation of murmur to neck.  Trace ankle edema.  1+ left PT pulse, difficult to palpate R PT pulse.  Abdomen: Soft, nontender, no  hepatosplenomegaly, no distention.  Neurologic: Alert and oriented x 3.  Psych: Normal affect. Extremities: No clubbing or cyanosis.   Assessment/Plan: 1. CAD: Diffuse severe 3 vessel disease.  He had angina with moderate to heavy exertion.  He has been started on Imdur and metoprolol was increased.  Since then, he has had no further chest tightness but he has not been exerting himself heavily.  Most complete revascularization would be CABG.  Mr Munroe saw Dr Arida for PV.  I reviewed his cath films with Dr Arida also, and it looks like we could intervene successfully on his LAD and LCx with medical treatment of the RCA and ramus.  As his targets are not particularly good for CABG, this may be the better choice.   - Continue Imdur, metoprolol, pravastatin, ASA 81,   and Plavix 75 daily.  - I will arrange PCI with Dr Kirke Corin.  - He understands the need to call EMS/go to the hospital immediately with any unstable symptoms.  2. Aortic stenosis: Moderate on recent echo.  He would likely be a TAVR candidate in future if AS worsens.   3. CVA: 6/16 lacunar CVA, followed by neurology, on Plavix/ASA.  4. PAD: Bilateral calf pain after walking about 100 feet. Extensive below-knee PAD.  Medical management.  Continue statin, Plavix, and ASA.   5. Hyperlipidemia: Continue pravastatin 40 mg daily.  Good lipids in 7/16.  6. HTN: BP was low so Micardis was cut in half. BP is varying a lot, I will have him continue to keep track of it in his log book.   7. OSA: Needs to start CPAP.    Marca Ancona 05/22/2015 11:49 PM

## 2015-05-23 ENCOUNTER — Encounter: Payer: Self-pay | Admitting: *Deleted

## 2015-05-23 ENCOUNTER — Other Ambulatory Visit: Payer: Self-pay | Admitting: *Deleted

## 2015-05-23 ENCOUNTER — Ambulatory Visit (INDEPENDENT_AMBULATORY_CARE_PROVIDER_SITE_OTHER): Payer: Medicare Other | Admitting: Neurology

## 2015-05-23 ENCOUNTER — Telehealth: Payer: Self-pay

## 2015-05-23 ENCOUNTER — Encounter: Payer: Self-pay | Admitting: Neurology

## 2015-05-23 VITALS — BP 134/60 | HR 52 | Resp 20 | Ht 64.0 in | Wt 175.0 lb

## 2015-05-23 DIAGNOSIS — G478 Other sleep disorders: Secondary | ICD-10-CM

## 2015-05-23 DIAGNOSIS — Z01812 Encounter for preprocedural laboratory examination: Secondary | ICD-10-CM

## 2015-05-23 DIAGNOSIS — G4733 Obstructive sleep apnea (adult) (pediatric): Secondary | ICD-10-CM

## 2015-05-23 DIAGNOSIS — I2510 Atherosclerotic heart disease of native coronary artery without angina pectoris: Secondary | ICD-10-CM

## 2015-05-23 DIAGNOSIS — I6310 Cerebral infarction due to embolism of unspecified precerebral artery: Secondary | ICD-10-CM | POA: Diagnosis not present

## 2015-05-23 NOTE — Progress Notes (Signed)
SLEEP MEDICINE CLINIC   Provider:  Melvyn Novas, M D  Referring Provider: Newt Lukes, MD Primary Care Physician:  Rene Paci, MD  Chief Complaint  Patient presents with  . Follow-up    sleep study, wants to discuss starting cpap, rm 11, with wife    HPI:  Ronnie Boyd is a 81 y.o. male  Is seen here as a referral  from Ronnie Boyd and Ronnie Boyd for a sleep evaluation.  Ronnie Boyd was seen during his hospitalization by my colleague, Ronnie Boyd.  He had presented with a recent stroke that affected the thalamic region. One of his symptoms was leaning towards the left and weaker side. Doctor Pearlean Boyd  had added a baby aspirin to Plavix 75 mg daily , and had explained the goals of control of hypertension blood pressure and cholesterol. The patient was advised that he should partake in regular exercise, moderate diet.  The patient noted that he likes to walk but that he developed cramps in his thighs and both lower extremities , which he attributes to his cholesterol-lowering medication.  Change from Lipitor to Pravachol followed recently. This caused him to have myositis symptoms.  I quote from r. Doolittle's note dated 03-17-15 that the patient was noted by family to develop excessive sleepiness over the preceding 2 weeks ago . The symptoms lasted for several days before .Ronnie Boyd also noticed that he was slightly off balance, identifying a fall risk. The wife noted his right eyelid to be droopy but there was no vision impairment.  The daughter notices this as well . Headaches or speech difficulties were denied.  His symptoms had improved and he was also not quite as sleepy anymore and walking better when he was finally receiving an outpatient MRI scan of the brain on 6-20 4-16 which showed a small acute right medial thalamic and midbrain infarct. This was measured to be a local. The MRI of the brain showed no large vessel stenosis and carotid ultrasound from 6-20 9-16  reviewed by Dr. city showed no significant stenosis in the extracranial parts. He had a transthoracic cardiac echo done which was normal.  Nuclear medicine scan was normal and he is therefore scheduled to undergo cardiac catheterization with Dr. Marca Ancona.   The patient reports that he has no trouble falling asleep when watching TV in his living room. He is has an easier time sleeping in a seated position or reclined position. When he transfers to the bedroom is not as easy for him to find a comfortable sleep position and to go to sleep. He describes his marital bedroom is cool, quiet and dark. He sleeps on his back on multiple pillows . The patient has to go to the bathroom at least 2-3 times at night and he is taking diabetic medications. Usually can reinitiate sleep quickly after bathroom break. He rises in the morning at about 6:30 AM spontaneously without alarm. He does not describe any morning headaches, he wakes up with a dry mouth. His wife states usually in bed he often returns to the bedroom a nap for another hour. Does not drink any caffeinated beverages. He is retired from his career job but he does keep his  hobby of lawn and garden care.He takes naps, after lunch, for 1 hour . He can easily fall asleep in daytime. He takes an after dinner nap daily. Total sleep time over 24 hours is about 8 hours. He no history of ENT, neck of facial  surgery.  Denies falling asleep driving.  He feels back to baseline.  Interval history, dated 05-23-15  Ronnie Boyd underwent a sleep study was split night protocol on 04-19-15 . He  had endorsed the Epworth sleepiness score in the sleep lab at only 3 points but previously in the office endorsed 11 points.  He was only asleep for about 50% of the recorded time for the diagnostic part of the study.  He was diagnosed with moderate to severe apnea at an AHI of 28.6 and severe upper airway restriction with an RDI of 55.6. In supine sleep his AHI was 30.4 and  unfortunately REM sleep was not recorded. His heart rate varied between 42 and 55. The patient was titrated to CPAP and the AHI became 0.0 under 9 cm water pressure. Now he was able to reach REM sleep AHI in rem sleep became 0 as well. Recommended to start the patient on CPAP with a 9 cm water pressure setting and a nasal mask. Heated humidity should be added. We are here today to discuss the results of the recent study. He has not started CPAP.Marland Kitchen His Epworth sleepiness score today is at 10 points. Ronnie Boyd reports that he is able to dream, and usually feels refreshed and restored in the morning when waking up. He was advised of the degree of apnea, the association with bradycardia, he did not have significant oxygen drops. My recommendation would still be a CPAP given that his RDI was excessively high in conjunction with a moderate degree of apnea. I also explained that the dental device could not be used in patients that have this degree of apnea.     Review of Systems: Out of a complete 14 system review, the patient complains of only the following symptoms, and all other reviewed systems are negative. Snoring.  Epworth score 10 , Fatigue severity score 21  , depression score 2 points    Social History   Social History  . Marital Status: Married    Spouse Name: [name]  . Number of Children: [name]  . Years of Education: [name]   Occupational History  . retired    Social History Main Topics  . Smoking status: Never Smoker   . Smokeless tobacco: Not on file  . Alcohol Use: No  . Drug Use: No  . Sexual Activity: Not on file   Other Topics Concern  . Not on file   Social History Narrative    Family History  Problem Relation Age of Onset  . Arthritis    . Hypertension Mother   . Diabetes Mother 39  . Stroke Father 65  . Diabetes Brother     Past Medical History  Diagnosis Date  . Allergic rhinitis   . Hypertension   . PVD (peripheral vascular disease)   . Hypercholesteremia   .  Diabetes mellitus     borderline  . GERD (gastroesophageal reflux disease)   . Hemorrhoids   . Prostatitis     hx of  . Degenerative joint disease   . Aortic stenosis     mild echo 7/14  . Acid reflux     Past Surgical History  Procedure Laterality Date  . Carpal tunnel release      bilateral  . Hemorrhoid surgery  1989  . Cardiac catheterization [name] 03/18/2015    Procedure: Left Heart Cath and Coronary Angiography;  Surgeon: Laurey Morale, MD;  Location: [facility] INVASIVE CV LAB;  Service: Cardiovascular;  Laterality: [name];  Current Outpatient Prescriptions  Medication Sig Dispense Refill  . alum & mag hydroxide-simeth (MAALOX/MYLANTA) 200-200-20 MG/5ML suspension Take 15 mLs by mouth every 6 (six) hours as needed for indigestion or heartburn.    Marland Kitchen amLODipine (NORVASC) 10 MG tablet Take 1 tablet (10 mg total) by mouth daily. 90 tablet 3  . aspirin EC 81 MG tablet Take 81 mg by mouth daily.    . Azelastine-Fluticasone (DYMISTA) 137-50 MCG/ACT SUSP Place 1 spray into the nose daily as needed (allergies).    . clopidogrel (PLAVIX) 75 MG tablet Take 1 tablet (75 mg total) by mouth daily. 30 tablet 11  . Coenzyme Q10 200 MG capsule Take 200 mg by mouth daily. Take one daily    . Garlic Oil 1000 MG CAPS Take 1 capsule by mouth daily.      Marland Kitchen guaiFENesin (MUCINEX) 600 MG 12 hr tablet Take 1,200 mg by mouth 2 (two) times daily as needed for to loosen phlegm. With plenty of fluids    . isosorbide mononitrate (IMDUR) 60 MG 24 hr tablet Take 60 mg by mouth daily.    Marland Kitchen loratadine (CLARITIN) 10 MG tablet Take 10 mg by mouth daily as needed for rhinitis.     . metoprolol tartrate (LOPRESSOR) 25 MG tablet Take 25 mg by mouth 2 (two) times daily.    . Multiple Vitamins-Minerals (CENTRUM SILVER) tablet Take 1 tablet by mouth daily.      . Omega-3 Fatty Acids (FISH OIL) 1000 MG CAPS Take 1 capsule by mouth daily.      . pantoprazole (PROTONIX) 40 MG tablet Take 1 tablet (40 mg total) by mouth daily. 30  tablet 3  . potassium chloride SA (KLOR-CON M20) 20 MEQ tablet Take 1 tablet (20 mEq total) by mouth daily. 90 tablet 3  . pravastatin (PRAVACHOL) 40 MG tablet Take 1 tablet (40 mg total) by mouth every evening. 90 tablet 1  . telmisartan-hydrochlorothiazide (MICARDIS HCT) 80-25 MG per tablet Take 1/2 tablet by mouth daily 90 tablet 3   No current facility-administered medications for this visit.    Allergies as of 05/23/2015  . (No Known Allergies)    Vitals: There were no vitals taken for this visit. Last Weight:  Wt Readings from Last 1 Encounters:  05/20/15 175 lb (79.379 kg)       Last Height:   Ht Readings from Last 1 Encounters:  05/20/15 5\' 4"  (1.626 m)    Physical exam:  General: The patient is awake, alert and appears not in acute distress. The patient is well groomed. Head: Normocephalic, atraumatic. Neck is supple. Mallampati 4  neck circumference:18. Nasal airflow unrestricted , TMJ is  Not eevident . Retrognathia is seen.  Cardiovascular:  Regular rate and rhythm , without  murmurs or carotid bruit, and without distended neck veins. Respiratory: Lungs are clear to auscultation. Skin:  Without evidence of edema, or rash Trunk: BMI is  elevated and patient  has normal posture.  Neurologic exam : The patient is awake and alert, oriented to place and time.   Memory subjective  described as intact. There is a normal attention span & concentration ability.  Speech is fluent without  dysarthria, dysphonia or aphasia. Mood and affect are appropriate.  Cranial nerves: Pupils are equal and briskly reactive to light.  Funduscopic exam without  evidence of pallor or edema. Extraocular movements  in vertical and horizontal planes intact and without nystagmus. Visual fields by finger perimetry are intact. Hearing to finger rub intact.  Facial sensation intact to fine touch. Facial motor strength is symmetric and tongue and uvula move midline.  Motor exam:  Normal tone,  muscle bulk and symmetric ,strength in all extremities. Sensory:  Fine touch, pinprick and vibration were tested in all extremities.  Proprioception is tested in the upper extremities -normal. Coordination: Rapid alternating movements in the fingers/hands is normal.  Finger-to-nose maneuver  normal without evidence of ataxia, dysmetria or tremor. Gait and station: Patient walks without assistive device and is able unassisted to climb up to the exam table. Strength within normal limits. Stance is stable and normal. Tandem gait is unfragmented. Romberg testing is negative. Deep tendon reflexes: in the  upper and lower extremities are symmetric and intact. Babinski maneuver response is downgoing.  Assessment:  After physical and neurologic examination, review of laboratory studies, imaging, neurophysiology testing and pre-existing records, assessment is   Patient recently documented coronary artery disease status post heart catheterization on 03-18-15.  His bypass surgery was postponed. He suffered a stroke as of this summer, and was seen by my colleague Ronnie Boyd. His sleep study clearly indicated obstructive sleep apnea with an apnea index of 28.6. His RDI was 55.6 much higher. This indicates that the patient suffers an apnea every second minute of sleep. He was surprised to learn this. Since he has also upper airway resistance the I recommended CPAP use.  I would like for the patient to start with a dream station machine and use a small nasal mask at night. It should be left to him if he wants to try pillows for a mask that covers his nose. [facility] is his DME.     The patient was advised of the nature of the diagnosed sleep disorder , the treatment options and risks for general a health and wellness arising from not treating the condition. Visit duration was 25 minutes.  More than 50% of the face to face time is used in discussion of the results of the recent tests, the best treatment  options for Mr. Draheim and the goal of preventing a new stroke in the future.  CPAP was deemed to be the most appropriate treatment. He did not feel necessarily comfortable  using anasal mask during his sleep study,  I would like for the durable medical equipment company to fit him this different options and see what he feels is most comfortable choice.  I explained that a 4 hour daily compliance is symmetric and normal required by Medicare. I would appreciate if the patient tries to sleep throughout the night with his machine.  Plan:  Treatment plan and additional workup :  The patient should start using CPAP, dream station and trial of nasal mask versus nasal pillow. Please let patient try on.  He was advised that he has 30 days to return supplies His DME should visit him at home. Rv in 3 month with compliance data.    Porfirio Mylar Maiko Salais MD  05/23/2015

## 2015-05-23 NOTE — Patient Instructions (Signed)
Mr. Carattini is to start using CPAP prior to undergoing an angioplasty with stent placement. At our last meeting we still assumed that he may have  bypass surgery but this has notturned out not to be necessary. I will send his order to [facility]. We will use of 5-10 cm outer titration setting. The durable medical equipment Company is asked to fit the patient was a comfortable mask of his choice. Rv after 90 days of CPAP use.

## 2015-05-23 NOTE — Telephone Encounter (Signed)
Pt was 16 minutes late for his appt. 11:15 appt and arrived at 11:31. Dr. Vickey Huger agreed to see him.

## 2015-05-24 ENCOUNTER — Other Ambulatory Visit (INDEPENDENT_AMBULATORY_CARE_PROVIDER_SITE_OTHER): Payer: Medicare Other | Admitting: *Deleted

## 2015-05-24 DIAGNOSIS — I2510 Atherosclerotic heart disease of native coronary artery without angina pectoris: Secondary | ICD-10-CM | POA: Diagnosis not present

## 2015-05-24 DIAGNOSIS — Z01812 Encounter for preprocedural laboratory examination: Secondary | ICD-10-CM | POA: Diagnosis not present

## 2015-05-24 LAB — CBC WITH DIFFERENTIAL/PLATELET
BASOS PCT: 0.5 % (ref 0.0–3.0)
Basophils Absolute: 0 10*3/uL (ref 0.0–0.1)
Eosinophils Absolute: 0.2 10*3/uL (ref 0.0–0.7)
Eosinophils Relative: 4 % (ref 0.0–5.0)
HCT: 40.8 % (ref 39.0–52.0)
Hemoglobin: 13 g/dL (ref 13.0–17.0)
Lymphocytes Relative: 24.3 % (ref 12.0–46.0)
Lymphs Abs: 1.2 10*3/uL (ref 0.7–4.0)
MCHC: 32 g/dL (ref 30.0–36.0)
MCV: 81.1 fl (ref 78.0–100.0)
MONO ABS: 1 10*3/uL (ref 0.1–1.0)
Monocytes Relative: 19.5 % — ABNORMAL HIGH (ref 3.0–12.0)
Neutro Abs: 2.6 10*3/uL (ref 1.4–7.7)
Neutrophils Relative %: 51.7 % (ref 43.0–77.0)
Platelets: 164 10*3/uL (ref 150.0–400.0)
RBC: 5.03 Mil/uL (ref 4.22–5.81)
RDW: 16.8 % — ABNORMAL HIGH (ref 11.5–15.5)
WBC: 5 10*3/uL (ref 4.0–10.5)

## 2015-05-24 LAB — BASIC METABOLIC PANEL
BUN: 18 mg/dL (ref 6–23)
CALCIUM: 8.9 mg/dL (ref 8.4–10.5)
CO2: 29 mEq/L (ref 19–32)
Chloride: 104 mEq/L (ref 96–112)
Creatinine, Ser: 1.43 mg/dL (ref 0.40–1.50)
GFR: 60.33 mL/min (ref >60.00)
Glucose, Bld: 96 mg/dL (ref 70–99)
Potassium: 4.6 mEq/L (ref 3.5–5.1)
Sodium: 137 mEq/L (ref 135–145)

## 2015-05-24 LAB — PROTIME-INR
INR: 1.2 ratio — ABNORMAL HIGH (ref 0.8–1.0)
PROTHROMBIN TIME: 12.9 s (ref 9.6–13.1)

## 2015-05-26 ENCOUNTER — Ambulatory Visit: Payer: Medicare Other | Admitting: Neurology

## 2015-05-26 ENCOUNTER — Telehealth: Payer: Self-pay | Admitting: Cardiovascular Disease

## 2015-05-26 ENCOUNTER — Other Ambulatory Visit: Payer: Medicare Other

## 2015-05-26 NOTE — Telephone Encounter (Signed)
Patient and his wife called about his instruction letter for his up coming PCI. Went over Letter that [employer] sent to the patient's address and went over instructions. Patient and wife verbalized understanding and will call office with any other questions or concerns.

## 2015-05-26 NOTE — Telephone Encounter (Signed)
New Message  Pt request a call back to discuss PCI.

## 2015-06-01 ENCOUNTER — Other Ambulatory Visit: Payer: Self-pay

## 2015-06-01 ENCOUNTER — Ambulatory Visit (HOSPITAL_COMMUNITY)
Admission: RE | Admit: 2015-06-01 | Discharge: 2015-06-02 | Disposition: A | Discharge status: Home / Self Care | Payer: Medicare Other | Source: Ambulatory Visit | Attending: Cardiovascular Disease | Admitting: Cardiovascular Disease

## 2015-06-01 ENCOUNTER — Encounter (HOSPITAL_COMMUNITY)
Admission: RE | Disposition: A | Discharge status: Home / Self Care | Payer: Medicare Other | Source: Ambulatory Visit | Attending: Cardiovascular Disease

## 2015-06-01 ENCOUNTER — Encounter (HOSPITAL_COMMUNITY): Payer: Self-pay | Admitting: Cardiovascular Disease

## 2015-06-01 DIAGNOSIS — Z955 Presence of coronary angioplasty implant and graft: Secondary | ICD-10-CM

## 2015-06-01 DIAGNOSIS — Z8673 Personal history of transient ischemic attack (TIA), and cerebral infarction without residual deficits: Secondary | ICD-10-CM | POA: Insufficient documentation

## 2015-06-01 DIAGNOSIS — I2584 Coronary atherosclerosis due to calcified coronary lesion: Secondary | ICD-10-CM | POA: Diagnosis not present

## 2015-06-01 DIAGNOSIS — I10 Essential (primary) hypertension: Secondary | ICD-10-CM | POA: Insufficient documentation

## 2015-06-01 DIAGNOSIS — I25118 Atherosclerotic heart disease of native coronary artery with other forms of angina pectoris: Secondary | ICD-10-CM | POA: Diagnosis present

## 2015-06-01 DIAGNOSIS — I639 Cerebral infarction, unspecified: Secondary | ICD-10-CM

## 2015-06-01 DIAGNOSIS — Z7982 Long term (current) use of aspirin: Secondary | ICD-10-CM | POA: Insufficient documentation

## 2015-06-01 DIAGNOSIS — Z7902 Long term (current) use of antithrombotics/antiplatelets: Secondary | ICD-10-CM | POA: Diagnosis not present

## 2015-06-01 DIAGNOSIS — I739 Peripheral vascular disease, unspecified: Secondary | ICD-10-CM | POA: Insufficient documentation

## 2015-06-01 DIAGNOSIS — E7800 Pure hypercholesterolemia, unspecified: Secondary | ICD-10-CM | POA: Diagnosis present

## 2015-06-01 DIAGNOSIS — K219 Gastro-esophageal reflux disease without esophagitis: Secondary | ICD-10-CM | POA: Diagnosis not present

## 2015-06-01 DIAGNOSIS — E785 Hyperlipidemia, unspecified: Secondary | ICD-10-CM | POA: Diagnosis not present

## 2015-06-01 DIAGNOSIS — I6381 Other cerebral infarction due to occlusion or stenosis of small artery: Secondary | ICD-10-CM

## 2015-06-01 DIAGNOSIS — E119 Type 2 diabetes mellitus without complications: Secondary | ICD-10-CM | POA: Diagnosis not present

## 2015-06-01 DIAGNOSIS — G4733 Obstructive sleep apnea (adult) (pediatric): Secondary | ICD-10-CM | POA: Insufficient documentation

## 2015-06-01 DIAGNOSIS — I350 Nonrheumatic aortic (valve) stenosis: Secondary | ICD-10-CM | POA: Diagnosis not present

## 2015-06-01 DIAGNOSIS — I208 Other forms of angina pectoris: Secondary | ICD-10-CM | POA: Diagnosis present

## 2015-06-01 HISTORY — DX: Dependence on other enabling machines and devices: Z99.89

## 2015-06-01 HISTORY — PX: CARDIAC CATHETERIZATION: SHX172

## 2015-06-01 HISTORY — DX: Obstructive sleep apnea (adult) (pediatric): G47.33

## 2015-06-01 HISTORY — DX: Cerebral infarction, unspecified: I63.9

## 2015-06-01 HISTORY — DX: Family history of other specified conditions: Z84.89

## 2015-06-01 HISTORY — DX: Personal history of other diseases of the digestive system: Z87.19

## 2015-06-01 HISTORY — DX: Prediabetes: R73.03

## 2015-06-01 LAB — POCT ACTIVATED CLOTTING TIME: Activated Clotting Time: 595 seconds

## 2015-06-01 SURGERY — CORONARY STENT INTERVENTION
Anesthesia: LOCAL

## 2015-06-01 MED ORDER — PANTOPRAZOLE SODIUM 40 MG PO TBEC
40.0000 mg | DELAYED_RELEASE_TABLET | Freq: Every day | ORAL | Status: DC
  Administered 2015-06-01: 40 mg via ORAL
  Filled 2015-06-01: qty 1

## 2015-06-01 MED ORDER — HEPARIN (PORCINE) IN NACL 2-0.9 UNIT/ML-% IJ SOLN
INTRAMUSCULAR | Status: AC
  Filled 2015-06-01: qty 1500

## 2015-06-01 MED ORDER — ASPIRIN EC 81 MG PO TBEC
81.0000 mg | DELAYED_RELEASE_TABLET | Freq: Every day | ORAL | Status: DC
  Administered 2015-06-02: 81 mg via ORAL
  Filled 2015-06-01: qty 1

## 2015-06-01 MED ORDER — SODIUM CHLORIDE 0.9 % IV SOLN
1.0000 mg/kg/h | INTRAVENOUS | Status: AC
  Administered 2015-06-01: 1 mg/kg/h via INTRAVENOUS
  Filled 2015-06-01: qty 250

## 2015-06-01 MED ORDER — CENTRUM SILVER PO TABS: 1.0000 | ORAL_TABLET | Freq: Every day | ORAL | Status: DC

## 2015-06-01 MED ORDER — ISOSORBIDE MONONITRATE ER 60 MG PO TB24
60.0000 mg | ORAL_TABLET | Freq: Every day | ORAL | Status: DC
  Administered 2015-06-02: 60 mg via ORAL
  Filled 2015-06-01: qty 1

## 2015-06-01 MED ORDER — SODIUM CHLORIDE 0.9 % IJ SOLN: 3.0000 mL | INTRAMUSCULAR | Status: DC | PRN

## 2015-06-01 MED ORDER — MIDAZOLAM HCL 2 MG/2ML IJ SOLN
INTRAMUSCULAR | Status: DC | PRN
  Administered 2015-06-01: 1 mg via INTRAVENOUS

## 2015-06-01 MED ORDER — ASPIRIN 81 MG PO CHEW: 81.0000 mg | CHEWABLE_TABLET | ORAL | Status: DC

## 2015-06-01 MED ORDER — CLOPIDOGREL BISULFATE 300 MG PO TABS
ORAL_TABLET | ORAL | Status: AC
  Filled 2015-06-01: qty 1

## 2015-06-01 MED ORDER — GARLIC OIL 1000 MG PO CAPS: 1.0000 | ORAL_CAPSULE | Freq: Every day | ORAL | Status: DC

## 2015-06-01 MED ORDER — AZELASTINE-FLUTICASONE 137-50 MCG/ACT NA SUSP
1.0000 | Freq: Every day | NASAL | Status: DC | PRN
Start: 2015-06-01 — End: 2015-06-02

## 2015-06-01 MED ORDER — POTASSIUM CHLORIDE CRYS ER 20 MEQ PO TBCR
20.0000 meq | EXTENDED_RELEASE_TABLET | Freq: Every day | ORAL | Status: DC
  Administered 2015-06-02: 20 meq via ORAL
  Filled 2015-06-01: qty 1

## 2015-06-01 MED ORDER — ONDANSETRON HCL 4 MG/2ML IJ SOLN: 4.0000 mg | Freq: Four times a day (QID) | INTRAMUSCULAR | Status: DC | PRN

## 2015-06-01 MED ORDER — ACETAMINOPHEN 325 MG PO TABS: 650.0000 mg | ORAL_TABLET | ORAL | Status: DC | PRN

## 2015-06-01 MED ORDER — ADULT MULTIVITAMIN W/MINERALS CH
1.0000 | ORAL_TABLET | Freq: Every day | ORAL | Status: DC
  Administered 2015-06-02: 10:00:00 1 via ORAL
  Filled 2015-06-01: qty 1

## 2015-06-01 MED ORDER — VERAPAMIL HCL 2.5 MG/ML IV SOLN
INTRAVENOUS | Status: AC
  Filled 2015-06-01: qty 2

## 2015-06-01 MED ORDER — SODIUM CHLORIDE 0.9 % IV SOLN
250.0000 mg | INTRAVENOUS | Status: DC | PRN
  Administered 2015-06-01: 1.75 mg/kg/h via INTRAVENOUS

## 2015-06-01 MED ORDER — AMLODIPINE BESYLATE 5 MG PO TABS
10.0000 mg | ORAL_TABLET | Freq: Every day | ORAL | Status: DC
  Administered 2015-06-02: 10 mg via ORAL
  Filled 2015-06-01: qty 2

## 2015-06-01 MED ORDER — ANGIOPLASTY BOOK
Freq: Once | Status: AC
  Administered 2015-06-01: 22:00:00
  Filled 2015-06-01: qty 1

## 2015-06-01 MED ORDER — METOPROLOL TARTRATE 12.5 MG HALF TABLET: 25.0000 mg | ORAL_TABLET | Freq: Two times a day (BID) | ORAL | Status: DC

## 2015-06-01 MED ORDER — FENTANYL CITRATE (PF) 100 MCG/2ML IJ SOLN
INTRAMUSCULAR | Status: DC | PRN
  Administered 2015-06-01: 25 ug via INTRAVENOUS

## 2015-06-01 MED ORDER — MIDAZOLAM HCL 2 MG/2ML IJ SOLN
INTRAMUSCULAR | Status: AC
  Filled 2015-06-01: qty 4

## 2015-06-01 MED ORDER — LIDOCAINE HCL (PF) 1 % IJ SOLN
INTRAMUSCULAR | Status: AC
  Filled 2015-06-01: qty 30

## 2015-06-01 MED ORDER — ALUM & MAG HYDROXIDE-SIMETH 200-200-20 MG/5ML PO SUSP: 15.0000 mL | Freq: Four times a day (QID) | ORAL | Status: DC | PRN

## 2015-06-01 MED ORDER — BIVALIRUDIN BOLUS VIA INFUSION - CUPID
INTRAVENOUS | Status: DC | PRN
  Administered 2015-06-01: 59.55 mg via INTRAVENOUS

## 2015-06-01 MED ORDER — SODIUM CHLORIDE 0.9 % IJ SOLN: 3.0000 mL | Freq: Two times a day (BID) | INTRAMUSCULAR | Status: DC

## 2015-06-01 MED ORDER — SODIUM CHLORIDE 0.9 % IV SOLN: 250.0000 mL | INTRAVENOUS | Status: DC | PRN

## 2015-06-01 MED ORDER — CLOPIDOGREL BISULFATE 75 MG PO TABS: 75.0000 mg | ORAL_TABLET | ORAL | Status: DC

## 2015-06-01 MED ORDER — PRAVASTATIN SODIUM 40 MG PO TABS
40.0000 mg | ORAL_TABLET | Freq: Every evening | ORAL | Status: DC
Start: 2015-06-01 — End: 2015-06-02
  Administered 2015-06-01: 40 mg via ORAL
  Filled 2015-06-01: qty 1

## 2015-06-01 MED ORDER — BIVALIRUDIN 250 MG IV SOLR
INTRAVENOUS | Status: AC
  Filled 2015-06-01: qty 250

## 2015-06-01 MED ORDER — METOPROLOL TARTRATE 12.5 MG HALF TABLET
25.0000 mg | ORAL_TABLET | Freq: Two times a day (BID) | ORAL | Status: DC
  Administered 2015-06-01 – 2015-06-02 (×2): 25 mg via ORAL
  Filled 2015-06-01 (×2): qty 2

## 2015-06-01 MED ORDER — CLOPIDOGREL BISULFATE 300 MG PO TABS
ORAL_TABLET | ORAL | Status: DC | PRN
  Administered 2015-06-01: 300 mg via ORAL

## 2015-06-01 MED ORDER — HYDROCHLOROTHIAZIDE 25 MG PO TABS
25.0000 mg | ORAL_TABLET | Freq: Every day | ORAL | Status: DC
  Filled 2015-06-01: qty 1

## 2015-06-01 MED ORDER — IOHEXOL 350 MG/ML SOLN
INTRAVENOUS | Status: DC | PRN
  Administered 2015-06-01: 195 mL via INTRA_ARTERIAL

## 2015-06-01 MED ORDER — TAMSULOSIN HCL 0.4 MG PO CAPS
0.4000 mg | ORAL_CAPSULE | Freq: Every day | ORAL | Status: DC
  Administered 2015-06-01: 20:00:00 0.4 mg via ORAL
  Filled 2015-06-01: qty 1

## 2015-06-01 MED ORDER — FENTANYL CITRATE (PF) 100 MCG/2ML IJ SOLN
INTRAMUSCULAR | Status: AC
  Filled 2015-06-01: qty 4

## 2015-06-01 MED ORDER — IRBESARTAN 300 MG PO TABS
300.0000 mg | ORAL_TABLET | Freq: Every day | ORAL | Status: DC
  Filled 2015-06-01: qty 1

## 2015-06-01 MED ORDER — NITROGLYCERIN 1 MG/10 ML FOR IR/CATH LAB
INTRA_ARTERIAL | Status: AC
  Filled 2015-06-01: qty 10

## 2015-06-01 MED ORDER — TELMISARTAN-HCTZ 80-25 MG PO TABS: 1.0000 | ORAL_TABLET | Freq: Every day | ORAL | Status: DC

## 2015-06-01 MED ORDER — SODIUM CHLORIDE 0.9 % WEIGHT BASED INFUSION
3.0000 mL/kg/h | INTRAVENOUS | Status: DC
Start: 2015-06-02 — End: 2015-06-01
  Administered 2015-06-01: 3 mL/kg/h via INTRAVENOUS

## 2015-06-01 MED ORDER — SODIUM CHLORIDE 0.9 % IV SOLN: INTRAVENOUS | Status: DC

## 2015-06-01 MED ORDER — SODIUM CHLORIDE 0.9 % WEIGHT BASED INFUSION
1.0000 mL/kg/h | INTRAVENOUS | Status: DC
  Administered 2015-06-01: 1 mL/kg/h via INTRAVENOUS

## 2015-06-01 MED ORDER — COENZYME Q10 200 MG PO CAPS: 200.0000 mg | ORAL_CAPSULE | Freq: Every day | ORAL | Status: DC

## 2015-06-01 MED ORDER — GUAIFENESIN ER 600 MG PO TB12
1200.0000 mg | ORAL_TABLET | Freq: Two times a day (BID) | ORAL | Status: DC | PRN
  Filled 2015-06-01: qty 2

## 2015-06-01 MED ORDER — LORATADINE 10 MG PO TABS: 10.0000 mg | ORAL_TABLET | Freq: Every day | ORAL | Status: DC | PRN

## 2015-06-01 MED ORDER — CLOPIDOGREL BISULFATE 75 MG PO TABS
75.0000 mg | ORAL_TABLET | Freq: Every day | ORAL | Status: DC
  Administered 2015-06-02: 10:00:00 75 mg via ORAL
  Filled 2015-06-01: qty 1

## 2015-06-01 SURGICAL SUPPLY — 19 items
BALLN EUPHORA RX 2.0X15 (BALLOONS) ×2
BALLN ~~LOC~~ TREK RX 2.5X15 (BALLOONS) ×2
BALLN ~~LOC~~ TREK RX 2.75X12 (BALLOONS) ×2
BALLN ~~LOC~~ TREK RX 3.0X12 (BALLOONS) ×2
BALLOON EUPHORA RX 2.0X15 (BALLOONS) IMPLANT
BALLOON ~~LOC~~ TREK RX 2.5X15 (BALLOONS) IMPLANT
BALLOON ~~LOC~~ TREK RX 2.75X12 (BALLOONS) IMPLANT
BALLOON ~~LOC~~ TREK RX 3.0X12 (BALLOONS) IMPLANT
CATH VISTA GUIDE 6FR XBLAD3.5 (CATHETERS) ×1 IMPLANT
GLIDESHEATH SLEND SS 6F .021 (SHEATH) ×1 IMPLANT
KIT ENCORE 26 ADVANTAGE (KITS) ×2 IMPLANT
KIT HEART LEFT (KITS) ×2 IMPLANT
PACK CARDIAC CATHETERIZATION (CUSTOM PROCEDURE TRAY) ×2 IMPLANT
STENT SYNERGY DES 2.25X20 (Permanent Stent) ×2 IMPLANT
STENT SYNERGY DES 2.75X16 (Permanent Stent) ×1 IMPLANT
TRANSDUCER W/STOPCOCK (MISCELLANEOUS) ×2 IMPLANT
TUBING CIL FLEX 10 FLL-RA (TUBING) ×2 IMPLANT
WIRE RUNTHROUGH .014X180CM (WIRE) ×1 IMPLANT
WIRE SAFE-T 1.5MM-J .035X260CM (WIRE) ×1 IMPLANT

## 2015-06-01 NOTE — Progress Notes (Signed)
TR BAND REMOVAL  LOCATION:    right radial  DEFLATED PER PROTOCOL:    Yes.    TIME BAND OFF / DRESSING APPLIED:    1430   SITE UPON ARRIVAL:    Level 0  SITE AFTER BAND REMOVAL:    Level 0  CIRCULATION SENSATION AND MOVEMENT:    Within Normal Limits   Yes.    COMMENTS:   Rechecked site at 1500 and frequently throughout remainder of shift with no bleeding or hematoma noted, dressing dry and intact, CSMs wnls.

## 2015-06-01 NOTE — H&P (View-Only) (Signed)
Patient ID: Ronnie Boyd, male   DOB: 02/12/1927, 81 y.o.   MRN: [id number] PCP: Dr. Alwyn Ren  81 yo with HTN, diet-controlled diabetes, hyperlipidemia, moderate aortic stenosis, CVA, CAD, and PAD presents for cardiology followup.  Patient was admitted in 6/16 with small right medial thalamic/mid brain lacunar infarct manifesting with balance difficulty and slight drooping of the right eyelid.  Strokes signs completely resolved with no residual defect.  He had a carotid US in 6/16 without significant stenosis.    Patient also had been noting tightness in his chest with moderate exertion (yardwork, carrying a load).  Echo showed normal EF and moderate AS in 6/16.  He had an abnormal Cardiolite which was followed by LHC in 7/16 showing significant 3 vessel disease.  He was started on Imdur and metoprolol was increased.  He was referred to cardiac surgery for evaluation as CABG was thought to be most likely to provide complete revascularization in this situation.   He saw Dr Dorris Fetch.  After discussion with neurology, it was decided that as long as he remains symptomatically stable, he should not have CABG until 3 months after CVA (which would be in the latter half of September).   Currently, he is doing well.  He has not been doing any strenuous activity and has not had any further chest pain.  No exertional dyspnea.  He had OSA on sleep study and needs to be set up for CPAP. He also reports bilateral calf pain with ambulation (walking about 100 feet) => he had peripheral arterial dopplers with below-the-knee disease, plan to manage medically. His Micardis was decreased to 40/12.5 because of episodes of SBP in 90s.  It is running high today.  His home BP log shows fairly wide variation in BP.    Labs (3/12): K 3.9, creatinine 1.0, TSH normal, LDL 150, HDL 76 Labs (6/12): LFTs normal, LDL 91, HDL 71 Labs (8/12): LDL 62, HDL 75 LBS (10/12): K 3.6, creatinine 1.1 Labs (5/13): LDL 75, HDL 73 Labs (10/13):  K 3.7, creatinine 1.1 Labs (5/14): LDL 81, HDL 76, K 3.7, creatinine 1.1 Labs (7/16): K 3.7, creatinine 1.27, LDL 74, LDL-P 724  PMH: 1. HTN 2. Hyperlipidemia: Myalgias with Crestor.  3. Diabetes mellitus: diet-controlled 4. Allergic rhinitis 5. CAD: Myoview (4/05) with no ischemia or infarction.  LHC (7/16) with 60% pLAD, 80% mLAD, 80-90% dLAD, 70+% proximal LCx after high OM, 90% proximal high OM with diffuse severe distal disease, nondominant RCA with 80% ostial stenosis, 90% proximal stenosis, and 90% mid vessel stenosis.  6. PAD: ABIs 2005 with 0.92 on right, 0.78 on left.  Peripheral arterial evaluation (5/12): ABI 0.82 right/0.79 left, TBI 0.69 right/0.54 left (not at risk of tissue loss).  ABIs (12/13) with 0.84 right, 0.97 left (improved).  Peripheral arterial dopplers (8/16) with ABI 0.76 on right, 0.73 on left => occluded bilateral anterior tibial arteries and left posterior tibial artery.  7. GERD 8. Prostatitis 9. S/p inguinal hernia repair. 10. Aortic stenosis: Moderate by echo.  Echo (3/12) with mild LVH, mild to moderate focal basal septal hypertrophy with no LV outflow tract gradient, mild aortic stenosis, normal RV size and systolic function.  Echo (6/16) with EF 65-70%, moderate AS with mean gradient 28 mmHg, AVA 1.12 cm^2.  11. CVA (6/16): Right medial thalamic/midbrain lacunar infarct.  6/16 carotid dopplers without significant stenosis.  12. OSA: CPAP recommended.   SH: Retired, married, never smoked.    FH: Father with CVA in his 98s, no premature  CAD, no AAA  ROS: All systems reviewed and negative except as per HPI.    Current Outpatient Prescriptions  Medication Sig Dispense Refill  . alum & mag hydroxide-simeth (MAALOX/MYLANTA) 200-200-20 MG/5ML suspension Take 15 mLs by mouth every 6 (six) hours as needed for indigestion or heartburn.    Marland Kitchen amLODipine (NORVASC) 10 MG tablet Take 1 tablet (10 mg total) by mouth daily. 90 tablet 3  . aspirin EC 81 MG tablet Take  81 mg by mouth daily.    . Azelastine-Fluticasone (DYMISTA) 137-50 MCG/ACT SUSP Place 1 spray into the nose daily as needed (allergies).    . clopidogrel (PLAVIX) 75 MG tablet Take 1 tablet (75 mg total) by mouth daily. 30 tablet 11  . Coenzyme Q10 200 MG capsule Take 200 mg by mouth daily. Take one daily    . Garlic Oil 1000 MG CAPS Take 1 capsule by mouth daily.      Marland Kitchen guaiFENesin (MUCINEX) 600 MG 12 hr tablet Take 1,200 mg by mouth 2 (two) times daily as needed for to loosen phlegm. With plenty of fluids    . isosorbide mononitrate (IMDUR) 60 MG 24 hr tablet Take 60 mg by mouth daily.    Marland Kitchen loratadine (CLARITIN) 10 MG tablet Take 10 mg by mouth daily as needed for rhinitis.     . metoprolol tartrate (LOPRESSOR) 25 MG tablet Take 25 mg by mouth 2 (two) times daily.    . Multiple Vitamins-Minerals (CENTRUM SILVER) tablet Take 1 tablet by mouth daily.      . Omega-3 Fatty Acids (FISH OIL) 1000 MG CAPS Take 1 capsule by mouth daily.      . pantoprazole (PROTONIX) 40 MG tablet Take 1 tablet (40 mg total) by mouth daily. 30 tablet 3  . potassium chloride SA (KLOR-CON M20) 20 MEQ tablet Take 1 tablet (20 mEq total) by mouth daily. 90 tablet 3  . pravastatin (PRAVACHOL) 40 MG tablet Take 1 tablet (40 mg total) by mouth every evening. 90 tablet 1  . telmisartan-hydrochlorothiazide (MICARDIS HCT) 80-25 MG per tablet Take 1/2 tablet by mouth daily 90 tablet 3   No current facility-administered medications for this visit.    BP 164/78 mmHg  Pulse 64  Ht  (1.626 m)  Wt 175 lb (79.379 kg)  BMI 30.02 kg/m2 General: NAD Neck: JVP 7 cm, no thyromegaly or thyroid nodule.  Lungs: Clear to auscultation bilaterally with normal respiratory effort. CV: Nondisplaced PMI.  Heart regular S1/S2, no S3/S4, 3/6 systolic crescendo-decrescendo murmur RUSB with clear S2 and radiation of murmur to neck.  Trace ankle edema.  1+ left PT pulse, difficult to palpate R PT pulse.  Abdomen: Soft, nontender, no  hepatosplenomegaly, no distention.  Neurologic: Alert and oriented x 3.  Psych: Normal affect. Extremities: No clubbing or cyanosis.   Assessment/Plan: 1. CAD: Diffuse severe 3 vessel disease.  He had angina with moderate to heavy exertion.  He has been started on Imdur and metoprolol was increased.  Since then, he has had no further chest tightness but he has not been exerting himself heavily.  Most complete revascularization would be CABG.  Mr Fawbush saw Dr Kirke Corin for PV.  I reviewed his cath films with Dr Kirke Corin also, and it looks like we could intervene successfully on his LAD and LCx with medical treatment of the RCA and ramus.  As his targets are not particularly good for CABG, this may be the better choice.   - Continue Imdur, metoprolol, pravastatin, ASA 81,  and Plavix 75 daily.  - I will arrange PCI with Dr Kirke Corin.  - He understands the need to call EMS/go to the hospital immediately with any unstable symptoms.  2. Aortic stenosis: Moderate on recent echo.  He would likely be a TAVR candidate in future if AS worsens.   3. CVA: 6/16 lacunar CVA, followed by neurology, on Plavix/ASA.  4. PAD: Bilateral calf pain after walking about 100 feet. Extensive below-knee PAD.  Medical management.  Continue statin, Plavix, and ASA.   5. Hyperlipidemia: Continue pravastatin 40 mg daily.  Good lipids in 7/16.  6. HTN: BP was low so Micardis was cut in half. BP is varying a lot, I will have him continue to keep track of it in his log book.   7. OSA: Needs to start CPAP.    Marca Ancona 05/22/2015 11:49 PM

## 2015-06-01 NOTE — Interval H&P Note (Signed)
Cath Lab Visit (complete for each Cath Lab visit)  Clinical Evaluation Leading to the Procedure:   ACS: No.  Non-ACS:    Anginal Classification: CCS II  Anti-ischemic medical therapy: Maximal Therapy (2 or more classes of medications)  Non-Invasive Test Results: No non-invasive testing performed  Prior CABG: No previous CABG      History and Physical Interval Note:  06/01/2015 8:59 AM  Ronnie Boyd  has presented today for surgery, with the diagnosis of cad  The various methods of treatment have been discussed with the patient and family. After consideration of risks, benefits and other options for treatment, the patient has consented to  Procedure(s): Coronary Stent Intervention ([name]) as a surgical intervention .  The patient's history has been reviewed, patient examined, no change in status, stable for surgery.  I have reviewed the patient's chart and labs.  Questions were answered to the patient's satisfaction.     Lorine Bears

## 2015-06-02 DIAGNOSIS — E780 Pure hypercholesterolemia: Secondary | ICD-10-CM | POA: Diagnosis not present

## 2015-06-02 DIAGNOSIS — I2584 Coronary atherosclerosis due to calcified coronary lesion: Secondary | ICD-10-CM | POA: Diagnosis not present

## 2015-06-02 DIAGNOSIS — I350 Nonrheumatic aortic (valve) stenosis: Secondary | ICD-10-CM | POA: Diagnosis not present

## 2015-06-02 DIAGNOSIS — I10 Essential (primary) hypertension: Secondary | ICD-10-CM | POA: Diagnosis not present

## 2015-06-02 DIAGNOSIS — I208 Other forms of angina pectoris: Secondary | ICD-10-CM

## 2015-06-02 DIAGNOSIS — I25118 Atherosclerotic heart disease of native coronary artery with other forms of angina pectoris: Secondary | ICD-10-CM | POA: Diagnosis not present

## 2015-06-02 LAB — BASIC METABOLIC PANEL
Anion gap: 3 — ABNORMAL LOW (ref 5–15)
BUN: 16 mg/dL (ref 6–20)
CHLORIDE: 106 mmol/L (ref 101–111)
CO2: 28 mmol/L (ref 22–32)
Calcium: 8.7 mg/dL — ABNORMAL LOW (ref 8.9–10.3)
Creatinine, Ser: 1.33 mg/dL — ABNORMAL HIGH (ref 0.61–1.24)
GFR calc non Af Amer: 47 mL/min — ABNORMAL LOW (ref >60)
GFR, EST AFRICAN AMERICAN: 55 mL/min — AB (ref >60)
Glucose, Bld: 111 mg/dL — ABNORMAL HIGH (ref 65–99)
POTASSIUM: 4.5 mmol/L (ref 3.5–5.1)
SODIUM: 137 mmol/L (ref 135–145)

## 2015-06-02 LAB — CBC
HEMATOCRIT: 42.8 % (ref 39.0–52.0)
HEMOGLOBIN: 13.8 g/dL (ref 13.0–17.0)
MCH: 26 pg (ref 26.0–34.0)
MCHC: 32.2 g/dL (ref 30.0–36.0)
MCV: 80.8 fL (ref 78.0–100.0)
Platelets: 158 10*3/uL (ref 150–400)
RBC: 5.3 MIL/uL (ref 4.22–5.81)
RDW: 16.1 % — ABNORMAL HIGH (ref 11.5–15.5)
WBC: 5.1 10*3/uL (ref 4.0–10.5)

## 2015-06-02 MED ORDER — NITROGLYCERIN 0.4 MG SL SUBL
0.4000 mg | SUBLINGUAL_TABLET | SUBLINGUAL | Status: DC | PRN
Start: 2015-06-02 — End: 2016-08-14

## 2015-06-02 MED FILL — Verapamil HCl IV Soln 2.5 MG/ML: INTRAVENOUS | Qty: 2 | Status: AC

## 2015-06-02 MED FILL — Heparin Sodium (Porcine) 2 Unit/ML in Sodium Chloride 0.9%: INTRAMUSCULAR | Qty: 1500 | Status: AC

## 2015-06-02 MED FILL — Nitroglycerin IV Soln 100 MCG/ML in D5W: INTRA_ARTERIAL | Qty: 10 | Status: AC

## 2015-06-02 MED FILL — Lidocaine HCl Local Preservative Free (PF) Inj 1%: INTRAMUSCULAR | Qty: 30 | Status: AC

## 2015-06-02 NOTE — Discharge Summary (Signed)
Discharge Summary   Patient ID: Ronnie Boyd,  MRN: [id number], DOB/AGE: [birth year] 79 y.o.  Admit date: 06/01/2015 Discharge date: 06/02/2015  Primary Care Provider: Rene Paci Primary Cardiologist: Dr. Shirlee Latch  Discharge Diagnoses Principal Problem:   Effort angina Active Problems:   HYPERCHOLESTEROLEMIA   Essential hypertension   Peripheral vascular disease   Coronary artery disease involving native coronary artery with other forms of angina pectoris   Allergies No Known Allergies  Procedures  Cardiac catheterization 06/01/2015 Conclusion     Dist LAD lesion, 90% stenosed. There is a 0% residual stenosis post intervention.  Mid LAD lesion, 80% stenosed. There is a 0% residual stenosis post intervention.  A drug-eluting stent was placed.  1st Mrg lesion, 90% stenosed. There is a 0% residual stenosis post intervention.  A drug-eluting stent was placed.  Prox Cx to Mid Cx lesion, 60% stenosed.  1. Successful angioplasty and Synergy biodegradable stent placement to the distal and mid LAD and high OM1. 2. The stenosis in the proximal left circumflex was noted to be around 60% and thus no PCI was performed on this.  Recommendations: Synergy stents were chosen due to calcifications and distal disease in order to be able to deliver the stent. Recommend dual antiplatelets therapy for at least 1 year and aggressive treatment of risk factors.      Hospital Course  The patient is a 81 year old male with HTN, diet-controlled diabetes, hyperlipidemia, moderate aortic stenosis, CVA, CAD, and PAD who presented to [facility] on 06/01/2015 for outpatient cardiac catheterization. He had a stroke in June 2016 with small right medial thalamic/midbrain lacunar infarct manifested with balance difficulty and the slight drooping of the right eyelid. He originally had abnormal Cardiolite followed by left heart cath in July 2016 which showed severe three-vessel disease. He  was referred to CT surgery for evaluation for CABG, he saw Dr. Dorris Fetch, after discussing with neurology, it was decided as long as he remains symptomatically stable, he should not have CABG until 3 months after CVA. Since then, he has not done any strenuous activity and has not had further chest discomfort. Dr. Shirlee Latch has review the previous film with Dr. Kirke Corin who felt PCI is a reasonable alternative in this case.   He underwent planned cardiac catheterization on 06/01/2015 which showed 80% mid LAD treated with SYNERGY DES 2.75X16, 90% dist LAD treated with SYNERGY DES 2.25X20, 90% OM1 treated with SYNERGY DES 2.25X20, 60% prix to mid LCx lesion. Postprocedure, he did have some intermittent bradycardia into the high 30s, however this eventually was resolved. He was seen on the following day on 9/22, at which time he denies any significant chest discomfort or shortness breath. His heart rate was in the 50s to 60s. He is deemed stable for discharge from cardiology perspective. I have emphasized the need for compliance with DAPT.   Discharge Vitals Blood pressure 148/57, pulse 67, temperature 98.1 F (36.7 C), temperature source Oral, resp. rate 18, height  (1.626 m), weight 176 lb 5.9 oz (80 kg), SpO2 100 %.  Filed Weights   06/01/15 0648 06/02/15 0528  Weight: 175 lb (79.379 kg) 176 lb 5.9 oz (80 kg)    Labs  CBC  Recent Labs  06/02/15 0326  WBC 5.1  HGB 13.8  HCT 42.8  MCV 80.8  PLT 158   Basic Metabolic Panel  Recent Labs  06/02/15 0326  NA 137  K 4.5  CL 106  CO2 28  GLUCOSE 111*  BUN 16  CREATININE 1.33*  CALCIUM 8.7*    Disposition  Pt is being discharged home today in good condition.  Follow-up Plans & Appointments      Follow-up Information    Follow up with Marca Ancona, MD On 07/04/2015.   Specialty:  Cardiology   Why:  1:15pm. Cardiology followup   Contact information:   [address] [place] [address] [phone]         Follow up with Lorine Bears, MD On 08/30/2015.   Specialty:  Cardiology   Why:  10:30am   Contact information:   [address] [place] [address] [phone]       Discharge Medications    Medication List    TAKE these medications        alum & mag hydroxide-simeth 200-200-20 MG/5ML suspension  Commonly known as:  MAALOX/MYLANTA  Take 15 mLs by mouth every 6 (six) hours as needed for indigestion or heartburn.     amLODipine 10 MG tablet  Commonly known as:  NORVASC  Take 1 tablet (10 mg total) by mouth daily.  Notes to Patient:  Blood pressure and chest pain     aspirin EC 81 MG tablet  Take 81 mg by mouth daily.  Notes to Patient:  Prevents clotting in stent and heart attack     CENTRUM SILVER tablet  Take 1 tablet by mouth daily.     clopidogrel 75 MG tablet  Commonly known as:  PLAVIX  Take 1 tablet (75 mg total) by mouth daily.  Notes to Patient:  Prevents clotting in stent and heart attack     Coenzyme Q10 200 MG capsule  Take 200 mg by mouth daily. Take one daily     DYMISTA 137-50 MCG/ACT Susp  Generic drug:  Azelastine-Fluticasone  Place 1 spray into the nose daily as needed (allergies).  Notes to Patient:  Allergies     Fish Oil 1000 MG Caps  Take 1 capsule by mouth daily.     Garlic Oil 1000 MG Caps  Take 1 capsule by mouth daily.     guaiFENesin 600 MG 12 hr tablet  Commonly known as:  MUCINEX  Take 1,200 mg by mouth 2 (two) times daily as needed for to loosen phlegm. With plenty of fluids     isosorbide mononitrate 60 MG 24 hr tablet  Commonly known as:  IMDUR  Take 60 mg by mouth daily.  Notes to Patient:  Increases blood flow & oxygen to the heart and                            decreases blood pressure     loratadine 10 MG tablet  Commonly known as:  CLARITIN  Take 10 mg by mouth daily as needed for rhinitis.  Notes to Patient:  Rhinitis     metoprolol tartrate 25 MG tablet  Commonly known as:  LOPRESSOR  Take  25 mg by mouth 2 (two) times daily.  Notes to Patient:  Decreases work of the heart, heart rate & blood pressure     nitroGLYCERIN 0.4 MG SL tablet  Commonly known as:  NITROSTAT  Place 1 tablet (0.4 mg total) under the tongue every 5 (five) minutes as needed for chest pain.     pantoprazole 40 MG tablet  Commonly known as:  PROTONIX  Take 1 tablet (40 mg total) by mouth daily.  Notes to Patient:  Heartburn/reflux  potassium chloride SA 20 MEQ tablet  Commonly known as:  KLOR-CON M20  Take 1 tablet (20 mEq total) by mouth daily.  Notes to Patient:  Potassium supplement     pravastatin 40 MG tablet  Commonly known as:  PRAVACHOL  Take 1 tablet (40 mg total) by mouth every evening.  Notes to Patient:  Cholesterol     tamsulosin 0.4 MG Caps capsule  Commonly known as:  FLOMAX  Take 0.4 mg by mouth daily.  Notes to Patient:  Prostate     telmisartan-hydrochlorothiazide 80-25 MG per tablet  Commonly known as:  MICARDIS HCT  Take 1/2 tablet by mouth daily  Notes to Patient:  Blood pressure        Duration of Discharge Encounter   Greater than 30 minutes including physician time.  Ramond Dial PA-C Pager: [phone] 06/02/2015, 2:28 PM

## 2015-06-02 NOTE — Progress Notes (Signed)
Patient Name: Ronnie Boyd Date of Encounter: 06/02/2015  Primary Cardiologist: Dr. Shirlee Latch   Active Problems:   Effort angina   Coronary artery disease involving native coronary artery with other forms of angina pectoris    SUBJECTIVE  Denies any CP or SOB overnight. Feeling well.  CURRENT MEDS . amLODipine  10 mg Oral Daily  . aspirin EC  81 mg Oral Daily  . clopidogrel  75 mg Oral Daily  . irbesartan  300 mg Oral Daily   And  . hydrochlorothiazide  25 mg Oral Daily  . isosorbide mononitrate  60 mg Oral Daily  . metoprolol tartrate  25 mg Oral BID  . multivitamin with minerals  1 tablet Oral Daily  . pantoprazole  40 mg Oral Daily  . potassium chloride SA  20 mEq Oral Daily  . pravastatin  40 mg Oral QPM  . sodium chloride  3 mL Intravenous Q12H  . tamsulosin  0.4 mg Oral Daily    OBJECTIVE  Filed Vitals:   06/01/15 1620 06/01/15 1920 06/02/15 0330 06/02/15 0528  BP: 130/56 147/62 138/64   Pulse:      Temp:  98.6 F (37 C) 98.3 F (36.8 C)   TempSrc:  Oral Oral   Resp: Height:      Weight:    176 lb 5.9 oz (80 kg)  SpO2: 100% 100% 100%     Intake/Output Summary (Last 24 hours) at 06/02/15 0636 Last data filed at 06/02/15 0330  Gross per 24 hour  Intake 1102.5 ml  Output   1925 ml  Net -822.5 ml   Filed Weights   06/01/15 0648 06/02/15 0528  Weight: 175 lb (79.379 kg) 176 lb 5.9 oz (80 kg)    PHYSICAL EXAM  General: Pleasant, NAD. Left eyelid drooping, but no blurry vision, slurring of speech or ipsilateral weakness Neuro: Alert and oriented X 3. Moves all extremities spontaneously. Psych: Normal affect. HEENT:  Normal  Neck: Supple without bruits or JVD. Lungs:  Resp regular and unlabored, CTA. Heart: RRR no s3, s4. R radial cath site stable with 2+ distal radial pulse. 2/6 systolic murmur Abdomen: Soft, non-tender, non-distended, BS + x 4.  Extremities: No clubbing, cyanosis or edema. DP/PT/Radials 2+ and equal  bilaterally.  Accessory Clinical Findings   TELE Sinus brady with HR 50-60s, frequent PVCs    ECG  Sinus brady without significant ST-T wave changes  Echocardiogram 03/09/2015  LV EF: 65% -  70%  ------------------------------------------------------------------- Indications:   CVA (I63.9).  ------------------------------------------------------------------- History:  Risk factors: PVD. GERD. DJD. Hyperglycemia. H/o prostatitis. Hypertension. Dyslipidemia.  ------------------------------------------------------------------- Study Conclusions  - Left ventricle: The cavity size was normal. There was mild concentric hypertrophy. Systolic function was vigorous. The estimated ejection fraction was in the range of 65% to 70%. Wall motion was normal; there were no regional wall motion abnormalities. - Aortic valve: There was moderate stenosis. There was mild regurgitation. Valve area (VTI): 1.12 cm^2. Valve area (Vmax): 1.12 cm^2. Valve area (Vmean): 0.98 cm^2. While there may be a component of subvalvular obstruction, most of the outflow obstruction appears to occur at valve level.     Radiology/Studies  No results found.  ASSESSMENT AND PLAN  81 yo male with PMH of HTN, diet-controlled DM, HLD, CVA, OSA, CAD and PAD presented on 9/21 for diagnostic cardiac catheterization.  1. CAD   - LHC (7/16) with 60% pLAD, 80% mLAD, 80-90% dLAD, 70+% proximal LCx after high OM, 90% proximal  high OM with diffuse severe distal disease, nondominant RCA with 80% ostial stenosis, 90% proximal stenosis, and 90% mid vessel stenosis.   - seen by CT surgery, planned to delay CABG after 3 month after stroke which would be end of September  - Dr. Shirlee Latch reviewed his cath films with Dr Kirke Corin also, and it looks like we could intervene successfully on his LAD and LCx with medical treatment of the RCA and ramus. As his targets are not particularly good for CABG, this may be  the better choice  - cath 06/01/2015 80% mid LAD treated with SYNERGY DES 2.75X16, 90% dist LAD treated with SYNERGY DES 2.25X20, 90% OM1 treated with SYNERGY DES 2.25X20, 60% prix to mid LCx lesion  - continue ASA, plavix, imdur and statin. Also on HCTZ/ARB combo, amlodipine and metoprolol. Discharge today after walk by cardiac rehab  2. Bradycardia: occasional bradycardia after cath into high 30s, resolved. Now mainly in 50-60s, did take PM dose of home metoprolol last night.   3. CVA s/p small R medial thalamic/mid brain lacunar infarct 6/16, neurological deficit resolved  4. Moderate AS  5. HTN  6. HLD  7. diet-controlled DM  8. OSA  9. PAD: Peripheral arterial dopplers (8/16) with ABI 0.76 on right, 0.73 on left => occluded bilateral anterior tibial arteries and left posterior tibial artery.   Ramond Dial PA-C Pager: [phone]    Patient seen and examined. Agree with assessment and plan. Feels well; no chest pain. Reviewed cath data with patient and family.Cath site stable. DC today. F/u Dr. Shirlee Latch.   Lennette Bihari, MD, [facility] 06/02/2015 11:37 AM

## 2015-06-02 NOTE — Discharge Instructions (Signed)
No driving for 24 hours. No lifting over 5 lbs for 1 week. No sexual activity for 1 week. Keep procedure site clean & dry. If you notice increased pain, swelling, bleeding or pus, call/return!  You may shower, but no soaking baths/hot tubs/pools for 1 week.  ° ° °

## 2015-06-02 NOTE — Progress Notes (Signed)
CARDIAC REHAB PHASE I   PRE:  Rate/Rhythm: 68 SR    BP: sitting 131/60    SaO2:   MODE:  Ambulation: 1000 ft   POST:  Rate/Rhythm: 85 SR    BP: sitting 148/57     SaO2:   Tolerated well, gait steady, no c/o. Denies claudication sx now, even with slight Krach this am. Ed completed with pt and wife and daughter. Voiced understanding and requests his referral be sent to [place]. Understands importance of Plavix. [id number]   Elissa Lovett Homeworth CES, ACSM 06/02/2015 9:06 AM

## 2015-06-22 ENCOUNTER — Encounter: Payer: Self-pay | Admitting: Neurology

## 2015-06-22 ENCOUNTER — Other Ambulatory Visit: Payer: Self-pay | Admitting: Cardiology

## 2015-06-22 ENCOUNTER — Ambulatory Visit (INDEPENDENT_AMBULATORY_CARE_PROVIDER_SITE_OTHER): Payer: Medicare Other | Admitting: Neurology

## 2015-06-22 VITALS — BP 138/86 | HR 64 | Resp 16 | Ht 64.0 in | Wt 176.0 lb

## 2015-06-22 DIAGNOSIS — I6990 Unspecified sequelae of unspecified cerebrovascular disease: Secondary | ICD-10-CM | POA: Diagnosis not present

## 2015-06-22 NOTE — Patient Instructions (Signed)
I had a long d/w patient and his wife about his remote stroke, risk for recurrent stroke/TIAs, personally independently reviewed imaging studies and stroke evaluation results and answered questions.Continue aspirin 81 mg orally every day and clopidogrel 75 mg orally every day given his recent cardiac stent for secondary stroke prevention and maintain strict control of hypertension with blood pressure goal below 130/90, diabetes with hemoglobin A1c goal below 6.5% and lipids with LDL cholesterol goal below 100 mg/dL. I also advised the patient to eat a healthy diet with plenty of whole grains, cereals, fruits and vegetables, exercise regularly and maintain ideal body weight .follow-up with Dr. Vickey Hugerohmeier for sleep apnea Followup in the future with me in one year or call earlier if necessary.

## 2015-06-22 NOTE — Progress Notes (Signed)
[name] [address] [place]. [name] [address] [phone]       OFFICE FOLLOW UP VISIT NOTE  [name] Date of Birth:  May 27, 1927 Medical Record Number:  [id number]   Referring MD:  Ellamae Sia  Reason for Referral:  Stroke  HPI: [name] is a pleasant 36 year African-American male who was noted by family to develop excessive sleepiness a few weeks ago. The symptoms lasted for several days before he saw his primary physician who also noticed that he was slightly off balance when walking. The wife and also noticed slight drooping of his right eyelid but the patient denied any diplopia, loss of vision, blurred vision, headache, speech difficulties. Over the last week or so his symptoms have improved and is no longer as sleepy and is walking much better. The patient had an outpatient MRI scan of the brain done on 03/04/15 which have personally reviewed and shows a small acute right medial thalamic/midbrain lacunar infarct. MRA of the brain shows no large vessel and the canal stenosis. Carotid ultrasound done on 03/09/15 reviewed by me shows no significant extracranial stenosis. Patient also had transthoracic echo done which was normal. He had a nuclear medicine scan done 5 admission for chest pain or shortness of breath which was abnormal and is scheduled to undergo cardiac catheterization tomorrow by Dr. Marca Ancona. He has no prior history of strokes, TIAs. He was started recently on aspirin 81 mg daily as well as protocol which has taken only for 4 weeks. He denies smoking or drinking excessive alcohol. There is no family history of strokes. Update 06/22/15 : He returns today for follow-up after last visit 3 months ago. He has had no recurrent stroke or TIA symptoms and is doing well from neurovascular standpoint. He had lipid profile checked 2 months ago and LDL cholesterol was 74 mg percent and hemoglobin A1c was 6.5. He has seen Dr. Vickey Huger and been  diagnosed with sleep apnea and started using CPAP regularly which is helping him. He was found to have occlusive cardiac disease and underwent coronary angioplasty stenting by Dr. Kirke Corin on 06/01/15 and is doing well after that. He is now on aspirin and Plavix and tolerating it well with only minor bruising but no bleeding. His blood pressure is well controlled and today it is 138/86. ROS:   14 system review of systems is positive for   No complaints today   and all other systems negative PMH:  Past Medical History  Diagnosis Date  . Allergic rhinitis   . Hypertension   . PVD (peripheral vascular disease) (HCC)   . Hypercholesteremia   . GERD (gastroesophageal reflux disease)   . Hemorrhoids   . Prostatitis     hx of  . Aortic stenosis     mild echo 7/14  . Acid reflux   . Family history of adverse reaction to anesthesia     "daughter had problems when she was a teen; none since; it was related to asthma"  . Heart murmur   . OSA on CPAP 05/23/2015    "just started wearing mas" (06/01/2015)  . Borderline type 2 diabetes mellitus   . History of hiatal hernia     "dx'd years ago" (06/01/2015)  . Stroke (HCC) 02/09/2015    denies residual on 06/01/2015  . Degenerative joint disease   . Arthritis     "right shoulder" (06/01/2015)    Social History:  Social History   Social History  .  Marital Status: Married    Spouse Name: [name]  . Number of Children: [name]  . Years of Education: [name]   Occupational History  . retired    Social History Main Topics  . Smoking status: Never Smoker   . Smokeless tobacco: Never Used  . Alcohol Use: No  . Drug Use: No  . Sexual Activity: Not Currently   Other Topics Concern  . Not on file   Social History Narrative    Medications:   Current Outpatient Prescriptions on File Prior to Visit  Medication Sig Dispense Refill  . alum & mag hydroxide-simeth (MAALOX/MYLANTA) 200-200-20 MG/5ML suspension Take 15 mLs by mouth every 6 (six) hours as needed  for indigestion or heartburn.    Marland Kitchen amLODipine (NORVASC) 10 MG tablet Take 1 tablet (10 mg total) by mouth daily. 90 tablet 3  . aspirin EC 81 MG tablet Take 81 mg by mouth daily.    . Azelastine-Fluticasone (DYMISTA) 137-50 MCG/ACT SUSP Place 1 spray into the nose daily as needed (allergies).    . clopidogrel (PLAVIX) 75 MG tablet Take 1 tablet (75 mg total) by mouth daily. 30 tablet 11  . Coenzyme Q10 200 MG capsule Take 200 mg by mouth daily. Take one daily    . Garlic Oil 1000 MG CAPS Take 1 capsule by mouth daily.      Marland Kitchen guaiFENesin (MUCINEX) 600 MG 12 hr tablet Take 1,200 mg by mouth 2 (two) times daily as needed for to loosen phlegm. With plenty of fluids    . isosorbide mononitrate (IMDUR) 60 MG 24 hr tablet Take 60 mg by mouth daily.    Marland Kitchen loratadine (CLARITIN) 10 MG tablet Take 10 mg by mouth daily as needed for rhinitis.     . metoprolol tartrate (LOPRESSOR) 25 MG tablet Take 25 mg by mouth 2 (two) times daily.    . Multiple Vitamins-Minerals (CENTRUM SILVER) tablet Take 1 tablet by mouth daily.      . nitroGLYCERIN (NITROSTAT) 0.4 MG SL tablet Place 1 tablet (0.4 mg total) under the tongue every 5 (five) minutes as needed for chest pain. 25 tablet 3  . Omega-3 Fatty Acids (FISH OIL) 1000 MG CAPS Take 1 capsule by mouth daily.      . potassium chloride SA (KLOR-CON M20) 20 MEQ tablet Take 1 tablet (20 mEq total) by mouth daily. 90 tablet 3  . pravastatin (PRAVACHOL) 40 MG tablet Take 1 tablet (40 mg total) by mouth every evening. 90 tablet 1  . tamsulosin (FLOMAX) 0.4 MG CAPS capsule Take 0.4 mg by mouth daily.    Marland Kitchen telmisartan-hydrochlorothiazide (MICARDIS HCT) 80-25 MG per tablet Take 1/2 tablet by mouth daily 90 tablet 3   No current facility-administered medications on file prior to visit.    Allergies:  No Known Allergies  Physical Exam General: Mildly obese elderly African-American male, seated, in no evident distress Head: head normocephalic and atraumatic.   Neck: supple  with no carotid or supraclavicular bruits Cardiovascular: regular rate and rhythm, harsh ejection systolic murmur heard all over the precordium and on the right side of the sternum Musculoskeletal: no deformity Skin:  no rash/petichiae Vascular:  Normal pulses all extremities  Neurologic Exam Mental Status: Awake and fully alert. Oriented to place and time. Recent and remote memory intact. Attention span, concentration and fund of knowledge appropriate. Mood and affect appropriate. Diminished recall 0/3. Animal naming 9. Cranial Nerves: Fundoscopic exam not done . Pupils equal, briskly reactive to light. Extraocular movements full without  nystagmus. There is no eyelid drooping or Horner`s syndrome noted Visual fields full to confrontation. Hearing intact. Facial sensation intact. Face, tongue, palate moves normally and symmetrically.  Motor: Normal bulk and tone. Normal strength in all tested extremity muscles. Sensory.: intact to touch , pinprick , position and vibratory sensation.  Coordination: Rapid alternating movements normal in all extremities. Finger-to-nose and heel-to-shin performed accurately bilaterally. Gait and Station: Arises from chair without difficulty. Stance is normal. Gait demonstrates normal stride length and balance . Able to heel, toe and tandem walk without difficulty.  Reflexes: 1+ and symmetric. Toes downgoing.   NIHSS 0 Modified Rankin  1   ASSESSMENT: [age] year pleasant African-American male with transient episode of excessive daytime sleepiness, drooping of the right eyelid and gait and balance difficulties due to right thalamic/midbrain lacunar infarct likely from small vessel disease with multiple vascular risk factors of hypertension, hyperlipidemia, obesity, age and suspected sleep apnea     PLAN: I had a long d/w patient and his wife about his remote stroke, risk for recurrent stroke/TIAs, personally independently reviewed imaging studies and stroke  evaluation results and answered questions.Continue aspirin 81 mg orally every day and clopidogrel 75 mg orally every day given his recent cardiac stent for secondary stroke prevention and maintain strict control of hypertension with blood pressure goal below 130/90, diabetes with hemoglobin A1c goal below 6.5% and lipids with LDL cholesterol goal below 100 mg/dL. I also advised the patient to eat a healthy diet with plenty of whole grains, cereals, fruits and vegetables, exercise regularly and maintain ideal body weight .follow-up with Dr. Vickey Hugerohmeier for sleep apnea Followup in the future with me in one year or call earlier if necessary.  Delia HeadyPramod Georgi Navarrete, MD  Note: This document was prepared with digital dictation and possible smart phrase technology. Any transcriptional errors that result from this process are unintentional.

## 2015-06-23 ENCOUNTER — Telehealth: Payer: Self-pay

## 2015-06-23 NOTE — Telephone Encounter (Signed)
Noted! Thank you

## 2015-06-23 NOTE — Telephone Encounter (Signed)
Spoke with Mr. Ronnie Boyd regarding his Download. He feels the pressure is blowing too hard. He wears nasal pillows now. He will call me next week for a mask fitting. I explained that a nasal mask could make the difference in him wearing it and the importance of getting him compliant with machine. He is willing to try this. I will let you know what mask I get him fitted for and the  date.

## 2015-06-27 ENCOUNTER — Telehealth: Payer: Self-pay

## 2015-06-27 DIAGNOSIS — Z9989 Dependence on other enabling machines and devices: Principal | ICD-10-CM

## 2015-06-27 DIAGNOSIS — G4733 Obstructive sleep apnea (adult) (pediatric): Secondary | ICD-10-CM

## 2015-06-27 NOTE — Telephone Encounter (Signed)
Mr. Ronnie Boyd came to sleep lab Friday for a mask fitting. He was fitted with F&P Isom nasal mask, size medium. He could not tolerate the nasal pillows. He did well with the nasal mask. I hooked him up to cpap of 5 and then increased him to 8 and he tolerated great with no leaks. Checked download today and he is using 6hrs a night now with minimal leak. I called and asked him how he did. He loves new mask and believes he can adapt to cpap now.

## 2015-06-29 ENCOUNTER — Ambulatory Visit (INDEPENDENT_AMBULATORY_CARE_PROVIDER_SITE_OTHER): Payer: Medicare Other | Admitting: Internal Medicine

## 2015-06-29 ENCOUNTER — Encounter: Payer: Self-pay | Admitting: Internal Medicine

## 2015-06-29 VITALS — BP 132/72 | HR 55 | Temp 98.2°F | Ht 64.0 in | Wt 177.0 lb

## 2015-06-29 DIAGNOSIS — Z8673 Personal history of transient ischemic attack (TIA), and cerebral infarction without residual deficits: Secondary | ICD-10-CM | POA: Diagnosis not present

## 2015-06-29 DIAGNOSIS — Z23 Encounter for immunization: Secondary | ICD-10-CM | POA: Diagnosis not present

## 2015-06-29 DIAGNOSIS — I25118 Atherosclerotic heart disease of native coronary artery with other forms of angina pectoris: Secondary | ICD-10-CM | POA: Diagnosis not present

## 2015-06-29 DIAGNOSIS — G4733 Obstructive sleep apnea (adult) (pediatric): Secondary | ICD-10-CM | POA: Diagnosis not present

## 2015-06-29 DIAGNOSIS — I6930 Unspecified sequelae of cerebral infarction: Secondary | ICD-10-CM

## 2015-06-29 DIAGNOSIS — I350 Nonrheumatic aortic (valve) stenosis: Secondary | ICD-10-CM

## 2015-06-29 NOTE — Assessment & Plan Note (Signed)
Diagnosed summer 2016 in setting of sleepiness related to stroke June 2016 Began CPAP in September 2016, wife feels sleeping and snoring may have improved [facility], no treatment changes recommended other than continue to work on weight reduction as ongoing

## 2015-06-29 NOTE — Assessment & Plan Note (Signed)
Moderate by echocardiogram summer 2016, progressive since summer 2014 report Initially, considered for AVR during plans for CABG summer 2016, but as CABG not pursued due to medical comorbidities with high risk, we'll continue observation and consideration of TAVR at future date if progressive or symptomatic

## 2015-06-29 NOTE — Progress Notes (Signed)
Subjective:    Patient ID: Margie BilletWilliam G Wampole, male    DOB: 08/11/1927, 81 y.o.   MRN: [id number]  HPI  Patient here for follow-up. Reviewed chronic medical issues, interval events and current concerns  Past Medical History  Diagnosis Date  . Allergic rhinitis   . Hypertension   . PVD (peripheral vascular disease) (HCC)   . Hypercholesteremia   . GERD (gastroesophageal reflux disease)   . Hemorrhoids   . Prostatitis     hx of  . Aortic stenosis     mild echo 7/14  . Acid reflux   . Family history of adverse reaction to anesthesia     "daughter had problems when she was a teen; none since; it was related to asthma"  . Heart murmur   . OSA on CPAP 05/23/2015    "just started wearing mas" (06/01/2015)  . Borderline type 2 diabetes mellitus   . History of hiatal hernia     "dx'd years ago" (06/01/2015)  . Stroke (HCC) 02/09/2015    denies residual on 06/01/2015  . Degenerative joint disease   . Arthritis     "right shoulder" (06/01/2015)    Review of Systems  Constitutional: Negative for fever, activity change, appetite change, fatigue and unexpected weight change.  Respiratory: Negative for cough, chest tightness, shortness of breath and wheezing.   Cardiovascular: Negative for chest pain, palpitations and leg swelling.  Neurological: Negative for dizziness, weakness and headaches.  Psychiatric/Behavioral: Negative for dysphoric mood. The patient is not nervous/anxious.   All other systems reviewed and are negative.      Objective:    Physical Exam  Constitutional: He is oriented to person, place, and time. He appears well-developed and well-nourished. No distress.  obese  Cardiovascular: Normal rate and regular rhythm.   Murmur (3/6 syst) heard. Pulmonary/Chest: Effort normal and breath sounds normal. No respiratory distress.  Neurological: He is alert and oriented to person, place, and time. No cranial nerve deficit. Coordination normal.  Skin: No rash noted. No  erythema.  Psychiatric: He has a normal mood and affect. His behavior is normal. Judgment and thought content normal.  Vitals reviewed.   BP 132/72 mmHg  Pulse 55  Temp(Src) 98.2 F (36.8 C) (Oral)  Ht 5\' 4"  (1.626 m)  Wt 177 lb (80.287 kg)  BMI 30.37 kg/m2  SpO2 98% Wt Readings from Last 3 Encounters:  06/29/15 177 lb (80.287 kg)  06/22/15 176 lb (79.833 kg)  06/02/15 176 lb 5.9 oz (80 kg)    Lab Results  Component Value Date   WBC 5.1 06/02/2015   HGB 13.8 06/02/2015   HCT 42.8 06/02/2015   PLT 158 06/02/2015   GLUCOSE 111* 06/02/2015   CHOL 164 04/04/2015   TRIG 42 04/04/2015   HDL 82 04/04/2015   LDLDIRECT 131.5 11/06/2011   LDLCALC 74 04/04/2015   ALT 19 02/03/2013   AST 26 02/03/2013   NA 137 06/02/2015   K 4.5 06/02/2015   CL 106 06/02/2015   CREATININE 1.33* 06/02/2015   BUN 16 06/02/2015   CO2 28 06/02/2015   TSH 1.41 02/03/2013   PSA 0.76 11/23/2010   INR 1.2* 05/24/2015   HGBA1C 6.5 04/04/2015    No results found.     Assessment & Plan:   Problem List Items Addressed This Visit    Aortic stenosis    Moderate by echocardiogram summer 2016, progressive since summer 2014 report Initially, considered for AVR during plans for CABG summer 2016, but as  CABG not pursued due to medical comorbidities with high risk, we'll continue observation and consideration of TAVR at future date if progressive or symptomatic      Arterial ischemic stroke, vertebrobasilar, thalamic, chronic    CVA symptoms June 2016 manifest with excessive sleepiness and dizziness. Wife reports deficits have largely resolved. Follow-up with neurology as ongoing with continued medical management including Plavix with aspirin 81 mg daily for at least 12 months. Last neuro note reviewed      Coronary artery disease involving native coronary artery with other forms of angina pectoris (HCC)    Severe three-vessel CAD on cath, initially recommending CABG, but felt to be high risk due to  other medical comorbidities so underwent 3 PCI 06/01/2015 -now asymptomatic without anginal symptoms Medical management as ongoing with follow-up at cardiology as planned      OSA (obstructive sleep apnea)    Diagnosed summer 2016 in setting of sleepiness related to stroke June 2016 Began CPAP in September 2016, wife feels sleeping and snoring may have improved Follow-up with sleep medicine at [facility] as planned, no treatment changes recommended other than continue to work on weight reduction as ongoing       Other Visit Diagnoses    Need for prophylactic vaccination and inoculation against influenza    -  Primary    Relevant Orders    Flu vaccine HIGH DOSE PF (Fluzone High dose) (Completed)      Time spent with pt/family today 30 minutes, greater than 50% time spent counseling patient on ongoing med mgmt of co morbidities and review of interval med events in past 6 mo   Rene Paci, MD

## 2015-06-29 NOTE — Assessment & Plan Note (Signed)
Severe three-vessel CAD on cath, initially recommending CABG, but felt to be high risk due to other medical comorbidities so underwent 3 PCI 06/01/2015 -now asymptomatic without anginal symptoms Medical management as ongoing with follow-up at cardiology as planned

## 2015-06-29 NOTE — Assessment & Plan Note (Signed)
CVA symptoms June 2016 manifest with excessive sleepiness and dizziness. Wife reports deficits have largely resolved. Follow-up with neurology as ongoing with continued medical management including Plavix with aspirin 81 mg daily for at least 12 months. Last neuro note reviewed

## 2015-06-29 NOTE — Progress Notes (Signed)
Pre visit review using our clinic review tool, if applicable. No additional management support is needed unless otherwise documented below in the visit note. 

## 2015-06-29 NOTE — Patient Instructions (Signed)
It was good to see you today.  We have reviewed your prior records including labs and tests today  Your annual flu shot was given and/or updated today.  Medications reviewed and updated, no changes recommended at this time. Refill on medication(s) as discussed today.  Continue working with her multiple other specialists as reviewed   Please schedule followup in 3-6 months with Dr. Lawerance Bachburns, call sooner if problems.

## 2015-07-04 ENCOUNTER — Encounter: Payer: Self-pay | Admitting: Cardiology

## 2015-07-04 ENCOUNTER — Ambulatory Visit (INDEPENDENT_AMBULATORY_CARE_PROVIDER_SITE_OTHER): Payer: Medicare Other | Admitting: Cardiology

## 2015-07-04 VITALS — BP 130/62 | HR 62 | Ht 64.0 in | Wt 177.8 lb

## 2015-07-04 DIAGNOSIS — I10 Essential (primary) hypertension: Secondary | ICD-10-CM

## 2015-07-04 DIAGNOSIS — I739 Peripheral vascular disease, unspecified: Secondary | ICD-10-CM

## 2015-07-04 DIAGNOSIS — I350 Nonrheumatic aortic (valve) stenosis: Secondary | ICD-10-CM | POA: Diagnosis not present

## 2015-07-04 DIAGNOSIS — I25118 Atherosclerotic heart disease of native coronary artery with other forms of angina pectoris: Secondary | ICD-10-CM

## 2015-07-04 NOTE — Patient Instructions (Signed)
Medication Instructions:  No changes today  Labwork: None today  Testing/Procedures: None   Follow-Up: Your physician recommends that you schedule a follow-up appointment in: 3 months with Dr Shirlee LatchMcLean.    Any Other Special Instructions Will Be Listed Below (If Applicable). You have been referred [facility] at [facility].      If you need a refill on your cardiac medications before your next appointment, please call your pharmacy.

## 2015-07-05 NOTE — Progress Notes (Signed)
Patient ID: Ronnie Boyd, male   DOB: 10-07-1926, 81 y.o.   MRN: [id number] PCP: Dr. Alwyn Ren  81 yo with HTN, diet-controlled diabetes, hyperlipidemia, moderate aortic stenosis, CVA, CAD, and PAD presents for cardiology followup.  Patient was admitted in 6/16 with small right medial thalamic/mid brain lacunar infarct manifesting with balance difficulty and slight drooping of the right eyelid.  Strokes signs completely resolved with no residual defect.  He had a carotid US in 6/16 without significant stenosis.    Patient also had been noting tightness in his chest with moderate exertion (yardwork, carrying a load).  Echo showed normal EF and moderate AS in 6/16.  He had an abnormal Cardiolite which was followed by [facility] in 7/16 showing significant 3 vessel disease.  He was started on Imdur and metoprolol was increased.  He was referred to cardiac surgery for evaluation as CABG was thought to be most likely to provide complete revascularization in this situation.   He saw Dr Dorris Fetch.  After discussion with neurology, it was decided that as long as he remains symptomatically stable, he should not have CABG until 3 months after CVA.  Ultimately, we elected to treat him percutaneously.  He had Synergy DES biodegradable stents to the mLAD, dLAD, and OM1.   Currently, he is doing well.  He has only slight chest tightness now if he walks fast.   He can walk 300 yards at a moderate pace without chest pain.  This is improved.  No dyspnea. He also reports bilateral calf pain with ambulation which is stable (walking about 100 yards) => he had peripheral arterial dopplers with below-the-knee disease, plan to manage medically. His Micardis was decreased to 40/12.5 because of episodes of SBP in 90s. It is ok today.     Labs (3/12): K 3.9, creatinine 1.0, TSH normal, LDL 150, HDL 76 Labs (6/12): LFTs normal, LDL 91, HDL 71 Labs (8/12): LDL 62, HDL 75 LBS (10/12): K 3.6, creatinine 1.1 Labs (5/13): LDL 75, HDL  73 Labs (10/13): K 3.7, creatinine 1.1 Labs (5/14): LDL 81, HDL 76, K 3.7, creatinine 1.1 Labs (7/16): K 3.7, creatinine 1.27, LDL 74, LDL-P 724 Labs (9/16): K 4.5, creatinine 1.33, HCT 42.8  PMH: 1. HTN 2. Hyperlipidemia: Myalgias with Crestor.  3. Diabetes mellitus: diet-controlled 4. Allergic rhinitis 5. CAD: Myoview (4/05) with no ischemia or infarction.  LHC (7/16) with 60% pLAD, 80% mLAD, 80-90% dLAD, 70+% proximal LCx after high OM, 90% proximal high OM with diffuse severe distal disease, nondominant RCA with 80% ostial stenosis, 90% proximal stenosis, and 90% mid vessel stenosis.  9/16 PCI with Synergy biodegradable DES to dLAD, mLAD, and OM1.  6. PAD: ABIs 2005 with 0.92 on right, 0.78 on left.  Peripheral arterial evaluation (5/12): ABI 0.82 right/0.79 left, TBI 0.69 right/0.54 left (not at risk of tissue loss).  ABIs (12/13) with 0.84 right, 0.97 left (improved).  Peripheral arterial dopplers (8/16) with ABI 0.76 on right, 0.73 on left => occluded bilateral anterior tibial arteries and left posterior tibial artery.  7. GERD 8. Prostatitis 9. S/p inguinal hernia repair. 10. Aortic stenosis: Moderate by echo.  Echo (3/12) with mild LVH, mild to moderate focal basal septal hypertrophy with no LV outflow tract gradient, mild aortic stenosis, normal RV size and systolic function.  Echo (6/16) with EF 65-70%, moderate AS with mean gradient 28 mmHg, AVA 1.12 cm^2.  11. CVA (6/16): Right medial thalamic/midbrain lacunar infarct.  6/16 carotid dopplers without significant stenosis.  12. OSA: CPAP  recommended.   SH: Retired, married, never smoked.    FH: Father with CVA in his 5140s, no premature CAD, no AAA  ROS: All systems reviewed and negative except as per HPI.    Current Outpatient Prescriptions  Medication Sig Dispense Refill  . alum & mag hydroxide-simeth (MAALOX/MYLANTA) 200-200-20 MG/5ML suspension Take 15 mLs by mouth every 6 (six) hours as needed for indigestion or heartburn.     Marland Kitchen. amLODipine (NORVASC) 10 MG tablet Take 1 tablet (10 mg total) by mouth daily. 90 tablet 3  . aspirin EC 81 MG tablet Take 81 mg by mouth daily.    . Azelastine-Fluticasone (DYMISTA) 137-50 MCG/ACT SUSP Place 1 spray into the nose daily as needed (allergies).    . clopidogrel (PLAVIX) 75 MG tablet Take 1 tablet (75 mg total) by mouth daily. 30 tablet 11  . Coenzyme Q10 200 MG capsule Take 200 mg by mouth daily. Take one daily    . Garlic Oil 1000 MG CAPS Take 1 capsule by mouth daily.      . isosorbide mononitrate (IMDUR) 60 MG 24 hr tablet Take 60 mg by mouth daily.    Marland Kitchen. loratadine (CLARITIN) 10 MG tablet Take 10 mg by mouth daily as needed for rhinitis.     . metoprolol tartrate (LOPRESSOR) 25 MG tablet Take 25 mg by mouth 2 (two) times daily.    . Multiple Vitamins-Minerals (CENTRUM SILVER) tablet Take 1 tablet by mouth daily.      . nitroGLYCERIN (NITROSTAT) 0.4 MG SL tablet Place 1 tablet (0.4 mg total) under the tongue every 5 (five) minutes as needed for chest pain. 25 tablet 3  . Omega-3 Fatty Acids (FISH OIL) 1000 MG CAPS Take 1 capsule by mouth daily.      . pantoprazole (PROTONIX) 40 MG tablet TAKE 1 TABLET (40 MG TOTAL) BY MOUTH DAILY. 30 tablet 3  . potassium chloride SA (KLOR-CON M20) 20 MEQ tablet Take 1 tablet (20 mEq total) by mouth daily. 90 tablet 3  . pravastatin (PRAVACHOL) 40 MG tablet Take 1 tablet (40 mg total) by mouth every evening. 90 tablet 1  . tamsulosin (FLOMAX) 0.4 MG CAPS capsule Take 0.4 mg by mouth daily.    Marland Kitchen. telmisartan-hydrochlorothiazide (MICARDIS HCT) 80-25 MG per tablet Take 1/2 tablet by mouth daily 90 tablet 3   No current facility-administered medications for this visit.    BP 130/62 mmHg  Pulse 62  Ht 5\' 4"  (1.626 m)  Wt 177 lb 12.8 oz (80.65 kg)  BMI 30.50 kg/m2  SpO2 98% General: NAD Neck: JVP 7 cm, no thyromegaly or thyroid nodule.  Lungs: Clear to auscultation bilaterally with normal respiratory effort. CV: Nondisplaced PMI.  Heart  regular S1/S2, no S3/S4, 3/6 systolic crescendo-decrescendo murmur RUSB with clear S2 and radiation of murmur to neck.  Trace ankle edema.  1+ left PT pulse, difficult to palpate R PT pulse.  Abdomen: Soft, nontender, no hepatosplenomegaly, no distention.  Neurologic: Alert and oriented x 3.  Psych: Normal affect. Extremities: No clubbing or cyanosis.   Assessment/Plan: 1. CAD: Diffuse severe 3 vessel disease.  Now s/p PCI in 9/16 with Synergy biodegradable stents to the mLAD, dLAD, and OM1.  He seems to be doing well since then with minimal angina.   - Continue Imdur, metoprolol, pravastatin, ASA 81, and Plavix 75 daily.  He will need DAPT for at least a year. Will likely continue Plavix longer if tolerated given CVA.   - I will arrange for cardiac rehab.  2. Aortic stenosis: Moderate on recent echo.  He would likely be a TAVR candidate in future if AS worsens.   3. CVA: 6/16 lacunar CVA, followed by neurology, on Plavix/ASA.  4. PAD: Bilateral calf pain after walking about 100 yards. Extensive below-knee PAD.  Medical management.  Continue statin, Plavix, and ASA.   5. Hyperlipidemia: Continue pravastatin 40 mg daily.  Good lipids in 7/16.  6. HTN: BP controlled.  7. OSA: Needs to start CPAP.   Followup in 3 months   Marca Ancona 07/05/2015

## 2015-07-20 ENCOUNTER — Ambulatory Visit (INDEPENDENT_AMBULATORY_CARE_PROVIDER_SITE_OTHER): Payer: Medicare Other | Admitting: Neurology

## 2015-07-20 ENCOUNTER — Encounter: Payer: Self-pay | Admitting: Neurology

## 2015-07-20 VITALS — BP 120/62 | HR 58 | Resp 20 | Ht 64.0 in | Wt 177.0 lb

## 2015-07-20 DIAGNOSIS — Z9989 Dependence on other enabling machines and devices: Principal | ICD-10-CM

## 2015-07-20 DIAGNOSIS — G4733 Obstructive sleep apnea (adult) (pediatric): Secondary | ICD-10-CM

## 2015-07-20 NOTE — Progress Notes (Signed)
SLEEP MEDICINE CLINIC   Provider:  Melvyn Novas, M D  Referring Provider: Newt Lukes, MD Primary Care Physician:  Rene Paci, MD  Chief Complaint  Patient presents with  . New Patient (Initial Visit)    cpap f/u, rm 10, alone    HPI:  Ronnie Boyd is a 81 y.o. male  Is seen here as a referral  from Ronnie Boyd and Ronnie Boyd for a sleep evaluation.  Ronnie Boyd was seen during his hospitalization by my colleague, Ronnie Boyd.  He had presented with a recent stroke that affected the thalamic region. One of his symptoms was leaning towards the left and weaker side. Doctor Pearlean Boyd  had added a baby aspirin to Plavix 75 mg daily , and had explained the goals of control of hypertension blood pressure and cholesterol. The patient was advised that he should partake in regular exercise, moderate diet.  The patient noted that he likes to walk but that he developed cramps in his thighs and both lower extremities , which he attributes to his cholesterol-lowering medication.  Change from Lipitor to Pravachol followed recently. This caused him to have myositis symptoms.  I quote from r. Doolittle's note dated 03-17-15 that the patient was noted by family to develop excessive sleepiness over the preceding 2 weeks ago . The symptoms lasted for several days before .Ronnie Boyd also noticed that he was slightly off balance, identifying a fall risk. The wife noted his right eyelid to be droopy but there was no vision impairment.  The daughter notices this as well . Headaches or speech difficulties were denied.  His symptoms had improved and he was also not quite as sleepy anymore and walking better when he was finally receiving an outpatient MRI scan of the brain on 6-20 4-16 which showed a small acute right medial thalamic and midbrain infarct. This was measured to be a local. The MRI of the brain showed no large vessel stenosis and carotid ultrasound from 6-20 9-16 reviewed by Ronnie Boyd  showed no significant stenosis in the extracranial parts. He had a transthoracic cardiac echo done which was normal.  Nuclear medicine scan was normal and he is therefore scheduled to undergo cardiac catheterization with Dr. Marca Ancona.   The patient reports that he has no trouble falling asleep when watching TV in his living room. He is has an easier time sleeping in a seated position or reclined position.  When he transfers to the bedroom he struggles to find a comfortable sleep position and to go to sleep.  He describes his marital bedroom is cool, quiet and dark. He sleeps on his back on multiple pillows . The patient has to go to the bathroom at least 2-3 times at night and he is taking diabetic medications. Usually can reinitiate sleep quickly after bathroom break. He rises in the morning at about 6:30 AM spontaneously without alarm. He does not describe any morning headaches, he wakes up with a dry mouth. His wife states usually in bed he often returns to the bedroom a nap for another hour. Does not drink any caffeinated beverages. He is retired from his career job but he does keep his  hobby of lawn and garden care.He takes naps, after lunch, for 1 hour . He can easily fall asleep in daytime. He takes an after dinner nap daily. Total sleep time over 24 hours is about 8 hours. He no history of ENT, neck of facial surgery.  Denies falling asleep  driving.  He feels back to baseline.  Interval history, dated 05-23-15  Ronnie Boyd underwent a sleep study was split night protocol on 04-19-15 . He  had endorsed the Epworth sleepiness score in the sleep lab at only 3 points but previously in the office endorsed 11 points.  He was only asleep for about 50% of the recorded time for the diagnostic part of the study.  He was diagnosed with moderate to severe apnea at an AHI of 28.6 and severe upper airway restriction with an RDI of 55.6. In supine sleep his AHI was 30.4 and unfortunately REM sleep was not  recorded. His heart rate varied between 42 and 55. The patient was titrated to CPAP and the AHI became 0.0 under 9 cm water pressure. Now he was able to reach REM sleep AHI in rem sleep became 0 as well. Recommended to start the patient on CPAP with a 9 cm water pressure setting and a nasal mask. Heated humidity should be added. We are here today to discuss the results of the recent study. He has not started CPAP.Marland Kitchen. His Epworth sleepiness score today is at 10 points. Ronnie Boyd reports that he is able to dream, and usually feels refreshed and restored in the morning when waking up. He was advised of the degree of apnea, the association with bradycardia, he did not have significant oxygen drops. My recommendation would still be a CPAP given that his RDI was excessively high in conjunction with a moderate degree of apnea. I also explained that the dental device could not be used in patients that have this degree of apnea.  07-20-15 Ronnie Boyd is here following up on his recent sleep study from 04-19-15. He is a meanwhile 81 year old gentleman who does not look his age. And he underwent a split-night polysomnography the baseline or diagnostic part revealed an AHI of 28.6 and an RDI of 55.6. Supine sleep accentuated the apnea further. He also had no periodic limb movements and frequent respiratory related arousals from snoring or struggling to get air. His heart rate was very low on average 42 bpm. CPAP was initiated at 5 and titrated to 10 cm water pressure. At 9 cm water the AHI became 0.0. The recommended to start on an AutoSet. This would treat the upper airway resistance as well as the apnea.  I'm also able today to review a download and compliance data. Dated 07-18-15 the patient was 100% compliance for the last 30 days and 90% compliance for over 4 hours of consecutive use. The average time using his CPAP nightly 6 hours and 59 minutes. His AHI is 3.2 the pressure window is between 5 and 10 cm water with a 3 cm  EPR. At this setting he is excellently treated and I would not want to adjust or define 1 setting but leave him on his current machine the patient reports that the tears airflow is sometimes too strong. He would like to have a RAMP time set for about 4 minutes.   Review of Systems: Out of a complete 14 system review, the patient complains of only the following symptoms, and all other reviewed systems are negative. Snoring.  Epworth score 8 from 10 , Fatigue severity score  21 from 21  ,  Geriatric depression score 2- 2 points  Weight loss strategies discussed.     Social History   Social History  . Marital Status: Married    Spouse Name: [name]  . Number of Children: [name]  .  Years of Education: [name]   Occupational History  . retired    Social History Main Topics  . Smoking status: Never Smoker   . Smokeless tobacco: Never Used  . Alcohol Use: No  . Drug Use: No  . Sexual Activity: Not Currently   Other Topics Concern  . Not on file   Social History Narrative    Family History  Problem Relation Age of Onset  . Arthritis    . Hypertension Mother   . Diabetes Mother 74  . Stroke Father 47  . Diabetes Brother     Past Medical History  Diagnosis Date  . Allergic rhinitis   . Hypertension   . PVD (peripheral vascular disease) (HCC)   . Hypercholesteremia   . GERD (gastroesophageal reflux disease)   . Hemorrhoids   . Prostatitis     hx of  . Aortic stenosis     mild echo 7/14  . Acid reflux   . Family history of adverse reaction to anesthesia     "daughter had problems when she was a teen; none since; it was related to asthma"  . Heart murmur   . OSA on CPAP 05/23/2015    "just started wearing mas" (06/01/2015)  . Borderline type 2 diabetes mellitus   . History of hiatal hernia     "dx'd years ago" (06/01/2015)  . Stroke (HCC) 02/09/2015    denies residual on 06/01/2015  . Degenerative joint disease   . Arthritis     "right shoulder" (06/01/2015)    Past Surgical  History  Procedure Laterality Date  . Carpal tunnel release Bilateral 2006-2007  . Excisional hemorrhoidectomy  1989  . Cardiac catheterization [name] 03/18/2015    Procedure: Left Heart Cath and Coronary Angiography;  Surgeon: Laurey Morale, MD;  Location: [facility] INVASIVE CV LAB;  Service: Cardiovascular;  Laterality: [name];  . Cardiac catheterization [name] 06/01/2015    Procedure: Coronary Stent Intervention;  Surgeon: Iran Ouch, MD;  Location: [facility] INVASIVE CV LAB;  Service: Cardiovascular;  Laterality: [name];  . Cataract extraction w/ intraocular lens  implant, bilateral Bilateral ~ 2003    Current Outpatient Prescriptions  Medication Sig Dispense Refill  . alum & mag hydroxide-simeth (MAALOX/MYLANTA) 200-200-20 MG/5ML suspension Take 15 mLs by mouth every 6 (six) hours as needed for indigestion or heartburn.    Marland Kitchen amLODipine (NORVASC) 10 MG tablet Take 1 tablet (10 mg total) by mouth daily. 90 tablet 3  . aspirin EC 81 MG tablet Take 81 mg by mouth daily.    . Azelastine-Fluticasone (DYMISTA) 137-50 MCG/ACT SUSP Place 1 spray into the nose daily as needed (allergies).    . clopidogrel (PLAVIX) 75 MG tablet Take 1 tablet (75 mg total) by mouth daily. 30 tablet 11  . Coenzyme Q10 200 MG capsule Take 200 mg by mouth daily. Take one daily    . Garlic Oil 1000 MG CAPS Take 1 capsule by mouth daily.      . isosorbide mononitrate (IMDUR) 60 MG 24 hr tablet Take 60 mg by mouth daily.    Marland Kitchen loratadine (CLARITIN) 10 MG tablet Take 10 mg by mouth daily as needed for rhinitis.     . metoprolol tartrate (LOPRESSOR) 25 MG tablet Take 25 mg by mouth 2 (two) times daily.    . Multiple Vitamins-Minerals (CENTRUM SILVER) tablet Take 1 tablet by mouth daily.      . nitroGLYCERIN (NITROSTAT) 0.4 MG SL tablet Place 1 tablet (0.4 mg total) under the tongue every  5 (five) minutes as needed for chest pain. 25 tablet 3  . Omega-3 Fatty Acids (FISH OIL) 1000 MG CAPS Take 1 capsule by mouth daily.      . pantoprazole  (PROTONIX) 40 MG tablet TAKE 1 TABLET (40 MG TOTAL) BY MOUTH DAILY. 30 tablet 3  . potassium chloride SA (KLOR-CON M20) 20 MEQ tablet Take 1 tablet (20 mEq total) by mouth daily. 90 tablet 3  . pravastatin (PRAVACHOL) 40 MG tablet Take 1 tablet (40 mg total) by mouth every evening. 90 tablet 1  . tamsulosin (FLOMAX) 0.4 MG CAPS capsule Take 0.4 mg by mouth daily.    Marland Kitchen telmisartan-hydrochlorothiazide (MICARDIS HCT) 80-25 MG per tablet Take 1/2 tablet by mouth daily 90 tablet 3   No current facility-administered medications for this visit.    Allergies as of 07/20/2015  . (No Known Allergies)    Vitals: BP 120/62 mmHg  Pulse 58  Resp 20  Ht 5\' 4"  (1.626 m)  Wt 177 lb (80.287 kg)  BMI 30.37 kg/m2 Last Weight:  Wt Readings from Last 1 Encounters:  07/20/15 177 lb (80.287 kg)       Last Height:   Ht Readings from Last 1 Encounters:  07/20/15 5\' 4"  (1.626 m)    Physical exam:  General: The patient is awake, alert and appears not in acute distress. The patient is well groomed. Head: Normocephalic, atraumatic. Neck is supple. Mallampati 4 , large tongue and retrognathia.  neck circumference:18. Nasal airflow unrestricted ,  No TMJ .  Cardiovascular:  Regular rate and rhythm , without  murmurs or carotid bruit, and without distended neck veins. Respiratory: Lungs are clear to auscultation. Skin:  Without evidence of edema, or rash Trunk: BMI is elevated and patient has normal posture.  Neurologic exam : The patient is awake and alert, oriented to place and time.   Memory subjective  described as intact. There is a normal attention span & concentration ability.  Speech is fluent without  dysarthria, dysphonia or aphasia. Mood and affect are appropriate.  Cranial nerves: Pupils are equal and briskly reactive to light. Extraocular movements  in vertical and horizontal planes intact and without nystagmus. Visual fields by finger perimetry are intact. Hearing to finger rub intact.     Facial sensation intact to fine touch. Facial motor strength is symmetric and tongue and uvula move midline.  Motor exam:  Normal tone, muscle bulk and symmetric ,strength in all extremities. Sensory:  Fine touch, pinprick and vibration were tested in all extremities.  Proprioception is tested in the upper extremities -normal. Coordination: Rapid alternating movements in the fingers/hands is normal.  Finger-to-nose maneuver without evidence of ataxia, dysmetria or tremor. Gait and station: Patient walks without assistive device and is able to climb up to the exam table. Strength within normal limits. Stance is stable and normal. Tandem gait is unfragmented. Romberg testing is negative. Deep tendon reflexes: in the  upper and lower extremities are symmetric and intact. Babinski maneuver response is downgoing.  Assessment:  After physical and neurologic examination, review of laboratory studies, imaging, neurophysiology testing and pre-existing records, assessment is   Patient recently documented coronary artery disease status post heart catheterization on 03-18-15.  His bypass surgery was postponed. He suffered a stroke as of this summer, and was seen by my colleague Ronnie Boyd.  His sleep study clearly indicated obstructive sleep apnea with an apnea index of 28.6. His RDI was 55.6 much higher. This indicates that the patient suffers an apnea every  second minute of sleep. He was surprised to learn this. Since he has also upper airway resistance the I recommended CPAP use.  I would like for the patient to start with CPAP using a small nasal mask at night. [facility] is his [facility]. He has excellent compliance.  He will follow for sleep clinic only in 12 month.   The patient was advised of the nature of the diagnosed sleep disorder , the treatment options and risks for general a health and wellness arising from not treating the condition. Visit duration was 25 minutes.  More than 50% of the  face to face time is used in discussion of the results of the recent tests, the best treatment options for Mr. Vandeberg and the goal of preventing a new stroke in the future.  CPAP was deemed to be the most appropriate treatment.   Porfirio Mylar Gadge Hermiz MD  07/20/2015

## 2015-07-20 NOTE — Patient Instructions (Signed)

## 2015-07-21 ENCOUNTER — Encounter (HOSPITAL_COMMUNITY)
Admission: RE | Admit: 2015-07-21 | Discharge: 2015-07-21 | Disposition: A | Discharge status: Home / Self Care | Payer: Medicare Other | Source: Ambulatory Visit | Attending: Cardiology | Admitting: Cardiology

## 2015-07-21 DIAGNOSIS — Z955 Presence of coronary angioplasty implant and graft: Secondary | ICD-10-CM | POA: Insufficient documentation

## 2015-07-25 ENCOUNTER — Encounter (HOSPITAL_COMMUNITY)
Admission: RE | Admit: 2015-07-25 | Discharge: 2015-07-25 | Disposition: A | Discharge status: Home / Self Care | Payer: Medicare Other | Source: Ambulatory Visit | Attending: Cardiology | Admitting: Cardiology

## 2015-07-25 DIAGNOSIS — Z955 Presence of coronary angioplasty implant and graft: Secondary | ICD-10-CM | POA: Diagnosis not present

## 2015-07-25 NOTE — Progress Notes (Signed)
Pt started cardiac rehab today.  Pt tolerated light exercise without difficulty. VSS, telemetry-SR asymptomatic. Pt questions the diagnosis of diet controlled DM. To the best of pt knowledge he has not been told he is diabetic.  Pt does not check his blood glucose nor was aware of what a A1C mean.  Will have pt follow up and speak with his primary MD.  A1C noted to be 6.5. Medication list reconciled.  Pt verbalized compliance with medications and denies barriers to compliance. PSYCHOSOCIAL ASSESSMENT:  PHQ-0. Pt exhibits positive coping skills, hopeful outlook with supportive family. Pt is accompanied by his wife who was also at the orientation class last week. No psychosocial needs identified at this time, no psychosocial interventions necessary.    Pt enjoys gardening but admits here lately he has not been able to be as active as he once was.   Pt cardiac rehab  goal is  to develop and exercise routine and be in better health. Pt in the past has not exercised very much other than occasional walks.  Pt encouraged to participate in home exercise and education classes to increase ability to achieve these goals.   Pt long term cardiac rehab goal is lose about 10 -15 pounds. Increase activity and knowledge will increase his ability to achieve this goals.  Will monitor pt weights for achievement of this goal.  Pt oriented to exercise equipment and routine.  Understanding verbalized. Alanson Alyarlette Arjan Strohm RN, BSN

## 2015-07-27 ENCOUNTER — Encounter (HOSPITAL_COMMUNITY)
Admission: RE | Admit: 2015-07-27 | Discharge: 2015-07-27 | Disposition: A | Discharge status: Home / Self Care | Payer: Medicare Other | Source: Ambulatory Visit | Attending: Cardiology | Admitting: Cardiology

## 2015-07-27 DIAGNOSIS — Z955 Presence of coronary angioplasty implant and graft: Secondary | ICD-10-CM | POA: Diagnosis not present

## 2015-07-29 ENCOUNTER — Encounter (HOSPITAL_COMMUNITY)
Admission: RE | Admit: 2015-07-29 | Discharge: 2015-07-29 | Disposition: A | Discharge status: Home / Self Care | Payer: Medicare Other | Source: Ambulatory Visit | Attending: Cardiology | Admitting: Cardiology

## 2015-07-29 DIAGNOSIS — Z955 Presence of coronary angioplasty implant and graft: Secondary | ICD-10-CM | POA: Diagnosis not present

## 2015-08-01 ENCOUNTER — Encounter (HOSPITAL_COMMUNITY)
Admission: RE | Admit: 2015-08-01 | Discharge: 2015-08-01 | Disposition: A | Discharge status: Home / Self Care | Payer: Medicare Other | Source: Ambulatory Visit | Attending: Cardiology | Admitting: Cardiology

## 2015-08-01 DIAGNOSIS — Z955 Presence of coronary angioplasty implant and graft: Secondary | ICD-10-CM | POA: Diagnosis not present

## 2015-08-01 NOTE — Progress Notes (Signed)
Reviewed home exercise with pt today.  Pt plans to walk at home for exercise.  Reviewed THR, pulse, RPE, sign and symptoms, NTG use, and when to call 911 or MD.  Pt voiced understanding. Annora Guderian, MA, ACSM RCEP   

## 2015-08-03 ENCOUNTER — Encounter (HOSPITAL_COMMUNITY)
Admission: RE | Admit: 2015-08-03 | Discharge: 2015-08-03 | Disposition: A | Discharge status: Home / Self Care | Payer: Medicare Other | Source: Ambulatory Visit | Attending: Cardiology | Admitting: Cardiology

## 2015-08-03 DIAGNOSIS — Z955 Presence of coronary angioplasty implant and graft: Secondary | ICD-10-CM | POA: Diagnosis not present

## 2015-08-08 ENCOUNTER — Encounter (HOSPITAL_COMMUNITY)
Admission: RE | Admit: 2015-08-08 | Discharge: 2015-08-08 | Disposition: A | Discharge status: Home / Self Care | Payer: Medicare Other | Source: Ambulatory Visit | Attending: Cardiology | Admitting: Cardiology

## 2015-08-08 DIAGNOSIS — Z955 Presence of coronary angioplasty implant and graft: Secondary | ICD-10-CM | POA: Diagnosis not present

## 2015-08-10 ENCOUNTER — Encounter (HOSPITAL_COMMUNITY)
Admission: RE | Admit: 2015-08-10 | Discharge: 2015-08-10 | Disposition: A | Discharge status: Home / Self Care | Payer: Medicare Other | Source: Ambulatory Visit | Attending: Cardiology | Admitting: Cardiology

## 2015-08-10 DIAGNOSIS — Z955 Presence of coronary angioplasty implant and graft: Secondary | ICD-10-CM | POA: Diagnosis not present

## 2015-08-12 ENCOUNTER — Encounter (HOSPITAL_COMMUNITY)
Admission: RE | Admit: 2015-08-12 | Discharge: 2015-08-12 | Disposition: A | Discharge status: Home / Self Care | Payer: Medicare Other | Source: Ambulatory Visit | Attending: Cardiology | Admitting: Cardiology

## 2015-08-12 DIAGNOSIS — Z955 Presence of coronary angioplasty implant and graft: Secondary | ICD-10-CM | POA: Diagnosis not present

## 2015-08-15 ENCOUNTER — Encounter (HOSPITAL_COMMUNITY)
Admission: RE | Admit: 2015-08-15 | Discharge: 2015-08-15 | Disposition: A | Discharge status: Home / Self Care | Payer: Medicare Other | Source: Ambulatory Visit | Attending: Cardiology | Admitting: Cardiology

## 2015-08-15 DIAGNOSIS — Z955 Presence of coronary angioplasty implant and graft: Secondary | ICD-10-CM | POA: Diagnosis not present

## 2015-08-17 ENCOUNTER — Encounter (HOSPITAL_COMMUNITY): Payer: Medicare Other

## 2015-08-17 ENCOUNTER — Telehealth (HOSPITAL_COMMUNITY): Payer: Self-pay | Admitting: Internal Medicine

## 2015-08-19 ENCOUNTER — Encounter (HOSPITAL_COMMUNITY)
Admission: RE | Admit: 2015-08-19 | Discharge: 2015-08-19 | Disposition: A | Discharge status: Home / Self Care | Payer: Medicare Other | Source: Ambulatory Visit | Attending: Cardiology | Admitting: Cardiology

## 2015-08-19 DIAGNOSIS — Z955 Presence of coronary angioplasty implant and graft: Secondary | ICD-10-CM | POA: Diagnosis not present

## 2015-08-22 ENCOUNTER — Encounter (HOSPITAL_COMMUNITY)
Admission: RE | Admit: 2015-08-22 | Discharge: 2015-08-22 | Disposition: A | Discharge status: Home / Self Care | Payer: Medicare Other | Source: Ambulatory Visit | Attending: Cardiology | Admitting: Cardiology

## 2015-08-22 DIAGNOSIS — Z955 Presence of coronary angioplasty implant and graft: Secondary | ICD-10-CM | POA: Diagnosis not present

## 2015-08-23 ENCOUNTER — Ambulatory Visit: Payer: Medicare Other | Admitting: Neurology

## 2015-08-24 ENCOUNTER — Encounter (HOSPITAL_COMMUNITY)
Admission: RE | Admit: 2015-08-24 | Discharge: 2015-08-24 | Disposition: A | Discharge status: Home / Self Care | Payer: Medicare Other | Source: Ambulatory Visit | Attending: Cardiology | Admitting: Cardiology

## 2015-08-24 DIAGNOSIS — Z955 Presence of coronary angioplasty implant and graft: Secondary | ICD-10-CM | POA: Diagnosis not present

## 2015-08-26 ENCOUNTER — Encounter (HOSPITAL_COMMUNITY)
Admission: RE | Admit: 2015-08-26 | Discharge: 2015-08-26 | Disposition: A | Discharge status: Home / Self Care | Payer: Medicare Other | Source: Ambulatory Visit | Attending: Cardiology | Admitting: Cardiology

## 2015-08-26 DIAGNOSIS — Z955 Presence of coronary angioplasty implant and graft: Secondary | ICD-10-CM | POA: Diagnosis not present

## 2015-08-29 ENCOUNTER — Encounter (HOSPITAL_COMMUNITY)
Admission: RE | Admit: 2015-08-29 | Discharge: 2015-08-29 | Disposition: A | Discharge status: Home / Self Care | Payer: Medicare Other | Source: Ambulatory Visit | Attending: Cardiology | Admitting: Cardiology

## 2015-08-29 DIAGNOSIS — Z955 Presence of coronary angioplasty implant and graft: Secondary | ICD-10-CM | POA: Diagnosis not present

## 2015-08-30 ENCOUNTER — Encounter: Payer: Self-pay | Admitting: Cardiovascular Disease

## 2015-08-30 ENCOUNTER — Ambulatory Visit (INDEPENDENT_AMBULATORY_CARE_PROVIDER_SITE_OTHER): Payer: Medicare Other | Admitting: Cardiovascular Disease

## 2015-08-30 VITALS — BP 160/62 | HR 75 | Ht 64.0 in | Wt 178.0 lb

## 2015-08-30 DIAGNOSIS — I739 Peripheral vascular disease, unspecified: Secondary | ICD-10-CM | POA: Diagnosis not present

## 2015-08-30 NOTE — Patient Instructions (Signed)

## 2015-08-30 NOTE — Progress Notes (Signed)
Patient ID: Ronnie Boyd, male   DOB: Mar 29, 1927, 81 y.o.   MRN: [id number] PCP: Dr. Alwyn Ren Primary cardiologist: Dr. Shirlee Latch  81 yo with HTN, diet-controlled diabetes, hyperlipidemia, moderate aortic stenosis, CVA, CAD, and PAD who is here today for a follow-up visit regarding PAD.   He has known history of coronary artery disease and moderate aortic stenosis.  Echo showed normal EF and moderate AS in 6/16.  He had an abnormal Cardiolite which was followed by LHC in 7/16 showing significant 3 vessel disease.   After discussion of different options including CABG versus multivessel PCI, I proceeded with PCI in September including distal and mid LAD as well as OM 1.  His angina now was very mild and he has been attending cardiac rehabilitation with good progress. He is known to have bilateral calf claudication and this has been treated medically given that his symptoms are overall mild. Overall, he is very happy with his progress.  PMH: 1. HTN 2. Hyperlipidemia: Myalgias with Crestor.  3. Diabetes mellitus: diet-controlled 4. Allergic rhinitis 5. CAD: Myoview (4/05) with no ischemia or infarction.  LHC (7/16) with 60% pLAD, 80% mLAD, 80-90% dLAD, 70+% proximal LCx after high OM, 90% proximal high OM with diffuse severe distal disease, nondominant RCA with 80% ostial stenosis, 90% proximal stenosis, and 90% mid vessel stenosis.  6. PAD: ABIs 2005 with 0.92 on right, 0.78 on left.  Peripheral arterial evaluation (5/12): ABI 0.82 right/0.79 left, TBI 0.69 right/0.54 left (not at risk of tissue loss).  ABIs (12/13) with 0.84 right, 0.97 left (improved).  7. GERD 8. Prostatitis 9. S/p inguinal hernia repair. 10. Aortic stenosis: Moderate by echo.  Echo (3/12) with mild LVH, mild to moderate focal basal septal hypertrophy with no LV outflow tract gradient, mild aortic stenosis, normal RV size and systolic function.  Echo (6/16) with EF 65-70%, moderate AS with mean gradient 28 mmHg, AVA 1.12  cm^2.  11. CVA (6/16): Right medial thalamic/midbrain lacunar infarct.  6/16 carotid dopplers without significant stenosis.   SH: Retired, married, never smoked.    FH: Father with CVA in his 30s, no premature CAD, no AAA  ROS: All systems reviewed and negative except as per HPI.    Current Outpatient Prescriptions  Medication Sig Dispense Refill  . alum & mag hydroxide-simeth (MAALOX/MYLANTA) 200-200-20 MG/5ML suspension Take 15 mLs by mouth every 6 (six) hours as needed for indigestion or heartburn.    Marland Kitchen amLODipine (NORVASC) 10 MG tablet Take 1 tablet (10 mg total) by mouth daily. 90 tablet 3  . aspirin EC 81 MG tablet Take 81 mg by mouth daily.    . Azelastine-Fluticasone (DYMISTA) 137-50 MCG/ACT SUSP Place 1 spray into the nose daily as needed (allergies).    . clopidogrel (PLAVIX) 75 MG tablet Take 1 tablet (75 mg total) by mouth daily. 30 tablet 11  . Coenzyme Q10 200 MG capsule Take 200 mg by mouth daily. Take one daily    . Garlic Oil 1000 MG CAPS Take 1 capsule by mouth daily.      . isosorbide mononitrate (IMDUR) 60 MG 24 hr tablet Take 60 mg by mouth daily.    Marland Kitchen loratadine (CLARITIN) 10 MG tablet Take 10 mg by mouth daily as needed for rhinitis.     . metoprolol tartrate (LOPRESSOR) 25 MG tablet Take 25 mg by mouth 2 (two) times daily.    . Multiple Vitamins-Minerals (CENTRUM SILVER) tablet Take 1 tablet by mouth daily.      Marland Kitchen  nitroGLYCERIN (NITROSTAT) 0.4 MG SL tablet Place 1 tablet (0.4 mg total) under the tongue every 5 (five) minutes as needed for chest pain. 25 tablet 3  . Omega-3 Fatty Acids (FISH OIL) 1000 MG CAPS Take 1 capsule by mouth daily.      . pantoprazole (PROTONIX) 40 MG tablet TAKE 1 TABLET (40 MG TOTAL) BY MOUTH DAILY. 30 tablet 3  . potassium chloride SA (KLOR-CON M20) 20 MEQ tablet Take 1 tablet (20 mEq total) by mouth daily. 90 tablet 3  . pravastatin (PRAVACHOL) 40 MG tablet Take 1 tablet (40 mg total) by mouth every evening. 90 tablet 1  . tamsulosin  (FLOMAX) 0.4 MG CAPS capsule Take 0.4 mg by mouth daily.    Marland Kitchen. telmisartan-hydrochlorothiazide (MICARDIS HCT) 80-25 MG per tablet Take 1/2 tablet by mouth daily 90 tablet 3   No current facility-administered medications for this visit.    BP 160/62 mmHg  Pulse 75  Ht 5\' 4"  (1.626 m)  Wt 178 lb (80.74 kg)  BMI 30.54 kg/m2 General: NAD Neck: JVP 8 cm, no thyromegaly or thyroid nodule.  Lungs: Clear to auscultation bilaterally with normal respiratory effort. CV: Nondisplaced PMI.  Heart regular S1/S2, no S3/S4, 3/6 systolic crescendo-decrescendo murmur RUSB with clear S2 and radiation of murmur to neck.  1+ edema 1/3 up lower legs bilaterally.  1+ left PT pulse, difficult to palpate R PT pulse.  Abdomen: Soft, nontender, no hepatosplenomegaly, no distention.  Neurologic: Alert and oriented x 3.  Psych: Normal affect. Extremities: No clubbing or cyanosis.  Vascular: Femoral pulses are normal. Distal pulses are not palpable.    Assessment/Plan: 1. CAD: Diffuse severe 3 vessel disease.   Status post PCI and drug-eluting stent placement as outlined above with improvement in angina- Continue Imdur, metoprolol, pravastatin, ASA 81, and Plavix 75 daily.  He is doing well with cardiac rehabilitation.  2. Aortic stenosis: Moderate on recent echo.  Plan to hold off on AVR when he has CABG to decrease time and complexity of surgery given recent stroke.  He would likely be a TAVR candidate in future if AS worsens.    3. PAD: Bilateral calf pain after walking about 100 feet.   The symptoms improved with a walking program in cardiac rehabilitation. His symptoms are currently not lifestyle limiting and thus I recommend continuing medical therapy. Follow-up with me on a yearly basis or earlier if needed.   Lorine BearsMuhammad Arida, MD

## 2015-08-31 ENCOUNTER — Encounter (HOSPITAL_COMMUNITY)
Admission: RE | Admit: 2015-08-31 | Discharge: 2015-08-31 | Disposition: A | Discharge status: Home / Self Care | Payer: Medicare Other | Source: Ambulatory Visit | Attending: Cardiology | Admitting: Cardiology

## 2015-08-31 DIAGNOSIS — Z955 Presence of coronary angioplasty implant and graft: Secondary | ICD-10-CM | POA: Diagnosis not present

## 2015-09-02 ENCOUNTER — Encounter (HOSPITAL_COMMUNITY)
Admission: RE | Admit: 2015-09-02 | Discharge: 2015-09-02 | Disposition: A | Discharge status: Home / Self Care | Payer: Medicare Other | Source: Ambulatory Visit | Attending: Cardiology | Admitting: Cardiology

## 2015-09-02 DIAGNOSIS — Z955 Presence of coronary angioplasty implant and graft: Secondary | ICD-10-CM | POA: Diagnosis not present

## 2015-09-06 ENCOUNTER — Other Ambulatory Visit (HOSPITAL_COMMUNITY): Payer: Self-pay | Admitting: *Deleted

## 2015-09-06 MED ORDER — ISOSORBIDE MONONITRATE ER 60 MG PO TB24: 60.0000 mg | ORAL_TABLET | Freq: Every day | ORAL | Status: DC

## 2015-09-07 ENCOUNTER — Encounter (HOSPITAL_COMMUNITY): Payer: Medicare Other

## 2015-09-07 ENCOUNTER — Telehealth (HOSPITAL_COMMUNITY): Payer: Self-pay | Admitting: Internal Medicine

## 2015-09-09 ENCOUNTER — Encounter (HOSPITAL_COMMUNITY)
Admission: RE | Admit: 2015-09-09 | Discharge: 2015-09-09 | Disposition: A | Discharge status: Home / Self Care | Payer: Medicare Other | Source: Ambulatory Visit | Attending: Cardiology | Admitting: Cardiology

## 2015-09-09 DIAGNOSIS — Z955 Presence of coronary angioplasty implant and graft: Secondary | ICD-10-CM | POA: Diagnosis not present

## 2015-09-09 NOTE — Progress Notes (Signed)
Margie BilletWilliam G Haberland 81 y.o. male Nutrition Note Spoke with pt. Nutrition Plan and Nutrition Survey goals reviewed with pt. Pt is following Step 2 of the Therapeutic Lifestyle Changes diet. Pt is diabetic. Last A1c indicates blood glucose well-controlled. Pt does not check CBG's at home. Per discussion, pt does not use canned/convenience foods often. Pt rarely adds salt to food. Pt eats out infrequently. Age-appropriate nutrition discussed. Pt expressed understanding of the information reviewed. Pt aware of nutrition education classes offered. Lab Results  Component Value Date   HGBA1C 6.5 04/04/2015   Nutrition Diagnosis ? Food-and nutrition-related knowledge deficit related to lack of exposure to information as related to diagnosis of: ? CVD ? DM  ? Obesity related to excessive energy intake as evidenced by a BMI of 31.5  Nutrition RX/ Estimated Daily Nutrition Needs for: wt loss 1250-1750 Kcal, 30-45 gm fat, 8-12 gm sat fat, 1.2-1.7 gm trans-fat, <1500 mg sodium, 150-175 gm CHO   Nutrition Intervention ? Pt's individual nutrition plan reviewed with pt. ? Benefits of adopting Therapeutic Lifestyle Changes discussed when Medficts reviewed. ? Pt to attend the Portion Distortion class ? Pt to attend the Diabetes Q & A class   ? Pt given handouts for: ? Nutrition I class ? Nutrition II class ? Diabetes Blitz Class ? Continue client-centered nutrition education by RD, as part of interdisciplinary care. Goal(s) ? Pt to identify food quantities necessary to achieve: ? wt loss to a goal wt of 154-172 lb (70.4-78.6 kg) at graduation from cardiac rehab.  ? CBG concentrations in the normal range or as close to normal as is safely possible. Monitor and Evaluate progress toward nutrition goal with team. Nutrition Risk: Change to Moderate Mickle PlumbEdna Ashiya Kinkead, M.Ed, RD, LDN, CDE 09/09/2015 12:04 PM

## 2015-09-14 ENCOUNTER — Encounter (HOSPITAL_COMMUNITY)
Admission: RE | Admit: 2015-09-14 | Discharge: 2015-09-14 | Disposition: A | Discharge status: Home / Self Care | Payer: Medicare Other | Source: Ambulatory Visit | Attending: Cardiology | Admitting: Cardiology

## 2015-09-14 DIAGNOSIS — Z955 Presence of coronary angioplasty implant and graft: Secondary | ICD-10-CM | POA: Insufficient documentation

## 2015-09-16 ENCOUNTER — Encounter (HOSPITAL_COMMUNITY)
Admission: RE | Admit: 2015-09-16 | Discharge: 2015-09-16 | Disposition: A | Discharge status: Home / Self Care | Payer: Medicare Other | Source: Ambulatory Visit | Attending: Cardiology | Admitting: Cardiology

## 2015-09-16 DIAGNOSIS — Z955 Presence of coronary angioplasty implant and graft: Secondary | ICD-10-CM | POA: Diagnosis not present

## 2015-09-19 ENCOUNTER — Encounter (HOSPITAL_COMMUNITY): Admission: RE | Admit: 2015-09-19 | Payer: Medicare Other | Source: Ambulatory Visit

## 2015-09-19 ENCOUNTER — Telehealth (HOSPITAL_COMMUNITY): Payer: Self-pay | Admitting: *Deleted

## 2015-09-20 ENCOUNTER — Ambulatory Visit: Payer: Medicare Other | Admitting: Internal Medicine

## 2015-09-21 ENCOUNTER — Encounter (HOSPITAL_COMMUNITY)
Admission: RE | Admit: 2015-09-21 | Discharge: 2015-09-21 | Disposition: A | Discharge status: Home / Self Care | Payer: Medicare Other | Source: Ambulatory Visit | Attending: Cardiology | Admitting: Cardiology

## 2015-09-21 DIAGNOSIS — Z955 Presence of coronary angioplasty implant and graft: Secondary | ICD-10-CM | POA: Diagnosis not present

## 2015-09-23 ENCOUNTER — Encounter (HOSPITAL_COMMUNITY)
Admission: RE | Admit: 2015-09-23 | Discharge: 2015-09-23 | Disposition: A | Discharge status: Home / Self Care | Payer: Medicare Other | Source: Ambulatory Visit | Attending: Cardiology | Admitting: Cardiology

## 2015-09-23 DIAGNOSIS — Z955 Presence of coronary angioplasty implant and graft: Secondary | ICD-10-CM | POA: Diagnosis not present

## 2015-09-26 ENCOUNTER — Encounter (HOSPITAL_COMMUNITY)
Admission: RE | Admit: 2015-09-26 | Discharge: 2015-09-26 | Disposition: A | Discharge status: Home / Self Care | Payer: Medicare Other | Source: Ambulatory Visit | Attending: Cardiology | Admitting: Cardiology

## 2015-09-26 DIAGNOSIS — Z955 Presence of coronary angioplasty implant and graft: Secondary | ICD-10-CM | POA: Diagnosis not present

## 2015-09-28 ENCOUNTER — Encounter (HOSPITAL_COMMUNITY)
Admission: RE | Admit: 2015-09-28 | Discharge: 2015-09-28 | Disposition: A | Discharge status: Home / Self Care | Payer: Medicare Other | Source: Ambulatory Visit | Attending: Cardiology | Admitting: Cardiology

## 2015-09-28 DIAGNOSIS — Z955 Presence of coronary angioplasty implant and graft: Secondary | ICD-10-CM | POA: Diagnosis not present

## 2015-09-30 ENCOUNTER — Encounter (HOSPITAL_COMMUNITY)
Admission: RE | Admit: 2015-09-30 | Discharge: 2015-09-30 | Disposition: A | Discharge status: Home / Self Care | Payer: Medicare Other | Source: Ambulatory Visit | Attending: Cardiology | Admitting: Cardiology

## 2015-09-30 DIAGNOSIS — Z955 Presence of coronary angioplasty implant and graft: Secondary | ICD-10-CM | POA: Diagnosis not present

## 2015-10-03 ENCOUNTER — Encounter (HOSPITAL_COMMUNITY)
Admission: RE | Admit: 2015-10-03 | Discharge: 2015-10-03 | Disposition: A | Discharge status: Home / Self Care | Payer: Medicare Other | Source: Ambulatory Visit | Attending: Cardiology | Admitting: Cardiology

## 2015-10-03 DIAGNOSIS — Z955 Presence of coronary angioplasty implant and graft: Secondary | ICD-10-CM | POA: Diagnosis not present

## 2015-10-04 ENCOUNTER — Other Ambulatory Visit: Payer: Self-pay | Admitting: Cardiology

## 2015-10-05 ENCOUNTER — Encounter (HOSPITAL_COMMUNITY): Payer: Medicare Other

## 2015-10-07 ENCOUNTER — Encounter (HOSPITAL_COMMUNITY)
Admission: RE | Admit: 2015-10-07 | Discharge: 2015-10-07 | Disposition: A | Discharge status: Home / Self Care | Payer: Medicare Other | Source: Ambulatory Visit | Attending: Cardiology | Admitting: Cardiology

## 2015-10-07 DIAGNOSIS — Z955 Presence of coronary angioplasty implant and graft: Secondary | ICD-10-CM | POA: Diagnosis not present

## 2015-10-10 ENCOUNTER — Encounter (HOSPITAL_COMMUNITY)
Admission: RE | Admit: 2015-10-10 | Discharge: 2015-10-10 | Disposition: A | Discharge status: Home / Self Care | Payer: Medicare Other | Source: Ambulatory Visit | Attending: Cardiology | Admitting: Cardiology

## 2015-10-10 DIAGNOSIS — Z955 Presence of coronary angioplasty implant and graft: Secondary | ICD-10-CM | POA: Diagnosis not present

## 2015-10-12 ENCOUNTER — Encounter (HOSPITAL_COMMUNITY)
Admission: RE | Admit: 2015-10-12 | Discharge: 2015-10-12 | Disposition: A | Discharge status: Home / Self Care | Payer: Medicare Other | Source: Ambulatory Visit | Attending: Cardiology | Admitting: Cardiology

## 2015-10-12 DIAGNOSIS — Z955 Presence of coronary angioplasty implant and graft: Secondary | ICD-10-CM | POA: Insufficient documentation

## 2015-10-13 ENCOUNTER — Encounter: Payer: Self-pay | Admitting: *Deleted

## 2015-10-13 ENCOUNTER — Encounter: Payer: Self-pay | Admitting: Cardiology

## 2015-10-13 ENCOUNTER — Ambulatory Visit (INDEPENDENT_AMBULATORY_CARE_PROVIDER_SITE_OTHER): Payer: Medicare Other | Admitting: Cardiology

## 2015-10-13 VITALS — BP 120/72 | HR 56 | Ht 64.0 in | Wt 181.1 lb

## 2015-10-13 DIAGNOSIS — I10 Essential (primary) hypertension: Secondary | ICD-10-CM | POA: Diagnosis not present

## 2015-10-13 DIAGNOSIS — I350 Nonrheumatic aortic (valve) stenosis: Secondary | ICD-10-CM | POA: Diagnosis not present

## 2015-10-13 DIAGNOSIS — I25118 Atherosclerotic heart disease of native coronary artery with other forms of angina pectoris: Secondary | ICD-10-CM

## 2015-10-13 LAB — BASIC METABOLIC PANEL
BUN: 18 mg/dL (ref 7–25)
CALCIUM: 8.9 mg/dL (ref 8.6–10.3)
CO2: 25 mmol/L (ref 20–31)
Chloride: 104 mmol/L (ref 98–110)
Creat: 1.12 mg/dL — ABNORMAL HIGH (ref 0.70–1.11)
GLUCOSE: 91 mg/dL (ref 65–99)
POTASSIUM: 4.2 mmol/L (ref 3.5–5.3)
SODIUM: 138 mmol/L (ref 135–146)

## 2015-10-13 NOTE — Progress Notes (Signed)
Patient ID: Ronnie Boyd, male   DOB: 1926/11/07, 82 y.o.   MRN: [id number] PCP: Dr. Alwyn Ren  82 yo with HTN, diet-controlled diabetes, hyperlipidemia, moderate aortic stenosis, CVA, CAD, and PAD presents for cardiology followup.  Patient was admitted in 6/16 with small right medial thalamic/mid brain lacunar infarct manifesting with balance difficulty and slight drooping of the right eyelid.  Strokes signs completely resolved with no residual defect.  He had a carotid US in 6/16 without significant stenosis.    Patient also had been noting tightness in his chest with moderate exertion (yardwork, carrying a load).  Echo showed normal EF and moderate AS in 6/16.  He had an abnormal Cardiolite which was followed by [facility] in 7/16 showing significant 3 vessel disease.  He was started on Imdur and metoprolol was increased.  He was referred to cardiac surgery for evaluation as CABG was thought to be most likely to provide complete revascularization in this situation.   He saw Dr Dorris Fetch.  After discussion with neurology, it was decided that as long as he remains symptomatically stable, he should not have CABG until 3 months after CVA.  Ultimately, we elected to treat him percutaneously.  He had Synergy DES biodegradable stents to the mLAD, dLAD, and OM1.   Currently, he is doing well.  He is really not having much in the way of chest pain.  Doing moderate exercise without problems.  No NTG use.  No dyspnea except with heavy exertion. He also reports bilateral calf pain with ambulation which is stable (walking about 100 yards, L>R) => he had peripheral arterial dopplers with below-the-knee disease, plan to manage medically. BP ok today.  Labs (3/12): K 3.9, creatinine 1.0, TSH normal, LDL 150, HDL 76 Labs (6/12): LFTs normal, LDL 91, HDL 71 Labs (8/12): LDL 62, HDL 75 LBS (10/12): K 3.6, creatinine 1.1 Labs (5/13): LDL 75, HDL 73 Labs (10/13): K 3.7, creatinine 1.1 Labs (5/14): LDL 81, HDL 76, K 3.7,  creatinine 1.1 Labs (7/16): K 3.7, creatinine 1.27, LDL 74, LDL-P 724 Labs (9/16): K 4.5, creatinine 1.33, HCT 42.8  PMH: 1. HTN 2. Hyperlipidemia: Myalgias with Crestor.  3. Diabetes mellitus: diet-controlled 4. Allergic rhinitis 5. CAD: Myoview (4/05) with no ischemia or infarction.  LHC (7/16) with 60% pLAD, 80% mLAD, 80-90% dLAD, 70+% proximal LCx after high OM, 90% proximal high OM with diffuse severe distal disease, nondominant RCA with 80% ostial stenosis, 90% proximal stenosis, and 90% mid vessel stenosis.  9/16 PCI with Synergy biodegradable DES to dLAD, mLAD, and OM1.  6. PAD: ABIs 2005 with 0.92 on right, 0.78 on left.  Peripheral arterial evaluation (5/12): ABI 0.82 right/0.79 left, TBI 0.69 right/0.54 left (not at risk of tissue loss).  ABIs (12/13) with 0.84 right, 0.97 left (improved).  Peripheral arterial dopplers (8/16) with ABI 0.76 on right, 0.73 on left => occluded bilateral anterior tibial arteries and left posterior tibial artery.  7. GERD 8. Prostatitis 9. S/p inguinal hernia repair. 10. Aortic stenosis: Moderate by echo.  Echo (3/12) with mild LVH, mild to moderate focal basal septal hypertrophy with no LV outflow tract gradient, mild aortic stenosis, normal RV size and systolic function.  Echo (6/16) with EF 65-70%, moderate AS with mean gradient 28 mmHg, AVA 1.12 cm^2.  11. CVA (6/16): Right medial thalamic/midbrain lacunar infarct.  6/16 carotid dopplers without significant stenosis.  12. OSA: CPAP recommended.   SH: Retired, married, never smoked.    FH: Father with CVA in his 37s, no  premature CAD, no AAA  ROS: All systems reviewed and negative except as per HPI.    Current Outpatient Prescriptions  Medication Sig Dispense Refill  . alum & mag hydroxide-simeth (MAALOX/MYLANTA) 200-200-20 MG/5ML suspension Take 15 mLs by mouth every 6 (six) hours as needed for indigestion or heartburn.    Marland Kitchen amLODipine (NORVASC) 10 MG tablet Take 1 tablet (10 mg total) by mouth  daily. 90 tablet 3  . aspirin EC 81 MG tablet Take 81 mg by mouth daily.    . Azelastine-Fluticasone (DYMISTA) 137-50 MCG/ACT SUSP Place 1 spray into the nose daily as needed (allergies).    . clopidogrel (PLAVIX) 75 MG tablet Take 1 tablet (75 mg total) by mouth daily. 30 tablet 11  . Coenzyme Q10 200 MG capsule Take 200 mg by mouth daily. Take one daily    . Garlic Oil 1000 MG CAPS Take 1 capsule by mouth daily.      . isosorbide mononitrate (IMDUR) 60 MG 24 hr tablet Take 1 tablet (60 mg total) by mouth daily. 90 tablet 3  . loratadine (CLARITIN) 10 MG tablet Take 10 mg by mouth daily as needed for rhinitis.     . metoprolol tartrate (LOPRESSOR) 25 MG tablet Take 25 mg by mouth 2 (two) times daily.    . Multiple Vitamins-Minerals (CENTRUM SILVER) tablet Take 1 tablet by mouth daily.      . nitroGLYCERIN (NITROSTAT) 0.4 MG SL tablet Place 1 tablet (0.4 mg total) under the tongue every 5 (five) minutes as needed for chest pain. 25 tablet 3  . Omega-3 Fatty Acids (FISH OIL) 1000 MG CAPS Take 1 capsule by mouth daily.      . pantoprazole (PROTONIX) 40 MG tablet TAKE 1 TABLET (40 MG TOTAL) BY MOUTH DAILY. 30 tablet 3  . potassium chloride SA (KLOR-CON M20) 20 MEQ tablet Take 1 tablet (20 mEq total) by mouth daily. 90 tablet 3  . pravastatin (PRAVACHOL) 40 MG tablet Take 1 tablet (40 mg total) by mouth every evening. 90 tablet 1  . tamsulosin (FLOMAX) 0.4 MG CAPS capsule Take 0.4 mg by mouth daily.    Marland Kitchen telmisartan-hydrochlorothiazide (MICARDIS HCT) 80-25 MG per tablet Take 1/2 tablet by mouth daily 90 tablet 3   No current facility-administered medications for this visit.    BP 120/72 mmHg  Pulse 56  Ht  (1.626 m)  Wt 181 lb 1.9 oz (82.155 kg)  BMI 31.07 kg/m2  SpO2 97% General: NAD Neck: JVP 7 cm, no thyromegaly or thyroid nodule.  Lungs: Clear to auscultation bilaterally with normal respiratory effort. CV: Nondisplaced PMI.  Heart regular S1/S2, no S3/S4, 3/6 systolic  crescendo-decrescendo murmur RUSB with some obscuring of S2 and radiation of murmur to neck.  1+ ankle edema.  1+ left PT pulse, difficult to palpate R PT pulse.  Abdomen: Soft, nontender, no hepatosplenomegaly, no distention.  Neurologic: Alert and oriented x 3.  Psych: Normal affect. Extremities: No clubbing or cyanosis.   Assessment/Plan: 1. CAD: Diffuse severe 3 vessel disease.  Now s/p PCI in 9/16 with Synergy biodegradable stents to the mLAD, dLAD, and OM1.  He seems to be doing well since then with minimal angina.   - Continue Imdur, metoprolol, pravastatin, ASA 81, and Plavix 75 daily.  He will need DAPT for at least a year. Will likely continue Plavix longer if tolerated given CVA.   - Continue cardiac rehab. 2. Aortic stenosis: Moderate on last echo.  He would likely be a TAVR candidate in  future if AS worsens.   - Repeat echo in 6/17.  3. CVA: 6/16 lacunar CVA, followed by neurology, on Plavix/ASA.  4. PAD: Bilateral calf pain after walking about 100 yards. Extensive below-knee PAD.  Medical management.  Continue statin, Plavix, and ASA.   5. Hyperlipidemia: Continue pravastatin 40 mg daily.  Good lipids in 7/16.  6. HTN: BP controlled.  7. OSA: Use CPAP.    Marca Ancona 10/13/2015

## 2015-10-13 NOTE — Patient Instructions (Signed)
Medication Instructions:  Your physician recommends that you continue on your current medications as directed. Please refer to the Current Medication list given to you today.   Labwork: BMET today  Testing/Procedures: Your physician has requested that you have an echocardiogram. Echocardiography is a painless test that uses sound waves to create images of your heart. It provides your doctor with information about the size and shape of your heart and how well your heart's chambers and valves are working. This procedure takes approximately one hour. There are no restrictions for this procedure. June 2017    Follow-Up: Your physician wants you to follow-up in: 6 months with Dr Shirlee Latch. (August 2017). You will receive a reminder letter in the mail two months in advance. If you don't receive a letter, please call our office to schedule the follow-up appointment.   Any Other Special Instructions Will Be Listed Below (If Applicable).     If you need a refill on your cardiac medications before your next appointment, please call your pharmacy.

## 2015-10-14 ENCOUNTER — Encounter (HOSPITAL_COMMUNITY)
Admission: RE | Admit: 2015-10-14 | Discharge: 2015-10-14 | Disposition: A | Discharge status: Home / Self Care | Payer: Medicare Other | Source: Ambulatory Visit | Attending: Cardiology | Admitting: Cardiology

## 2015-10-14 DIAGNOSIS — Z955 Presence of coronary angioplasty implant and graft: Secondary | ICD-10-CM | POA: Diagnosis not present

## 2015-10-17 ENCOUNTER — Other Ambulatory Visit: Payer: Self-pay | Admitting: Cardiology

## 2015-10-17 ENCOUNTER — Encounter (HOSPITAL_COMMUNITY)
Admission: RE | Admit: 2015-10-17 | Discharge: 2015-10-17 | Disposition: A | Discharge status: Home / Self Care | Payer: Medicare Other | Source: Ambulatory Visit | Attending: Cardiology | Admitting: Cardiology

## 2015-10-17 DIAGNOSIS — Z955 Presence of coronary angioplasty implant and graft: Secondary | ICD-10-CM | POA: Diagnosis not present

## 2015-10-18 ENCOUNTER — Other Ambulatory Visit: Payer: Self-pay | Admitting: Cardiology

## 2015-10-18 MED ORDER — PANTOPRAZOLE SODIUM 40 MG PO TBEC: 40.0000 mg | DELAYED_RELEASE_TABLET | Freq: Every day | ORAL | Status: DC

## 2015-10-19 ENCOUNTER — Encounter (HOSPITAL_COMMUNITY)
Admission: RE | Admit: 2015-10-19 | Discharge: 2015-10-19 | Disposition: A | Discharge status: Home / Self Care | Payer: Medicare Other | Source: Ambulatory Visit | Attending: Cardiology | Admitting: Cardiology

## 2015-10-19 DIAGNOSIS — Z955 Presence of coronary angioplasty implant and graft: Secondary | ICD-10-CM | POA: Diagnosis not present

## 2015-10-21 ENCOUNTER — Telehealth (HOSPITAL_COMMUNITY): Payer: Self-pay | Admitting: Internal Medicine

## 2015-10-21 ENCOUNTER — Encounter (HOSPITAL_COMMUNITY): Payer: Medicare Other

## 2015-10-24 ENCOUNTER — Encounter (HOSPITAL_COMMUNITY)
Admission: RE | Admit: 2015-10-24 | Discharge: 2015-10-24 | Disposition: A | Discharge status: Home / Self Care | Payer: Medicare Other | Source: Ambulatory Visit | Attending: Cardiology | Admitting: Cardiology

## 2015-10-24 DIAGNOSIS — Z955 Presence of coronary angioplasty implant and graft: Secondary | ICD-10-CM | POA: Diagnosis not present

## 2015-10-26 ENCOUNTER — Encounter (HOSPITAL_COMMUNITY)
Admission: RE | Admit: 2015-10-26 | Discharge: 2015-10-26 | Disposition: A | Discharge status: Home / Self Care | Payer: Medicare Other | Source: Ambulatory Visit | Attending: Cardiology | Admitting: Cardiology

## 2015-10-26 DIAGNOSIS — Z955 Presence of coronary angioplasty implant and graft: Secondary | ICD-10-CM | POA: Diagnosis not present

## 2015-10-28 ENCOUNTER — Encounter (HOSPITAL_COMMUNITY)
Admission: RE | Admit: 2015-10-28 | Discharge: 2015-10-28 | Disposition: A | Discharge status: Home / Self Care | Payer: Medicare Other | Source: Ambulatory Visit | Attending: Cardiology | Admitting: Cardiology

## 2015-10-28 DIAGNOSIS — Z955 Presence of coronary angioplasty implant and graft: Secondary | ICD-10-CM | POA: Diagnosis not present

## 2015-10-31 ENCOUNTER — Encounter (HOSPITAL_COMMUNITY)
Admission: RE | Admit: 2015-10-31 | Discharge: 2015-10-31 | Disposition: A | Discharge status: Home / Self Care | Payer: Medicare Other | Source: Ambulatory Visit | Attending: Cardiology | Admitting: Cardiology

## 2015-10-31 DIAGNOSIS — Z955 Presence of coronary angioplasty implant and graft: Secondary | ICD-10-CM | POA: Diagnosis not present

## 2015-11-02 ENCOUNTER — Encounter (HOSPITAL_COMMUNITY)
Admission: RE | Admit: 2015-11-02 | Discharge: 2015-11-02 | Disposition: A | Discharge status: Home / Self Care | Payer: Medicare Other | Source: Ambulatory Visit | Attending: Cardiology | Admitting: Cardiology

## 2015-11-02 DIAGNOSIS — Z955 Presence of coronary angioplasty implant and graft: Secondary | ICD-10-CM | POA: Diagnosis not present

## 2015-11-02 NOTE — Progress Notes (Signed)
Pt graduated from cardiac rehab program today with completion of 36 exercise sessions in Phase II. Pt maintained good attendance to both exercise and to educational classes.  Pt  progressed nicely during his participation in rehab as evidenced by increased MET level.  Pt increased his MET level from 2.7 to 3.1.   Medication list reconciled. Repeat  PHQ score-0  .  Pt has made significant lifestyle changes and should be commended for his success. Pt feels he has achieved his goals during cardiac rehab. Pt has developed exercise routine and better health.  Pt has had difficulty losing weight. Pt has had some weight loss but has gained this back.  Pt blames his wife for portion sizes and food choice.  Pt plans to make more conscious choice with his food.   Pt plans to continue exercise in cardiac maintenance program at a later time but will plan to walk every day for his home exercise. Cherre Huger, BSN

## 2015-11-04 ENCOUNTER — Encounter: Payer: Self-pay | Admitting: Internal Medicine

## 2015-11-04 ENCOUNTER — Ambulatory Visit (INDEPENDENT_AMBULATORY_CARE_PROVIDER_SITE_OTHER): Payer: Medicare Other | Admitting: Internal Medicine

## 2015-11-04 ENCOUNTER — Encounter (HOSPITAL_COMMUNITY): Payer: Medicare Other

## 2015-11-04 VITALS — BP 106/48 | HR 46 | Temp 98.0°F | Resp 16 | Wt 181.0 lb

## 2015-11-04 DIAGNOSIS — I10 Essential (primary) hypertension: Secondary | ICD-10-CM | POA: Diagnosis not present

## 2015-11-04 DIAGNOSIS — K219 Gastro-esophageal reflux disease without esophagitis: Secondary | ICD-10-CM | POA: Diagnosis not present

## 2015-11-04 DIAGNOSIS — I739 Peripheral vascular disease, unspecified: Secondary | ICD-10-CM

## 2015-11-04 DIAGNOSIS — R739 Hyperglycemia, unspecified: Secondary | ICD-10-CM

## 2015-11-04 DIAGNOSIS — E7800 Pure hypercholesterolemia, unspecified: Secondary | ICD-10-CM

## 2015-11-04 DIAGNOSIS — I25118 Atherosclerotic heart disease of native coronary artery with other forms of angina pectoris: Secondary | ICD-10-CM | POA: Diagnosis not present

## 2015-11-04 NOTE — Progress Notes (Signed)
Pre visit review using our clinic review tool, if applicable. No additional management support is needed unless otherwise documented below in the visit note. 

## 2015-11-04 NOTE — Progress Notes (Signed)
Subjective:    Patient ID: Ronnie Boyd, male    DOB: 02-10-1927, 82 y.o.   MRN: [id number]  HPI  He is here to establish with a new primary care physician.  CAD, htn, hyperlipidemia: He is taking his medication daily. He is compliant with a low sodium diet.  He has been experiencing some chest tightness and shortness of breath with exertion, which improves if he rests. He was doing cardiac rehabilitation 3 times a week, but that has ended and he has not been exercising regularly since. When his coronary artery disease was detected it was due to chest tightness and cannot pain. He does follow with cardiology, but his next appointment is in the summer. He denies chest pain, palpitations, edema and regular headaches. He is not exercising regularly.    GERD:  He is taking his medication daily as prescribed.  He denies any GERD symptoms and feels his GERD is well controlled.    Medications and allergies reviewed with patient and updated if appropriate.  Patient Active Problem List   Diagnosis Date Noted  . OSA on CPAP 07/20/2015  . Coronary artery disease involving native coronary artery with other forms of angina pectoris (HCC)   . OSA (obstructive sleep apnea) 05/23/2015  . Arterial ischemic stroke, vertebrobasilar, thalamic, chronic 03/24/2015  . Unspecified sinusitis (chronic) 07/07/2013  . Aortic stenosis 01/08/2011  . Hyperglycemia 12/02/2007  . HYPERCHOLESTEROLEMIA 12/01/2007  . Essential hypertension 12/01/2007  . Peripheral vascular disease (HCC) 12/01/2007  . HEMORRHOIDS 12/01/2007  . ALLERGIC RHINITIS 12/01/2007  . GERD 12/01/2007  . DEGENERATIVE JOINT DISEASE 12/01/2007  . PROSTATITIS, HX OF 12/01/2007    Current Outpatient Prescriptions on File Prior to Visit  Medication Sig Dispense Refill  . alum & mag hydroxide-simeth (MAALOX/MYLANTA) 200-200-20 MG/5ML suspension Take 15 mLs by mouth every 6 (six) hours as needed for indigestion or heartburn.    Marland Kitchen amLODipine  (NORVASC) 10 MG tablet Take 1 tablet (10 mg total) by mouth daily. 90 tablet 3  . aspirin EC 81 MG tablet Take 81 mg by mouth daily.    . Azelastine-Fluticasone (DYMISTA) 137-50 MCG/ACT SUSP Place 1 spray into the nose daily as needed (allergies).    . clopidogrel (PLAVIX) 75 MG tablet Take 1 tablet (75 mg total) by mouth daily. 30 tablet 11  . Coenzyme Q10 200 MG capsule Take 200 mg by mouth daily. Take one daily    . Garlic Oil 1000 MG CAPS Take 1 capsule by mouth daily.      . isosorbide mononitrate (IMDUR) 60 MG 24 hr tablet Take 1 tablet (60 mg total) by mouth daily. 90 tablet 3  . loratadine (CLARITIN) 10 MG tablet Take 10 mg by mouth daily as needed for rhinitis.     . metoprolol tartrate (LOPRESSOR) 25 MG tablet Take 25 mg by mouth 2 (two) times daily.    . Multiple Vitamins-Minerals (CENTRUM SILVER) tablet Take 1 tablet by mouth daily.      . nitroGLYCERIN (NITROSTAT) 0.4 MG SL tablet Place 1 tablet (0.4 mg total) under the tongue every 5 (five) minutes as needed for chest pain. 25 tablet 3  . Omega-3 Fatty Acids (FISH OIL) 1000 MG CAPS Take 1 capsule by mouth daily.      . pantoprazole (PROTONIX) 40 MG tablet Take 1 tablet (40 mg total) by mouth daily. 30 tablet 5  . potassium chloride SA (KLOR-CON M20) 20 MEQ tablet Take 1 tablet (20 mEq total) by mouth daily. 90 tablet  3  . pravastatin (PRAVACHOL) 40 MG tablet TAKE 1 TABLET (40 MG TOTAL) BY MOUTH EVERY EVENING. 90 tablet 3  . tamsulosin (FLOMAX) 0.4 MG CAPS capsule Take 0.4 mg by mouth daily.    Marland Kitchen telmisartan-hydrochlorothiazide (MICARDIS HCT) 80-25 MG per tablet Take 1/2 tablet by mouth daily 90 tablet 3   No current facility-administered medications on file prior to visit.    Past Medical History  Diagnosis Date  . Allergic rhinitis   . Hypertension   . PVD (peripheral vascular disease) (HCC)   . Hypercholesteremia   . GERD (gastroesophageal reflux disease)   . Hemorrhoids   . Prostatitis     hx of  . Aortic stenosis      mild echo 7/14  . Acid reflux   . Family history of adverse reaction to anesthesia     "daughter had problems when she was a teen; none since; it was related to asthma"  . Heart murmur   . OSA on CPAP 05/23/2015    "just started wearing mas" (06/01/2015)  . Borderline type 2 diabetes mellitus   . History of hiatal hernia     "dx'd years ago" (06/01/2015)  . Stroke (HCC) 02/09/2015    denies residual on 06/01/2015  . Degenerative joint disease   . Arthritis     "right shoulder" (06/01/2015)    Past Surgical History  Procedure Laterality Date  . Carpal tunnel release Bilateral 2006-2007  . Excisional hemorrhoidectomy  1989  . Cardiac catheterization [name] 03/18/2015    Procedure: Left Heart Cath and Coronary Angiography;  Surgeon: Laurey Morale, MD;  Location: [facility] INVASIVE CV LAB;  Service: Cardiovascular;  Laterality: [name];  . Cardiac catheterization [name] 06/01/2015    Procedure: Coronary Stent Intervention;  Surgeon: Iran Ouch, MD;  Location: [facility] INVASIVE CV LAB;  Service: Cardiovascular;  Laterality: [name];  . Cataract extraction w/ intraocular lens  implant, bilateral Bilateral ~ 2003    Social History   Social History  . Marital Status: Married    Spouse Name: [name]  . Number of Children: [name]  . Years of Education: [name]   Occupational History  . retired    Social History Main Topics  . Smoking status: Never Smoker   . Smokeless tobacco: Never Used  . Alcohol Use: No  . Drug Use: No  . Sexual Activity: Not Currently   Other Topics Concern  . None   Social History Narrative    Family History  Problem Relation Age of Onset  . Arthritis    . Hypertension Mother   . Diabetes Mother 76  . Stroke Father 49  . Diabetes Brother     Review of Systems  Constitutional: Negative for fever.  Respiratory: Positive for chest tightness (with exertion) and shortness of breath (with exertion). Negative for cough and wheezing.   Cardiovascular: Negative for chest pain, palpitations  and leg swelling.  Gastrointestinal: Negative for nausea and abdominal pain.       GERD related to certain foods  Neurological: Negative for light-headedness and headaches.       Objective:   Filed Vitals:   11/04/15 1441  BP: 106/48  Pulse: 46  Temp: 98 F (36.7 C)  Resp: 16   Filed Weights   11/04/15 1441  Weight: 181 lb (82.101 kg)   Body mass index is 31.05 kg/(m^2).   Physical Exam Constitutional: Appears well-developed and well-nourished. No distress.  Neck: Neck supple. No tracheal deviation present. No thyromegaly present.  No carotid  bruit. No cervical adenopathy.   Cardiovascular: Normal rate, regular rhythm and normal heart sounds.   2/6 systolic murmur.  !+ edema Pulmonary/Chest: Effort normal and breath sounds normal. No respiratory distress. No wheezes.        Assessment & Plan:   See Problem List for Assessment and Plan of chronic medical problems.  He'll get blood work done when he is able-he needs to fast  Stressed the importance of him going his cardiologist to discuss the symptoms he is experiencing-chest tightness and shortness of breath  Follow-up every 6 months

## 2015-11-04 NOTE — Patient Instructions (Addendum)
  We have reviewed your prior records including labs and tests today.  Test(s) ordered today. Your results will be released to MyChart (or called to you) after review, usually within 72hours after test completion. If any changes need to be made, you will be notified at that same time.   Medications reviewed and updated.  No changes recommended at this time.  Call and schedule an appointment with cardiology.    Please schedule followup in 6 months

## 2015-11-06 NOTE — Assessment & Plan Note (Signed)
He is experiencing chest tightness and shortness of breath with exertion He is not exercising regularly-cardiac rehabilitation has ended knee does not think he is going to exercise on his own Stressed the importance of regular exercise, but since he is experiencing similar symptoms to what he was when his heart disease was discovered and recommended that he call his cardiologist and move up his appointment to be evaluated. He does have known cardiac disease and this may be causing his symptoms

## 2015-11-06 NOTE — Assessment & Plan Note (Signed)
BP Readings from Last 3 Encounters:  11/04/15 106/48  10/13/15 120/72  08/30/15 160/62   Blood pressure well-controlled here-on the low side actually Continue current medications Stressed the importance of regular exercise after seeing cardiology

## 2015-11-06 NOTE — Assessment & Plan Note (Signed)
Check lipid panel, CMP Taking pravastatin 40 mg-may potentially need a stronger statin

## 2015-11-06 NOTE — Assessment & Plan Note (Signed)
GERD controlled Continue daily medication  

## 2015-11-06 NOTE — Assessment & Plan Note (Signed)
Lab Results  Component Value Date   HGBA1C 6.5 04/04/2015   When his A1c was last checked was technically in the diabetic range Stressed low sugar, carbohydrate diet, regular exercise and ideally weight loss Recheck A1c Follow-up every 6 months to monitor sugar closely

## 2015-11-07 ENCOUNTER — Encounter (HOSPITAL_COMMUNITY): Payer: Medicare Other

## 2015-11-07 ENCOUNTER — Telehealth: Payer: Self-pay | Admitting: Cardiology

## 2015-11-07 NOTE — Progress Notes (Signed)
Cardiology Office Note:    Date:  11/08/2015   ID:  Ronnie Boyd, DOB 1927-06-16, MRN [id number]  PCP:  Pincus Sanes, MD  Cardiologist:  Dr. Marca Ancona   PV:  Dr. Lorine Bears   Chief Complaint  Patient presents with  . Chest Pain    History of Present Illness:     Ronnie Boyd is a 82 y.o. male with a hx of HTN, diet-controlled diabetes, hyperlipidemia, moderate aortic stenosis, CVA, CAD, and PAD. Patient was admitted in 6/16 with small right medial thalamic/mid brain lacunar infarct manifesting with balance difficulty and slight drooping of the right eyelid. Strokes signs completely resolved with no residual defect. He had a carotid US in 6/16 without significant stenosis.   In 6/16, the patient also noted tightness in his chest with moderate exertion (yardwork, carrying a load). Echo showed normal EF and moderate AS in 6/16. He had an abnormal Cardiolite which was followed by LHC in 7/16 showing significant 3 vessel disease. He was started on Imdur and metoprolol was increased. He was referred to cardiac surgery for evaluation as CABG was thought to be most likely to provide complete revascularization in this situation. He saw Dr Dorris Fetch. After discussion with neurology, it was decided that as long as he remains symptomatically stable, he should not have CABG until 3 months after CVA. Ultimately, it was elected to treat him percutaneously. He had Synergy DES biodegradable stents to the mLAD, dLAD, and OM1.   Last seen by Dr. Shirlee Latch 10/13/15. He reported doing moderate exercise without angina. He did report bilateral claudication which was stable (walking about 100 yards, L>R) => he had peripheral arterial dopplers with below-the-knee disease, plan to manage medically.   He saw primary care 11/04/15 and reported exertional chest discomfort with associated dyspnea. He was asked to follow-up with cardiology.  He has started to notice exertional anterior chest tightness  and dyspnea over the past 2-3 weeks. He describes CCS Class 2-3 angina. He does not feel that his symptoms are worse.  He has not taken NTG. His symptoms are similar to his prior angina but not as bad. Denies assoc nausea, diaphoresis.  Denies syncope.  Denies orthopnea, PND.  He has LE edema that is unchanged.  He is concerned about his blood pressure running low. He denies dizziness, near-syncope or syncope. He does note that he feels tired. Of note, he sleeps with CPAP.   Past Medical History  Diagnosis Date  . Allergic rhinitis   . Hypertension   . PVD (peripheral vascular disease) (HCC)   . Hypercholesteremia   . GERD (gastroesophageal reflux disease)   . Hemorrhoids   . Prostatitis     hx of  . Aortic stenosis     mild echo 7/14  . Acid reflux   . Family history of adverse reaction to anesthesia     "daughter had problems when she was a teen; none since; it was related to asthma"  . Heart murmur   . OSA on CPAP 05/23/2015    "just started wearing mas" (06/01/2015)  . Borderline type 2 diabetes mellitus   . History of hiatal hernia     "dx'd years ago" (06/01/2015)  . Stroke (HCC) 02/09/2015    denies residual on 06/01/2015  . Degenerative joint disease   . Arthritis     "right shoulder" (06/01/2015)  1. HTN 2. Hyperlipidemia: Myalgias with Crestor.  3. Diabetes mellitus: diet-controlled 4. Allergic rhinitis 5. CAD: Myoview (4/05) with no  ischemia or infarction. LHC (7/16) with 60% pLAD, 80% mLAD, 80-90% dLAD, 70+% proximal LCx after high OM, 90% proximal high OM with diffuse severe distal disease, nondominant RCA with 80% ostial stenosis, 90% proximal stenosis, and 90% mid vessel stenosis. 9/16 PCI with Synergy biodegradable DES to dLAD, mLAD, and OM1.  6. PAD: ABIs 2005 with 0.92 on right, 0.78 on left. Peripheral arterial evaluation (5/12): ABI 0.82 right/0.79 left, TBI 0.69 right/0.54 left (not at risk of tissue loss). ABIs (12/13) with 0.84 right, 0.97 left (improved).  Peripheral arterial dopplers (8/16) with ABI 0.76 on right, 0.73 on left => occluded bilateral anterior tibial arteries and left posterior tibial artery.  7. GERD 8. Prostatitis 9. S/p inguinal hernia repair. 10. Aortic stenosis: Moderate by echo. Echo (3/12) with mild LVH, mild to moderate focal basal septal hypertrophy with no LV outflow tract gradient, mild aortic stenosis, normal RV size and systolic function. Echo (6/16) with EF 65-70%, moderate AS with mean gradient 28 mmHg, AVA 1.12 cm^2.  11. CVA (6/16): Right medial thalamic/midbrain lacunar infarct. 6/16 carotid dopplers without significant stenosis.  12. OSA: CPAP recommended.    Past Surgical History  Procedure Laterality Date  . Carpal tunnel release Bilateral 2006-2007  . Excisional hemorrhoidectomy  1989  . Cardiac catheterization [name] 03/18/2015    Procedure: Left Heart Cath and Coronary Angiography;  Surgeon: Laurey Morale, MD;  [facility] INVAS[facility] CV LAB;  Service: Cardiovascular;  Laterality: [name];  . Cardiac catheterization [name] 06/01/2015    Procedure: Coronary Stent Intervention;  Surgeon: Iran Ouch, MD;  Location: [facility] INVASIVE CV LAB;  Service: Cardiovascular;  Laterality: [name];  . Cataract extraction w/ intraocular lens  implant, bilateral Bilateral ~ 2003    Current Medications: Outpatient Prescriptions Prior to Visit  Medication Sig Dispense Refill  . alum & mag hydroxide-simeth (MAALOX/MYLANTA) 200-200-20 MG/5ML suspension Take 15 mLs by mouth every 6 (six) hours as needed for indigestion or heartburn.    Marland Kitchen amLODipine (NORVASC) 10 MG tablet Take 1 tablet (10 mg total) by mouth daily. 90 tablet 3  . aspirin EC 81 MG tablet Take 81 mg by mouth daily.    . Azelastine-Fluticasone (DYMISTA) 137-50 MCG/ACT SUSP Place 1 spray into the nose daily as needed (allergies).    . clopidogrel (PLAVIX) 75 MG tablet Take 1 tablet (75 mg total) by mouth daily. 30 tablet 11  . Coenzyme Q10 200 MG capsule Take 200 mg by  mouth daily. Take one daily    . Garlic Oil 1000 MG CAPS Take 1 capsule by mouth daily.      . isosorbide mononitrate (IMDUR) 60 MG 24 hr tablet Take 1 tablet (60 mg total) by mouth daily. 90 tablet 3  . loratadine (CLARITIN) 10 MG tablet Take 10 mg by mouth daily as needed for rhinitis.     . metoprolol tartrate (LOPRESSOR) 25 MG tablet Take 25 mg by mouth 2 (two) times daily.    . Multiple Vitamins-Minerals (CENTRUM SILVER) tablet Take 1 tablet by mouth daily.      . nitroGLYCERIN (NITROSTAT) 0.4 MG SL tablet Place 1 tablet (0.4 mg total) under the tongue every 5 (five) minutes as needed for chest pain. 25 tablet 3  . Omega-3 Fatty Acids (FISH OIL) 1000 MG CAPS Take 1 capsule by mouth daily.      . pantoprazole (PROTONIX) 40 MG tablet Take 1 tablet (40 mg total) by mouth daily. 30 tablet 5  . potassium chloride SA (KLOR-CON M20) 20 MEQ tablet Take  1 tablet (20 mEq total) by mouth daily. 90 tablet 3  . pravastatin (PRAVACHOL) 40 MG tablet TAKE 1 TABLET (40 MG TOTAL) BY MOUTH EVERY EVENING. 90 tablet 3  . tamsulosin (FLOMAX) 0.4 MG CAPS capsule Take 0.4 mg by mouth daily.    Marland Kitchen telmisartan-hydrochlorothiazide (MICARDIS HCT) 80-25 MG per tablet Take 1/2 tablet by mouth daily 90 tablet 3   No facility-administered medications prior to visit.     Allergies:   Review of patient's allergies indicates no known allergies.   Social History   Social History  . Marital Status: Married    Spouse Name: [name]  . Number of Children: [name]  . Years of Education: [name]   Occupational History  . retired    Social History Main Topics  . Smoking status: Never Smoker   . Smokeless tobacco: Never Used  . Alcohol Use: No  . Drug Use: No  . Sexual Activity: Not Currently   Other Topics Concern  . None   Social History Narrative     Family History:  The patient's family history includes Diabetes in his brother; Diabetes (age of onset: 45) in his mother; Hypertension in his mother; Stroke (age of onset:  80) in his father.   ROS:   Please see the history of present illness.    Review of Systems  Cardiovascular: Positive for chest pain.  Musculoskeletal: Positive for joint pain.    All other systems reviewed and are negative.   Physical Exam:    VS:  BP 118/60 mmHg  Pulse 60  Ht  (1.626 m)  Wt 182 lb 6.4 oz (82.736 kg)  BMI 31.29 kg/m2   GEN: Well nourished, well developed, in no acute distress HEENT: normal Neck: no JVD, no masses Cardiac: Normal S1/S2, RRR; 2/6 systolic murmur at the RUSB, trace bilateral LE edema   Respiratory:  clear to auscultation bilaterally; no wheezing, rhonchi or rales GI: soft, nontender  MS: no deformity or atrophy Skin: warm and dry  Neuro:    no focal deficits  Psych: Alert and oriented x 3, normal affect  Wt Readings from Last 3 Encounters:  11/08/15 182 lb 6.4 oz (82.736 kg)  11/04/15 181 lb (82.101 kg)  10/13/15 181 lb 1.9 oz (82.155 kg)      Studies/Labs Reviewed:     EKG:  EKG is  ordered today.  The ekg ordered today demonstrates NSR, HR 63, normal axis, nonspecific ST-T wave changes, first-degree AV block, PR 240 ms, QTC 403 ms, PVC, no significant change when compared to prior tracing  Recent Labs: 11/08/2015: BUN 15; Creat 1.37*; Hemoglobin 13.7; Platelets 163; Potassium 4.4; Sodium 137   Recent Lipid Panel    Component Value Date/Time   CHOL 164 04/04/2015 1059   CHOL 240* 12/23/2014 1209   TRIG 42 04/04/2015 1059   TRIG 51.0 12/23/2014 1209   HDL 82 04/04/2015 1059   HDL 78.00 12/23/2014 1209   CHOLHDL 3 12/23/2014 1209   VLDL 10.2 12/23/2014 1209   LDLCALC 74 04/04/2015 1059   LDLCALC 152* 12/23/2014 1209   LDLDIRECT 131.5 11/06/2011 1057    Additional studies/ records that were reviewed today include:   PCI 06/01/15 2.75 x 16 mm Synergy DES to mid LAD 2.25 x 20 mm Synergy DES to distal LAD 2.25 x 20 mm Synergy DES to OM1 LCx prox 60% - treated medically  LHC 03/18/15 LAD prox 60%, mid 80%, distal  80-90% LCx prox 70+%, high OM proximal 90% RCA ostial  80%, prox 95%, mid 90%  Echo 6/16 Mild LVH, vigorous LV function, EF 65-70%, normal wall motion, moderate aortic stenosis (mean gradient 28 mmHg, peak 46 mmHg), mild AI  Carotid US 6/16 Bilateral ICA 1-49%  Myoview 6/16 Intermediate risk lexiscan nuclear stress test demonstrating a medium in size, moderate in intensity defect involving the apical lateral and mid-distal lateral-anterolateral wall (extent 22%) consistent with ischemia.The patient had baseline inferolateral T wave inversion and developed 1 mm downsloping ST depression inferiorly and had occasional to frequent PVC's during stress and in recovery. Global EF is preserved but there is mild distal lateral milf hypocontactility. Clinical correlation is recommended.   ASSESSMENT:     1. Coronary artery disease involving native coronary artery with other forms of angina pectoris (HCC)   2. Aortic stenosis   3. History of CVA (cerebrovascular accident)   4. PAD (peripheral artery disease) (HCC)   5. Essential hypertension   6. HYPERCHOLESTEROLEMIA     PLAN:     In order of problems listed above:  1. CAD - History of 3 vessel CAD status post multivessel PCI to the mid LAD, distal LAD and OM1 in 7/16. Now presents with exertional angina. He describes CCS Class 2-3 symptoms. Reviewed case with Dr. Marca Ancona by phone. Recommendation is to proceed with cardiac cath to better evaluate symptoms.  BP will not allow further titration of Amlodipine, nitrates, beta-blocker. Will add Ranexa 500 mg Twice daily. Risks and benefits of cardiac catheterization have been discussed with the patient.  These include bleeding, infection, kidney damage, stroke, heart attack, death.  The patient understands these risks and is willing to proceed.    -  LHC with Dr. Marca Ancona next week  -  Start Ranexa 500 mg bid  -  Continue Amlodipine, ASA, Plavix, nitrates, beta-blocker, statin.   2.  Aortic stenosis - Moderate AS by echo in 6/16 with mean gradient 28 mmHg.  Repeat echo prior to cath to make sure gradient has not increased since last echo.   3. History of CVA - Continue ASA, Plavix, statin.   4. PAD - Bilateral calf pain after walking about 100 yards. Extensive below-knee PAD. Medical management. Continue statin, Plavix, and ASA.   5. HTN - Controlled.  He does feel tired and has noted lower BPs.    -  Hold Micardis HCT x 3-4 days. If fatigue better and BP controlled, he can remain off of this med.   -  If BP increases > 130/85, he should resume Micardis HCT  6. HL - Continue statin. Good Lipids in 7/16.     Medication Adjustments/Labs and Tests Ordered: Current medicines are reviewed at length with the patient today.  Concerns regarding medicines are outlined above.  Medication changes, Labs and Tests ordered today are outlined in the Patient Instructions noted below. Patient Instructions  Medication Instructions:  1. HOLD MICARDIS/HCTZ FOR 3-4 DAYS  MONITOR BP DAILY AND RESUME MICARDIS/HCTZ IF BP IS ABOVE 130/85 OR INCREASED EDEMA OR IF FATIGUE IS NO BETTER  Labwork: TODAY BMET, CBC W/DIFF, PT/INR PRE CATH LABS  Testing/Procedures: 1. Your physician has requested that you have an echocardiogram. Echocardiography is a painless test that uses sound waves to create images of your heart. It provides your doctor with information about the size and shape of your heart and how well your heart's chambers and valves are working. This procedure takes approximately one hour. There are no restrictions for this procedure.  2. Your physician has requested that  you have a cardiac catheterization. Cardiac catheterization is used to diagnose and/or treat various heart conditions. Doctors may recommend this procedure for a number of different reasons. The most common reason is to evaluate chest pain. Chest pain can be a symptom of coronary artery disease (CAD), and cardiac  catheterization can show whether plaque is narrowing or blocking your heart's arteries. This procedure is also used to evaluate the valves, as well as measure the blood flow and oxygen levels in different parts of your heart. For further information please visit [email or web address] Please follow instruction sheet, as given.  Follow-Up: 12/01/15 @ 8:30 WITH SCOTT WEAVER, PAC POST CATH FOLLOW UP  Any Other Special Instructions Will Be Listed Below (If Applicable).  If you need a refill on your cardiac medications before your next appointment, please call your pharmacy.   Signed, Tereso Newcomer, PA-C  11/08/2015 5:40 PM    [facility] [address] [place], [place], [place]  [address] Phone: [phone]; Fax: [phone]

## 2015-11-07 NOTE — Telephone Encounter (Signed)
New Message   Pt wife calling to speak w/ Rn concerning what the pt discussed at recent PCP OV. Please call back and discuss.

## 2015-11-07 NOTE — Telephone Encounter (Signed)
The pt states that he saw his PCP on 2/24 and due to his BP of 106/48 he was advised to see his cardiologist soon. He is requesting an appt with Dr Shirlee Latch. The pt is advised that Dr Shirlee Latch does not have any openings until June. I offered to schedule an appt with an APP and he agreed. He has been scheduled to see Ronnie Newcomer, PA tomorrow 2/28 at 8:30. He verbalized understanding and thanked me for my assistance.

## 2015-11-08 ENCOUNTER — Telehealth: Payer: Self-pay | Admitting: Physician Assistant

## 2015-11-08 ENCOUNTER — Encounter: Payer: Self-pay | Admitting: *Deleted

## 2015-11-08 ENCOUNTER — Telehealth: Payer: Self-pay | Admitting: *Deleted

## 2015-11-08 ENCOUNTER — Ambulatory Visit (INDEPENDENT_AMBULATORY_CARE_PROVIDER_SITE_OTHER): Payer: Medicare Other | Admitting: Physician Assistant

## 2015-11-08 ENCOUNTER — Encounter: Payer: Self-pay | Admitting: Physician Assistant

## 2015-11-08 VITALS — BP 118/60 | HR 60 | Ht 64.0 in | Wt 182.4 lb

## 2015-11-08 DIAGNOSIS — E7800 Pure hypercholesterolemia, unspecified: Secondary | ICD-10-CM

## 2015-11-08 DIAGNOSIS — I10 Essential (primary) hypertension: Secondary | ICD-10-CM

## 2015-11-08 DIAGNOSIS — I25118 Atherosclerotic heart disease of native coronary artery with other forms of angina pectoris: Secondary | ICD-10-CM | POA: Diagnosis not present

## 2015-11-08 DIAGNOSIS — I739 Peripheral vascular disease, unspecified: Secondary | ICD-10-CM | POA: Diagnosis not present

## 2015-11-08 DIAGNOSIS — Z8673 Personal history of transient ischemic attack (TIA), and cerebral infarction without residual deficits: Secondary | ICD-10-CM

## 2015-11-08 DIAGNOSIS — I350 Nonrheumatic aortic (valve) stenosis: Secondary | ICD-10-CM

## 2015-11-08 LAB — BASIC METABOLIC PANEL
BUN: 15 mg/dL (ref 7–25)
CHLORIDE: 102 mmol/L (ref 98–110)
CO2: 29 mmol/L (ref 20–31)
CREATININE: 1.37 mg/dL — AB (ref 0.70–1.11)
Calcium: 9.1 mg/dL (ref 8.6–10.3)
Glucose, Bld: 86 mg/dL (ref 65–99)
POTASSIUM: 4.4 mmol/L (ref 3.5–5.3)
Sodium: 137 mmol/L (ref 135–146)

## 2015-11-08 LAB — CBC WITH DIFFERENTIAL/PLATELET
BASOS ABS: 0 10*3/uL (ref 0.0–0.1)
Basophils Relative: 1 % (ref 0–1)
Eosinophils Absolute: 0.3 10*3/uL (ref 0.0–0.7)
Eosinophils Relative: 6 % — ABNORMAL HIGH (ref 0–5)
HEMATOCRIT: 42.3 % (ref 39.0–52.0)
HEMOGLOBIN: 13.7 g/dL (ref 13.0–17.0)
LYMPHS ABS: 1.1 10*3/uL (ref 0.7–4.0)
LYMPHS PCT: 26 % (ref 12–46)
MCH: 25.5 pg — AB (ref 26.0–34.0)
MCHC: 32.4 g/dL (ref 30.0–36.0)
MCV: 78.6 fL (ref 78.0–100.0)
MONOS PCT: 21 % — AB (ref 3–12)
MPV: 10.6 fL (ref 8.6–12.4)
Monocytes Absolute: 0.9 10*3/uL (ref 0.1–1.0)
NEUTROS ABS: 2 10*3/uL (ref 1.7–7.7)
NEUTROS PCT: 46 % (ref 43–77)
Platelets: 163 10*3/uL (ref 150–400)
RBC: 5.38 MIL/uL (ref 4.22–5.81)
RDW: 15.3 % (ref 11.5–15.5)
WBC: 4.3 10*3/uL (ref 4.0–10.5)

## 2015-11-08 LAB — PROTIME-INR
INR: 1.22 (ref <1.50)
PROTHROMBIN TIME: 15.6 s — AB (ref 11.6–15.2)

## 2015-11-08 MED ORDER — RANOLAZINE ER 500 MG PO TB12: 500.0000 mg | ORAL_TABLET | Freq: Two times a day (BID) | ORAL | Status: DC

## 2015-11-08 NOTE — Telephone Encounter (Signed)
Pt said he saw Scott this morning. He was going to put him on Ranexa. He wants to know was it called in? Please call to [facility] on [address].

## 2015-11-08 NOTE — Patient Instructions (Addendum)
Medication Instructions:  1. HOLD MICARDIS/HCTZ FOR 3-4 DAYS  MONITOR BP DAILY AND RESUME MICARDIS/HCTZ IF BP IS ABOVE 130/85 OR INCREASED EDEMA OR IF FATIGUE IS NO BETTER  Labwork: TODAY BMET, CBC W/DIFF, PT/INR PRE CATH LABS  Testing/Procedures: 1. Your physician has requested that you have an echocardiogram. Echocardiography is a painless test that uses sound waves to create images of your heart. It provides your doctor with information about the size and shape of your heart and how well your heart's chambers and valves are working. This procedure takes approximately one hour. There are no restrictions for this procedure.  2. Your physician has requested that you have a cardiac catheterization. Cardiac catheterization is used to diagnose and/or treat various heart conditions. Doctors may recommend this procedure for a number of different reasons. The most common reason is to evaluate chest pain. Chest pain can be a symptom of coronary artery disease (CAD), and cardiac catheterization can show whether plaque is narrowing or blocking your heart's arteries. This procedure is also used to evaluate the valves, as well as measure the blood flow and oxygen levels in different parts of your heart. For further information please visit [email or web address] Please follow instruction sheet, as given.  Follow-Up: 12/01/15 @ 8:30 WITH SCOTT WEAVER, PAC POST CATH FOLLOW UP  Any Other Special Instructions Will Be Listed Below (If Applicable).  If you need a refill on your cardiac medications before your next appointment, please call your pharmacy.

## 2015-11-08 NOTE — Telephone Encounter (Signed)
I called pt and advised that Ranexa was sent in earlier today. I also advised pt after he left from today's appt the pharm rep for Ranexa came in and brought samples. Pt advised samples placed at front desk for pick up. Pt said thank you.

## 2015-11-08 NOTE — Telephone Encounter (Signed)
Pt notified of lab results ok to proceed w/cath on 3/8 w/Dr. Shirlee Latch. Pt wanted to know will Dr. Shirlee Latch be s/w him 1-2 days before cath. I advised pt usually dr. will s/w pt the day of procedure before procedure is started. Asked what are they looking for. I advised pt that Dr. Shirlee Latch will be looking to see if previous stent that was placed 05/2015 has occluded and if not then is there another blockage. Pt really seems anxious about really wanting to s/w Dr. Shirlee Latch before cath. I answered all of pt's questions for him.

## 2015-11-09 ENCOUNTER — Other Ambulatory Visit (INDEPENDENT_AMBULATORY_CARE_PROVIDER_SITE_OTHER): Payer: Medicare Other

## 2015-11-09 ENCOUNTER — Encounter (HOSPITAL_COMMUNITY): Payer: Medicare Other

## 2015-11-09 ENCOUNTER — Encounter: Payer: Self-pay | Admitting: Internal Medicine

## 2015-11-09 DIAGNOSIS — E119 Type 2 diabetes mellitus without complications: Secondary | ICD-10-CM | POA: Insufficient documentation

## 2015-11-09 DIAGNOSIS — I739 Peripheral vascular disease, unspecified: Secondary | ICD-10-CM

## 2015-11-09 DIAGNOSIS — I25118 Atherosclerotic heart disease of native coronary artery with other forms of angina pectoris: Secondary | ICD-10-CM | POA: Diagnosis not present

## 2015-11-09 DIAGNOSIS — I10 Essential (primary) hypertension: Secondary | ICD-10-CM | POA: Diagnosis not present

## 2015-11-09 DIAGNOSIS — R739 Hyperglycemia, unspecified: Secondary | ICD-10-CM

## 2015-11-09 DIAGNOSIS — K219 Gastro-esophageal reflux disease without esophagitis: Secondary | ICD-10-CM

## 2015-11-09 DIAGNOSIS — E7800 Pure hypercholesterolemia, unspecified: Secondary | ICD-10-CM

## 2015-11-09 LAB — COMPREHENSIVE METABOLIC PANEL
ALK PHOS: 43 U/L (ref 39–117)
ALT: 14 U/L (ref 0–53)
AST: 19 U/L (ref 0–37)
Albumin: 4.2 g/dL (ref 3.5–5.2)
BILIRUBIN TOTAL: 0.6 mg/dL (ref 0.2–1.2)
BUN: 15 mg/dL (ref 6–23)
CALCIUM: 9.4 mg/dL (ref 8.4–10.5)
CO2: 28 meq/L (ref 19–32)
CREATININE: 1.23 mg/dL (ref 0.40–1.50)
Chloride: 103 mEq/L (ref 96–112)
GFR: 71.71 mL/min (ref >60.00)
Glucose, Bld: 101 mg/dL — ABNORMAL HIGH (ref 70–99)
Potassium: 4.1 mEq/L (ref 3.5–5.1)
Sodium: 137 mEq/L (ref 135–145)
TOTAL PROTEIN: 7.6 g/dL (ref 6.0–8.3)

## 2015-11-09 LAB — LIPID PANEL
CHOLESTEROL: 180 mg/dL (ref 0–200)
HDL: 85 mg/dL (ref >39.00)
LDL Cholesterol: 88 mg/dL (ref 0–99)
NonHDL: 94.91
TRIGLYCERIDES: 37 mg/dL (ref 0.0–149.0)
Total CHOL/HDL Ratio: 2
VLDL: 7.4 mg/dL (ref 0.0–40.0)

## 2015-11-09 LAB — HEMOGLOBIN A1C: HEMOGLOBIN A1C: 6.8 % — AB (ref 4.6–6.5)

## 2015-11-10 ENCOUNTER — Encounter: Payer: Self-pay | Admitting: Emergency Medicine

## 2015-11-11 ENCOUNTER — Encounter (HOSPITAL_COMMUNITY): Payer: Self-pay

## 2015-11-11 ENCOUNTER — Encounter (HOSPITAL_COMMUNITY): Payer: Medicare Other

## 2015-11-11 ENCOUNTER — Other Ambulatory Visit (HOSPITAL_COMMUNITY): Payer: Self-pay

## 2015-11-11 ENCOUNTER — Emergency Department (HOSPITAL_COMMUNITY): Payer: Medicare Other

## 2015-11-11 ENCOUNTER — Encounter (HOSPITAL_COMMUNITY)
Admission: EM | Disposition: A | Discharge status: Home / Self Care | Payer: Self-pay | Source: Home / Self Care | Attending: Emergency Medicine

## 2015-11-11 ENCOUNTER — Observation Stay (HOSPITAL_COMMUNITY)
Admission: EM | Admit: 2015-11-11 | Discharge: 2015-11-12 | Disposition: A | Discharge status: Home / Self Care | Payer: Medicare Other | Attending: Interventional Cardiology | Admitting: Interventional Cardiology

## 2015-11-11 ENCOUNTER — Ambulatory Visit (HOSPITAL_COMMUNITY): Admission: RE | Admit: 2015-11-11 | Payer: Medicare Other | Source: Ambulatory Visit | Admitting: Cardiology

## 2015-11-11 ENCOUNTER — Other Ambulatory Visit (HOSPITAL_COMMUNITY): Payer: Medicare Other

## 2015-11-11 ENCOUNTER — Other Ambulatory Visit: Payer: Self-pay

## 2015-11-11 DIAGNOSIS — Z9989 Dependence on other enabling machines and devices: Secondary | ICD-10-CM

## 2015-11-11 DIAGNOSIS — Z8673 Personal history of transient ischemic attack (TIA), and cerebral infarction without residual deficits: Secondary | ICD-10-CM | POA: Insufficient documentation

## 2015-11-11 DIAGNOSIS — I350 Nonrheumatic aortic (valve) stenosis: Secondary | ICD-10-CM

## 2015-11-11 DIAGNOSIS — I129 Hypertensive chronic kidney disease with stage 1 through stage 4 chronic kidney disease, or unspecified chronic kidney disease: Secondary | ICD-10-CM | POA: Insufficient documentation

## 2015-11-11 DIAGNOSIS — R079 Chest pain, unspecified: Secondary | ICD-10-CM

## 2015-11-11 DIAGNOSIS — M19011 Primary osteoarthritis, right shoulder: Secondary | ICD-10-CM | POA: Insufficient documentation

## 2015-11-11 DIAGNOSIS — I2510 Atherosclerotic heart disease of native coronary artery without angina pectoris: Secondary | ICD-10-CM | POA: Insufficient documentation

## 2015-11-11 DIAGNOSIS — E7800 Pure hypercholesterolemia, unspecified: Secondary | ICD-10-CM | POA: Diagnosis not present

## 2015-11-11 DIAGNOSIS — I639 Cerebral infarction, unspecified: Secondary | ICD-10-CM

## 2015-11-11 DIAGNOSIS — I10 Essential (primary) hypertension: Secondary | ICD-10-CM | POA: Diagnosis present

## 2015-11-11 DIAGNOSIS — Z7902 Long term (current) use of antithrombotics/antiplatelets: Secondary | ICD-10-CM | POA: Diagnosis not present

## 2015-11-11 DIAGNOSIS — N189 Chronic kidney disease, unspecified: Secondary | ICD-10-CM | POA: Insufficient documentation

## 2015-11-11 DIAGNOSIS — E875 Hyperkalemia: Secondary | ICD-10-CM | POA: Insufficient documentation

## 2015-11-11 DIAGNOSIS — Z7982 Long term (current) use of aspirin: Secondary | ICD-10-CM | POA: Diagnosis not present

## 2015-11-11 DIAGNOSIS — E1122 Type 2 diabetes mellitus with diabetic chronic kidney disease: Secondary | ICD-10-CM | POA: Diagnosis not present

## 2015-11-11 DIAGNOSIS — G4733 Obstructive sleep apnea (adult) (pediatric): Secondary | ICD-10-CM | POA: Diagnosis not present

## 2015-11-11 DIAGNOSIS — E1159 Type 2 diabetes mellitus with other circulatory complications: Secondary | ICD-10-CM

## 2015-11-11 DIAGNOSIS — E119 Type 2 diabetes mellitus without complications: Secondary | ICD-10-CM

## 2015-11-11 DIAGNOSIS — Z955 Presence of coronary angioplasty implant and graft: Secondary | ICD-10-CM | POA: Insufficient documentation

## 2015-11-11 DIAGNOSIS — I739 Peripheral vascular disease, unspecified: Secondary | ICD-10-CM | POA: Diagnosis present

## 2015-11-11 DIAGNOSIS — I200 Unstable angina: Secondary | ICD-10-CM | POA: Diagnosis present

## 2015-11-11 DIAGNOSIS — I25118 Atherosclerotic heart disease of native coronary artery with other forms of angina pectoris: Secondary | ICD-10-CM | POA: Diagnosis not present

## 2015-11-11 DIAGNOSIS — E1151 Type 2 diabetes mellitus with diabetic peripheral angiopathy without gangrene: Secondary | ICD-10-CM | POA: Diagnosis not present

## 2015-11-11 DIAGNOSIS — K219 Gastro-esophageal reflux disease without esophagitis: Secondary | ICD-10-CM | POA: Diagnosis present

## 2015-11-11 DIAGNOSIS — Z79899 Other long term (current) drug therapy: Secondary | ICD-10-CM | POA: Diagnosis not present

## 2015-11-11 DIAGNOSIS — I25110 Atherosclerotic heart disease of native coronary artery with unstable angina pectoris: Principal | ICD-10-CM | POA: Insufficient documentation

## 2015-11-11 HISTORY — PX: CARDIAC CATHETERIZATION: SHX172

## 2015-11-11 HISTORY — DX: Atherosclerotic heart disease of native coronary artery without angina pectoris: I25.10

## 2015-11-11 LAB — CBC WITH DIFFERENTIAL/PLATELET
Basophils Absolute: 0 10*3/uL (ref 0.0–0.1)
Basophils Relative: 1 %
Eosinophils Absolute: 0.2 10*3/uL (ref 0.0–0.7)
Eosinophils Relative: 6 %
HEMATOCRIT: 42 % (ref 39.0–52.0)
HEMOGLOBIN: 13.1 g/dL (ref 13.0–17.0)
LYMPHS ABS: 0.9 10*3/uL (ref 0.7–4.0)
LYMPHS PCT: 23 %
MCH: 25.1 pg — AB (ref 26.0–34.0)
MCHC: 31.2 g/dL (ref 30.0–36.0)
MCV: 80.6 fL (ref 78.0–100.0)
MONOS PCT: 22 %
Monocytes Absolute: 0.8 10*3/uL (ref 0.1–1.0)
NEUTROS ABS: 1.7 10*3/uL (ref 1.7–7.7)
NEUTROS PCT: 48 %
Platelets: 146 10*3/uL — ABNORMAL LOW (ref 150–400)
RBC: 5.21 MIL/uL (ref 4.22–5.81)
RDW: 16 % — ABNORMAL HIGH (ref 11.5–15.5)
WBC: 3.7 10*3/uL — ABNORMAL LOW (ref 4.0–10.5)

## 2015-11-11 LAB — BASIC METABOLIC PANEL
ANION GAP: 12 (ref 5–15)
BUN: 15 mg/dL (ref 6–20)
CHLORIDE: 106 mmol/L (ref 101–111)
CO2: 22 mmol/L (ref 22–32)
CREATININE: 1.41 mg/dL — AB (ref 0.61–1.24)
Calcium: 9.2 mg/dL (ref 8.9–10.3)
GFR calc non Af Amer: 44 mL/min — ABNORMAL LOW (ref >60)
GFR, EST AFRICAN AMERICAN: 50 mL/min — AB (ref >60)
Glucose, Bld: 102 mg/dL — ABNORMAL HIGH (ref 65–99)
Potassium: 4.2 mmol/L (ref 3.5–5.1)
Sodium: 140 mmol/L (ref 135–145)

## 2015-11-11 LAB — TROPONIN I: Troponin I: <0.03 ng/mL (ref <0.031)

## 2015-11-11 LAB — CREATININE, SERUM
Creatinine, Ser: 1.17 mg/dL (ref 0.61–1.24)
GFR calc Af Amer: >60 mL/min (ref >60)
GFR, EST NON AFRICAN AMERICAN: 55 mL/min — AB (ref >60)

## 2015-11-11 LAB — CBC
HEMATOCRIT: 45.9 % (ref 39.0–52.0)
HEMOGLOBIN: 14.9 g/dL (ref 13.0–17.0)
MCH: 26.2 pg (ref 26.0–34.0)
MCHC: 32.5 g/dL (ref 30.0–36.0)
MCV: 80.8 fL (ref 78.0–100.0)
Platelets: 143 10*3/uL — ABNORMAL LOW (ref 150–400)
RBC: 5.68 MIL/uL (ref 4.22–5.81)
RDW: 15.8 % — AB (ref 11.5–15.5)
WBC: 5.5 10*3/uL (ref 4.0–10.5)

## 2015-11-11 LAB — POCT ACTIVATED CLOTTING TIME: ACTIVATED CLOTTING TIME: 307 s

## 2015-11-11 LAB — I-STAT TROPONIN, ED: Troponin i, poc: 0 ng/mL (ref 0.00–0.08)

## 2015-11-11 SURGERY — INTRAVASCULAR PRESSURE WIRE/FFR STUDY

## 2015-11-11 MED ORDER — SODIUM CHLORIDE 0.9% FLUSH: 3.0000 mL | INTRAVENOUS | Status: DC | PRN

## 2015-11-11 MED ORDER — SODIUM CHLORIDE 0.9 % WEIGHT BASED INFUSION: 3.0000 mL/kg/h | INTRAVENOUS | Status: DC

## 2015-11-11 MED ORDER — SODIUM CHLORIDE 0.9 % IV SOLN
INTRAVENOUS | Status: DC | PRN
  Administered 2015-11-11: 1 mL/kg/h via INTRAVENOUS

## 2015-11-11 MED ORDER — ADENOSINE (DIAGNOSTIC) 140MCG/KG/MIN
INTRAVENOUS | Status: DC | PRN
  Administered 2015-11-11: 140 ug/kg/min via INTRAVENOUS

## 2015-11-11 MED ORDER — TAMSULOSIN HCL 0.4 MG PO CAPS
0.4000 mg | ORAL_CAPSULE | Freq: Every day | ORAL | Status: DC
  Administered 2015-11-11 – 2015-11-12 (×2): 0.4 mg via ORAL
  Filled 2015-11-11 (×2): qty 1

## 2015-11-11 MED ORDER — HEPARIN (PORCINE) IN NACL 2-0.9 UNIT/ML-% IJ SOLN
INTRAMUSCULAR | Status: AC
  Filled 2015-11-11: qty 1500

## 2015-11-11 MED ORDER — SODIUM CHLORIDE 0.9 % WEIGHT BASED INFUSION: 1.0000 mL/kg/h | INTRAVENOUS | Status: DC

## 2015-11-11 MED ORDER — IOHEXOL 350 MG/ML SOLN
INTRAVENOUS | Status: DC | PRN
  Administered 2015-11-11: 50 mL via INTRACARDIAC

## 2015-11-11 MED ORDER — SODIUM CHLORIDE 0.9 % IV SOLN: INTRAVENOUS | Status: DC

## 2015-11-11 MED ORDER — ONDANSETRON HCL 4 MG/2ML IJ SOLN: 4.0000 mg | Freq: Four times a day (QID) | INTRAMUSCULAR | Status: DC | PRN

## 2015-11-11 MED ORDER — HEPARIN SODIUM (PORCINE) 1000 UNIT/ML IJ SOLN
INTRAMUSCULAR | Status: DC | PRN
  Administered 2015-11-11: 5000 [IU] via INTRAVENOUS
  Administered 2015-11-11: 4000 [IU] via INTRAVENOUS

## 2015-11-11 MED ORDER — CLOPIDOGREL BISULFATE 75 MG PO TABS
75.0000 mg | ORAL_TABLET | Freq: Every day | ORAL | Status: DC
  Administered 2015-11-11 – 2015-11-12 (×2): 75 mg via ORAL
  Filled 2015-11-11 (×2): qty 1

## 2015-11-11 MED ORDER — SODIUM CHLORIDE 0.9% FLUSH: 3.0000 mL | Freq: Two times a day (BID) | INTRAVENOUS | Status: DC

## 2015-11-11 MED ORDER — LIDOCAINE HCL (PF) 1 % IJ SOLN
INTRAMUSCULAR | Status: AC
  Filled 2015-11-11: qty 30

## 2015-11-11 MED ORDER — RANOLAZINE ER 500 MG PO TB12
500.0000 mg | ORAL_TABLET | Freq: Two times a day (BID) | ORAL | Status: DC
  Administered 2015-11-11 – 2015-11-12 (×2): 500 mg via ORAL
  Filled 2015-11-11 (×2): qty 1

## 2015-11-11 MED ORDER — LIDOCAINE HCL (PF) 1 % IJ SOLN
INTRAMUSCULAR | Status: DC | PRN
  Administered 2015-11-11: 8 mL via SUBCUTANEOUS

## 2015-11-11 MED ORDER — NITROGLYCERIN 0.4 MG SL SUBL: 0.4000 mg | SUBLINGUAL_TABLET | SUBLINGUAL | Status: DC | PRN

## 2015-11-11 MED ORDER — POTASSIUM CHLORIDE CRYS ER 20 MEQ PO TBCR
20.0000 meq | EXTENDED_RELEASE_TABLET | Freq: Every day | ORAL | Status: DC
  Administered 2015-11-11: 20:00:00 20 meq via ORAL
  Filled 2015-11-11: qty 1

## 2015-11-11 MED ORDER — HEPARIN (PORCINE) IN NACL 2-0.9 UNIT/ML-% IJ SOLN
INTRAMUSCULAR | Status: DC | PRN
  Administered 2015-11-11: 15:00:00 via INTRA_ARTERIAL

## 2015-11-11 MED ORDER — PRAVASTATIN SODIUM 40 MG PO TABS
40.0000 mg | ORAL_TABLET | Freq: Every day | ORAL | Status: DC
  Administered 2015-11-11: 40 mg via ORAL
  Filled 2015-11-11: qty 1

## 2015-11-11 MED ORDER — OMEGA-3-ACID ETHYL ESTERS 1 G PO CAPS
1.0000 g | ORAL_CAPSULE | Freq: Every day | ORAL | Status: DC
  Administered 2015-11-12: 1 g via ORAL
  Filled 2015-11-11: qty 1

## 2015-11-11 MED ORDER — ADULT MULTIVITAMIN W/MINERALS CH
1.0000 | ORAL_TABLET | Freq: Every day | ORAL | Status: DC
  Administered 2015-11-12: 09:00:00 1 via ORAL
  Filled 2015-11-11 (×2): qty 1

## 2015-11-11 MED ORDER — NITROGLYCERIN 1 MG/10 ML FOR IR/CATH LAB
INTRA_ARTERIAL | Status: DC | PRN
  Administered 2015-11-11: 150 ug via INTRACORONARY

## 2015-11-11 MED ORDER — ACETAMINOPHEN 325 MG PO TABS: 650.0000 mg | ORAL_TABLET | ORAL | Status: DC | PRN

## 2015-11-11 MED ORDER — AMLODIPINE BESYLATE 10 MG PO TABS
10.0000 mg | ORAL_TABLET | Freq: Every day | ORAL | Status: DC
  Administered 2015-11-11 – 2015-11-12 (×2): 10 mg via ORAL
  Filled 2015-11-11: qty 1
  Filled 2015-11-11: qty 2

## 2015-11-11 MED ORDER — ISOSORBIDE MONONITRATE ER 60 MG PO TB24
60.0000 mg | ORAL_TABLET | Freq: Every day | ORAL | Status: DC
  Administered 2015-11-11 – 2015-11-12 (×2): 60 mg via ORAL
  Filled 2015-11-11 (×2): qty 1

## 2015-11-11 MED ORDER — FENTANYL CITRATE (PF) 100 MCG/2ML IJ SOLN
INTRAMUSCULAR | Status: DC | PRN
  Administered 2015-11-11 (×2): 25 ug via INTRAVENOUS

## 2015-11-11 MED ORDER — COENZYME Q10 200 MG PO CAPS: 200.0000 mg | ORAL_CAPSULE | Freq: Every day | ORAL | Status: DC

## 2015-11-11 MED ORDER — HEPARIN (PORCINE) IN NACL 2-0.9 UNIT/ML-% IJ SOLN
INTRAMUSCULAR | Status: DC | PRN
  Administered 2015-11-11: 16:00:00

## 2015-11-11 MED ORDER — LORATADINE 10 MG PO TABS: 10.0000 mg | ORAL_TABLET | Freq: Every day | ORAL | Status: DC | PRN

## 2015-11-11 MED ORDER — PANTOPRAZOLE SODIUM 40 MG PO TBEC
40.0000 mg | DELAYED_RELEASE_TABLET | Freq: Every day | ORAL | Status: DC
  Administered 2015-11-11 – 2015-11-12 (×2): 40 mg via ORAL
  Filled 2015-11-11 (×2): qty 1

## 2015-11-11 MED ORDER — ASPIRIN 81 MG PO CHEW
CHEWABLE_TABLET | ORAL | Status: AC
  Filled 2015-11-11: qty 1

## 2015-11-11 MED ORDER — ADENOSINE 12 MG/4ML IV SOLN
16.0000 mL | Freq: Once | INTRAVENOUS | Status: DC
  Filled 2015-11-11: qty 16

## 2015-11-11 MED ORDER — HEPARIN SODIUM (PORCINE) 5000 UNIT/ML IJ SOLN
5000.0000 [IU] | Freq: Three times a day (TID) | INTRAMUSCULAR | Status: DC
  Administered 2015-11-11 – 2015-11-12 (×2): 5000 [IU] via SUBCUTANEOUS
  Filled 2015-11-11 (×2): qty 1

## 2015-11-11 MED ORDER — ALUM & MAG HYDROXIDE-SIMETH 200-200-20 MG/5ML PO SUSP: 15.0000 mL | Freq: Four times a day (QID) | ORAL | Status: DC | PRN

## 2015-11-11 MED ORDER — ASPIRIN 81 MG PO CHEW: 81.0000 mg | CHEWABLE_TABLET | ORAL | Status: DC

## 2015-11-11 MED ORDER — MIDAZOLAM HCL 2 MG/2ML IJ SOLN
INTRAMUSCULAR | Status: DC | PRN
  Administered 2015-11-11 (×2): 1 mg via INTRAVENOUS

## 2015-11-11 MED ORDER — FENTANYL CITRATE (PF) 100 MCG/2ML IJ SOLN
INTRAMUSCULAR | Status: AC
  Filled 2015-11-11: qty 2

## 2015-11-11 MED ORDER — HEPARIN SODIUM (PORCINE) 1000 UNIT/ML IJ SOLN
INTRAMUSCULAR | Status: AC
  Filled 2015-11-11: qty 1

## 2015-11-11 MED ORDER — ASPIRIN EC 81 MG PO TBEC
81.0000 mg | DELAYED_RELEASE_TABLET | Freq: Every day | ORAL | Status: DC
  Administered 2015-11-11 – 2015-11-12 (×2): 81 mg via ORAL
  Filled 2015-11-11 (×2): qty 1

## 2015-11-11 MED ORDER — AZELASTINE-FLUTICASONE 137-50 MCG/ACT NA SUSP: 1.0000 | Freq: Every day | NASAL | Status: DC | PRN

## 2015-11-11 MED ORDER — MIDAZOLAM HCL 2 MG/2ML IJ SOLN
INTRAMUSCULAR | Status: AC
  Filled 2015-11-11: qty 2

## 2015-11-11 MED ORDER — NITROGLYCERIN 1 MG/10 ML FOR IR/CATH LAB
INTRA_ARTERIAL | Status: AC
  Filled 2015-11-11: qty 10

## 2015-11-11 MED ORDER — ASPIRIN 81 MG PO CHEW
CHEWABLE_TABLET | ORAL | Status: DC | PRN
  Administered 2015-11-11: 81 mg via ORAL

## 2015-11-11 MED ORDER — SODIUM CHLORIDE 0.9 % IV SOLN: 250.0000 mL | INTRAVENOUS | Status: DC | PRN

## 2015-11-11 MED ORDER — OMEGA-3-ACID ETHYL ESTERS 1 G PO CAPS: 1.0000 g | ORAL_CAPSULE | Freq: Every day | ORAL | Status: DC

## 2015-11-11 MED ORDER — SODIUM CHLORIDE 0.9 % IV SOLN
INTRAVENOUS | Status: AC
  Administered 2015-11-11: 17:00:00 via INTRAVENOUS

## 2015-11-11 MED ORDER — METOPROLOL TARTRATE 25 MG PO TABS
25.0000 mg | ORAL_TABLET | Freq: Two times a day (BID) | ORAL | Status: DC
  Administered 2015-11-11 – 2015-11-12 (×3): 25 mg via ORAL
  Filled 2015-11-11 (×3): qty 1

## 2015-11-11 SURGICAL SUPPLY — 14 items
CATH INFINITI 5 FR JL3.5 (CATHETERS) ×2 IMPLANT
CATH INFINITI JR4 5F (CATHETERS) ×2 IMPLANT
CATH MICROCATH NAVVUS (MICROCATHETER) IMPLANT
CATH VISTA GUIDE 6FR XBLAD3.5 (CATHETERS) ×2 IMPLANT
DEVICE RAD COMP TR BAND LRG (VASCULAR PRODUCTS) ×4 IMPLANT
GLIDESHEATH SLEND SS 6F .021 (SHEATH) ×2 IMPLANT
KIT ENCORE 26 ADVANTAGE (KITS) ×2 IMPLANT
KIT HEART LEFT (KITS) ×4 IMPLANT
MICROCATHETER NAVVUS (MICROCATHETER) ×4
PACK CARDIAC CATHETERIZATION (CUSTOM PROCEDURE TRAY) ×4 IMPLANT
TRANSDUCER W/STOPCOCK (MISCELLANEOUS) ×4 IMPLANT
TUBING CIL FLEX 10 FLL-RA (TUBING) ×4 IMPLANT
WIRE COUGAR XT STRL 190CM (WIRE) ×2 IMPLANT
WIRE SAFE-T 1.5MM-J .035X260CM (WIRE) ×2 IMPLANT

## 2015-11-11 NOTE — ED Notes (Addendum)
Consulting PA at bedside. 

## 2015-11-11 NOTE — ED Notes (Signed)
Pt arrived via [facility] from home c/o right shoulder pain radiating to right jaw around 0330 denies any chest pain.  Pt had stents placed in September, pt took 2 nitro at home around [id number].  Pt had runs of bijim PVC's.

## 2015-11-11 NOTE — Interval H&P Note (Signed)
Cath Lab Visit (complete for each Cath Lab visit)  Clinical Evaluation Leading to the Procedure:   ACS: Yes.    Non-ACS:    Anginal Classification: CCS IV  Anti-ischemic medical therapy: Maximal Therapy (2 or more classes of medications)  Non-Invasive Test Results: No non-invasive testing performed  Prior CABG: No previous CABG      History and Physical Interval Note:  11/11/2015 3:15 PM  Margie BilletWilliam G Sammon  has presented today for surgery, with the diagnosis of cp/cad  The various methods of treatment have been discussed with the patient and family. After consideration of risks, benefits and other options for treatment, the patient has consented to  Procedure(s): Left Heart Cath and Coronary Angiography ([name]) as a surgical intervention .  The patient's history has been reviewed, patient examined, no change in status, stable for surgery.  I have reviewed the patient's chart and labs.  Questions were answered to the patient's satisfaction.     Tonny Bollmanooper, Violett Hobbs

## 2015-11-11 NOTE — H&P (Addendum)
Patient ID: Ronnie BilletWilliam G Delage MRN: [id number], DOB/AGE: [birth year]   Admit date: 11/11/2015   Primary Physician: Pincus SanesStacy J Burns, MD Primary Cardiologist: Dr. Shirlee LatchMcLean  Pt. Profile:  Ronnie BilletWilliam G Reposa is a 82 y.o. male with a history HTN, diet-controlled diabetes, hyperlipidemia, moderate aortic stenosis, CVA, CAD, and PAD who presented to [facility] ED for evaluation of chest pain.   HPI: Patient was admitted in 6/16 with small right medial thalamic/mid brain lacunar infarct manifesting with balance difficulty and slight drooping of the right eyelid. Strokes signs completely resolved with no residual defect. He had a carotid US in 6/16 without significant stenosis. At that time, the patient also noted tightness in his chest with moderate exertion (yardwork, carrying a load). Echo showed normal EF and moderate AS in 6/16. He had an abnormal Cardiolite which was followed by [facility] in 7/16 showing significant 3 vessel disease. He was started on Imdur and metoprolol was increased. He was referred to cardiac surgery for evaluation as CABG was thought to be most likely to provide complete revascularization in this situation. He saw Dr Dorris FetchHendrickson. After discussion with neurology, it was decided that as long as he remains symptomatically stable, he should not have CABG until 3 months after CVA. Ultimately, it was elected to treat him percutaneously. He had Synergy DES biodegradable stents to the mLAD, dLAD, and OM1.   Seen by Dr. Shirlee LatchMcLean 10/13/15. He reported doing moderate exercise without angina. He did report bilateral claudication which was stable (walking about 100 yards, L>R) => he had peripheral arterial dopplers with below-the-knee disease, plan to manage medically.   Last seen by Tereso NewcomerScott Weaver, PA 2/27 with complains of exertional angina (CCS class 2-3 symptoms). Case was discuss with primary cardiologist and he was for cardiac catheterization 3/8 to better evaluate symptoms. He was also scheduled to  have a echocardiogram done today at 10:30 AM for evaluation of aortic stenosis. Added Ranexa 500 mg Twice daily.  Today, he woke of with a right-sided shoulder pain around 3:30 AM. The pain was radiated to his right elbow and was associated with diaphoresis. He took sublingual nitroglycerin x 2 with minimal relief. By the time EMS arrived he was chest pain-free. This pain is different from prior cardiac pain. Admits to having exertional shortness of breath.   EKG shows normal sinus rhythm with prolonged PR interval. No acute ST or T wave changes. Point-of-care troponin negative. Troponin I negative. LDL 88. Creatinine 1.41. Hemoglobin A1c 6.8. Chest x-ray shows borderline cardiomegaly.   The patient denies nausea, vomiting, fever,palpitations,  orthopnea, PND, dizziness, syncope, cough, congestion, abdominal pain, hematochezia, melena, lower extremity edema.   Problem List  Past Medical History  Diagnosis Date  . Allergic rhinitis   . Hypertension   . PVD (peripheral vascular disease) (HCC)   . Hypercholesteremia   . GERD (gastroesophageal reflux disease)   . Hemorrhoids   . Prostatitis     hx of  . Aortic stenosis     mild echo 7/14  . Acid reflux   . Family history of adverse reaction to anesthesia     "daughter had problems when she was a teen; none since; it was related to asthma"  . Heart murmur   . OSA on CPAP 05/23/2015    "just started wearing mas" (06/01/2015)  . Borderline type 2 diabetes mellitus   . History of hiatal hernia     "dx'd years ago" (06/01/2015)  . Stroke (HCC) 02/09/2015    denies residual on  06/01/2015  . Degenerative joint disease   . Arthritis     "right shoulder" (06/01/2015)    Past Surgical History  Procedure Laterality Date  . Carpal tunnel release Bilateral 2006-2007  . Excisional hemorrhoidectomy  1989  . Cardiac catheterization [name] 03/18/2015    Procedure: Left Heart Cath and Coronary Angiography;  Surgeon: Laurey Morale, MD;  Location: [facility]  INVASIVE CV LAB;  Service: Cardiovascular;  Laterality: [name];  . Cardiac catheterization [name] 06/01/2015    Procedure: Coronary Stent Intervention;  Surgeon: Iran Ouch, MD;  Location: [facility] INVASIVE CV LAB;  Service: Cardiovascular;  Laterality: [name];  . Cataract extraction w/ intraocular lens  implant, bilateral Bilateral ~ 2003     Allergies  No Known Allergies   Home Medications  Prior to Admission medications   Medication Sig Start Date End Date Taking? Authorizing Provider  alum & mag hydroxide-simeth (MAALOX/MYLANTA) 200-200-20 MG/5ML suspension Take 15 mLs by mouth every 6 (six) hours as needed for indigestion or heartburn.   Yes Historical Provider, MD  amLODipine (NORVASC) 10 MG tablet Take 1 tablet (10 mg total) by mouth daily. 12/23/14  Yes Newt Lukes, MD  aspirin EC 81 MG tablet Take 81 mg by mouth daily.   Yes Historical Provider, MD  Azelastine-Fluticasone (DYMISTA) 137-50 MCG/ACT SUSP Place 1 spray into the nose daily as needed (allergies).   Yes Historical Provider, MD  clopidogrel (PLAVIX) 75 MG tablet Take 1 tablet (75 mg total) by mouth daily. 03/17/15  Yes Micki Riley, MD  Coenzyme Q10 200 MG capsule Take 200 mg by mouth daily.  05/04/11  Yes Laurey Morale, MD  Garlic Oil 1000 MG CAPS Take 1 capsule by mouth daily.     Yes Historical Provider, MD  isosorbide mononitrate (IMDUR) 60 MG 24 hr tablet Take 1 tablet (60 mg total) by mouth daily. 09/06/15  Yes Laurey Morale, MD  loratadine (CLARITIN) 10 MG tablet Take 10 mg by mouth daily as needed for rhinitis.    Yes Historical Provider, MD  metoprolol tartrate (LOPRESSOR) 25 MG tablet Take 25 mg by mouth 2 (two) times daily.   Yes Historical Provider, MD  Multiple Vitamins-Minerals (CENTRUM SILVER) tablet Take 1 tablet by mouth daily.     Yes Historical Provider, MD  nitroGLYCERIN (NITROSTAT) 0.4 MG SL tablet Place 1 tablet (0.4 mg total) under the tongue every 5 (five) minutes as needed for chest pain. 06/02/15   Yes Azalee Course, PA  Omega-3 Fatty Acids (FISH OIL) 1000 MG CAPS Take 1 capsule by mouth daily.     Yes Historical Provider, MD  pantoprazole (PROTONIX) 40 MG tablet Take 1 tablet (40 mg total) by mouth daily. 10/18/15  Yes Laurey Morale, MD  potassium chloride SA (KLOR-CON M20) 20 MEQ tablet Take 1 tablet (20 mEq total) by mouth daily. 12/23/14  Yes Newt Lukes, MD  pravastatin (PRAVACHOL) 40 MG tablet TAKE 1 TABLET (40 MG TOTAL) BY MOUTH EVERY EVENING. 10/17/15  Yes Laurey Morale, MD  ranolazine (RANEXA) 500 MG 12 hr tablet Take 1 tablet (500 mg total) by mouth 2 (two) times daily. 11/08/15  Yes Beatrice Lecher, PA-C  tamsulosin (FLOMAX) 0.4 MG CAPS capsule Take 0.4 mg by mouth daily.   Yes Historical Provider, MD    Family History  Family History  Problem Relation Age of Onset  . Arthritis    . Hypertension Mother   . Diabetes Mother 38  . Stroke Father 62  . Diabetes  Brother    Family Status  Relation Status Death Age  . Mother Deceased 40  . Father Deceased 71  . Sister Deceased 68  . Brother Deceased 5    farm accident   . Daughter Alive   . Son Alive   . Daughter Alive   . Brother Deceased 32  . Maternal Grandmother Deceased   . Maternal Grandfather Deceased   . Paternal Grandmother Deceased   . Paternal Grandfather Deceased       Social History  Social History   Social History  . Marital Status: Married    Spouse Name: [name]  . Number of Children: [name]  . Years of Education: [name]   Occupational History  . retired    Social History Main Topics  . Smoking status: Never Smoker   . Smokeless tobacco: Never Used  . Alcohol Use: No  . Drug Use: No  . Sexual Activity: Not Currently   Other Topics Concern  . Not on file   Social History Narrative      All other systems reviewed and are otherwise negative except as noted above.  Physical Exam  Blood pressure 151/74, pulse 68, temperature 98.3 F (36.8 C), temperature source Oral, resp. rate 14, SpO2  96 %.  General: Pleasant, NAD Psych: Normal affect. Neuro: Alert and oriented X 3. Moves all extremities spontaneously. HEENT: Normal  Neck: Supple without bruits or JVD. Lungs:  Resp regular and unlabored, CTA. Heart: RRR no s3, s4, or murmurs. Abdomen: Soft, non-tender, non-distended, BS + x 4.  Extremities: No clubbing, cyanosis or edema. DP/PT/Radials 2+ and equal bilaterally.  Labs   Recent Labs  11/11/15 0556  TROPONINI <0.03   Lab Results  Component Value Date   WBC 3.7* 11/11/2015   HGB 13.1 11/11/2015   HCT 42.0 11/11/2015   MCV 80.6 11/11/2015   PLT 146* 11/11/2015    Recent Labs Lab 11/09/15 1042 11/11/15 0556  NA 137 140  K 4.1 4.2  CL 103 106  CO2 28 22  BUN 15 15  CREATININE 1.23 1.41*  CALCIUM 9.4 9.2  PROT 7.6  --   BILITOT 0.6  --   ALKPHOS 43  --   ALT 14  --   AST 19  --   GLUCOSE 101* 102*   Lab Results  Component Value Date   CHOL 180 11/09/2015   HDL 85.00 11/09/2015   LDLCALC 88 11/09/2015   TRIG 37.0 11/09/2015   No results found for: DDIMER   Radiology/Studies  Dg Chest Portable 1 View  11/11/2015  CLINICAL DATA:  Acute onset of right shoulder pain radiating to the jaw. Diaphoresis. Initial encounter. EXAM: PORTABLE CHEST 1 VIEW COMPARISON:  Chest radiograph from 11/29/2008 FINDINGS: The lungs are well-aerated and clear. There is no evidence of focal opacification, pleural effusion or pneumothorax. The cardiomediastinal silhouette is borderline enlarged. No acute osseous abnormalities are seen. IMPRESSION: Borderline cardiomegaly.  Lungs remain grossly clear. Electronically Signed   By: Roanna Raider M.D.   On: 11/11/2015 06:27   Cath 03/18/15 Left Heart Cath and Coronary Angiography    Conclusion    Severe 3-vessel disease with abnormal Cardiolite. Normal EF by echo with moderate aortic stenosis. Patient also has had a subacute CVA.   Currently has stable angina with moderate exertion.   I am going to have him evaluated  by CVTS. He would be a higher risk candidate with age and recent CVA. However, the extent of CAD would lead to incomplete  revascularization by PCI.   If he is deemed too high risk for CABG, mulitvessel PCI with rotoblator use would be an option.   I will increase Imdur to 60 mg daily and metoprolol to 25 mg bid for angina management.    Cath 06/01/2015 Coronary Stent Intervention    Conclusion     Dist LAD lesion, 90% stenosed. There is a 0% residual stenosis post intervention.  Mid LAD lesion, 80% stenosed. There is a 0% residual stenosis post intervention.  A drug-eluting stent was placed.  1st Mrg lesion, 90% stenosed. There is a 0% residual stenosis post intervention.  A drug-eluting stent was placed.  Prox Cx to Mid Cx lesion, 60% stenosed.  1. Successful angioplasty and Synergy biodegradable stent placement to the distal and mid LAD and high OM1. 2. The stenosis in the proximal left circumflex was noted to be around 60% and thus no PCI was performed on this.  Recommendations: Synergy stents were chosen due to calcifications and distal disease in order to be able to deliver the stent. Recommend dual antiplatelets therapy for at least 1 year and aggressive treatment of risk factors.   Echo 03/09/15 LV EF: 65% -  70%  ------------------------------------------------------------------- Indications:   CVA (I63.9).  ------------------------------------------------------------------- History:  Risk factors: PVD. GERD. DJD. Hyperglycemia. H/o prostatitis. Hypertension. Dyslipidemia.  ------------------------------------------------------------------- Study Conclusions  - Left ventricle: The cavity size was normal. There was mild concentric hypertrophy. Systolic function was vigorous. The estimated ejection fraction was in the range of 65% to 70%. Wall motion was normal; there were no regional wall motion abnormalities. - Aortic valve: There was  moderate stenosis. There was mild regurgitation. Valve area (VTI): 1.12 cm^2. Valve area (Vmax): 1.12 cm^2. Valve area (Vmean): 0.98 cm^2. While there may be a component of subvalvular obstruction, most of the outflow obstruction appears to occur at valve level.  ------------------------------------------------------------------- Labs, prior tests, procedures, and surgery: Transthoracic echocardiography (04/07/2013).   EF was 70%. Aortic valve: peak gradient of 26 mm Hg and mean gradient of 14 mm Hg.  Transthoracic echocardiography. M-mode, complete 2D, spectral Doppler, and color Doppler. Birthdate: Patient birthdate: 31-Mar-1927. Age: Patient is 82 yr old. Sex: Gender: male. BMI: 30 kg/m^2. Blood pressure:   156/90 Patient status: Outpatient. Study date: Study date: 03/09/2015. Study time: 03:26 PM. Location: [place] [facility]  ECG  Vent. rate 75 BPM PR interval 233 ms QRS duration 87 ms QT/QTc 419/468 ms P-R-T axes 45 22 31  ASSESSMENT AND PLAN  1. Unstable angina - Episode this morning is somewhat atypical however responded to some single nitroglycerin. Concerning for unstable angina. - He is scheduled to have a cardiac catheterization by Dr. Shirlee Latch 3/8. - Will admit and proceed with cardiac catheter adjacent during admission and cycle troponin..   2. CAD  - History of 3 vessel CAD status post multivessel PCI to the mid LAD, distal LAD and OM1 in 7/16. -Continue ASA, Plavix, Amlodipine, nitrates, beta-blocker and statin. Added Ranexa 500 mg Twice daily recently.   3. Aortic stenosis - Moderate AS by echo in 6/16 with mean gradient 28 mmHg. - Plan to get repeat echo prior to cath today.   4. History of CVA  - Continue ASA, Plavix, statin.   5. PAD  - Bilateral calf pain after walking about 100 yards. Extensive below-knee PAD. Medical management. Continue statin, Plavix, and ASA.   6. HTN  - Held Micardis HCT 2/28 due to low borderline BP.    - Lopressor minimally elevated today  systolic blood presser in 150s range. We will resume during admission. Will hold currently for cardiac catheterization and AKI  7. HL  - 11/09/2015: Cholesterol 180; HDL 85.00; LDL Cholesterol 88; Triglycerides 37.0; VLDL 7.4  - Continue statin  8. AKI - seems baseline creatinine 1.1-1.3.  - His creatinine was 1.23 3/1 and then Renexa was added. Cr of 1.41 today.  - Daily BMET  Signed, Bhagat,Bhavinkumar, PA-C 11/11/2015, 8:46 AM Pager [phone] The patient has been seen in conjunction with B. Bhagat, PAC. All aspects of care have been considered and discussed. The patient has been personally interviewed, examined, and all clinical data has been reviewed.   Mr. Hu underwent PCI by Dr. Kirke Corin on 06/01/15, receiving a mid and apical drug-eluting stent in the mid to distal first marginal stent. The patient has very diffuse distal coronary disease including a nondominant RCA and eccentric high-grade stenosis in the proximal circumflex just beyond the origin of the first marginal.  Over the past several weeks he has developed exertional dyspnea, and was scheduled to have elective coronary angiography by Dr. Shirlee Latch next week. He presents now with a history as noted above.  Digital images were reviewed.  Plan to proceed to coronary angiography today. After reviewing previous angiograms, medical therapy may be his best option. We need multiple orthogonal views of the proximal circumflex as there was a very eccentric lesion in this position on the diagnostic images by Dr. Shirlee Latch in July 2016 not visualized when stent procedure done.  The patient was counseled to undergo left heart catheterization, coronary angiography, and possible percutaneous coronary intervention with stent implantation. The procedural risks and benefits were discussed in detail. The risks discussed included death, stroke, myocardial infarction, life-threatening bleeding, limb ischemia,  kidney injury, allergy, and possible emergency cardiac surgery. The risk of these significant complications were estimated to occur less than 1% of the time. After discussion, the patient has agreed to proceed.

## 2015-11-11 NOTE — ED Notes (Signed)
Dr. Katrinka BlazingSmith Cards MD at bedside.

## 2015-11-11 NOTE — ED Notes (Signed)
Dr. Zavitz at the bedside.  

## 2015-11-11 NOTE — Care Management Obs Status (Signed)
MEDICARE OBSERVATION STATUS NOTIFICATION   Patient Details  Name: Ronnie Boyd MRN: [id number] Date of Birth: April 01, 1927   Medicare Observation Status Notification Given:  Yes    [name], Dewana Ammirati Clinton, RN 11/11/2015, 4:59 PM

## 2015-11-11 NOTE — ED Notes (Addendum)
Cardiology PA spoke with regarding need for admission orders. States that he will follow up with Dr. Katrinka BlazingSmith.

## 2015-11-11 NOTE — ED Provider Notes (Signed)
CSN: [id number]     Arrival date & time 11/11/15  1308 History   First MD Initiated Contact with Patient 11/11/15 0533     Chief Complaint  Patient presents with  . Shoulder Pain     (Consider location/radiation/quality/duration/timing/severity/associated sxs/prior Treatment) HPI Comments: 82 year old male with history of high blood pressure vascular disease, 3 stents, coronary artery disease, ischemic stroke presents with right shoulder/upper chest pain and diaphoresis. Fairly constant started around 3:30 in the morning and radiated to right jaw. Different than previous cardiac symptoms for which he had chest tightness. Patient had a few runs of bigeminy. Patient has cardiac cath planned next week. Patient follows with Dr. Shirlee Latch local cardiologist. Currently no symptoms resolved with nitroglycerin.  Patient is a 82 y.o. male presenting with shoulder pain. The history is provided by the patient.  Shoulder Pain Associated symptoms: no back pain, no fever and no neck pain     Past Medical History  Diagnosis Date  . Allergic rhinitis   . Hypertension   . PVD (peripheral vascular disease) (HCC)   . Hypercholesteremia   . GERD (gastroesophageal reflux disease)   . Hemorrhoids   . Prostatitis     hx of  . Aortic stenosis     mild echo 7/14  . Acid reflux   . Family history of adverse reaction to anesthesia     "daughter had problems when she was a teen; none since; it was related to asthma"  . Heart murmur   . OSA on CPAP 05/23/2015    "just started wearing mas" (06/01/2015)  . Borderline type 2 diabetes mellitus   . History of hiatal hernia     "dx'd years ago" (06/01/2015)  . Stroke (HCC) 02/09/2015    denies residual on 06/01/2015  . Degenerative joint disease   . Arthritis     "right shoulder" (06/01/2015)   Past Surgical History  Procedure Laterality Date  . Carpal tunnel release Bilateral 2006-2007  . Excisional hemorrhoidectomy  1989  . Cardiac catheterization [name] 03/18/2015     Procedure: Left Heart Cath and Coronary Angiography;  Surgeon: Laurey Morale, MD;  Location: [facility] INVASIVE CV LAB;  Service: Cardiovascular;  Laterality: [name];  . Cardiac catheterization [name] 06/01/2015    Procedure: Coronary Stent Intervention;  Surgeon: Iran Ouch, MD;  Location: [facility] INVASIVE CV LAB;  Service: Cardiovascular;  Laterality: [name];  . Cataract extraction w/ intraocular lens  implant, bilateral Bilateral ~ 2003   Family History  Problem Relation Age of Onset  . Arthritis    . Hypertension Mother   . Diabetes Mother 44  . Stroke Father 46  . Diabetes Brother    Social History  Substance Use Topics  . Smoking status: Never Smoker   . Smokeless tobacco: Never Used  . Alcohol Use: No    Review of Systems  Constitutional: Positive for diaphoresis. Negative for fever and chills.  HENT: Negative for congestion.   Eyes: Negative for visual disturbance.  Respiratory: Negative for shortness of breath.   Cardiovascular: Negative for chest pain.  Gastrointestinal: Negative for vomiting and abdominal pain.  Genitourinary: Negative for dysuria and flank pain.  Musculoskeletal: Negative for back pain, neck pain and neck stiffness.  Skin: Negative for rash.  Neurological: Negative for light-headedness and headaches.      Allergies  Review of patient's allergies indicates no known allergies.  Home Medications   Prior to Admission medications   Medication Sig Start Date End Date Taking? Authorizing Provider  alum &  mag hydroxide-simeth (MAALOX/MYLANTA) 200-200-20 MG/5ML suspension Take 15 mLs by mouth every 6 (six) hours as needed for indigestion or heartburn.   Yes Historical Provider, MD  amLODipine (NORVASC) 10 MG tablet Take 1 tablet (10 mg total) by mouth daily. 12/23/14  Yes Newt Lukes, MD  aspirin EC 81 MG tablet Take 81 mg by mouth daily.   Yes Historical Provider, MD  Azelastine-Fluticasone (DYMISTA) 137-50 MCG/ACT SUSP Place 1 spray into the nose daily  as needed (allergies).   Yes Historical Provider, MD  clopidogrel (PLAVIX) 75 MG tablet Take 1 tablet (75 mg total) by mouth daily. 03/17/15  Yes Micki Riley, MD  Coenzyme Q10 200 MG capsule Take 200 mg by mouth daily.  05/04/11  Yes Laurey Morale, MD  Garlic Oil 1000 MG CAPS Take 1 capsule by mouth daily.     Yes Historical Provider, MD  isosorbide mononitrate (IMDUR) 60 MG 24 hr tablet Take 1 tablet (60 mg total) by mouth daily. 09/06/15  Yes Laurey Morale, MD  loratadine (CLARITIN) 10 MG tablet Take 10 mg by mouth daily as needed for rhinitis.    Yes Historical Provider, MD  metoprolol tartrate (LOPRESSOR) 25 MG tablet Take 25 mg by mouth 2 (two) times daily.   Yes Historical Provider, MD  Multiple Vitamins-Minerals (CENTRUM SILVER) tablet Take 1 tablet by mouth daily.     Yes Historical Provider, MD  nitroGLYCERIN (NITROSTAT) 0.4 MG SL tablet Place 1 tablet (0.4 mg total) under the tongue every 5 (five) minutes as needed for chest pain. 06/02/15  Yes Azalee Course, PA  Omega-3 Fatty Acids (FISH OIL) 1000 MG CAPS Take 1 capsule by mouth daily.     Yes Historical Provider, MD  pantoprazole (PROTONIX) 40 MG tablet Take 1 tablet (40 mg total) by mouth daily. 10/18/15  Yes Laurey Morale, MD  potassium chloride SA (KLOR-CON M20) 20 MEQ tablet Take 1 tablet (20 mEq total) by mouth daily. 12/23/14  Yes Newt Lukes, MD  pravastatin (PRAVACHOL) 40 MG tablet TAKE 1 TABLET (40 MG TOTAL) BY MOUTH EVERY EVENING. 10/17/15  Yes Laurey Morale, MD  ranolazine (RANEXA) 500 MG 12 hr tablet Take 1 tablet (500 mg total) by mouth 2 (two) times daily. 11/08/15  Yes Beatrice Lecher, PA-C  tamsulosin (FLOMAX) 0.4 MG CAPS capsule Take 0.4 mg by mouth daily.   Yes Historical Provider, MD   BP 151/74 mmHg  Pulse 68  Temp(Src) 98.3 F (36.8 C) (Oral)  Resp 14  SpO2 96% Physical Exam  Constitutional: He is oriented to person, place, and time. He appears well-developed and well-nourished.  HENT:  Head: Normocephalic  and atraumatic.  Eyes: Conjunctivae are normal. Right eye exhibits no discharge. Left eye exhibits no discharge.  Neck: Normal range of motion. Neck supple. No tracheal deviation present.  Cardiovascular: Normal rate and regular rhythm.   Pulmonary/Chest: Effort normal and breath sounds normal.  Abdominal: Soft. He exhibits no distension. There is no tenderness. There is no guarding.  Musculoskeletal: He exhibits no edema.  Neurological: He is alert and oriented to person, place, and time.  Skin: Skin is warm. No rash noted.  Psychiatric: He has a normal mood and affect.  Nursing note and vitals reviewed.   ED Course  Procedures (including critical care time) Labs Review Labs Reviewed  CBC WITH DIFFERENTIAL/PLATELET - Abnormal; Notable for the following:    WBC 3.7 (*)    MCH 25.1 (*)    RDW 16.0 (*)  Platelets 146 (*)    All other components within normal limits  BASIC METABOLIC PANEL  TROPONIN I  I-STAT TROPOININ, ED    Imaging Review Dg Chest Portable 1 View  11/11/2015  CLINICAL DATA:  Acute onset of right shoulder pain radiating to the jaw. Diaphoresis. Initial encounter. EXAM: PORTABLE CHEST 1 VIEW COMPARISON:  Chest radiograph from 11/29/2008 FINDINGS: The lungs are well-aerated and clear. There is no evidence of focal opacification, pleural effusion or pneumothorax. The cardiomediastinal silhouette is borderline enlarged. No acute osseous abnormalities are seen. IMPRESSION: Borderline cardiomegaly.  Lungs remain grossly clear. Electronically Signed   By: Roanna RaiderJeffery  Chang M.D.   On: 11/11/2015 06:27   I have personally reviewed and evaluated these images and lab results as part of my medical decision-making.   EKG Interpretation None    EKG reviewed heart rate 75, QT normal, sinus, no acute ST elevation or ST depression.  MDM   Final diagnoses:  None  Right shoulder pain  Patient presents after sudden diaphoresis and right shoulder pain. Patient currently  asymptomatic. Cardiology consulted for further evaluation as patient has a cardiac cath planned in the near future. ASA.  The patients results and plan were reviewed and discussed.   Any x-rays performed were independently reviewed by myself.   Differential diagnosis were considered with the presenting HPI.  Medications - No data to display  Filed Vitals:   11/11/15 0528 11/11/15 0530 11/11/15 0545 11/11/15 0700  BP:  151/73 134/78 151/74  Pulse:  74 68 68  Temp:  98.3 F (36.8 C)    TempSrc:  Oral    Resp:  21 23 14   SpO2: 97% 100% 97% 96%    Final diagnoses:  None     Blane OharaJoshua Nereyda Bowler, MD 11/11/15 [phone]

## 2015-11-11 NOTE — Progress Notes (Signed)
Placed patient on CPAP for the night via auto-mode with minimum pressure set at 4cm and maximum pressure of 20cm

## 2015-11-12 ENCOUNTER — Observation Stay (HOSPITAL_BASED_OUTPATIENT_CLINIC_OR_DEPARTMENT_OTHER): Payer: Medicare Other

## 2015-11-12 ENCOUNTER — Other Ambulatory Visit: Payer: Self-pay | Admitting: Physician Assistant

## 2015-11-12 ENCOUNTER — Telehealth: Payer: Self-pay | Admitting: Physician Assistant

## 2015-11-12 ENCOUNTER — Encounter (HOSPITAL_COMMUNITY): Payer: Self-pay | Admitting: Physician Assistant

## 2015-11-12 ENCOUNTER — Encounter: Payer: Self-pay | Admitting: Internal Medicine

## 2015-11-12 DIAGNOSIS — I350 Nonrheumatic aortic (valve) stenosis: Secondary | ICD-10-CM

## 2015-11-12 DIAGNOSIS — E875 Hyperkalemia: Secondary | ICD-10-CM

## 2015-11-12 DIAGNOSIS — I25118 Atherosclerotic heart disease of native coronary artery with other forms of angina pectoris: Secondary | ICD-10-CM | POA: Diagnosis not present

## 2015-11-12 DIAGNOSIS — I351 Nonrheumatic aortic (valve) insufficiency: Secondary | ICD-10-CM | POA: Insufficient documentation

## 2015-11-12 LAB — CBC
HCT: 44.4 % (ref 39.0–52.0)
Hemoglobin: 13.9 g/dL (ref 13.0–17.0)
MCH: 25.1 pg — ABNORMAL LOW (ref 26.0–34.0)
MCHC: 31.3 g/dL (ref 30.0–36.0)
MCV: 80.1 fL (ref 78.0–100.0)
Platelets: 156 10*3/uL (ref 150–400)
RBC: 5.54 MIL/uL (ref 4.22–5.81)
RDW: 16.1 % — AB (ref 11.5–15.5)
WBC: 4.4 10*3/uL (ref 4.0–10.5)

## 2015-11-12 LAB — BASIC METABOLIC PANEL
Anion gap: 9 (ref 5–15)
BUN: 17 mg/dL (ref 6–20)
CALCIUM: 9 mg/dL (ref 8.9–10.3)
CO2: 21 mmol/L — ABNORMAL LOW (ref 22–32)
CREATININE: 1.42 mg/dL — AB (ref 0.61–1.24)
Chloride: 106 mmol/L (ref 101–111)
GFR calc Af Amer: 50 mL/min — ABNORMAL LOW (ref >60)
GFR, EST NON AFRICAN AMERICAN: 43 mL/min — AB (ref >60)
GLUCOSE: 101 mg/dL — AB (ref 65–99)
Potassium: 5.3 mmol/L — ABNORMAL HIGH (ref 3.5–5.1)
SODIUM: 136 mmol/L (ref 135–145)

## 2015-11-12 LAB — TROPONIN I

## 2015-11-12 NOTE — Progress Notes (Signed)
  Echocardiogram 2D Echocardiogram has been performed.  Ronnie Boyd, Ronnie Boyd 11/12/2015, 8:52 AM

## 2015-11-12 NOTE — Discharge Summary (Signed)
Discharge Summary    Patient ID: Ronnie Boyd,  MRN: [id number], DOB/AGE: Aug 27, 1927 82 y.o.  Admit date: 11/11/2015 Discharge date: 11/12/2015  Primary Care Provider: Pincus Sanes Primary Cardiologist: Dr. Shirlee Latch   Discharge Diagnoses    Principal Problem:   Unstable angina [facility]) Active Problems:   HYPERCHOLESTEROLEMIA   Essential hypertension   Peripheral vascular disease (HCC)   GERD   Aortic stenosis   OSA on CPAP   Diabetes (HCC)   Allergies No Known Allergies   History of Present Illness     Ronnie Boyd is a 82 y.o. male with a history HTN, diet-controlled diabetes, hyperlipidemia, moderate aortic stenosis, CVA, CAD, and PAD who presented to [facility] ED for evaluation of chest pain.   Patient was admitted in 6/16 with small right medial thalamic/mid brain lacunar infarct manifesting with balance difficulty and slight drooping of the right eyelid. Strokes signs completely resolved with no residual defect. He had a carotid US in 6/16 without significant stenosis. At that time, the patient also noted tightness in his chest with moderate exertion (yardwork, carrying a load). Echo showed normal EF and moderate AS in 6/16. He had an abnormal Cardiolite which was followed by [facility] in 7/16 showing significant 3 vessel disease. He was started on Imdur and metoprolol was increased. He was referred to cardiac surgery for evaluation as CABG was thought to be most likely to provide complete revascularization in this situation. He saw Dr Dorris Fetch. After discussion with neurology, it was decided that as long as he remains symptomatically stable, he should not have CABG until 3 months after CVA. Ultimately, it was elected to treat him percutaneously. He had Synergy DES biodegradable stents to the mLAD, dLAD, and OM1.   Seen by Dr. Shirlee Latch 10/13/15. He reported doing moderate exercise without angina. He did report bilateral claudication which was stable (walking about 100 yards,  L>R) => he had peripheral arterial dopplers with below-the-knee disease, plan to manage medically.   Last seen by Tereso Newcomer, PA 2/27 with complains of exertional angina (CCS class 2-3 symptoms). Case was discuss with primary cardiologist and he was for cardiac catheterization 3/8 to better evaluate symptoms. He was also scheduled to have a echocardiogram done today at 10:30 AM for evaluation of aortic stenosis. Added Ranexa 500 mg Twice daily.  However, he presented to the [facility] ED on 11/11/15 right sided shoulder pain that woke him from sleep. He was admitted for unstable angina.      Hospital Course     Consultants: none   1. Unstable angina - History of 3 vessel CAD status post multivessel PCI to the mid LAD, distal LAD and OM1 in 7/16 - admitted with chest pain. Cath 11/11/15 with small nondom RCA 90% prox, LAD 60%, Ramus 99%, Ramus 2 90%, LPDA 90%, ost LCX 40%. Continued patency of stents. LAD negative FFR. Severe diffuse small vessel CAD, recs for medical therapy. - med therapy with ASA, plavix, imdur 60, lopressor 25 bid, prava 40, ranexa 500 bid, norvasc 10.  - post cath Cr 1.42, seems to have variable baseline 1-1.4.  - we discussed increasing his ranexa, he prefers to hold off at this time and reevaluate at follow up  2. Aortic stenosis - Moderate AS by echo in 6/16 with mean gradient 28 mmHg. - repeat echo pending  3 History of CVA  - Continue ASA, Plavix, statin.   4 PAD  - Bilateral calf pain after walking about 100 yards. Extensive below-knee PAD.  Medical management. Continue statin, Plavix, and ASA.   5 HTN  - Held Micardis HCT 2/28 due to low borderline BP. Will continue to hold this with hyperkalemia (K 5.3). He is normotensive currently. If his BP is elevated at follow up appointment this can be added back.  6. HLD - 11/09/2015: Cholesterol 180; HDL 85.00; LDL 88; Triglycerides 37.0; VLDL 7.4  - Continue statin  8. CKD - seems baseline creatinine 1.1-1.4.  S/p cath he is 1.4. BMET next week  9. Hyperkalemia - Will have him hold his K supplementation and Mycardis for now. BMET next week   The patient has had an uncomplicated hospital course and is recovering well. The radial catheter site is stable. He has been seen by Dr. Wyline Mood today and deemed ready for discharge home. A staff message for all follow up appointments has been sent. Discharge medications are listed below.  _____________  Discharge Vitals Blood pressure 132/42, pulse 62, temperature 97.9 F (36.6 C), temperature source Oral, resp. rate 17, height 5\' 4"  (1.626 m), weight 185 lb 3 oz (84 kg), SpO2 99 %.  Filed Weights   11/12/15 0433  Weight: 185 lb 3 oz (84 kg)    Labs & Radiologic Studies     CBC  Recent Labs  11/11/15 0556 11/11/15 1705 11/12/15 0453  WBC 3.7* 5.5 4.4  NEUTROABS 1.7  --   --   HGB 13.1 14.9 13.9  HCT 42.0 45.9 44.4  MCV 80.6 80.8 80.1  PLT 146* 143* 156   Basic Metabolic Panel  Recent Labs  11/11/15 0556 11/11/15 1705 11/12/15 0453  NA 140  --  136  K 4.2  --  5.3*  CL 106  --  106  CO2 22  --  21*  GLUCOSE 102*  --  101*  BUN 15  --  17  CREATININE 1.41* 1.17 1.42*  CALCIUM 9.2  --  9.0   Liver Function Tests  Recent Labs  11/09/15 1042  AST 19  ALT 14  ALKPHOS 43  BILITOT 0.6  PROT 7.6  ALBUMIN 4.2   No results for input(s): LIPASE, AMYLASE in the last 72 hours. Cardiac Enzymes  Recent Labs  11/11/15 1705 11/11/15 2250 11/12/15 0453  TROPONINI <0.03 <0.03 <0.03   BNP Invalid input(s): POCBNP D-Dimer No results for input(s): DDIMER in the last 72 hours. Hemoglobin A1C  Recent Labs  11/09/15 1042  HGBA1C 6.8*   Fasting Lipid Panel  Recent Labs  11/09/15 1042  CHOL 180  HDL 85.00  LDLCALC 88  TRIG 37.0  CHOLHDL 2   Thyroid Function Tests No results for input(s): TSH, T4TOTAL, T3FREE, THYROIDAB in the last 72 hours.  Invalid input(s): FREET3  Dg Chest Portable 1 View  11/11/2015  CLINICAL  DATA:  Acute onset of right shoulder pain radiating to the jaw. Diaphoresis. Initial encounter. EXAM: PORTABLE CHEST 1 VIEW COMPARISON:  Chest radiograph from 11/29/2008 FINDINGS: The lungs are well-aerated and clear. There is no evidence of focal opacification, pleural effusion or pneumothorax. The cardiomediastinal silhouette is borderline enlarged. No acute osseous abnormalities are seen. IMPRESSION: Borderline cardiomegaly.  Lungs remain grossly clear. Electronically Signed   By: Roanna Raider M.D.   On: 11/11/2015 06:27     Diagnostic Studies/Procedures   11/11/15 Procedures    Intravascular Pressure Wire/FFR Study    Conclusion     Prox RCA lesion, 90% stenosed.  Prox LAD to Mid LAD lesion, 60% stenosed.  Lat Ramus lesion, 99% stenosed.  Ramus-2  lesion, 90% stenosed.  LPDA lesion, 90% stenosed.  Ost Cx to Prox Cx lesion, 40% stenosed.  1. Continued patency of the stented segments in the mid- and distal-LAD 2. Continued patency of the stented segment in the ramus intermedius 3. Severe stenosis of a small nondominant RCA 4. Moderate proximal LAD stenosis with negative pressure wire analysis 5. Mild LCx stenosis 6. Severe diffuse small vessel CAD appropriate for medical therapy   _____________    Disposition   Pt is being discharged home today in good condition.  Follow-up Plans & Appointments    Follow-up Information    Follow up with Marca Anconaalton McLean, MD.   Specialty:  Cardiology   Why:  The office will call you to make an appoinment., If you do not hear from them, please contact them., You should be seen within 1 week with a NP or PA   Contact information:   [address] [place] [address] [phone]        Discharge Medications   Current Discharge Medication List    CONTINUE these medications which have NOT CHANGED   Details  alum & mag hydroxide-simeth (MAALOX/MYLANTA) 200-200-20 MG/5ML suspension Take 15 mLs by mouth every 6  (six) hours as needed for indigestion or heartburn.    amLODipine (NORVASC) 10 MG tablet Take 1 tablet (10 mg total) by mouth daily. Qty: 90 tablet, Refills: 3    aspirin EC 81 MG tablet Take 81 mg by mouth daily.    Azelastine-Fluticasone (DYMISTA) 137-50 MCG/ACT SUSP Place 1 spray into the nose daily as needed (allergies).    clopidogrel (PLAVIX) 75 MG tablet Take 1 tablet (75 mg total) by mouth daily. Qty: 30 tablet, Refills: 11   Associated Diagnoses: Thalamic infarct, acute (HCC)    Coenzyme Q10 200 MG capsule Take 200 mg by mouth daily.     Garlic Oil 1000 MG CAPS Take 1 capsule by mouth daily.      isosorbide mononitrate (IMDUR) 60 MG 24 hr tablet Take 1 tablet (60 mg total) by mouth daily. Qty: 90 tablet, Refills: 3    loratadine (CLARITIN) 10 MG tablet Take 10 mg by mouth daily as needed for rhinitis.     metoprolol tartrate (LOPRESSOR) 25 MG tablet Take 25 mg by mouth 2 (two) times daily.    Multiple Vitamins-Minerals (CENTRUM SILVER) tablet Take 1 tablet by mouth daily.      nitroGLYCERIN (NITROSTAT) 0.4 MG SL tablet Place 1 tablet (0.4 mg total) under the tongue every 5 (five) minutes as needed for chest pain. Qty: 25 tablet, Refills: 3    Omega-3 Fatty Acids (FISH OIL) 1000 MG CAPS Take 1 capsule by mouth daily.      pantoprazole (PROTONIX) 40 MG tablet Take 1 tablet (40 mg total) by mouth daily. Qty: 30 tablet, Refills: 5    pravastatin (PRAVACHOL) 40 MG tablet TAKE 1 TABLET (40 MG TOTAL) BY MOUTH EVERY EVENING. Qty: 90 tablet, Refills: 3    ranolazine (RANEXA) 500 MG 12 hr tablet Take 1 tablet (500 mg total) by mouth 2 (two) times daily. Qty: 60 tablet, Refills: 11   Associated Diagnoses: Coronary artery disease involving native coronary artery with other forms of angina pectoris (HCC)    tamsulosin (FLOMAX) 0.4 MG CAPS capsule Take 0.4 mg by mouth daily.      STOP taking these medications     potassium chloride SA (KLOR-CON M20) 20 MEQ tablet  Outstanding Labs/Studies   BMET  Duration of Discharge Encounter   Greater than 30 minutes including physician time.  Byrd Hesselbach R PA-C 11/12/2015, 8:51 AM

## 2015-11-12 NOTE — Progress Notes (Signed)
Patient ID: Ronnie Boyd, male   DOB: 11-04-1926, 82 y.o.   MRN: [id number]    Subjective:    No symptoms overnight  Objective:   Temp:  [97.7 F (36.5 C)-98 F (36.7 C)] 98 F (36.7 C) (03/04 0433) Pulse Rate:  [43-73] 43 (03/04 0600) Resp:  [11-22] 13 (03/04 0600) BP: (89-181)/(51-99) 108/58 mmHg (03/04 0600) SpO2:  [92 %-100 %] 98 % (03/04 0600) Weight:  [185 lb 3 oz (84 kg)] 185 lb 3 oz (84 kg) (03/04 0433) Last BM Date: 11/10/15  Filed Weights   11/12/15 0433  Weight: 185 lb 3 oz (84 kg)    Intake/Output Summary (Last 24 hours) at 11/12/15 0739 Last data filed at 11/11/15 2233  Gross per 24 hour  Intake    150 ml  Output   1300 ml  Net  -1150 ml    Telemetry: SR, sinus brady  Exam:  General: NAD  HEENT: sclera clear, throat clear  Resp: CTAB  Cardiac: RRR, 3/6 systolic murmur RUSB, no jvd  GI: abdomen soft, NT, ND  MSK: no LE edema  Neuro: no focal deficits  Psych: appropriate affect  Lab Results:  Basic Metabolic Panel:  Recent Labs Lab 11/09/15 1042 11/11/15 0556 11/11/15 1705 11/12/15 0453  NA 137 140  --  136  K 4.1 4.2  --  5.3*  CL 103 106  --  106  CO2 28 22  --  21*  GLUCOSE 101* 102*  --  101*  BUN 15 15  --  17  CREATININE 1.23 1.41* 1.17 1.42*  CALCIUM 9.4 9.2  --  9.0    Liver Function Tests:  Recent Labs Lab 11/09/15 1042  AST 19  ALT 14  ALKPHOS 43  BILITOT 0.6  PROT 7.6  ALBUMIN 4.2    CBC:  Recent Labs Lab 11/11/15 0556 11/11/15 1705 11/12/15 0453  WBC 3.7* 5.5 4.4  HGB 13.1 14.9 13.9  HCT 42.0 45.9 44.4  MCV 80.6 80.8 80.1  PLT 146* 143* 156    Cardiac Enzymes:  Recent Labs Lab 11/11/15 1705 11/11/15 2250 11/12/15 0453  TROPONINI <0.03 <0.03 <0.03    BNP: No results for input(s): PROBNP in the last 8760 hours.  Coagulation:  Recent Labs Lab 11/08/15 1039  INR 1.22    ECG:   Medications:   Scheduled Medications: . amLODipine  10 mg Oral Daily  . aspirin EC  81 mg Oral  Daily  . clopidogrel  75 mg Oral Daily  . heparin  5,000 Units Subcutaneous 3 times per day  . isosorbide mononitrate  60 mg Oral Daily  . metoprolol tartrate  25 mg Oral BID  . multivitamin with minerals  1 tablet Oral Daily  . omega-3 acid ethyl esters  1 g Oral Daily  . pantoprazole  40 mg Oral Daily  . potassium chloride SA  20 mEq Oral Daily  . pravastatin  40 mg Oral q1800  . ranolazine  500 mg Oral BID  . sodium chloride flush  3 mL Intravenous Q12H  . tamsulosin  0.4 mg Oral Daily     Infusions:     PRN Medications:  sodium chloride, acetaminophen, alum & mag hydroxide-simeth, loratadine, nitroGLYCERIN, ondansetron (ZOFRAN) IV, sodium chloride flush     Assessment/Plan   1. Unstable angina - History of 3 vessel CAD status post multivessel PCI to the mid LAD, distal LAD and OM1 in 7/16 - admitted with chest pain. Cath 11/11/15 with small nondom RCA 90%  prox, LAD 60%, Ramus 99%, Ramus 2 90%, LPDA 90%, ost LCX 40%. Continued patency of stents. LAD negative FFR. Severe diffuse small vessel CAD, recs for medical therapy. - med therapy with ASA, plavix, imdur 60, lopressor 25 bid, prava 40, ranexa 500 bid, norvasc 10. We will increase ranexa to 1000mg  bid.  - post cath Cr 1.42, seems to have variable baseline 1-1.4.  - we discussed increasing his ranexa, he prefers to hold off at this time and reevaluate at follow up  3. Aortic stenosis - Moderate AS by echo in 6/16 with mean gradient 28 mmHg. - repeat echo pending  4. History of CVA  - Continue ASA, Plavix, statin.   5. PAD  - Bilateral calf pain after walking about 100 yards. Extensive below-knee PAD. Medical management. Continue statin, Plavix, and ASA.   6. HTN  - Held Micardis HCT 2/28 due to low borderline BP.  - Lopressor minimally elevated today systolic blood presser in 150s range. We will resume during admission. Will hold currently for cardiac catheterization and AKI  7. HL  - 11/09/2015:  Cholesterol 180; HDL 85.00; LDL Cholesterol 88; Triglycerides 37.0; VLDL 7.4  - Continue statin  8. AKI - seems baseline creatinine 1.1-1.4. S/p cath he is 1.4    Plan for discharge today. He will need f/u in 2-[redacted] weeks along with a BMET.    Dina RichJonathan Amen Staszak, M.D.

## 2015-11-12 NOTE — Telephone Encounter (Signed)
entered in error   

## 2015-11-14 ENCOUNTER — Encounter (HOSPITAL_COMMUNITY): Payer: Self-pay | Admitting: Cardiovascular Disease

## 2015-11-14 ENCOUNTER — Telehealth: Payer: Self-pay | Admitting: *Deleted

## 2015-11-14 ENCOUNTER — Encounter (HOSPITAL_COMMUNITY): Payer: Medicare Other

## 2015-11-14 NOTE — Telephone Encounter (Signed)
Pt was on TCM list admitted for Unstable again. Pt was d/c 11/12/15 will be f/u w/cardiologist Dr. Shirlee LatchMcLean...Raechel Chute/lmb

## 2015-11-16 ENCOUNTER — Encounter (HOSPITAL_COMMUNITY): Payer: Medicare Other

## 2015-11-17 ENCOUNTER — Other Ambulatory Visit (INDEPENDENT_AMBULATORY_CARE_PROVIDER_SITE_OTHER): Payer: Medicare Other | Admitting: *Deleted

## 2015-11-17 DIAGNOSIS — E875 Hyperkalemia: Secondary | ICD-10-CM

## 2015-11-17 LAB — BASIC METABOLIC PANEL
BUN: 19 mg/dL (ref 7–25)
CALCIUM: 8.9 mg/dL (ref 8.6–10.3)
CHLORIDE: 102 mmol/L (ref 98–110)
CO2: 23 mmol/L (ref 20–31)
CREATININE: 1.4 mg/dL — AB (ref 0.70–1.11)
Glucose, Bld: 94 mg/dL (ref 65–99)
Potassium: 4.6 mmol/L (ref 3.5–5.3)
Sodium: 134 mmol/L — ABNORMAL LOW (ref 135–146)

## 2015-11-17 NOTE — Progress Notes (Signed)
Cardiology Office Note   Date:  11/18/2015   ID:  Ronnie Boyd, DOB May 31, 1927, MRN [id number]  PCP:  Pincus Sanes, MD  Cardiologist:  Dr. Jearld Pies    Chief Complaint  Patient presents with  . Hospitalization Follow-up    no complaints      History of Present Illness: Ronnie Boyd is a 82 y.o. male who presents for post hospitalization for unstable angina.  But this was Rt arm discomfort and diaphoresis.    He has a history HTN, diet-controlled diabetes, hyperlipidemia, moderate aortic stenosis, CVA, CAD, and PAD.    Patient was admitted in 6/16 with small right medial thalamic/mid brain lacunar infarct manifesting with balance difficulty and slight drooping of the right eyelid. Strokes signs completely resolved with no residual defect. He had a carotid US in 6/16 without significant stenosis. At that time, the patient also noted tightness in his chest with moderate exertion (yardwork, carrying a load). Echo showed normal EF and moderate AS in 6/16. He had an abnormal Cardiolite which was followed by [facility] in 7/16 showing significant 3 vessel disease. He was started on Imdur and metoprolol was increased. He was referred to cardiac surgery for evaluation as CABG was thought to be most likely to provide complete revascularization in this situation. He saw Dr Dorris Fetch. After discussion with neurology, it was decided that as long as he remains symptomatically stable, he should not have CABG until 3 months after CVA. Ultimately, it was elected to treat him percutaneously. He had Synergy DES biodegradable stents to the mLAD, dLAD, and OM1.   Seen by Dr. Shirlee Latch 10/13/15. He reported doing moderate exercise without angina. He did report bilateral claudication which was stable (walking about 100 yards, L>R) => he had peripheral arterial dopplers with below-the-knee disease, plan to manage medically.   Last seen by Tereso Newcomer, PA 2/27 with complains of exertional angina (CCS class  2-3 symptoms). Case was discuss with primary cardiologist and he was for cardiac catheterization 3/8 to better evaluate symptoms. He was also scheduled to have a echocardiogram done today at 10:30 AM for evaluation of aortic stenosis. Added Ranexa 500 mg Twice daily  He came in early due to rt arm pain and diaphoresis. NTG did help- he had never had these symptoms before.  Underwent cardiac cath Cath 11/11/15 with small nondom RCA 90% prox, LAD 60%, Ramus 99%, Ramus 2 90%, LPDA 90%, ost LCX 40%. Continued patency of stents. LAD negative FFR. Severe diffuse small vessel CAD, recs for medical therapy. - med therapy with ASA, plavix, imdur 60, lopressor 25 bid, prava 40, ranexa 500 bid, norvasc 10.  - post cath Cr 1.42, seems to have variable baseline 1-1.4.  - we discussed increasing his ranexa, he prefered to hold off at discharge.  His Micardis HCT was held due to borderline BP and K+ 5.3..     Today he has had no further pain or problems.  He feels well. He is walking without problems.      Past Medical History  Diagnosis Date  . Allergic rhinitis   . Hypertension   . PVD (peripheral vascular disease) (HCC)   . Hypercholesteremia   . GERD (gastroesophageal reflux disease)   . Hemorrhoids   . Prostatitis     hx of  . Aortic stenosis     mild echo 7/14  . Borderline type 2 diabetes mellitus   . Family history of adverse reaction to anesthesia     "daughter had problems  when she was a teen; none since; it was related to asthma"  . Coronary artery disease   . OSA on CPAP since 05/23/2015  . History of hiatal hernia     "dx'd years ago" (06/01/2015)  . Stroke (HCC)   . Degenerative joint disease     Past Surgical History  Procedure Laterality Date  . Carpal tunnel release Bilateral 2006-2007  . Excisional hemorrhoidectomy  1989  . Cataract extraction w/ intraocular lens  implant, bilateral Bilateral ~ 2003  . Cardiac catheterization [name] 03/18/2015    Procedure: Left Heart Cath and  Coronary Angiography;  Surgeon: Laurey Morale, MD;  Location: [facility] INVASIVE CV LAB;  Service: Cardiovascular;  Laterality: [name];  . Cardiac catheterization [name] 06/01/2015    Procedure: Coronary Stent Intervention;  Surgeon: Iran Ouch, MD;  Location: [facility] INVASIVE CV LAB;  Service: Cardiovascular;  Laterality: [name];  . Cardiac catheterization  11/11/2015  . Cardiac catheterization [name] 11/11/2015    Procedure: Intravascular Pressure Wire/FFR Study;  Surgeon: Tonny Bollman, MD;  Location[facility] INVASIVE CV LAB;  Service: Cardiovascular;  Laterality: [name];     Current Outpatient Prescriptions  Medication Sig Dispense Refill  . alum & mag hydroxide-simeth (MAALOX/MYLANTA) 200-200-20 MG/5ML suspension Take 15 mLs by mouth every 6 (six) hours as needed for indigestion or heartburn.    Marland Kitchen amLODipine (NORVASC) 10 MG tablet Take 1 tablet (10 mg total) by mouth daily. 90 tablet 3  . aspirin EC 81 MG tablet Take 81 mg by mouth daily.    . Azelastine-Fluticasone (DYMISTA) 137-50 MCG/ACT SUSP Place 1 spray into the nose daily as needed (allergies).    . clopidogrel (PLAVIX) 75 MG tablet Take 1 tablet (75 mg total) by mouth daily. 30 tablet 11  . Coenzyme Q10 200 MG capsule Take 200 mg by mouth daily.     . Garlic Oil 1000 MG CAPS Take 1 capsule by mouth daily.      . isosorbide mononitrate (IMDUR) 60 MG 24 hr tablet Take 1 tablet (60 mg total) by mouth daily. 90 tablet 3  . loratadine (CLARITIN) 10 MG tablet Take 10 mg by mouth daily as needed for rhinitis.     . metoprolol tartrate (LOPRESSOR) 25 MG tablet Take 25 mg by mouth 2 (two) times daily.    . Multiple Vitamins-Minerals (CENTRUM SILVER) tablet Take 1 tablet by mouth daily.      . nitroGLYCERIN (NITROSTAT) 0.4 MG SL tablet Place 1 tablet (0.4 mg total) under the tongue every 5 (five) minutes as needed for chest pain. 25 tablet 3  . Omega-3 Fatty Acids (FISH OIL) 1000 MG CAPS Take 1 capsule by mouth daily.      . pantoprazole (PROTONIX) 40 MG tablet Take  1 tablet (40 mg total) by mouth daily. 30 tablet 5  . pravastatin (PRAVACHOL) 40 MG tablet TAKE 1 TABLET (40 MG TOTAL) BY MOUTH EVERY EVENING. 90 tablet 3  . ranolazine (RANEXA) 500 MG 12 hr tablet Take 1 tablet (500 mg total) by mouth 2 (two) times daily. 60 tablet 11  . tamsulosin (FLOMAX) 0.4 MG CAPS capsule Take 0.4 mg by mouth daily.     No current facility-administered medications for this visit.    Allergies:   Review of patient's allergies indicates no known allergies.    Social History:  The patient  reports that he has never smoked. He has never used smokeless tobacco. He reports that he does not drink alcohol or use illicit drugs.   Family History:  The patient's family history includes Diabetes in his brother; Diabetes (age of onset: 381) in his mother; Hypertension in his mother; Stroke (age of onset: 5841) in his father.    ROS:  General:no colds or fevers, no weight changes Skin:no rashes or ulcers HEENT:no blurred vision, no congestion CV:see HPI PUL:see HPI GI:no diarrhea constipation or melena, no indigestion GU:no hematuria, no dysuria MS:no joint pain, no claudication Neuro:no syncope, no lightheadedness Endo:no diabetes, no thyroid disease  Wt Readings from Last 3 Encounters:  11/18/15 181 lb 3.2 oz (82.192 kg)  11/12/15 185 lb 3 oz (84 kg)  11/08/15 182 lb 6.4 oz (82.736 kg)     PHYSICAL EXAM: VS:  BP 130/66 mmHg  Pulse 59  Ht 5\' 4"  (1.626 m)  Wt 181 lb 3.2 oz (82.192 kg)  BMI 31.09 kg/m2 , BMI Body mass index is 31.09 kg/(m^2). General:Pleasant affect, NAD Skin:Warm and dry, brisk capillary refill HEENT:normocephalic, sclera clear, mucus membranes moist Neck:supple, no JVD,  Heart:S1S2 RRR with 3/6 aortic murmur with radiation into his neck., no gallup, rub or click Lungs:clear without rales, rhonchi, or wheezes OZH:YQMVAbd:soft, non tender, + BS, do not palpate liver spleen or masses Ext:no lower ext edema, 2+ pedal pulses, 2+ radial pulses Neuro:alert and  oriented X 3, MAE, follows commands, + facial symmetry    EKG:  EKG is ordered today. The ekg ordered today demonstrates SB with 1st degree av block rate 59.     Recent Labs: 11/09/2015: ALT 14 11/12/2015: Hemoglobin 13.9; Platelets 156 11/17/2015: BUN 19; Creat 1.40*; Potassium 4.6; Sodium 134*    Lipid Panel    Component Value Date/Time   CHOL 180 11/09/2015 1042   CHOL 164 04/04/2015 1059   TRIG 37.0 11/09/2015 1042   TRIG 42 04/04/2015 1059   HDL 85.00 11/09/2015 1042   HDL 82 04/04/2015 1059   CHOLHDL 2 11/09/2015 1042   VLDL 7.4 11/09/2015 1042   LDLCALC 88 11/09/2015 1042   LDLCALC 74 04/04/2015 1059   LDLDIRECT 131.5 11/06/2011 1057       Other studies Reviewed: Additional studies/ records that were reviewed today include: hospital visit and cardiac caths.    Conclusion     Prox RCA lesion, 90% stenosed.  Prox LAD to Mid LAD lesion, 60% stenosed.  Lat Ramus lesion, 99% stenosed.  Ramus-2 lesion, 90% stenosed.  LPDA lesion, 90% stenosed.  Ost Cx to Prox Cx lesion, 40% stenosed.  1. Continued patency of the stented segments in the mid- and distal-LAD 2. Continued patency of the stented segment in the ramus intermedius 3. Severe stenosis of a small nondominant RCA 4. Moderate proximal LAD stenosis with negative pressure wire analysis 5. Mild LCx stenosis 6. Severe diffuse small vessel CAD appropriate for medical therapy    Coronary Diagrams    Diagnostic Diagram                ASSESSMENT AND PLAN:  1.  CAD with recent cath ant patent stents.  Small vessel disease.  We discussed increasing Ranexa but currently pt is without any chest discomfort or rt arm pain.  With his age I am hesitant to increase ranexa unless he has further problems but will review wit Dr. Shirlee LatchMcLean  2. Aortic stenosis will continue to follow  3. PAD- claudication is mild per pt.   4.  essentila HTN. Stable off micardis/HCT will recheck BMP in 2 weeks and resume if  stable today K+4.9.   5. CKD  NEEDS BMP K+ still elevated  and Cr 1.4 will recheck in 2 weeks with plans to resume micardis if stable.   6. Sleep apnea uses CPAP  He will follow up as scheduled with Dr. Shirlee Latch unless he has any problems.  If we do increase ranexa we should see back in several weeks.    Current medicines are reviewed with the patient today.  The patient Has no concerns regarding medicines.  The following changes have been made:  See above Labs/ tests ordered today include:see above  Disposition:   FU:  see above  Signed, Leone Brand, NP  11/18/2015 5:13 PM    [facility] [address] [place], [place], [place]  [address]/ [address] [place], [place] Phone: [phone]; Fax: [phone]  [phone]

## 2015-11-18 ENCOUNTER — Encounter (HOSPITAL_COMMUNITY): Payer: Medicare Other

## 2015-11-18 ENCOUNTER — Ambulatory Visit (INDEPENDENT_AMBULATORY_CARE_PROVIDER_SITE_OTHER): Payer: Medicare Other | Admitting: Cardiology

## 2015-11-18 ENCOUNTER — Encounter: Payer: Self-pay | Admitting: Cardiology

## 2015-11-18 VITALS — BP 130/66 | HR 59 | Ht 64.0 in | Wt 181.2 lb

## 2015-11-18 DIAGNOSIS — I739 Peripheral vascular disease, unspecified: Secondary | ICD-10-CM

## 2015-11-18 DIAGNOSIS — I10 Essential (primary) hypertension: Secondary | ICD-10-CM | POA: Diagnosis not present

## 2015-11-18 DIAGNOSIS — I2510 Atherosclerotic heart disease of native coronary artery without angina pectoris: Secondary | ICD-10-CM | POA: Diagnosis not present

## 2015-11-18 DIAGNOSIS — G4730 Sleep apnea, unspecified: Secondary | ICD-10-CM

## 2015-11-18 DIAGNOSIS — I350 Nonrheumatic aortic (valve) stenosis: Secondary | ICD-10-CM | POA: Diagnosis not present

## 2015-11-18 DIAGNOSIS — N183 Chronic kidney disease, stage 3 (moderate): Secondary | ICD-10-CM

## 2015-11-18 NOTE — Patient Instructions (Addendum)
Medication Instructions:   CONTINUE TO HOLD  POTASSIUM AND MICARDIS   Your physician recommends that you continue on your THE REST OF current medications as directed.   If you need a refill on your cardiac medications before your next appointment, please call your pharmacy.  Labwork: BMP IN 2 WEEKS    Testing/Procedures:  NONE ORDER TODAY    Follow-[facility]:  [facility]    Any Other Special Instructions Will Be Listed Below (If Applicable).

## 2015-11-21 ENCOUNTER — Encounter (HOSPITAL_COMMUNITY): Payer: Medicare Other

## 2015-11-22 ENCOUNTER — Telehealth: Payer: Self-pay | Admitting: *Deleted

## 2015-11-22 ENCOUNTER — Other Ambulatory Visit: Payer: Self-pay | Admitting: Cardiology

## 2015-11-22 NOTE — Telephone Encounter (Signed)
Per Nada BoozerLaura Ingold, NP, I called pt to advise him that:   Please let pt know I discussed Ranexa with Dr. Shirlee LatchMcLean and we will leave at 500 BID. If he develps any more chest pain we would increase. Thanks. Pt verbalized understanding.

## 2015-11-22 NOTE — Telephone Encounter (Signed)
Leone BrandLaura R Ingold, NP at 11/17/2015 10:53 PM  metoprolol tartrate (LOPRESSOR) 25 MG tabletTake 25 mg by mouth 2 (two) times daily  Patient Instructions     Medication Instructions:   CONTINUE TO HOLD POTASSIUM AND MICARDIS

## 2015-11-23 ENCOUNTER — Encounter (HOSPITAL_COMMUNITY): Payer: Medicare Other

## 2015-11-25 ENCOUNTER — Encounter (HOSPITAL_COMMUNITY): Payer: Medicare Other

## 2015-12-01 ENCOUNTER — Ambulatory Visit: Payer: Medicare Other | Admitting: Physician Assistant

## 2015-12-02 ENCOUNTER — Other Ambulatory Visit (INDEPENDENT_AMBULATORY_CARE_PROVIDER_SITE_OTHER): Payer: Medicare Other | Admitting: *Deleted

## 2015-12-02 DIAGNOSIS — I2510 Atherosclerotic heart disease of native coronary artery without angina pectoris: Secondary | ICD-10-CM

## 2015-12-02 LAB — BASIC METABOLIC PANEL
BUN: 18 mg/dL (ref 7–25)
CHLORIDE: 103 mmol/L (ref 98–110)
CO2: 26 mmol/L (ref 20–31)
CREATININE: 1.53 mg/dL — AB (ref 0.70–1.11)
Calcium: 9.1 mg/dL (ref 8.6–10.3)
Glucose, Bld: 92 mg/dL (ref 65–99)
Potassium: 4.4 mmol/L (ref 3.5–5.3)
Sodium: 138 mmol/L (ref 135–146)

## 2015-12-06 ENCOUNTER — Telehealth: Payer: Self-pay | Admitting: *Deleted

## 2015-12-06 DIAGNOSIS — I10 Essential (primary) hypertension: Secondary | ICD-10-CM

## 2015-12-06 MED ORDER — TELMISARTAN 40 MG PO TABS: 40.0000 mg | ORAL_TABLET | Freq: Every day | ORAL | Status: DC

## 2015-12-06 NOTE — Telephone Encounter (Signed)
Pt has been advised of his lab results and that we will restart him on micardis 40 mg taking 1 tablet daily (without the hctz), and to repeat BMET in 1 week.   [facility] medication sent to pt pharmacy, Pt verbalized understanding.

## 2015-12-06 NOTE — Telephone Encounter (Signed)
-----   Message from Leone BrandLaura R Ingold, NP sent at 12/05/2015  1:55 PM EDT ----- Pt needs to resume micardis 80 mg but not HCTZ, tell him I discussed with Shirlee LatchMcLean. And recheck BMP 7 days after starting. He will need new tables with micardis alone.  Thanks.

## 2015-12-13 ENCOUNTER — Other Ambulatory Visit (INDEPENDENT_AMBULATORY_CARE_PROVIDER_SITE_OTHER): Payer: Medicare Other | Admitting: *Deleted

## 2015-12-13 DIAGNOSIS — I10 Essential (primary) hypertension: Secondary | ICD-10-CM | POA: Diagnosis not present

## 2015-12-13 LAB — BASIC METABOLIC PANEL
BUN: 18 mg/dL (ref 7–25)
CO2: 24 mmol/L (ref 20–31)
Calcium: 8.6 mg/dL (ref 8.6–10.3)
Chloride: 106 mmol/L (ref 98–110)
Creat: 1.22 mg/dL — ABNORMAL HIGH (ref 0.70–1.11)
GLUCOSE: 96 mg/dL (ref 65–99)
POTASSIUM: 4.1 mmol/L (ref 3.5–5.3)
SODIUM: 136 mmol/L (ref 135–146)

## 2015-12-13 NOTE — Addendum Note (Signed)
Addended by: Tonita PhoenixBOWDEN, Arrionna Serena K on: 12/13/2015 09:51 AM   Modules accepted: Orders

## 2016-02-07 ENCOUNTER — Telehealth: Payer: Self-pay | Admitting: Cardiology

## 2016-02-07 DIAGNOSIS — I10 Essential (primary) hypertension: Secondary | ICD-10-CM

## 2016-02-07 DIAGNOSIS — I2510 Atherosclerotic heart disease of native coronary artery without angina pectoris: Secondary | ICD-10-CM

## 2016-02-07 MED ORDER — HYDROCHLOROTHIAZIDE 25 MG PO TABS: ORAL_TABLET | ORAL | Status: DC

## 2016-02-07 NOTE — Telephone Encounter (Signed)
Pt advised, verbalized understanding, BMET scheduled for 02/22/16

## 2016-02-07 NOTE — Telephone Encounter (Signed)
Pt states HCTZ was discontinued around the end of March 2017. Pt states for about 6 weeks he has noticed puffiness in his feet and ankles, pt states he can still get his shoes on. Pt states  that the puffiness builds up during the day and is better the next morning. Pt denies increase in shortness of breath or weight, pt does not weigh daily.  Pt states he is limiting salt in his diet and does not think he has been on his feet more than usual recently.  Pt advised I will forward to Dr Shirlee LatchMcLean for review.

## 2016-02-07 NOTE — Telephone Encounter (Signed)
Pt calling re ankle swelling after change in med in March -pls advise  [phone]

## 2016-02-07 NOTE — Telephone Encounter (Signed)
Can start HCTZ at 12.5 daily.  BMET in 2 wks.

## 2016-02-14 ENCOUNTER — Other Ambulatory Visit (HOSPITAL_COMMUNITY): Payer: Medicare Other

## 2016-02-16 ENCOUNTER — Other Ambulatory Visit: Payer: Self-pay | Admitting: Internal Medicine

## 2016-02-21 ENCOUNTER — Other Ambulatory Visit: Payer: Self-pay | Admitting: Internal Medicine

## 2016-02-21 NOTE — Telephone Encounter (Signed)
This was discontinued  - did he restart it?  Is he currently taking it?  Cardiology scheduled him to have blood work done tomorrow - lets see what the results are so we know if he needs it.

## 2016-02-21 NOTE — Telephone Encounter (Signed)
Should pt still be taking potassium, not on current med list.

## 2016-02-22 ENCOUNTER — Other Ambulatory Visit (INDEPENDENT_AMBULATORY_CARE_PROVIDER_SITE_OTHER): Payer: Medicare Other

## 2016-02-22 DIAGNOSIS — I10 Essential (primary) hypertension: Secondary | ICD-10-CM | POA: Diagnosis not present

## 2016-02-22 DIAGNOSIS — I2510 Atherosclerotic heart disease of native coronary artery without angina pectoris: Secondary | ICD-10-CM

## 2016-02-23 LAB — BASIC METABOLIC PANEL
BUN: 17 mg/dL (ref 7–25)
CALCIUM: 9.2 mg/dL (ref 8.6–10.3)
CO2: 22 mmol/L (ref 20–31)
Chloride: 101 mmol/L (ref 98–110)
Creat: 1.3 mg/dL — ABNORMAL HIGH (ref 0.70–1.11)
GLUCOSE: 87 mg/dL (ref 65–99)
Potassium: 4.8 mmol/L (ref 3.5–5.3)
Sodium: 140 mmol/L (ref 135–146)

## 2016-02-29 ENCOUNTER — Other Ambulatory Visit: Payer: Self-pay | Admitting: Neurology

## 2016-04-04 ENCOUNTER — Other Ambulatory Visit: Payer: Self-pay

## 2016-04-04 DIAGNOSIS — I2510 Atherosclerotic heart disease of native coronary artery without angina pectoris: Secondary | ICD-10-CM

## 2016-04-04 DIAGNOSIS — I10 Essential (primary) hypertension: Secondary | ICD-10-CM

## 2016-04-05 ENCOUNTER — Other Ambulatory Visit: Payer: Self-pay

## 2016-04-05 DIAGNOSIS — I2510 Atherosclerotic heart disease of native coronary artery without angina pectoris: Secondary | ICD-10-CM

## 2016-04-05 DIAGNOSIS — I10 Essential (primary) hypertension: Secondary | ICD-10-CM

## 2016-04-05 MED ORDER — HYDROCHLOROTHIAZIDE 25 MG PO TABS: ORAL_TABLET | ORAL | 1 refills | Status: DC

## 2016-04-05 NOTE — Telephone Encounter (Signed)
It looks like as of 02/07/16 the dose is HCTZ 1/2 of a 25mg  tablet (12.5mg  ) by mouth daily

## 2016-04-07 ENCOUNTER — Other Ambulatory Visit: Payer: Self-pay | Admitting: Cardiology

## 2016-04-09 ENCOUNTER — Telehealth: Payer: Self-pay | Admitting: Cardiology

## 2016-04-09 NOTE — Telephone Encounter (Signed)
LMTCB for pt 

## 2016-04-09 NOTE — Telephone Encounter (Signed)
Pt's dtr calling regarding pt taking two naps today in a bookstore and wants to see Dr. Shirlee Latch tomorrow-denies any other symptoms-pls call wife [phone]

## 2016-04-09 NOTE — Telephone Encounter (Signed)
Pt states he fell asleep in the book store today, pt states taking a "cat nap" that is not unusual for him.  Pt states this is not new, he is unsure what prompted his family to call today. Pt denies any other symptoms, including chest pain/lightheadedness/shortness of breath. Pt states he has been using his CPAP regularly.  Pt is not able to check his BP or pulse at this time. Pt requesting appt tomorrow.   Pt has been scheduled to see Tereso Newcomer, PA tomorrow at 2:45PM.  Pt states he will check his BP and pulse when he gets home.. Pt advised not to drive. Pt advised to seek medical care tonight if change in symptoms.

## 2016-04-10 ENCOUNTER — Encounter: Payer: Self-pay | Admitting: Physician Assistant

## 2016-04-10 ENCOUNTER — Ambulatory Visit (INDEPENDENT_AMBULATORY_CARE_PROVIDER_SITE_OTHER): Payer: Medicare Other | Admitting: Physician Assistant

## 2016-04-10 VITALS — BP 100/60 | HR 73 | Ht 64.0 in | Wt 183.0 lb

## 2016-04-10 DIAGNOSIS — I10 Essential (primary) hypertension: Secondary | ICD-10-CM

## 2016-04-10 DIAGNOSIS — R5383 Other fatigue: Secondary | ICD-10-CM

## 2016-04-10 DIAGNOSIS — I350 Nonrheumatic aortic (valve) stenosis: Secondary | ICD-10-CM | POA: Diagnosis not present

## 2016-04-10 DIAGNOSIS — I25118 Atherosclerotic heart disease of native coronary artery with other forms of angina pectoris: Secondary | ICD-10-CM

## 2016-04-10 DIAGNOSIS — G4730 Sleep apnea, unspecified: Secondary | ICD-10-CM

## 2016-04-10 DIAGNOSIS — E7800 Pure hypercholesterolemia, unspecified: Secondary | ICD-10-CM

## 2016-04-10 LAB — CBC WITH DIFFERENTIAL/PLATELET
BASOS PCT: 0 %
Basophils Absolute: 0 cells/uL (ref 0–200)
EOS ABS: 0 {cells}/uL — AB (ref 15–500)
EOS PCT: 0 %
HCT: 38.4 % — ABNORMAL LOW (ref 38.5–50.0)
Hemoglobin: 12.7 g/dL — ABNORMAL LOW (ref 13.2–17.1)
LYMPHS PCT: 10 %
Lymphs Abs: 1050 cells/uL (ref 850–3900)
MCH: 26.9 pg — ABNORMAL LOW (ref 27.0–33.0)
MCHC: 33.1 g/dL (ref 32.0–36.0)
MCV: 81.4 fL (ref 80.0–100.0)
MONOS PCT: 26 %
MPV: 10.4 fL (ref 7.5–12.5)
Monocytes Absolute: 2730 cells/uL — ABNORMAL HIGH (ref 200–950)
NEUTROS ABS: 6720 {cells}/uL (ref 1500–7800)
Neutrophils Relative %: 64 %
PLATELETS: 163 10*3/uL (ref 140–400)
RBC: 4.72 MIL/uL (ref 4.20–5.80)
RDW: 15 % (ref 11.0–15.0)
WBC: 10.5 10*3/uL (ref 3.8–10.8)

## 2016-04-10 MED ORDER — AMLODIPINE BESYLATE 5 MG PO TABS: 5.0000 mg | ORAL_TABLET | Freq: Every day | ORAL | 3 refills | Status: DC

## 2016-04-10 NOTE — Patient Instructions (Addendum)
Medication Instructions:  1. DECREASE AMLODIPINE TO 5 MG DAILY; NEW RX SENT IN TODAY Labwork: TODAY BMET, CBC W/DIFF, TSH Testing/Procedures: NONE Follow-Up: DR. Shirlee Latch OR SC[facility] WEAVER, PAC IN 1 MONTH SAME DAY DR. Shirlee Latch IS IN THE OFFICE Any Other Special Instructions Will Be Listed Below (If Applicable). 1. CHECK BLOOD PRESSURE DAILY FOR 2 WEEKS; MAKE SURE TO HAVE BP MACHINE CHECKED AT FIRE STATION OR PRIMARY CARE OFFICE 2. CALL DR. DOHMEIER FOR AN EARLIER APPT FOR YOUR SLEEP APNEA If you need a refill on your cardiac medications before your next appointment, please call your pharmacy.

## 2016-04-10 NOTE — Progress Notes (Signed)
Cardiology Office Note:    Date:  04/10/2016   ID:  Ronnie Boyd, DOB [birth year], MRN [id number]  PCP:  Pincus Sanes, MD  Cardiologist:  Dr. Marca Ancona   Electrophysiologist:  Dr. Lorine Bears   Referring MD: Pincus Sanes, MD   Chief Complaint  Patient presents with  . Fatigue    History of Present Illness:    Ronnie Boyd is a 82 y.o. male with a hx of HTN, diet-controlled diabetes, hyperlipidemia, moderate aortic stenosis, CVA, CAD, and PAD. Patient was admitted in 6/16 with small right medial thalamic/mid brain lacunar infarct manifesting with balance difficulty and slight drooping of the right eyelid. Symptoms completely resolved with no residual defect. He had a carotid US in 6/16 without significant stenosis.   In 6/16, the patient also noted tightness in his chest with moderate exertion (yardwork, carrying a load). Echo showed normal EF and moderate AS in 6/16. He had an abnormal Cardiolite which was followed by LHC in 7/16 showing significant 3 vessel disease. He was started on Imdur and metoprolol was increased. He was referred to Dr Dorris Fetch. After discussion with neurology, it was decided that as long as he remains symptomatically stable, he should not have CABG until 3 months after CVA. Ultimately, it was elected to treat him percutaneously. He had Synergy DES biodegradable stents to the mLAD, dLAD, and OM1.   He was seen in 2/17 with complaints of exertional angina. Cardiac catheterization was performed and demonstrated continued patency of the stented segments in the mid and distal LAD, continued patency of the stented segment in the RI, severe stenosis of a small nondominant RCA and moderate proximal LAD stenosis. The proximal LAD stenosis was negative by pressure wire analysis. It was felt that medical therapy was appropriate for his severe diffuse small vessel CAD.  Last seen by Nada Boozer, NP in 3/17.  His family called in yesterday with  complaints of fatigue. He was added on today for further evaluation.  He is here today with his wife, 2 daughters. Over the past couple of weeks, he has noted increasing daytime hypersomnolence. His wife also notes that his breathing seems to be more labored while wearing CPAP. He denies significant worsening with dyspnea on exertion. He denies chest discomfort. He denies syncope. He denies orthopnea, PND. He denies significant pedal edema. He denies fevers, chills, cough. He denies melena or hematochezia.   Prior CV studies that were reviewed today include:    Echo 11/12/15 Vigorous LVF, EF 65-70%, normal wall motion, moderate aortic stenosis, mean gradient 28 mmHg/peak 52 mmHg, mild AI  LHC 11/11/15 LAD proximal 60% - FFR 0.91 >> 0.85 at peak hyperemia, mid stent patent, distal stent patent RI stent patent, then 90%, lateral ramus 99% with inferior subbranch 90% LCx ostial 40%, L PDA 90% distal-2 small for PCI RCA proximal 90%-unchanged. 1. Continued patency of the stented segments in the mid- and distal-LAD 2. Continued patency of the stented segment in the ramus intermedius 3. Severe stenosis of a small nondominant RCA 4. Moderate proximal LAD stenosis with negative pressure wire analysis 5. Mild LCx stenosis 6. Severe diffuse small vessel CAD appropriate for medical therapy   PCI 06/01/15 2.75 x 16 mm Synergy DES to mid LAD 2.25 x 20 mm Synergy DES to distal LAD 2.25 x 20 mm Synergy DES to OM1 LCx prox 60% - treated medically  LHC 03/18/15 LAD prox 60%, mid 80%, distal 80-90% LCx prox 70+%, high OM proximal 90% RCA  ostial 80%, prox 95%, mid 90%  Echo 6/16 Mild LVH, vigorous LV function, EF 65-70%, normal wall motion, moderate aortic stenosis (mean gradient 28 mmHg, peak 46 mmHg), mild AI  Carotid US 6/16 Bilateral ICA 1-49%  Myoview 6/16 Intermediate risk lexiscan nuclear stress test demonstrating a medium in size, moderate in intensity defect involving the apical lateral and  mid-distal lateral-anterolateral wall (extent 22%) consistent with ischemia.The patient had baseline inferolateral T wave inversion and developed 1 mm downsloping ST depression inferiorly and had occasional to frequent PVC's during stress and in recovery. Global EF is preserved but there is mild distal lateral milf hypocontactility. Clinical correlation is recommended.  Past Medical History:  Diagnosis Date  . Allergic rhinitis   . Aortic stenosis    mild echo 7/14  . Borderline type 2 diabetes mellitus   . Coronary artery disease   . Degenerative joint disease   . Family history of adverse reaction to anesthesia    "daughter had problems when she was a teen; none since; it was related to asthma"  . GERD (gastroesophageal reflux disease)   . Hemorrhoids   . History of hiatal hernia    "dx'd years ago" (06/01/2015)  . Hypercholesteremia   . Hypertension   . OSA on CPAP since 05/23/2015  . Prostatitis    hx of  . PVD (peripheral vascular disease) (HCC)   . Stroke (HCC)   1. HTN 2. Hyperlipidemia: Myalgias with Crestor.  3. Diabetes mellitus: diet-controlled 4. Allergic rhinitis 5. CAD: Myoview (4/05) with no ischemia or infarction. LHC (7/16) with 60% pLAD, 80% mLAD, 80-90% dLAD, 70+% proximal LCx after high OM, 90% proximal high OM with diffuse severe distal disease, nondominant RCA with 80% ostial stenosis, 90% proximal stenosis, and 90% mid vessel stenosis. 9/16 PCI with Synergy biodegradable DES to dLAD, mLAD, and OM1.  6. PAD: ABIs 2005 with 0.92 on right, 0.78 on left. Peripheral arterial evaluation (5/12): ABI 0.82 right/0.79 left, TBI 0.69 right/0.54 left (not at risk of tissue loss). ABIs (12/13) with 0.84 right, 0.97 left (improved). Peripheral arterial dopplers (8/16) with ABI 0.76 on right, 0.73 on left => occluded bilateral anterior tibial arteries and left posterior tibial artery.  7. GERD 8. Prostatitis 9. S/p inguinal hernia repair. 10. Aortic stenosis: Moderate  by echo. Echo (3/12) with mild LVH, mild to moderate focal basal septal hypertrophy with no LV outflow tract gradient, mild aortic stenosis, normal RV size and systolic function. Echo (6/16) with EF 65-70%, moderate AS with mean gradient 28 mmHg, AVA 1.12 cm^2.  11. CVA (6/16): Right medial thalamic/midbrain lacunar infarct. 6/16 carotid dopplers without significant stenosis.  12. OSA: CPAP recommended.   Past Surgical History:  Procedure Laterality Date  . CARDIAC CATHETERIZATION [name] 03/18/2015   Procedure: Left Heart Cath and Coronary Angiography;  Surgeon: Laurey Morale, MD;  Location: [facility] INVASIVE CV LAB;  Service: Cardiovascular;  Laterality: [name];  . CARDIAC CATHETERIZATION [name] 06/01/2015   Procedure: Coronary Stent Intervention;  Surgeon: Iran Ouch, MD;  Location: [facility] INVASIVE CV LAB;  Service: Cardiovascular;  Laterality: [name];  . CARDIAC CATHETERIZATION  11/11/2015  . CARDIAC CATHETERIZATION [name] 11/11/2015   Procedure: Intravascular Pressure Wire/FFR Study;  Surgeon: Tonny Bollman, MD;  Location: [facility] INVASIVE CV LAB;  Service: Cardiovascular;  Laterality: [name];  . CARPAL TUNNEL RELEASE Bilateral 2006-2007  . CATARACT EXTRACTION W/ INTRAOCULAR LENS  IMPLANT, BILATERAL Bilateral ~ 2003  . EXCISIONAL HEMORRHOIDECTOMY  1989    Current Medications: Outpatient Medications Prior to Visit  Medication Sig Dispense Refill  . alum & mag hydroxide-simeth (MAALOX/MYLANTA) 200-200-20 MG/5ML suspension Take 15 mLs by mouth every 6 (six) hours as needed for indigestion or heartburn.    Marland Kitchen aspirin EC 81 MG tablet Take 81 mg by mouth daily.    . Azelastine-Fluticasone (DYMISTA) 137-50 MCG/ACT SUSP Place 1 spray into the nose daily as needed (allergies).    . clopidogrel (PLAVIX) 75 MG tablet TAKE 1 TABLET (75 MG TOTAL) BY MOUTH DAILY. 30 tablet 6  . Coenzyme Q10 200 MG capsule Take 200 mg by mouth daily.     . Garlic Oil 1000 MG CAPS Take 1 capsule by mouth daily.      . hydrochlorothiazide  (HYDRODIURIL) 25 MG tablet Take 1/2 tablet by mouth daily 15 tablet 1  . isosorbide mononitrate (IMDUR) 60 MG 24 hr tablet Take 1 tablet (60 mg total) by mouth daily. 90 tablet 3  . loratadine (CLARITIN) 10 MG tablet Take 10 mg by mouth daily as needed for rhinitis.     . metoprolol tartrate (LOPRESSOR) 25 MG tablet TAKE 1 TABLET BY MOUTH TWICE A DAY 60 tablet 7  . Multiple Vitamins-Minerals (CENTRUM SILVER) tablet Take 1 tablet by mouth daily.      . nitroGLYCERIN (NITROSTAT) 0.4 MG SL tablet Place 1 tablet (0.4 mg total) under the tongue every 5 (five) minutes as needed for chest pain. 25 tablet 3  . Omega-3 Fatty Acids (FISH OIL) 1000 MG CAPS Take 1 capsule by mouth daily.      . pantoprazole (PROTONIX) 40 MG tablet Take 1 tablet (40 mg total) by mouth daily. 30 tablet 5  . pravastatin (PRAVACHOL) 40 MG tablet TAKE 1 TABLET (40 MG TOTAL) BY MOUTH EVERY EVENING. 90 tablet 3  . ranolazine (RANEXA) 500 MG 12 hr tablet Take 1 tablet (500 mg total) by mouth 2 (two) times daily. 60 tablet 11  . tamsulosin (FLOMAX) 0.4 MG CAPS capsule Take 0.4 mg by mouth daily.    Marland Kitchen telmisartan (MICARDIS) 40 MG tablet Take 1 tablet (40 mg total) by mouth daily. 90 tablet 1  . amLODipine (NORVASC) 10 MG tablet TAKE 1 TABLET (10 MG TOTAL) BY MOUTH DAILY. 90 tablet 3   No facility-administered medications prior to visit.       Allergies:   Review of patient's allergies indicates no known allergies.   Social History   Social History  . Marital status: Married    Spouse name: [name]  . Number of children: [name]  . Years of education: [name]   Occupational History  . retired    Social History Main Topics  . Smoking status: Never Smoker  . Smokeless tobacco: Never Used  . Alcohol use No  . Drug use: No  . Sexual activity: Not Currently   Other Topics Concern  . None   Social History Narrative   Daughter is [name] Psychologist, sport and exercise at [employer])     Family History:  The patient's family history  includes Diabetes in his brother; Diabetes (age of onset: 67) in his mother; Hypertension in his mother; Stroke (age of onset: 33) in his father.   ROS:   Please see the history of present illness.    ROS All other systems reviewed and are negative.   EKGs/Labs/Other Test Reviewed:    EKG:  EKG is  ordered today.  The ekg ordered today demonstrates NSR, HR 73, first-degree AV block, PR 218 ms, septal Q waves, no significant change compared to prior tracings  Recent Labs: 11/09/2015: ALT 14 11/12/2015: Hemoglobin 13.9; Platelets 156 02/22/2016: BUN 17; Creat 1.30; Potassium 4.8; Sodium 140   Recent Lipid Panel    Component Value Date/Time   CHOL 180 11/09/2015 1042   CHOL 164 04/04/2015 1059   TRIG 37.0 11/09/2015 1042   TRIG 42 04/04/2015 1059   HDL 85.00 11/09/2015 1042   HDL 82 04/04/2015 1059   CHOLHDL 2 11/09/2015 1042   VLDL 7.4 11/09/2015 1042   LDLCALC 88 11/09/2015 1042   LDLCALC 74 04/04/2015 1059   LDLDIRECT 131.5 11/06/2011 1057     Physical Exam:    VS:  BP 100/60   Pulse 73   Ht  (1.626 m)   Wt 183 lb (83 kg)   BMI 31.41 kg/m     Wt Readings from Last 3 Encounters:  04/10/16 183 lb (83 kg)  11/18/15 181 lb 3.2 oz (82.2 kg)  11/12/15 185 lb 3 oz (84 kg)     Physical Exam  Constitutional: He is oriented to person, place, and time. He appears well-developed and well-nourished. No distress.  HENT:  Head: Normocephalic and atraumatic.  Neck: No JVD present.  Cardiovascular: Normal rate, regular rhythm, S1 normal and S2 normal.   Murmur heard.  Harsh crescendo-decrescendo systolic murmur is present with a grade of 2/6  at the upper right sternal border Pulmonary/Chest: Effort normal and breath sounds normal. He has no wheezes. He has no rales.  Abdominal: Soft. There is no tenderness.  Musculoskeletal:  Trace bilateral LE edema  Neurological: He is alert and oriented to person, place, and time.  Skin: Skin is warm and dry.  Psychiatric: He has a  normal mood and affect.    ASSESSMENT:    1. Other fatigue   2. Coronary artery disease involving native coronary artery with other forms of angina pectoris (HCC)   3. Aortic stenosis   4. Essential hypertension   5. HYPERCHOLESTEROLEMIA   6. Sleep apnea    PLAN:    In order of problems listed above:  1. Fatigue - Etiology of his fatigue is entirely clear. His blood pressure is somewhat low today. Blood pressure readings at home or higher. He is not bradycardic. He does sleep with CPAP. His wife has noted more labored breathing while using his CPAP. Last echo was done in 3/17 and demonstrated moderate aortic stenosis without significant change since prior echo.  -  Obtain BMET, CBC, TSH  -  Decrease amlodipine to 5 mg daily  -  Follow-up with sleep medicine  2. CAD - Hx of 3 vessel CAD s/p multivessel PCI to the mid LAD, distal LAD and OM1 in 7/16. LHC in 2/17 demonstrated patent stents and and severe stenosis of a small non-dominant RCA - to be treated medically.    -  Continue Ranexa, Amlodipine, ASA, Plavix, nitrates, beta-blocker, statin.   3. Aortic stenosis - Moderate AS by echo in 3/17 with mean gradient 28 mmHg.   If symptoms continue, question if we should consider dobutamine echo to better assess his aortic stenosis.  4. HTN  - As noted, his blood pressure is somewhat low today. Reduce dose of amlodipine. I have asked him to get his blood pressure machine checked and keep an eye on his blood pressure between now and his next follow-up.    5. HL - Continue statin. LDL was 88 in 3/17.      6. OSA - As noted, he seems to have more labored breathing while sleeping  with CPAP. I have asked him to follow-up with his sleep medicine doctor to further evaluate.   Medication Adjustments/Labs and Tests Ordered: Current medicines are reviewed at length with the patient today.  Concerns regarding medicines are outlined above.  Medication changes, Labs and Tests ordered today are  outlined in the Patient Instructions noted below. Patient Instructions  Medication Instructions:  1. DECREASE AMLODIPINE TO 5 MG DAILY; NEW RX SENT IN TODAY Labwork: TODAY BMET, CBC W/DIFF, TSH Testing/Procedures: NONE Follow-Up: DR. Shirlee Latch OR Kooper Chriswell, PAC IN 1 MONTH SAME DAY DR. Shirlee Latch IS IN THE OFFICE Any Other Special Instructions Will Be Listed Below (If Applicable). 1. CHECK BLOOD PRESSURE DAILY FOR 2 WEEKS; MAKE SURE TO HAVE BP MACHINE CHECKED AT FIRE STATION OR PRIMARY CARE OFFICE 2. CALL DR. DOHMEIER FOR AN EARLIER APPT FOR YOUR SLEEP APNEA If you need a refill on your cardiac medications before your next appointment, please call your pharmacy.  Signed, Tereso Newcomer, PA-C  04/10/2016 5:32 PM    [facility] [address] [place], [place], [place]  [address] Phone: [phone]; Fax: [phone]

## 2016-04-11 ENCOUNTER — Telehealth: Payer: Self-pay | Admitting: *Deleted

## 2016-04-11 LAB — TSH: TSH: 0.6 m[IU]/L (ref 0.40–4.50)

## 2016-04-11 LAB — BASIC METABOLIC PANEL
BUN: 17 mg/dL (ref 7–25)
CHLORIDE: 100 mmol/L (ref 98–110)
CO2: 21 mmol/L (ref 20–31)
CREATININE: 1.35 mg/dL — AB (ref 0.70–1.11)
Calcium: 8.7 mg/dL (ref 8.6–10.3)
Glucose, Bld: 106 mg/dL — ABNORMAL HIGH (ref 65–99)
Potassium: 4.1 mmol/L (ref 3.5–5.3)
Sodium: 134 mmol/L — ABNORMAL LOW (ref 135–146)

## 2016-04-11 NOTE — Telephone Encounter (Signed)
Both pt and wife have been notified of lab results by phone with verbal understanding.  

## 2016-04-12 ENCOUNTER — Encounter: Payer: Self-pay | Admitting: Family Medicine

## 2016-04-12 ENCOUNTER — Ambulatory Visit (INDEPENDENT_AMBULATORY_CARE_PROVIDER_SITE_OTHER): Payer: Medicare Other | Admitting: Family Medicine

## 2016-04-12 ENCOUNTER — Ambulatory Visit: Payer: Medicare Other | Admitting: Family Medicine

## 2016-04-12 VITALS — BP 146/70 | HR 84 | Temp 100.4°F | Ht 64.0 in | Wt 183.7 lb

## 2016-04-12 DIAGNOSIS — R05 Cough: Secondary | ICD-10-CM | POA: Diagnosis not present

## 2016-04-12 DIAGNOSIS — R059 Cough, unspecified: Secondary | ICD-10-CM

## 2016-04-12 DIAGNOSIS — R509 Fever, unspecified: Secondary | ICD-10-CM | POA: Diagnosis not present

## 2016-04-12 MED ORDER — DOXYCYCLINE HYCLATE 100 MG PO TABS: 100.0000 mg | ORAL_TABLET | Freq: Two times a day (BID) | ORAL | 0 refills | Status: DC

## 2016-04-12 NOTE — Patient Instructions (Signed)
Please take antibiotic as directed and follow up if symptoms do not improve with treatment in 2 to 3 days, worsen, or you develop a fever >101.  Please discontinue use of Nyquil and you may use Delsym for cough which you can purchase at your pharmacy.  Also, you may use Claritin or Allegra for allergic rhinitis.  Please follow up early next week with your provider to assess how medication is working or sooner to be medically evaluated if symptoms worsen.

## 2016-04-12 NOTE — Progress Notes (Signed)
Pre visit review using our clinic review tool, if applicable. No additional management support is needed unless otherwise documented below in the visit note. 

## 2016-04-12 NOTE — Progress Notes (Signed)
Subjective:    Patient ID: Ronnie Boyd, male    DOB: 1926/10/22, 82 y.o.   MRN: [id number]  HPI   Ronnie Boyd is an 82 year old male who presents today with a cough for 4 days. Associated chills, fatigue, rhinitis, and post nasal drip are present. He denies sinus pressure/pain, ear pain, tooth pain, N/V/D, symptoms of GERD, or pleuritic chest pain. No denies recent sick contact exposure or recent antibiotic therapy however daughter states that he has traveled twice this month and attended a large family funeral with exposure to many people. No history of asthma/bronchitis or smoking. He has a history of CAD, HTN, hyperlipidemia, diet-controlled diabetes, moderate aortic stenosis, CVA, PAD, and GERD. He also has a history OSA and uses CPAP at night.  Treatment at home with Nyquil with limited benefit and this has caused significant drowsiness.  Review of Systems  Constitutional: Positive for chills, fatigue and fever.  HENT: Positive for postnasal drip. Negative for ear pain, sinus pressure, sneezing and sore throat.   Eyes: Negative for visual disturbance.  Respiratory: Positive for cough.   Cardiovascular: Negative for chest pain, palpitations and leg swelling.  Musculoskeletal: Negative for arthralgias and myalgias.  Skin: Negative for rash.  Allergic/Immunologic: Positive for environmental allergies.  Neurological: Negative for dizziness, light-headedness and headaches.   Past Medical History:  Diagnosis Date  . Allergic rhinitis   . Aortic stenosis    mild echo 7/14  . Borderline type 2 diabetes mellitus   . Coronary artery disease   . Degenerative joint disease   . Family history of adverse reaction to anesthesia    "daughter had problems when she was a teen; none since; it was related to asthma"  . GERD (gastroesophageal reflux disease)   . Hemorrhoids   . History of hiatal hernia    "dx'd years ago" (06/01/2015)  . Hypercholesteremia   . Hypertension   . OSA on CPAP  since 05/23/2015  . Prostatitis    hx of  . PVD (peripheral vascular disease) (HCC)   . Stroke [facility])      Social History   Social History  . Marital status: Married    Spouse name: [name]  . Number of children: [name]  . Years of education: [name]   Occupational History  . retired    Social History Main Topics  . Smoking status: Never Smoker  . Smokeless tobacco: Never Used  . Alcohol use No  . Drug use: No  . Sexual activity: Not Currently   Other Topics Concern  . Not on file   Social History Narrative   Daughter is [name] Psychologist, sport and exercise at [employer])    Past Surgical History:  Procedure Laterality Date  . CARDIAC CATHETERIZATION [name] 03/18/2015   Procedure: Left Heart Cath and Coronary Angiography;  Surgeon: Laurey Morale, MD;  Location: [facility] INVASIVE CV LAB;  Service: Cardiovascular;  Laterality: [name];  . CARDIAC CATHETERIZATION [name] 06/01/2015   Procedure: Coronary Stent Intervention;  Surgeon: Iran Ouch, MD;  Location: [facility] INVASIVE CV LAB;  Service: Cardiovascular;  Laterality: [name];  . CARDIAC CATHETERIZATION  11/11/2015  . CARDIAC CATHETERIZATION [name] 11/11/2015   Procedure: Intravascular Pressure Wire/FFR Study;  Surgeon: Tonny Bollman, MD;  Location: [facility] INVASIVE CV LAB;  Service: Cardiovascular;  Laterality: [name];  . CARPAL TUNNEL RELEASE Bilateral 2006-2007  . CATARACT EXTRACTION W/ INTRAOCULAR LENS  IMPLANT, BILATERAL Bilateral ~ 2003  . EXCISIONAL HEMORRHOIDECTOMY  1989    Family History  Problem  Relation Age of Onset  . Hypertension Mother   . Diabetes Mother 86  . Stroke Father 80  . Arthritis    . Diabetes Brother     No Known Allergies  Current Outpatient Prescriptions on File Prior to Visit  Medication Sig Dispense Refill  . alum & mag hydroxide-simeth (MAALOX/MYLANTA) 200-200-20 MG/5ML suspension Take 15 mLs by mouth every 6 (six) hours as needed for indigestion or heartburn.    Marland Kitchen amLODipine (NORVASC) 5 MG tablet Take 1 tablet (5 mg  total) by mouth daily. 90 tablet 3  . aspirin EC 81 MG tablet Take 81 mg by mouth daily.    . Azelastine-Fluticasone (DYMISTA) 137-50 MCG/ACT SUSP Place 1 spray into the nose daily as needed (allergies).    . clopidogrel (PLAVIX) 75 MG tablet TAKE 1 TABLET (75 MG TOTAL) BY MOUTH DAILY. 30 tablet 6  . Coenzyme Q10 200 MG capsule Take 200 mg by mouth daily.     . Garlic Oil 1000 MG CAPS Take 1 capsule by mouth daily.      . hydrochlorothiazide (HYDRODIURIL) 25 MG tablet Take 1/2 tablet by mouth daily 15 tablet 1  . isosorbide mononitrate (IMDUR) 60 MG 24 hr tablet Take 1 tablet (60 mg total) by mouth daily. 90 tablet 3  . loratadine (CLARITIN) 10 MG tablet Take 10 mg by mouth daily as needed for rhinitis.     . metoprolol tartrate (LOPRESSOR) 25 MG tablet TAKE 1 TABLET BY MOUTH TWICE A DAY 60 tablet 7  . Multiple Vitamins-Minerals (CENTRUM SILVER) tablet Take 1 tablet by mouth daily.      . nitroGLYCERIN (NITROSTAT) 0.4 MG SL tablet Place 1 tablet (0.4 mg total) under the tongue every 5 (five) minutes as needed for chest pain. 25 tablet 3  . Omega-3 Fatty Acids (FISH OIL) 1000 MG CAPS Take 1 capsule by mouth daily.      . pantoprazole (PROTONIX) 40 MG tablet Take 1 tablet (40 mg total) by mouth daily. 30 tablet 5  . pravastatin (PRAVACHOL) 40 MG tablet TAKE 1 TABLET (40 MG TOTAL) BY MOUTH EVERY EVENING. 90 tablet 3  . ranolazine (RANEXA) 500 MG 12 hr tablet Take 1 tablet (500 mg total) by mouth 2 (two) times daily. 60 tablet 11  . tamsulosin (FLOMAX) 0.4 MG CAPS capsule Take 0.4 mg by mouth daily.    Marland Kitchen telmisartan (MICARDIS) 40 MG tablet Take 1 tablet (40 mg total) by mouth daily. 90 tablet 1   No current facility-administered medications on file prior to visit.     BP (!) 146/70 (BP Location: Right Arm, Patient Position: Sitting, Cuff Size: Normal)   Pulse 84   Temp (!) 100.4 F (38 C) (Oral)   Ht  (1.626 m)   Wt 183 lb 11.2 oz (83.3 kg)   SpO2 95%   BMI 31.53 kg/m         Objective:   Physical Exam  Constitutional: He is oriented to person, place, and time. He appears well-developed and well-nourished.  Eyes: Pupils are equal, round, and reactive to light. No scleral icterus.  Neck: Neck supple.  Cardiovascular: Normal rate, regular rhythm and intact distal pulses.   Murmur heard. Systolic murmur  Pulmonary/Chest: Effort normal and breath sounds normal.  Mild crackles noted in right lower lobe.  Musculoskeletal: He exhibits no edema.  Lymphadenopathy:    He has no cervical adenopathy.  Neurological: He is alert and oriented to person, place, and time.  Patient is drowsy after taking  Nyquil for symptoms  Skin: Skin is warm and dry. No rash noted.  Psychiatric: He has a normal mood and affect. His behavior is normal. Judgment and thought content normal.       Assessment & Plan:  1. Cough Exam and history support empiric treatment for suspected community acquired pneumonia. Fever, age >60, and mild crackles heard in right lower lobe support treatment. Advised patient, wife, and daughter to discontinue use of OTC Nyquil which causes drowsiness and will increase patient's risk of falls. Doxycycline chosen as patient is >65 and is at a higher risk for QT prolongation with his coronary history.  Advised use of OTC delsym for cough and patient can use Allegra or Claritin for symptoms of allergic rhinitis when needed. Patient, wife, and daughter voiced understanding and agreed with plan.  - doxycycline (VIBRA-TABS) 100 MG tablet; Take 1 tablet (100 mg total) by mouth 2 (two) times daily.  Dispense: 20 tablet; Refill: 0  2. Fever, unspecified  Advised follow up early next week with his PCP or sooner if symptoms do not improve, worsen, or he develops a fever >101.  Patient and family agreed.  Roddie Mc, FNP-C.

## 2016-04-13 ENCOUNTER — Telehealth: Payer: Self-pay | Admitting: Internal Medicine

## 2016-04-13 NOTE — Telephone Encounter (Signed)
Called and spoke with Vickki Hearing ( wife) regarding the "flu" like symptoms. She expressed that they are the same symptoms that he was seen in the office for. I explained that the antibiotics prescribed need time to take effect. If he has a temperature of >101 or symptoms worsen to be reevaluated in office per Roddie Mc, FNP

## 2016-04-13 NOTE — Telephone Encounter (Signed)
Pts wife called to let you know that the pt is having flu like symptoms this morning and wanted to know if there is something that can be called in for that.  Pt saw Amil Amen on 8/3

## 2016-04-13 NOTE — Telephone Encounter (Signed)
I called the patient to ask further questions regarding the message of "flu" symptoms and there was not an answer. The information regarding "flu" symptoms in the message is not clear. If he is feeling worse, has a fever >101, or has developed new symptoms he should be reevaluated.

## 2016-04-13 NOTE — Telephone Encounter (Signed)
Please Advise

## 2016-04-16 ENCOUNTER — Ambulatory Visit (INDEPENDENT_AMBULATORY_CARE_PROVIDER_SITE_OTHER): Payer: Medicare Other | Admitting: Internal Medicine

## 2016-04-16 ENCOUNTER — Encounter: Payer: Self-pay | Admitting: Internal Medicine

## 2016-04-16 VITALS — BP 130/62 | HR 57 | Temp 98.1°F | Resp 16 | Ht 64.0 in | Wt 177.8 lb

## 2016-04-16 DIAGNOSIS — J069 Acute upper respiratory infection, unspecified: Secondary | ICD-10-CM

## 2016-04-16 DIAGNOSIS — I10 Essential (primary) hypertension: Secondary | ICD-10-CM | POA: Diagnosis not present

## 2016-04-16 DIAGNOSIS — R350 Frequency of micturition: Secondary | ICD-10-CM | POA: Insufficient documentation

## 2016-04-16 LAB — POC URINALSYSI DIPSTICK (AUTOMATED)
BILIRUBIN UA: POSITIVE
Blood, UA: NEGATIVE
GLUCOSE UA: NEGATIVE
Ketones, UA: NEGATIVE
Leukocytes, UA: NEGATIVE
Nitrite, UA: NEGATIVE
Protein, UA: POSITIVE
SPEC GRAV UA: 1.025
UROBILINOGEN UA: 0.2
pH, UA: 6

## 2016-04-16 NOTE — Patient Instructions (Signed)

## 2016-04-16 NOTE — Progress Notes (Signed)
Subjective:  Patient ID: Ronnie Boyd, male    DOB: [birth year]  Age: 82 y.o. MRN: [id number]  CC: URI   HPI DEZMON CONOVER presents for follow-up on a recent upper respiratory infection. About 10 days ago he developed a cough, sore throat, chills and muscle aches. He was seen elsewhere and given a prescription for doxycycline. A CXR was not done. He feels much better and his only complaint today is muscle aches and frequent urination.  Outpatient Medications Prior to Visit  Medication Sig Dispense Refill  . alum & mag hydroxide-simeth (MAALOX/MYLANTA) 200-200-20 MG/5ML suspension Take 15 mLs by mouth every 6 (six) hours as needed for indigestion or heartburn.    Marland Kitchen amLODipine (NORVASC) 5 MG tablet Take 1 tablet (5 mg total) by mouth daily. 90 tablet 3  . aspirin EC 81 MG tablet Take 81 mg by mouth daily.    . Azelastine-Fluticasone (DYMISTA) 137-50 MCG/ACT SUSP Place 1 spray into the nose daily as needed (allergies).    . clopidogrel (PLAVIX) 75 MG tablet TAKE 1 TABLET (75 MG TOTAL) BY MOUTH DAILY. 30 tablet 6  . Coenzyme Q10 200 MG capsule Take 200 mg by mouth daily.     Marland Kitchen doxycycline (VIBRA-TABS) 100 MG tablet Take 1 tablet (100 mg total) by mouth 2 (two) times daily. 20 tablet 0  . Garlic Oil 1000 MG CAPS Take 1 capsule by mouth daily.      . hydrochlorothiazide (HYDRODIURIL) 25 MG tablet Take 1/2 tablet by mouth daily 15 tablet 1  . isosorbide mononitrate (IMDUR) 60 MG 24 hr tablet Take 1 tablet (60 mg total) by mouth daily. 90 tablet 3  . loratadine (CLARITIN) 10 MG tablet Take 10 mg by mouth daily as needed for rhinitis.     . metoprolol tartrate (LOPRESSOR) 25 MG tablet TAKE 1 TABLET BY MOUTH TWICE A DAY 60 tablet 7  . Multiple Vitamins-Minerals (CENTRUM SILVER) tablet Take 1 tablet by mouth daily.      . nitroGLYCERIN (NITROSTAT) 0.4 MG SL tablet Place 1 tablet (0.4 mg total) under the tongue every 5 (five) minutes as needed for chest pain. 25 tablet 3  . Omega-3 Fatty Acids  (FISH OIL) 1000 MG CAPS Take 1 capsule by mouth daily.      . pantoprazole (PROTONIX) 40 MG tablet Take 1 tablet (40 mg total) by mouth daily. 30 tablet 5  . pravastatin (PRAVACHOL) 40 MG tablet TAKE 1 TABLET (40 MG TOTAL) BY MOUTH EVERY EVENING. 90 tablet 3  . ranolazine (RANEXA) 500 MG 12 hr tablet Take 1 tablet (500 mg total) by mouth 2 (two) times daily. 60 tablet 11  . tamsulosin (FLOMAX) 0.4 MG CAPS capsule Take 0.4 mg by mouth daily.    Marland Kitchen telmisartan (MICARDIS) 40 MG tablet Take 1 tablet (40 mg total) by mouth daily. 90 tablet 1   No facility-administered medications prior to visit.     ROS Review of Systems  Constitutional: Negative.  Negative for activity change, appetite change, chills, fatigue and fever.  HENT: Negative.  Negative for congestion, facial swelling, postnasal drip, sinus pressure, sore throat, trouble swallowing and voice change.   Eyes: Negative.   Respiratory: Negative.  Negative for cough, choking, shortness of breath, wheezing and stridor.   Cardiovascular: Negative.  Negative for chest pain, palpitations and leg swelling.  Gastrointestinal: Negative.  Negative for abdominal pain, blood in stool, constipation, diarrhea, nausea and vomiting.  Endocrine: Negative.  Negative for polydipsia, polyphagia and polyuria.  Genitourinary: Positive  for frequency. Negative for difficulty urinating, dysuria, flank pain, hematuria and urgency.  Musculoskeletal: Positive for myalgias. Negative for arthralgias, back pain, joint swelling and neck pain.  Skin: Negative.  Negative for color change and rash.  Allergic/Immunologic: Negative.   Neurological: Negative.  Negative for dizziness and light-headedness.  Hematological: Negative.  Negative for adenopathy. Does not bruise/bleed easily.  Psychiatric/Behavioral: Negative.     Objective:  BP 130/62 (BP Location: Left Arm, Patient Position: Sitting, Cuff Size: Large)   Pulse (!) 57   Temp 98.1 F (36.7 C) (Oral)   Resp 16    Ht  (1.626 m)   Wt 177 lb 12 oz (80.6 kg)   SpO2 97%   BMI 30.51 kg/m   BP Readings from Last 3 Encounters:  04/16/16 130/62  04/12/16 (!) 146/70  04/10/16 100/60    Wt Readings from Last 3 Encounters:  04/16/16 177 lb 12 oz (80.6 kg)  04/12/16 183 lb 11.2 oz (83.3 kg)  04/10/16 183 lb (83 kg)    Physical Exam  Constitutional: He is oriented to person, place, and time. No distress.  HENT:  Mouth/Throat: Oropharynx is clear and moist. No oropharyngeal exudate.  Eyes: Conjunctivae are normal. Right eye exhibits no discharge. Left eye exhibits no discharge. No scleral icterus.  Neck: Normal range of motion. Neck supple. No JVD present. No tracheal deviation present. No thyromegaly present.  Cardiovascular: Normal rate, regular rhythm, S1 normal, S2 normal and intact distal pulses.  Exam reveals no gallop and no friction rub.   Murmur heard.  Decrescendo systolic murmur is present with a grade of 2/6   No diastolic murmur is present  2/6 SEM over RUSB  Pulmonary/Chest: Effort normal and breath sounds normal. No stridor. No respiratory distress. He has no wheezes. He has no rales. He exhibits no tenderness.  Abdominal: Soft. Bowel sounds are normal. He exhibits no distension and no mass. There is no tenderness. There is no rebound and no guarding.  Musculoskeletal: Normal range of motion. He exhibits no edema, tenderness or deformity.  Lymphadenopathy:    He has no cervical adenopathy.  Neurological: He is oriented to person, place, and time.  Skin: Skin is warm and dry. No rash noted. He is not diaphoretic. No erythema. No pallor.  Vitals reviewed.   Lab Results  Component Value Date   WBC 10.5 04/10/2016   HGB 12.7 (L) 04/10/2016   HCT 38.4 (L) 04/10/2016   PLT 163 04/10/2016   GLUCOSE 106 (H) 04/10/2016   CHOL 180 11/09/2015   TRIG 37.0 11/09/2015   HDL 85.00 11/09/2015   LDLDIRECT 131.5 11/06/2011   LDLCALC 88 11/09/2015   ALT 14 11/09/2015   AST 19  11/09/2015   NA 134 (L) 04/10/2016   K 4.1 04/10/2016   CL 100 04/10/2016   CREATININE 1.35 (H) 04/10/2016   BUN 17 04/10/2016   CO2 21 04/10/2016   TSH 0.60 04/10/2016   PSA 0.76 11/23/2010   INR 1.22 11/08/2015   HGBA1C 6.8 (H) 11/09/2015    Dg Chest Portable 1 View  Result Date: 11/11/2015 CLINICAL DATA:  Acute onset of right shoulder pain radiating to the jaw. Diaphoresis. Initial encounter. EXAM: PORTABLE CHEST 1 VIEW COMPARISON:  Chest radiograph from 11/29/2008 FINDINGS: The lungs are well-aerated and clear. There is no evidence of focal opacification, pleural effusion or pneumothorax. The cardiomediastinal silhouette is borderline enlarged. No acute osseous abnormalities are seen. IMPRESSION: Borderline cardiomegaly.  Lungs remain grossly clear. Electronically Signed   By:  Roanna RaiderJeffery  Chang M.D.   On: 11/11/2015 06:27    Assessment & Plan:   [name] was seen today for uri.  Diagnoses and all orders for this visit:  Urinary frequency- his urinalysis is normal, this is caused by the thiazide diuretic -     POCT Urinalysis Dipstick (Automated)  Essential hypertension- his blood pressure is well-controlled  URI, acute- this is quickly resolving with doxycycline, no additional treatment needed at this time.   I am having [name] maintain his CENTRUM SILVER, Fish Oil, Garlic Oil, Coenzyme Q10, loratadine, alum & mag hydroxide-simeth, aspirin EC, Azelastine-Fluticasone, tamsulosin, nitroGLYCERIN, isosorbide mononitrate, pravastatin, pantoprazole, ranolazine, telmisartan, clopidogrel, hydrochlorothiazide, metoprolol tartrate, amLODipine, and doxycycline.  No orders of the defined types were placed in this encounter.    Follow-up: Return if symptoms worsen or fail to improve.  Sanda Lingerhomas Treva Huyett, MD

## 2016-04-16 NOTE — Progress Notes (Signed)
Pre visit review using our clinic review tool, if applicable. No additional management support is needed unless otherwise documented below in the visit note. 

## 2016-04-18 ENCOUNTER — Ambulatory Visit (INDEPENDENT_AMBULATORY_CARE_PROVIDER_SITE_OTHER): Payer: Medicare Other | Admitting: Nurse Practitioner

## 2016-04-18 ENCOUNTER — Encounter: Payer: Self-pay | Admitting: Nurse Practitioner

## 2016-04-18 ENCOUNTER — Ambulatory Visit: Payer: Medicare Other | Admitting: Neurology

## 2016-04-18 VITALS — BP 156/70 | HR 56 | Resp 20 | Ht 64.0 in | Wt 176.4 lb

## 2016-04-18 DIAGNOSIS — E7800 Pure hypercholesterolemia, unspecified: Secondary | ICD-10-CM

## 2016-04-18 DIAGNOSIS — Z9989 Dependence on other enabling machines and devices: Secondary | ICD-10-CM

## 2016-04-18 DIAGNOSIS — I10 Essential (primary) hypertension: Secondary | ICD-10-CM

## 2016-04-18 DIAGNOSIS — G4733 Obstructive sleep apnea (adult) (pediatric): Secondary | ICD-10-CM

## 2016-04-18 NOTE — Patient Instructions (Addendum)
Continue CPAP check with sleep lab on the way out for mask fitting Continue aspirin and Plavix for secondary stroke prevention Keep blood pressure 130/90 or less today's reading 156/70, continue blood pressure medicines Continue Pravachol lipids followed by primary care Follow-up for CPAP reading in 6 months

## 2016-04-18 NOTE — Progress Notes (Signed)
[name]  PATIENT: Ronnie Boyd DOB: [birth year]   REASON FOR VISIT: Follow-up for obstructive sleep apnea on CPAP, history of CVA HISTORY FROM: Patient    HISTORY OF PRESENT ILLNESS:UPDATE 8/9/17CM Ronnie Boyd, 82 year old male returns for follow-up. MRI of the brain showed  a small acute right medial thalamus midbrain lacunar infarct 03/04/2015. Carotid ultrasound negative 2-D echo normal. He is currently on Plavix and aspirin without recurrent stroke or TIA symptoms. He also has history of obstructive sleep apnea and uses CPAP. He states he's had some respiratory issues recently and has been unable to use his mask. He also wants to get a mask fitting today. CPAP compliance is 69% for 62-90 days. Average usage of 5 hours 59 minutes. Minimum pressure 5 maximum pressure 10 cm. EPR level 3. AHI 0.5. He returns for reevaluation 07/20/15 [name] is a 82 y.o. male  Is seen here as a referral  from Dr. Merla Boyd and Dr. Pearlean Boyd for a sleep evaluation. Ronnie Boyd was seen during his hospitalization by my colleague, Dr. Delia Boyd.  He had presented with a recent stroke that affected the thalamic region. One of his symptoms was leaning towards the left and weaker side. Doctor Ronnie Boyd  had added a baby aspirin to Plavix 75 mg daily , and had explained the goals of control of hypertension blood pressure and cholesterol. The patient was advised that he should partake in regular exercise, moderate diet.  The patient noted that he likes to walk but that he developed cramps in his thighs and both lower extremities , which he attributes to his cholesterol-lowering medication.  Change from Lipitor to Pravachol followed recently. This caused him to have myositis symptoms.  MRI scan of the brain on 6-20 4-16 which showed a small acute right medial thalamic and midbrain infarct. This was measured to be a local. The MRI of the brain showed no large vessel stenosis and carotid ultrasound  from 6-20 9-16 reviewed by Dr. Marlis Boyd patient showed no significant stenosis in the extracranial parts. He had a transthoracic cardiac echo done which was normal.  Nuclear medicine scan was normal and he is therefore scheduled to undergo cardiac catheterization with Dr. Marca Boyd.   The patient reports that he has no trouble falling asleep when watching TV in his living room. He is has an easier time sleeping in a seated position or reclined position.  When he transfers to the bedroom he struggles to find a comfortable sleep position and to go to sleep.  He describes his marital bedroom is cool, quiet and dark. He sleeps on his back on multiple pillows . The patient has to go to the bathroom at least 2-3 times at night and he is taking diabetic medications. Usually can reinitiate sleep quickly after bathroom break. He rises in the morning at about 6:30 AM spontaneously without alarm. He does not describe any morning headaches, he wakes up with a dry mouth. His wife states usually in bed he often returns to the bedroom a nap for another hour. Does not drink any caffeinated beverages. He is retired from his career job but he does keep his  hobby of lawn and garden care.He takes naps, after lunch, for 1 hour . He can easily fall asleep in daytime. He takes an after dinner nap daily. Total sleep time over 24 hours is about 8 hours. He no history of ENT, neck of facial surgery.  Denies falling asleep driving.  He feels back to  baseline.  Interval history, dated 05-23-15  Ronnie Boyd underwent a sleep study was split night protocol on 04-19-15 . He  had endorsed the Epworth sleepiness score in the sleep lab at only 3 points but previously in the office endorsed 11 points.  He was only asleep for about 50% of the recorded time for the diagnostic part of the study.  He was diagnosed with moderate to severe apnea at an AHI of 28.6 and severe upper airway restriction with an RDI of 55.6. In supine sleep his  AHI was 30.4 and unfortunately REM sleep was not recorded. His heart rate varied between 42 and 55. The patient was titrated to CPAP and the AHI became 0.0 under 9 cm water pressure. Now he was able to reach REM sleep AHI in rem sleep became 0 as well. Recommended to start the patient on CPAP with a 9 cm water pressure setting and a nasal mask. Heated humidity should be added. We are here today to discuss the results of the recent study. He has not started CPAP.Marland Kitchen. His Epworth sleepiness score today is at 10 points. Ronnie Boyd reports that he is able to dream, and usually feels refreshed and restored in the morning when waking up. He was advised of the degree of apnea, the association with bradycardia, he did not have significant oxygen drops. My recommendation would still be a CPAP given that his RDI was excessively high in conjunction with a moderate degree of apnea. I also explained that the dental device could not be used in patients that have this degree of apnea.  07-20-15 Ronnie Boyd is here following up on his recent sleep study from 04-19-15. He is a meanwhile 82 year old gentleman who does not look his age. And he underwent a split-night polysomnography the baseline or diagnostic part revealed an AHI of 28.6 and an RDI of 55.6. Supine sleep accentuated the apnea further. He also had no periodic limb movements and frequent respiratory related arousals from snoring or struggling to get air. His heart rate was very low on average 42 bpm. CPAP was initiated at 5 and titrated to 10 cm water pressure. At 9 cm water the AHI became 0.0. The recommended to start on an AutoSet. This would treat the upper airway resistance as well as the apnea.  I'm also able today to review a download and compliance data. Dated 07-18-15 the patient was 100% compliance for the last 30 days and 90% compliance for over 4 hours of consecutive use. The average time using his CPAP nightly 6 hours and 59 minutes. His AHI is 3.2 the pressure  window is between 5 and 10 cm water with a 3 cm EPR. At this setting he is excellently treated and I would not want to adjust or define 1 setting but leave him on his current machine the patient reports that the tears airflow is sometimes too strong. He would like to have a RAMP time set for about 4 minutes.   REVIEW OF SYSTEMS: Full 14 system review of systems performed and notable only for those listed, all others are neg:  Constitutional: neg  Cardiovascular: neg Ear/Nose/Throat: neg  Skin: neg Eyes: neg Respiratory: Cough wheezing Gastroitestinal: Urinary frequency Hematology/Lymphatic: neg  Endocrine: neg Musculoskeletal:neg Allergy/Immunology: neg Neurological: neg Psychiatric: neg Sleep : Obstructive sleep apnea with CPAP   ALLERGIES: No Known Allergies  HOME MEDICATIONS: Outpatient Medications Prior to Visit  Medication Sig Dispense Refill  . alum & mag hydroxide-simeth (MAALOX/MYLANTA) 200-200-20 MG/5ML suspension Take 15  mLs by mouth every 6 (six) hours as needed for indigestion or heartburn.    Marland Kitchen amLODipine (NORVASC) 5 MG tablet Take 1 tablet (5 mg total) by mouth daily. 90 tablet 3  . aspirin EC 81 MG tablet Take 81 mg by mouth daily.    . Azelastine-Fluticasone (DYMISTA) 137-50 MCG/ACT SUSP Place 1 spray into the nose daily as needed (allergies).    . clopidogrel (PLAVIX) 75 MG tablet TAKE 1 TABLET (75 MG TOTAL) BY MOUTH DAILY. 30 tablet 6  . Coenzyme Q10 200 MG capsule Take 200 mg by mouth daily.     Marland Kitchen doxycycline (VIBRA-TABS) 100 MG tablet Take 1 tablet (100 mg total) by mouth 2 (two) times daily. 20 tablet 0  . Garlic Oil 1000 MG CAPS Take 1 capsule by mouth daily.      . hydrochlorothiazide (HYDRODIURIL) 25 MG tablet Take 1/2 tablet by mouth daily 15 tablet 1  . isosorbide mononitrate (IMDUR) 60 MG 24 hr tablet Take 1 tablet (60 mg total) by mouth daily. 90 tablet 3  . loratadine (CLARITIN) 10 MG tablet Take 10 mg by mouth daily as needed for rhinitis.     .  metoprolol tartrate (LOPRESSOR) 25 MG tablet TAKE 1 TABLET BY MOUTH TWICE A DAY 60 tablet 7  . Multiple Vitamins-Minerals (CENTRUM SILVER) tablet Take 1 tablet by mouth daily.      . nitroGLYCERIN (NITROSTAT) 0.4 MG SL tablet Place 1 tablet (0.4 mg total) under the tongue every 5 (five) minutes as needed for chest pain. 25 tablet 3  . Omega-3 Fatty Acids (FISH OIL) 1000 MG CAPS Take 1 capsule by mouth daily.      . pantoprazole (PROTONIX) 40 MG tablet Take 1 tablet (40 mg total) by mouth daily. 30 tablet 5  . pravastatin (PRAVACHOL) 40 MG tablet TAKE 1 TABLET (40 MG TOTAL) BY MOUTH EVERY EVENING. 90 tablet 3  . ranolazine (RANEXA) 500 MG 12 hr tablet Take 1 tablet (500 mg total) by mouth 2 (two) times daily. 60 tablet 11  . tamsulosin (FLOMAX) 0.4 MG CAPS capsule Take 0.4 mg by mouth daily.    Marland Kitchen telmisartan (MICARDIS) 40 MG tablet Take 1 tablet (40 mg total) by mouth daily. 90 tablet 1   No facility-administered medications prior to visit.     PAST MEDICAL HISTORY: Past Medical History:  Diagnosis Date  . Allergic rhinitis   . Aortic stenosis    mild echo 7/14  . Borderline type 2 diabetes mellitus   . Coronary artery disease   . Degenerative joint disease   . Family history of adverse reaction to anesthesia    "daughter had problems when she was a teen; none since; it was related to asthma"  . GERD (gastroesophageal reflux disease)   . Hemorrhoids   . History of hiatal hernia    "dx'd years ago" (06/01/2015)  . Hypercholesteremia   . Hypertension   . OSA on CPAP since 05/23/2015  . Prostatitis    hx of  . PVD (peripheral vascular disease) (HCC)   . Stroke [facility])     PAST SURGICAL HISTORY: Past Surgical History:  Procedure Laterality Date  . CARDIAC CATHETERIZATION [name] 03/18/2015   Procedure: Left Heart Cath and Coronary Angiography;  Surgeon: Laurey Morale, MD;  Location: [facility] INVASIVE CV LAB;  Service: Cardiovascular;  Laterality: [name];  . CARDIAC CATHETERIZATION [name] 06/01/2015     Procedure: Coronary Stent Intervention;  Surgeon: Iran Ouch, MD;  Location: [facility] INVASIVE CV LAB;  Service: Cardiovascular;  Laterality: [name];  . CARDIAC CATHETERIZATION  11/11/2015  . CARDIAC CATHETERIZATION [name] 11/11/2015   Procedure: Intravascular Pressure Wire/FFR Study;  Surgeon: Tonny Bollman, MD;  Location[facility] INV[facility] CV LAB;  Service: Cardiovascular;  Laterality: [name];  . CARPAL TUNNEL RELEASE Bilateral 2006-2007  . CATARACT EXTRACTION W/ INTRAOCULAR LENS  IMPLANT, BILATERAL Bilateral ~ 2003  . EXCISIONAL HEMORRHOIDECTOMY  1989    FAMILY HISTORY: Family History  Problem Relation Age of Onset  . Hypertension Mother   . Diabetes Mother 18  . Stroke Father 69  . Arthritis    . Diabetes Brother     SOCIAL HISTORY: Social History   Social History  . Marital status: Married    Spouse name: [name]  . Number of children: [name]  . Years of education: [name]   Occupational History  . retired    Social History Main Topics  . Smoking status: Never Smoker  . Smokeless tobacco: Never Used  . Alcohol use No  . Drug use: No  . Sexual activity: Not Currently   Other Topics Concern  . Not on file   Social History Narrative   Daughter is [name] Psychologist, sport and exercise at [employer])     PHYSICAL EXAM  Vitals:   04/18/16 1112  BP: (!) 156/70  Pulse: (!) 56  Resp: 20  Weight: 176 lb 6.4 oz (80 kg)  Height: 5\' 4"  (1.626 m)   Body mass index is 30.28 kg/m. General: Mildly obese elderly African-American male, seated, in no evident distress ESS 3 Head: head normocephalic and atraumatic.   Neck: supple with no carotid or supraclavicular bruits Cardiovascular: regular rate and rhythm,  systolic murmur heard all over the precordium Musculoskeletal: no deformity Skin:  no rash/petichiae Vascular:  Normal pulses all extremities    Neurological examination  Mental Status: Awake and fully alert. Oriented to place and time. Recent and remote memory intact. Attention span,  concentration and fund of knowledge appropriate. Mood and affect appropriate.  Cranial Nerves: Fundoscopic exam not done . Pupils equal, briskly reactive to light. Extraocular movements full without nystagmus. There is no eyelid drooping.Visual fields full to confrontation. Hearing intact. Facial sensation intact. Face, tongue, palate moves normally and symmetrically.  Motor: Normal bulk and tone. Normal strength in all tested extremity muscles. Sensory.: intact to touch , pinprick , position and vibratory sensation.  Coordination: Rapid alternating movements normal in all extremities. Finger-to-nose and heel-to-shin performed accurately bilaterally. Gait and Station: Arises from chair without difficulty. Stance is normal. Gait demonstrates normal stride length and balance . Able to heel, toe and tandem walk without difficulty.  Reflexes: 1+ and symmetric. Toes downgoing.  DIAGNOSTIC DATA (LABS, IMAGING, TESTING) - I reviewed patient records, labs, notes, testing and imaging myself where available.  Lab Results  Component Value Date   WBC 10.5 04/10/2016   HGB 12.7 (L) 04/10/2016   HCT 38.4 (L) 04/10/2016   MCV 81.4 04/10/2016   PLT 163 04/10/2016      Component Value Date/Time   NA 134 (L) 04/10/2016 1618   K 4.1 04/10/2016 1618   CL 100 04/10/2016 1618   CO2 21 04/10/2016 1618   GLUCOSE 106 (H) 04/10/2016 1618   BUN 17 04/10/2016 1618   CREATININE 1.35 (H) 04/10/2016 1618   CALCIUM 8.7 04/10/2016 1618   PROT 7.6 11/09/2015 1042   ALBUMIN 4.2 11/09/2015 1042   AST 19 11/09/2015 1042   ALT 14 11/09/2015 1042   ALKPHOS 43 11/09/2015 1042   BILITOT  0.6 11/09/2015 1042   GFRNONAA 43 (L) 11/12/2015 0453   GFRAA 50 (L) 11/12/2015 0453   Lab Results  Component Value Date   CHOL 180 11/09/2015   HDL 85.00 11/09/2015   LDLCALC 88 11/09/2015   LDLDIRECT 131.5 11/06/2011   TRIG 37.0 11/09/2015   CHOLHDL 2 11/09/2015   Lab Results  Component Value Date   HGBA1C 6.8 (H) 11/09/2015    No results found for: [id number] Lab Results  Component Value Date   TSH 0.60 04/10/2016   Assessment: 82 year old African-American male with transient episode of excessive daytime sleepiness and balance difficulties due to right thalmic brain lacunar infarct likely from small vessel disease with risk factors of hypertension hyperlipidemia obesity age and sleep apnea on CPAP.CPAP compliance is 69% for 62-90 days. Average usage of 5 hours 59 minutes. Minimum pressure 5 maximum pressure 10 cm. EPR level 3. AHI 0.5 He has been treated for 2 respiratory infections.    PLAN: Continue CPAP at current settings  check with sleep lab on the way out for mask fitting Continue aspirin and Plavix for secondary stroke prevention Keep blood pressure 130/90 or less today's reading 156/70, continue blood pressure medicines Continue Pravachol lipids followed by primary care Exercises by walking for overall health and well-being, healthy diet fresh fruits and vegetables whole grains Follow-up for CPAP reading in 6 months Nilda Riggs, [facility], [facility], APRN  [facility] [address] [place], [place] [address] [phone]

## 2016-04-19 NOTE — Progress Notes (Signed)
I agree with the assessment and plan as directed by NP .The patient is known to me .   Pattricia Weiher, MD  

## 2016-05-01 ENCOUNTER — Ambulatory Visit (INDEPENDENT_AMBULATORY_CARE_PROVIDER_SITE_OTHER): Payer: Medicare Other | Admitting: Internal Medicine

## 2016-05-01 ENCOUNTER — Encounter: Payer: Self-pay | Admitting: Internal Medicine

## 2016-05-01 VITALS — BP 136/66 | HR 73 | Temp 98.0°F | Resp 16 | Wt 179.0 lb

## 2016-05-01 DIAGNOSIS — R739 Hyperglycemia, unspecified: Secondary | ICD-10-CM | POA: Diagnosis not present

## 2016-05-01 DIAGNOSIS — I350 Nonrheumatic aortic (valve) stenosis: Secondary | ICD-10-CM

## 2016-05-01 DIAGNOSIS — E7800 Pure hypercholesterolemia, unspecified: Secondary | ICD-10-CM | POA: Diagnosis not present

## 2016-05-01 DIAGNOSIS — Z8673 Personal history of transient ischemic attack (TIA), and cerebral infarction without residual deficits: Secondary | ICD-10-CM | POA: Diagnosis not present

## 2016-05-01 DIAGNOSIS — I739 Peripheral vascular disease, unspecified: Secondary | ICD-10-CM

## 2016-05-01 DIAGNOSIS — E1159 Type 2 diabetes mellitus with other circulatory complications: Secondary | ICD-10-CM

## 2016-05-01 DIAGNOSIS — K219 Gastro-esophageal reflux disease without esophagitis: Secondary | ICD-10-CM

## 2016-05-01 DIAGNOSIS — I6930 Unspecified sequelae of cerebral infarction: Secondary | ICD-10-CM

## 2016-05-01 DIAGNOSIS — I2510 Atherosclerotic heart disease of native coronary artery without angina pectoris: Secondary | ICD-10-CM

## 2016-05-01 DIAGNOSIS — I10 Essential (primary) hypertension: Secondary | ICD-10-CM

## 2016-05-01 NOTE — Progress Notes (Signed)
Subjective:    Patient ID: Ronnie Boyd, male    DOB: [birth year], 82 y.o.   MRN: [id number]  HPI The patient is here for follow up.  URI: He took an antibiotic earlier this month for a possible URI.  He thinks the antibiotic did help.  He feels better, but not 100%.  He feels weak and has decreased stamina. He is concerned that it is taking insulin pump to recover. He is currently not exercising regularly. For the past 2 weeks he has only been eating chicken soup.   Hyperlipidemia: He is taking his medication daily. He is compliant with a low fat/cholesterol diet. He is not exercising regularly. He denies myalgias.   Hypertension: He is taking his medication daily. He is compliant with a low sodium diet.  He has chest pain, palpitations and shortness of breath with strenuous exercise. Cardiology is aware of this. He denies any headaches or lightheadedness on a regular basis. He is currently not exercising regularly.    CAD, moderate AS:  He has had stenting of three vessels in 2017.  He is following with cardiology.  He is taking all of his medication daily. He wonders if any of the medication may be causing him to feel more tired. He felt this way prior to his recent upper respiratory infection, but the infection may get worse. He is currently not exercising regularly.  GERD:  He is taking his medication daily as prescribed.  He denies any GERD symptoms and feels his GERD is well controlled.   Diabetes, diet controlled: He is compliant with a diabetic diet. He is not currently exercising regularly.  He is up-to-date with an ophthalmology examination.    Medications and allergies reviewed with patient and updated if appropriate.  Patient Active Problem List   Diagnosis Date Noted  . Urinary frequency 04/16/2016  . Coronary artery disease involving native coronary artery   . Diabetes (HCC) 11/09/2015  . OSA on CPAP 07/20/2015  . History of stroke, lacunar 2016 03/24/2015  .  Unspecified sinusitis (chronic) 07/07/2013  . Aortic stenosis, moderate 2017 01/08/2011  . HYPERCHOLESTEROLEMIA 12/01/2007  . Essential hypertension 12/01/2007  . Peripheral vascular disease (HCC) 12/01/2007  . HEMORRHOIDS 12/01/2007  . ALLERGIC RHINITIS 12/01/2007  . GERD 12/01/2007  . DEGENERATIVE JOINT DISEASE 12/01/2007    Current Outpatient Prescriptions on File Prior to Visit  Medication Sig Dispense Refill  . alum & mag hydroxide-simeth (MAALOX/MYLANTA) 200-200-20 MG/5ML suspension Take 15 mLs by mouth every 6 (six) hours as needed for indigestion or heartburn.    Marland Kitchen. amLODipine (NORVASC) 5 MG tablet Take 1 tablet (5 mg total) by mouth daily. 90 tablet 3  . aspirin EC 81 MG tablet Take 81 mg by mouth daily.    . Azelastine-Fluticasone (DYMISTA) 137-50 MCG/ACT SUSP Place 1 spray into the nose daily as needed (allergies).    . clopidogrel (PLAVIX) 75 MG tablet TAKE 1 TABLET (75 MG TOTAL) BY MOUTH DAILY. 30 tablet 6  . Coenzyme Q10 200 MG capsule Take 200 mg by mouth daily.     . Garlic Oil 1000 MG CAPS Take 1 capsule by mouth daily.      . hydrochlorothiazide (HYDRODIURIL) 25 MG tablet Take 1/2 tablet by mouth daily 15 tablet 1  . isosorbide mononitrate (IMDUR) 60 MG 24 hr tablet Take 1 tablet (60 mg total) by mouth daily. 90 tablet 3  . loratadine (CLARITIN) 10 MG tablet Take 10 mg by mouth daily as needed for rhinitis.     .Marland Kitchen  metoprolol tartrate (LOPRESSOR) 25 MG tablet TAKE 1 TABLET BY MOUTH TWICE A DAY 60 tablet 7  . Multiple Vitamins-Minerals (CENTRUM SILVER) tablet Take 1 tablet by mouth daily.      . nitroGLYCERIN (NITROSTAT) 0.4 MG SL tablet Place 1 tablet (0.4 mg total) under the tongue every 5 (five) minutes as needed for chest pain. 25 tablet 3  . Omega-3 Fatty Acids (FISH OIL) 1000 MG CAPS Take 1 capsule by mouth daily.      . pantoprazole (PROTONIX) 40 MG tablet Take 1 tablet (40 mg total) by mouth daily. 30 tablet 5  . pravastatin (PRAVACHOL) 40 MG tablet TAKE 1 TABLET (40  MG TOTAL) BY MOUTH EVERY EVENING. 90 tablet 3  . ranolazine (RANEXA) 500 MG 12 hr tablet Take 1 tablet (500 mg total) by mouth 2 (two) times daily. 60 tablet 11  . tamsulosin (FLOMAX) 0.4 MG CAPS capsule Take 0.4 mg by mouth daily.    Marland Kitchen. telmisartan (MICARDIS) 40 MG tablet Take 1 tablet (40 mg total) by mouth daily. 90 tablet 1   No current facility-administered medications on file prior to visit.     Past Medical History:  Diagnosis Date  . Allergic rhinitis   . Aortic stenosis    mild echo 7/14  . Borderline type 2 diabetes mellitus   . Coronary artery disease   . Degenerative joint disease   . Family history of adverse reaction to anesthesia    "daughter had problems when she was a teen; none since; it was related to asthma"  . GERD (gastroesophageal reflux disease)   . Hemorrhoids   . History of hiatal hernia    "dx'd years ago" (06/01/2015)  . Hypercholesteremia   . Hypertension   . OSA on CPAP since 05/23/2015  . Prostatitis    hx of  . PVD (peripheral vascular disease) (HCC)   . Stroke [facility](HCC)     Past Surgical History:  Procedure Laterality Date  . CARDIAC CATHETERIZATION [name] 03/18/2015   Procedure: Left Heart Cath and Coronary Angiography;  Surgeon: Laurey Moralealton S McLean, MD;  [facility];  Service: Cardiovascular;  Laterality: [name];  . CARDIAC CATHETERIZATION [name] 06/01/2015   Procedure: Coronary Stent Intervention;  Surgeon: Iran OuchMuhammad A Arida, MD;  [facility]: [facility] CV LAB;  Service: Cardiovascular;  Laterality: [name];  . CARDIAC CATHETERIZATION  11/11/2015  . CARDIAC CATHETERIZATION [name] 11/11/2015   Procedure: Intravascular Pressure Wire/FFR Study;  Surgeon: Tonny BollmanMichael Cooper, MD;  [facility];  Service: Cardiovascular;  Laterality: [name];  . CARPAL TUNNEL RELEASE Bilateral 2006-2007  . CATARACT EXTRACTION W/ INTRAOCULAR LENS  IMPLANT, BILATERAL Bilateral ~ 2003  . EXCISIONAL HEMORRHOIDECTOMY  1989    Social History   Social History  . Marital  status: Married    Spouse name: [name]  . Number of children: [name]  . Years of education: [name]   Occupational History  . retired    Social History Main Topics  . Smoking status: Never Smoker  . Smokeless tobacco: Never Used  . Alcohol use No  . Drug use: No  . Sexual activity: Not Currently   Other Topics Concern  . None   Social History Narrative   Daughter is Mrs. Greer PickerelMcMillan Psychologist, sport and exercise(ACES director at [employer])    Family History  Problem Relation Age of Onset  . Hypertension Mother   . Diabetes Mother 8081  . Stroke Father 5041  . Arthritis    . Diabetes Brother     Review of  Systems  Constitutional: Positive for fatigue (low energy level). Negative for appetite change, chills and fever.  Respiratory: Positive for shortness of breath (only with strenuous exercise). Negative for cough and wheezing.   Cardiovascular: Positive for chest pain (only with strenous exercise), palpitations (only with strenuous exercise) and leg swelling.  Gastrointestinal: Negative for abdominal pain.       GERD controlled  Neurological: Negative for light-headedness and headaches.       Objective:   Vitals:   05/01/16 1051  BP: 136/66  Pulse: 73  Resp: 16  Temp: 98 F (36.7 C)   Filed Weights   05/01/16 1051  Weight: 179 lb (81.2 kg)   Body mass index is 30.73 kg/m.   Physical Exam    Constitutional: Appears well-developed and well-nourished. No distress.  HENT:  Head: Normocephalic and atraumatic.  Neck: Neck supple. No tracheal deviation present. No thyromegaly present.  Cardiovascular: Normal rate, regular rhythm and normal heart sounds.   3/6 systolic murmur heard. No carotid bruit  Pulmonary/Chest: Effort normal and breath sounds normal. No respiratory distress. No has no wheezes. No rales.  Musculoskeletal: mild LE edema.  Lymphadenopathy: No cervical adenopathy.  Skin: Skin is warm and dry. Not diaphoretic.  Psychiatric: Normal mood and affect. Behavior is normal.      Assessment & Plan:    See Problem List for Assessment and Plan of chronic medical problems.

## 2016-05-01 NOTE — Progress Notes (Signed)
Pre visit review using our clinic review tool, if applicable. No additional management support is needed unless otherwise documented below in the visit note. 

## 2016-05-01 NOTE — Assessment & Plan Note (Signed)
GERD controlled Continue daily medication  

## 2016-05-01 NOTE — Assessment & Plan Note (Signed)
Following with neurology No residual deficits

## 2016-05-01 NOTE — Patient Instructions (Addendum)
   All other Health Maintenance issues reviewed.   All recommended immunizations and age-appropriate screenings are up-to-date or discussed.  No immunizations administered today.   Medications reviewed and updated.  No changes recommended at this time.    Please followup in 6 months   

## 2016-05-10 NOTE — Progress Notes (Signed)
Cardiology Office Note:    Date:  05/11/2016   ID:  Ronnie Boyd, DOB 10/27/1926, MRN [id number]  PCP:  Pincus Sanes, MD  Cardiologist:  Dr. Marca Ancona   PV:  Dr. Lorine Bears   Referring MD: Pincus Sanes, MD   Chief Complaint  Patient presents with  . Follow-up    Fatigue    History of Present Illness:    Ronnie Boyd is a 82 y.o. male with a hx of HTN, diet-controlled diabetes, hyperlipidemia, moderate aortic stenosis, CVA, CAD, and PAD. Patient was admitted in 6/16 with small right medial thalamic/mid brain lacunar infarct manifesting with balance difficulty and slight drooping of the right eyelid. Symptoms completely resolved with no residual defect. He had a carotid US in 6/16 without significant stenosis.   Echo in 6/16 demonstrated normal EF and moderate AS in 6/16. LHC in 7/16 demonstrated significant 3 vessel disease. It was felt that CABG should be delayed 3 months after CVA. Ultimately, it was elected to treat him percutaneously. He had Synergy DES biodegradable stents to the mLAD, dLAD, and OM1.   LHC in 2/17 demonstrated continued patency of the stented segments in the mid and distal LAD, continued patency of the stented segment in the RI, severe stenosis of a small nondominant RCA and moderate proximal LAD stenosis. The proximal LAD stenosis was negative by pressure wire analysis. Med Rx was rec for severe diffuse small vessel CAD.  I saw him 04/10/16 for fatigue.  BP was running low.  I reduced his Amlodipine.  He was then seen by his PCP and he was placed on antibiotics for a cough.  He returns for FU.  He is doing much better. His fatigue is improved.  He is short of breath with mod activities.  He denies chest pain, orthopnea, PND, edema.  He denies syncope.   Prior CV studies that were reviewed today include:    Echo 11/12/15 Vigorous LVF, EF 65-70%, normal wall motion, moderate aortic stenosis, mean gradient 28 mmHg/peak 52 mmHg, mild AI  LHC  11/11/15 LAD proximal 60% - FFR 0.91 >> 0.85 at peak hyperemia, mid stent patent, distal stent patent RI stent patent, then 90%, lateral ramus 99% with inferior subbranch 90% LCx ostial 40%, L PDA 90% distal-2 small for PCI RCA proximal 90%-unchanged. 1. Continued patency of the stented segments in the mid- and distal-LAD 2. Continued patency of the stented segment in the ramus intermedius 3. Severe stenosis of a small nondominant RCA 4. Moderate proximal LAD stenosis with negative pressure wire analysis 5. Mild LCx stenosis 6. Severe diffuse small vessel CAD appropriate for medical therapy   PCI 06/01/15 2.75 x 16 mm Synergy DES to mid LAD 2.25 x 20 mm Synergy DES to distal LAD 2.25 x 20 mm Synergy DES to OM1 LCx prox 60% - treated medically  LHC 03/18/15 LAD prox 60%, mid 80%, distal 80-90% LCx prox 70+%, high OM proximal 90% RCA ostial 80%, prox 95%, mid 90%  Echo 6/16 Mild LVH, vigorous LV function, EF 65-70%, normal wall motion, moderate aortic stenosis (mean gradient 28 mmHg, peak 46 mmHg), mild AI  Carotid US 6/16 Bilateral ICA 1-49%  Myoview 6/16 Intermediate risk lexiscan nuclear stress test demonstrating a medium in size, moderate in intensity defect involving the apical lateral and mid-distal lateral-anterolateral wall (extent 22%) consistent with ischemia.The patient had baseline inferolateral T wave inversion and developed 1 mm downsloping ST depression inferiorly and had occasional to frequent PVC's during stress and  in recovery. Global EF is preserved but there is mild distal lateral milf hypocontactility. Clinical correlation is recommended.  Past Medical History:  Diagnosis Date  . Allergic rhinitis   . Aortic stenosis    mild echo 7/14  . Borderline type 2 diabetes mellitus   . Coronary artery disease   . Degenerative joint disease   . Family history of adverse reaction to anesthesia    "daughter had problems when she was a teen; none since; it was related  to asthma"  . GERD (gastroesophageal reflux disease)   . Hemorrhoids   . History of hiatal hernia    "dx'd years ago" (06/01/2015)  . Hypercholesteremia   . Hypertension   . OSA on CPAP since 05/23/2015  . Prostatitis    hx of  . PVD (peripheral vascular disease) (HCC)   . Stroke [facility]HCC)     Past Surgical History:  Procedure Laterality Date  . CARDIAC CATHETERIZATION [name] 03/18/2015   Procedure: Left Heart Cath and Coronary Angiography;  Surgeon: Laurey Moralealton S McLean, MD;  [facility];  Service: Cardiovascular;  Laterality: [name];  . CARDIAC CATHETERIZATION [name] 06/01/2015   Procedure: Coronary Stent Intervention;  Surgeon: Iran OuchMuhammad A Arida, MD;  [facility]: [facility] CV LAB;  Service: Cardiovascular;  Laterality: [name];  . CARDIAC CATHETERIZATION  11/11/2015  . CARDIAC CATHETERIZATION [name] 11/11/2015   Procedure: Intravascular Pressure Wire/FFR Study;  Surgeon: Tonny BollmanMichael Cooper, MD;  [facility];  Service: Cardiovascular;  Laterality: [name];  . CARPAL TUNNEL RELEASE Bilateral 2006-2007  . CATARACT EXTRACTION W/ INTRAOCULAR LENS  IMPLANT, BILATERAL Bilateral ~ 2003  . EXCISIONAL HEMORRHOIDECTOMY  1989    Current Medications: Outpatient Medications Prior to Visit  Medication Sig Dispense Refill  . alum & mag hydroxide-simeth (MAALOX/MYLANTA) 200-200-20 MG/5ML suspension Take 15 mLs by mouth every 6 (six) hours as needed for indigestion or heartburn.    Marland Kitchen. amLODipine (NORVASC) 5 MG tablet Take 1 tablet (5 mg total) by mouth daily. 90 tablet 3  . aspirin EC 81 MG tablet Take 81 mg by mouth daily.    . Azelastine-Fluticasone (DYMISTA) 137-50 MCG/ACT SUSP Place 1 spray into the nose daily as needed (allergies).    . clopidogrel (PLAVIX) 75 MG tablet TAKE 1 TABLET (75 MG TOTAL) BY MOUTH DAILY. 30 tablet 6  . Coenzyme Q10 200 MG capsule Take 200 mg by mouth daily.     . Garlic Oil 1000 MG CAPS Take 1 capsule by mouth daily.      . hydrochlorothiazide (HYDRODIURIL) 25 MG tablet  Take 1/2 tablet by mouth daily 15 tablet 1  . isosorbide mononitrate (IMDUR) 60 MG 24 hr tablet Take 1 tablet (60 mg total) by mouth daily. 90 tablet 3  . loratadine (CLARITIN) 10 MG tablet Take 10 mg by mouth daily as needed for rhinitis.     . metoprolol tartrate (LOPRESSOR) 25 MG tablet TAKE 1 TABLET BY MOUTH TWICE A DAY 60 tablet 7  . Multiple Vitamins-Minerals (CENTRUM SILVER) tablet Take 1 tablet by mouth daily.      . nitroGLYCERIN (NITROSTAT) 0.4 MG SL tablet Place 1 tablet (0.4 mg total) under the tongue every 5 (five) minutes as needed for chest pain. 25 tablet 3  . Omega-3 Fatty Acids (FISH OIL) 1000 MG CAPS Take 1 capsule by mouth daily.      . pantoprazole (PROTONIX) 40 MG tablet Take 1 tablet (40 mg total) by mouth daily. 30 tablet 5  . pravastatin (PRAVACHOL) 40 MG tablet  TAKE 1 TABLET (40 MG TOTAL) BY MOUTH EVERY EVENING. 90 tablet 3  . ranolazine (RANEXA) 500 MG 12 hr tablet Take 1 tablet (500 mg total) by mouth 2 (two) times daily. 60 tablet 11  . tamsulosin (FLOMAX) 0.4 MG CAPS capsule Take 0.4 mg by mouth daily.    Marland Kitchen telmisartan (MICARDIS) 40 MG tablet Take 1 tablet (40 mg total) by mouth daily. 90 tablet 1   No facility-administered medications prior to visit.       Allergies:   Review of patient's allergies indicates no known allergies.   Social History   Social History  . Marital status: Married    Spouse name: [name]  . Number of children: [name]  . Years of education: [name]   Occupational History  . retired    Social History Main Topics  . Smoking status: Never Smoker  . Smokeless tobacco: Never Used  . Alcohol use No  . Drug use: No  . Sexual activity: Not Currently   Other Topics Concern  . None   Social History Narrative   Daughter is [name] Psychologist, sport and exercise at [employer])     Family History:  The patient's family history includes Diabetes in his brother; Diabetes (age of onset: 40) in his mother; Hypertension in his mother; Stroke (age of  onset: 61) in his father.   ROS:   Please see the history of present illness.    ROS All other systems reviewed and are negative.   EKGs/Labs/Other Test Reviewed:    EKG:  EKG is not ordered today.  The ekg ordered today demonstrates n/a  Recent Labs: 11/09/2015: ALT 14 04/10/2016: BUN 17; Creat 1.35; Hemoglobin 12.7; Platelets 163; Potassium 4.1; Sodium 134; TSH 0.60   Recent Lipid Panel    Component Value Date/Time   CHOL 180 11/09/2015 1042   CHOL 164 04/04/2015 1059   TRIG 37.0 11/09/2015 1042   TRIG 42 04/04/2015 1059   HDL 85.00 11/09/2015 1042   HDL 82 04/04/2015 1059   CHOLHDL 2 11/09/2015 1042   VLDL 7.4 11/09/2015 1042   LDLCALC 88 11/09/2015 1042   LDLCALC 74 04/04/2015 1059   LDLDIRECT 131.5 11/06/2011 1057     Physical Exam:    VS:  BP 136/60   Pulse 68   Ht 5\' 4"  (1.626 m)   Wt 178 lb 12.8 oz (81.1 kg)   BMI 30.69 kg/m     Wt Readings from Last 3 Encounters:  05/11/16 178 lb 12.8 oz (81.1 kg)  05/01/16 179 lb (81.2 kg)  04/18/16 176 lb 6.4 oz (80 kg)     Physical Exam  Constitutional: He is oriented to person, place, and time. He appears well-developed and well-nourished. No distress.  HENT:  Head: Normocephalic and atraumatic.  Eyes: No scleral icterus.  Neck: Normal range of motion. No JVD present.  Cardiovascular: Normal rate, regular rhythm, S1 normal and S2 normal.   Murmur heard.  Harsh crescendo-decrescendo systolic murmur is present with a grade of 2/6  at the upper right sternal border Pulmonary/Chest: Effort normal and breath sounds normal. He has no wheezes. He has no rhonchi. He has no rales.  Abdominal: Soft. There is no tenderness.  Musculoskeletal: He exhibits no edema.  Neurological: He is alert and oriented to person, place, and time.  Skin: Skin is warm and dry.  Psychiatric: He has a normal mood and affect.    ASSESSMENT:    1. Other fatigue   2. Coronary artery disease involving native  coronary artery with other forms of  angina pectoris (HCC)   3. Aortic stenosis   4. Essential hypertension    PLAN:    In order of problems listed above:  1. Fatigue - Etiology not clear.  He may have had a viral syndrome.  His BP looks better.  Overall improved.   2. CAD - Hx of 3 vessel CAD s/p multivessel PCI to the mid LAD, distal LAD and OM1 in 7/16. LHC in 2/17 demonstrated patent stents and and severe stenosis of a small non-dominant RCA - to be treated medically.  He denies angina.  Continue current regimen with Ranexa, Amlodipine, ASA, Plavix, nitrates, beta-blocker, statin.   3. Aortic stenosis - Moderate AS by echo in 3/17 with mean gradient 28 mmHg.     4. HTN  -  BP controlled.  Continue current Rx.    Medication Adjustments/Labs and Tests Ordered: Current medicines are reviewed at length with the patient today.  Concerns regarding medicines are outlined above.  Medication changes, Labs and Tests ordered today are outlined in the Patient Instructions noted below. Patient Instructions  Medication Instructions:  Your physician recommends that you continue on your current medications as directed. Please refer to the Current Medication list given to you today.  Labwork: None Testing/Procedures: None Follow-Up: You have an appointment scheduled with Dr. Shirlee Latch 08/17/16 at 2:30 PM. Please call our scheduling department if you need to change appointment time.  Any Other Special Instructions Will Be Listed Below (If Applicable).  If you need a refill on your cardiac medications before your next appointment, please call your pharmacy.  Signed, Tereso Newcomer, PA-C  05/11/2016 1:08 PM    [facility] [address] [place], [place], [place]  [address] Phone: [phone]; Fax: [phone]

## 2016-05-11 ENCOUNTER — Ambulatory Visit (INDEPENDENT_AMBULATORY_CARE_PROVIDER_SITE_OTHER): Payer: Medicare Other | Admitting: Physician Assistant

## 2016-05-11 ENCOUNTER — Encounter: Payer: Self-pay | Admitting: Physician Assistant

## 2016-05-11 VITALS — BP 136/60 | HR 68 | Ht 64.0 in | Wt 178.8 lb

## 2016-05-11 DIAGNOSIS — R5383 Other fatigue: Secondary | ICD-10-CM | POA: Diagnosis not present

## 2016-05-11 DIAGNOSIS — I25118 Atherosclerotic heart disease of native coronary artery with other forms of angina pectoris: Secondary | ICD-10-CM

## 2016-05-11 DIAGNOSIS — I10 Essential (primary) hypertension: Secondary | ICD-10-CM | POA: Diagnosis not present

## 2016-05-11 DIAGNOSIS — I350 Nonrheumatic aortic (valve) stenosis: Secondary | ICD-10-CM

## 2016-05-11 NOTE — Patient Instructions (Addendum)
Medication Instructions:  Your physician recommends that you continue on your current medications as directed. Please refer to the Current Medication list given to you today.  Labwork: None Testing/Procedures: None Follow-Up: You have an appointment scheduled with Dr. Shirlee LatchMcLean 08/17/16 at 2:30 PM. Please call our scheduling department if you need to change appointment time.  Any Other Special Instructions Will Be Listed Below (If Applicable).  If you need a refill on your cardiac medications before your next appointment, please call your pharmacy.

## 2016-05-31 ENCOUNTER — Other Ambulatory Visit: Payer: Self-pay | Admitting: Cardiology

## 2016-05-31 DIAGNOSIS — I10 Essential (primary) hypertension: Secondary | ICD-10-CM

## 2016-05-31 DIAGNOSIS — I2510 Atherosclerotic heart disease of native coronary artery without angina pectoris: Secondary | ICD-10-CM

## 2016-06-06 ENCOUNTER — Ambulatory Visit (INDEPENDENT_AMBULATORY_CARE_PROVIDER_SITE_OTHER): Payer: Medicare Other

## 2016-06-06 ENCOUNTER — Telehealth: Payer: Self-pay

## 2016-06-06 VITALS — BP 146/70 | HR 57 | Ht 64.0 in | Wt 179.0 lb

## 2016-06-06 DIAGNOSIS — Z0000 Encounter for general adult medical examination without abnormal findings: Secondary | ICD-10-CM

## 2016-06-06 DIAGNOSIS — Z23 Encounter for immunization: Secondary | ICD-10-CM | POA: Diagnosis not present

## 2016-06-06 NOTE — Progress Notes (Addendum)
Subjective:   Ronnie Boyd is a 82 y.o. male who presents for Medicare Annual/Subsequent preventive examination.  HRA assessment completed during this visit with Mr. Ronnie Boyd   The Patient was informed that the wellness visit is to identify future health risk and educate and initiate measures that can reduce risk for increased disease through the lifespan.    NO ROS; Medicare Wellness Visit Describes health as good, fair or great?  Good; had a little heart issues this year; but otherwise is good;   Risk Associated with PMH  HTN: medically managed  CAD He has had stenting of three vessels in 2017 Mobility difficulty; does stairs but some sob Hyperlipidemia; medically managed  LABS 3/1/ 2017 HDL 85; LDL 88; Trig 37; A1c 6.8  Educated on pre-diabetes   Psychosocial (family hx reviewed and + for HTN; DM; stroke) Support Living situation; lives with spouse 3 children   Primary Prevention Tobacco never smoked  ETOH: No; never did drink Diet: Diabetic / eats fruit and vegetables;  Eats mostly 2 meals a day; good breakfast; sometimes bowl of oatmeal, cinnomon; Uncured [place]key bacon; OJ Blue berries 3 to 4 times a week  Eats walnuts; almonds; banana  Loves beans; does not eat red meat  Uses olive oil; fairly heart healthy   Exercise was not exercising regularly at the last office visit  Likes to work in the yard; garden Now he has to limit his exercise but understands he needs to start exercising again  Discussed diabetes and agreed to try and walk 30 minutes 5 days a week and try to lose 10 lbs.  Declines A1c today or to make apt with Dr. Lawerance BachBurns. STates he wants to try and lose a few lbs and exercise and then A1c will be better.  Will defer until scheduled apt in march of 2018, Educated on S/s of hyperglycemia; increased thirst and blurred vision.  Eye Exam due; last fall; no retinopathy; dr. Nelle DonHollander; generally has q 2 years but encouraged eye exam every year.   Dental; no; has  plates in   Fall hx; no   Given education on "Fall Prevention in the Home" for more safety tips the patient can apply as appropriate.  Safety 1. for risk such as safe community yes 2.  smoke detector/ yes 3.  firearms safety if applicable; keep in safe place  4. protection when in the sun; no; wears at straw hat  5. driving safety for seniors or any recent accidents.no    Screenings for secondary risk  Colonoscopy 12/2002- aged out EKG 04/2016  PSA; 11/2010  Other   Vaccination update:  Shingles due/ never had chicken pox; declines vaccination; educated on getting a vaccine if he would like, but will take the zostavax out of o/d screen as he declines at this time.  Flu shot due; will take today   Medications reviewed for issues;   Wondering if he can come off his pantoprazole;  Will send message to Dr. Lawerance BachBurns     Depression; anxiety or mood issues assessed  Do you have little interest or pleasure in doing things? no Have you been feeling down, depressed, hopeless? no PHQ9 waived or completed    Cognitive screen completed; MMSE documented or assessed for failures or issues with the AD8 screen below:   Ad8 score reviewed for issues;  Issues making decisions; no  Less interest in hobbies / activities" no  Repeats questions, stories; family complaining: NO  Trouble using ordinary gadgets; microwave; computer:  no  Forgets the month or year: no  Mismanaging finances: no  Missing apt: no but does write them down  Daily problems with thinking of memory NO Ad8 score is 0  MMSE not appropriate unless AD8 score is > 2   Advanced Directive reviewed for completion or educated regarding [facility] form; the electing a health care agent and completing the Living Will.    Established and updated Risk reviewed and appropriate referral made or health recommendations as appropriate based on individual needs and choices;   Current Care Team reviewed and updated    Cardiac  Risk Factors include: advanced age (>40men, >21 women);diabetes mellitus;dyslipidemia;family history of premature cardiovascular disease;hypertension;male gender;obesity (BMI >30kg/m2)     Objective:    Vitals: BP (!) 146/70   Pulse (!) 57   Ht 5\' 4"  (1.626 m)   Wt 179 lb (81.2 kg)   SpO2 97%   BMI 30.73 kg/m   Body mass index is 30.73 kg/m.  Tobacco History  Smoking Status  . Never Smoker  Smokeless Tobacco  . Never Used     Counseling given: Yes   Past Medical History:  Diagnosis Date  . Allergic rhinitis   . Aortic stenosis    mild echo 7/14  . Borderline type 2 diabetes mellitus   . Coronary artery disease   . Degenerative joint disease   . Family history of adverse reaction to anesthesia    "daughter had problems when she was a teen; none since; it was related to asthma"  . GERD (gastroesophageal reflux disease)   . Hemorrhoids   . History of hiatal hernia    "dx'd years ago" (06/01/2015)  . Hypercholesteremia   . Hypertension   . OSA on CPAP since 05/23/2015  . Prostatitis    hx of  . PVD (peripheral vascular disease) (HCC)   . Stroke [facility])    Past Surgical History:  Procedure Laterality Date  . CARDIAC CATHETERIZATION [name] 03/18/2015   Procedure: Left Heart Cath and Coronary Angiography;  Surgeon: Laurey Morale, MD;  Location: [facility] INVASIVE CV LAB;  Service: Cardiovascular;  Laterality: [name];  . CARDIAC CATHETERIZATION [name] 06/01/2015   Procedure: Coronary Stent Intervention;  Surgeon: Iran Ouch, MD;  Location: [facility] INVASIVE CV LAB;  Service: Cardiovascular;  Laterality: [name];  . CARDIAC CATHETERIZATION  11/11/2015  . CARDIAC CATHETERIZATION [name] 11/11/2015   Procedure: Intravascular Pressure Wire/FFR Study;  Surgeon: Tonny Bollman, MD;  Location: [facility] INVASIVE CV LAB;  Service: Cardiovascular;  Laterality: [name];  . CARPAL TUNNEL RELEASE Bilateral 2006-2007  . CATARACT EXTRACTION W/ INTRAOCULAR LENS  IMPLANT, BILATERAL Bilateral ~ 2003  . EXCISIONAL  HEMORRHOIDECTOMY  1989   Family History  Problem Relation Age of Onset  . Hypertension Mother   . Diabetes Mother 56  . Stroke Father 42  . Arthritis    . Diabetes Brother    History  Sexual Activity  . Sexual activity: Not Currently    Outpatient Encounter Prescriptions as of 06/06/2016  Medication Sig  . alum & mag hydroxide-simeth (MAALOX/MYLANTA) 200-200-20 MG/5ML suspension Take 15 mLs by mouth every 6 (six) hours as needed for indigestion or heartburn.  Marland Kitchen amLODipine (NORVASC) 5 MG tablet Take 1 tablet (5 mg total) by mouth daily.  Marland Kitchen aspirin EC 81 MG tablet Take 81 mg by mouth daily.  . clopidogrel (PLAVIX) 75 MG tablet TAKE 1 TABLET (75 MG TOTAL) BY MOUTH DAILY.  Marland Kitchen Coenzyme Q10 200 MG capsule Take 200 mg by mouth daily.   Marland Kitchen  Garlic Oil 1000 MG CAPS Take 1 capsule by mouth daily.    . hydrochlorothiazide (HYDRODIURIL) 25 MG tablet TAKE 1/2 TABLET BY MOUTH DAILY  . isosorbide mononitrate (IMDUR) 60 MG 24 hr tablet Take 1 tablet (60 mg total) by mouth daily.  Marland Kitchen loratadine (CLARITIN) 10 MG tablet Take 10 mg by mouth daily as needed for rhinitis.   . metoprolol tartrate (LOPRESSOR) 25 MG tablet TAKE 1 TABLET BY MOUTH TWICE A DAY  . Multiple Vitamins-Minerals (CENTRUM SILVER) tablet Take 1 tablet by mouth daily.    . nitroGLYCERIN (NITROSTAT) 0.4 MG SL tablet Place 1 tablet (0.4 mg total) under the tongue every 5 (five) minutes as needed for chest pain.  . Omega-3 Fatty Acids (FISH OIL) 1000 MG CAPS Take 1 capsule by mouth daily.    . pantoprazole (PROTONIX) 40 MG tablet TAKE 1 TABLET (40 MG TOTAL) BY MOUTH DAILY.  . pravastatin (PRAVACHOL) 40 MG tablet TAKE 1 TABLET (40 MG TOTAL) BY MOUTH EVERY EVENING.  . ranolazine (RANEXA) 500 MG 12 hr tablet Take 1 tablet (500 mg total) by mouth 2 (two) times daily.  . tamsulosin (FLOMAX) 0.4 MG CAPS capsule Take 0.4 mg by mouth daily.  Marland Kitchen telmisartan (MICARDIS) 40 MG tablet Take 1 tablet (40 mg total) by mouth daily.  . Azelastine-Fluticasone  (DYMISTA) 137-50 MCG/ACT SUSP Place 1 spray into the nose daily as needed (allergies).   No facility-administered encounter medications on file as of 06/06/2016.     Activities of Daily Living In your present state of health, do you have any difficulty performing the following activities: 06/06/2016 11/11/2015  Hearing? N -  Vision? N -  Difficulty concentrating or making decisions? N -  Walking or climbing stairs? N -  Dressing or bathing? N -  Doing errands, shopping? N N  Preparing Food and eating ? N -  Using the Toilet? N -  In the past six months, have you accidently leaked urine? Y -  Do you have problems with loss of bowel control? N -  Managing your Medications? N -  Managing your Finances? N -  Housekeeping or managing your Housekeeping? N -  Some recent data might be hidden    Patient Care Team: Pincus Sanes, MD as PCP - General (Internal Medicine) Laurey Morale, MD (Cardiology) Marcine Matar, MD (Urology) Melvyn Novas, MD (Neurology) Micki Riley, MD (Neurology)   Assessment:     Assessment included:  Review for health history including a functional assessment, fall risk, depression screen, memory loss, vision and hearing screens; Was educated and referred as appropriate. See Plan    Risk for independent living or long term plan; doing well aging in place at 10;   Risk for safety; Bathroom; community; firearms, sun protection; auto accidents  Spouse states this has been addressed by their long term care insurance plan which they are still paying for on a monthly basis until they use the plan for placement   All immunizations and overdue screens were reviewed for a plan or follow-up.   Labs were reviewed in regard to Lipids and A1c if appropriate.  Deferred A1c as he would like a chance to lose weight    Exercise Activities and Dietary recommendations Current Exercise Habits: Home exercise routine, Frequency (Times/Week): 5, Intensity: Mild  Goals     . exercise          Continue to do yard work and gardening; work in intervals      . Weight (  lb) < 170 lb (77.1 kg)          Will lose a few pounds; Try to walk  - 30 minutes 5 days a week             Fall Risk Fall Risk  06/06/2016 06/06/2016 07/20/2015 12/23/2014 12/22/2013  Falls in the past year? No No No No No  Risk for fall due to : - - Other (Comment) - -   Depression Screen PHQ 2/9 Scores 06/06/2016 11/02/2015 07/25/2015 07/20/2015  PHQ - 2 Score 0 0 0 0    Cognitive Testing MMSE - Mini Mental State Exam 06/06/2016 06/06/2016  Not completed: (No Data) (No Data)   Bright, mood stable; no issues identified; Denies any issues with AD8 screen   Immunization History  Administered Date(s) Administered  . Influenza Split 07/02/2011, 07/07/2012  . Influenza Whole 05/31/2008, 07/12/2009, 06/27/2010  . Influenza, High Dose Seasonal PF 06/26/2013, 06/22/2014, 06/29/2015, 06/06/2016  . Pneumococcal Conjugate-13 12/23/2014  . Pneumococcal Polysaccharide-23 12/22/2013  . Tdap 11/08/2004, 12/12/2014   Screening Tests Health Maintenance  Topic Date Due  . OPHTHALMOLOGY EXAM  08/04/1937  . ZOSTAVAX  08/03/1989  . INFLUENZA VACCINE  04/10/2016  . HEMOGLOBIN A1C  05/11/2016  . FOOT EXAM  05/01/2017  . TETANUS/TDAP  12/11/2024  . PNA vac Low Risk Adult  Completed      Plan:     Will take high does flu shot today  Will try to increase exercise to 30 min of walking 5 days a week Will seek help if increased in exercise causes add'l shortness of breath   Eye exam is scheduled with Dr. Nelle Don;  [phone]  During the course of the visit the patient was educated and counseled about the following appropriate screening and preventive services:   Vaccines to include Pneumoccal, Influenza, Hepatitis B, Td, Zostavax, HCV  Electrocardiogram  Cardiovascular Disease  Colorectal cancer screening aged out  Diabetes screening/ will recheck in March; declined recheck prior;  wants to lose weight  Prostate Cancer Screening  Glaucoma screening/ due to be examined this year; will confirm and postpone as indicated   Nutrition counseling / cardiac healthy; vegetables; fruits; nuts; no red meat  Smoking cessation counseling  Patient Instructions (the written plan) was given to the patient.    Will send a basket note to Dr. Lawerance Bach regarding him tapering off his PPI. (Protonix 40mg )   Breven Guidroz, RN  06/06/2016  Medical screening examination/treatment/procedure(s) were performed by non-physician practitioner and as supervising physician I was immediately available for consultation/collaboration. I agree with above. Oliver Barre, MD

## 2016-06-06 NOTE — Patient Instructions (Addendum)
Ronnie Boyd , Thank you for taking time to come for your Medicare Wellness Visit. I appreciate your ongoing commitment to your health goals. Please review the following plan we discussed and let me know if I can assist you in the future.   Will take high does flu shot today  Will try to increase exercise to 30 min of walking 5 days a week Will seek help if increased in exercise causes add'l shortness of breath   Eye exam is scheduled with Dr. Nelle Don;  [phone] Call to [facility] office and confirmed last visit 09/15/2015 He is due q 2 years but the patient was educated to start making apt annually or if he had vision issues.   These are the goals we discussed: Goals    . exercise          Continue to do yard work and gardening; work in intervals      . Weight (lb) < 170 lb (77.1 kg)          Will lose a few pounds; Try to walk  - 30 minutes 5 days a week              This is a list of the screening recommended for you and due dates:  Health Maintenance  Topic Date Due  . Eye exam for diabetics  [birth year]/04/1937  . Shingles Vaccine  08/03/1989  . Flu Shot  04/10/2016  . Hemoglobin A1C  05/11/2016  . Complete foot exam   05/01/2017  . Tetanus Vaccine  12/11/2024  . Pneumonia vaccines  Completed    Screening for Type 2 Diabetes Screening is a way to check for type 2 diabetes in people who do not have symptoms of the disease, but who may likely develop diabetes in the future. Diabetes can lead to serious health problems, but finding diabetes early allows for early treatment. DIABETES RISK FACTORS   Family history of diabetes.  Diseases of the pancreas.  Obesity or being overweight.  Certain racial or ethnic groups:  American [place].  Pacific Islander.  Hispanic.  Asian.  African American.  High blood pressure (hypertension).  History of diabetes while pregnant (gestational diabetes).  Delivering a baby that weighed over 9 pounds.  Being  inactive.  High cholesterol or triglycerides.  Age, especially over 78 years of age.  Other diseases or conditions.  Diseases of the pancreas.  Cardiovascular disease.  Disorders of the endocrine system.  Certain medicines, such as those that treat high blood cholesterol levels. WHO IS SCREENED Adults  Adults who have no risk factors and no symptoms should be screened starting at age 70. If the screening tests are normal, they should be repeated every 3 years.  Adults who do not have symptoms, but have 1 or more risk factors, should be screened.  Adults who have 2 or more risk factors may be screened every year.  Adults who have an A1c (3 month average of blood glucose) greater than 5.7% or who had an impaired glucose tolerance (IGT) or impaired fasting glucose (IFG) on a previous test should be screened.  Pregnant women who have risk factors should be screened at their first prenatal visit.  Women who have given birth and had gestational diabetes should be screened 6-12 weeks after the child is born. This screening should be repeated every 1-3 years after the first test. Children or Adolescents  Children and adolescents should be screened for type 2 diabetes if they are overweight and have 2  of the following risk factors:  Having a family history of type 2 diabetes.  Being a member of a high risk race or ethnic group.  Having signs of insulin resistance or conditions associated with insulin resistance.  Having a mother who had gestational diabetes while pregnant with him or her.  Screening should start at age 74 or at the onset of puberty, whichever comes first. This should be repeated every 2 years. SCREENING In a screening, your caregiver may:  Ask questions about your overall health. This will include questions about the health of close family members, too.  Ask about any diabetes-like symptoms you may have.  Perform a physical exam.  Order some tests that may  include:  A fasting plasma glucose test. This measures the level of glucose in your blood. It is done after you have had nothing to eat but water (fasted) for 8 hours.  A random blood glucose test. This test is done without the need to fast.  An oral glucose tolerance test. This is a blood test done in 2 parts. First, a blood sample is taken after you have fasted. Then, another sample is taken after you drink a liquid that contains a lot of sugar.  An A1c test. This test shows how much glucose has been in your blood over the past 2 to 3 months.   This information is not intended to replace advice given to you by your health care provider. Make sure you discuss any questions you have with your health care provider.   Document Released: 06/23/2009 Document Revised: 09/17/2014 Document Reviewed: 04/04/2011 Elsevier Interactive Patient Education 2016 [employer].   Fall Prevention in the Home  Falls can cause injuries. They can happen to people of all ages. There are many things you can do to make your home safe and to help prevent falls.  WHAT CAN I DO ON THE OUTSIDE OF MY HOME?  Regularly fix the edges of walkways and driveways and fix any cracks.  Remove anything that might make you trip as you walk through a door, such as a raised step or threshold.  Trim any bushes or trees on the path to your home.  Use bright outdoor lighting.  Clear any walking paths of anything that might make someone trip, such as rocks or tools.  Regularly check to see if handrails are loose or broken. Make sure that both sides of any steps have handrails.  Any raised decks and porches should have guardrails on the edges.  Have any leaves, snow, or ice cleared regularly.  Use sand or salt on walking paths during winter.  Clean up any spills in your garage right away. This includes oil or grease spills. WHAT CAN I DO IN THE BATHROOM?   Use night lights.  Install grab bars by the toilet and in the  tub and shower. Do not use towel bars as grab bars.  Use non-skid mats or decals in the tub or shower.  If you need to sit down in the shower, use a plastic, non-slip stool.  Keep the floor dry. Clean up any water that spills on the floor as soon as it happens.  Remove soap buildup in the tub or shower regularly.  Attach bath mats securely with double-sided non-slip rug tape.  Do not have throw rugs and other things on the floor that can make you trip. WHAT CAN I DO IN THE BEDROOM?  Use night lights.  Make sure that you have a light  by your bed that is easy to reach.  Do not use any sheets or blankets that are too big for your bed. They should not hang down onto the floor.  Have a firm chair that has side arms. You can use this for support while you get dressed.  Do not have throw rugs and other things on the floor that can make you trip. WHAT CAN I DO IN THE KITCHEN?  Clean up any spills right away.  Avoid walking on wet floors.  Keep items that you use a lot in easy-to-reach places.  If you need to reach something above you, use a strong step stool that has a grab bar.  Keep electrical cords out of the way.  Do not use floor polish or wax that makes floors slippery. If you must use wax, use non-skid floor wax.  Do not have throw rugs and other things on the floor that can make you trip. WHAT CAN I DO WITH MY STAIRS?  Do not leave any items on the stairs.  Make sure that there are handrails on both sides of the stairs and use them. Fix handrails that are broken or loose. Make sure that handrails are as long as the stairways.  Check any carpeting to make sure that it is firmly attached to the stairs. Fix any carpet that is loose or worn.  Avoid having throw rugs at the top or bottom of the stairs. If you do have throw rugs, attach them to the floor with carpet tape.  Make sure that you have a light switch at the top of the stairs and the bottom of the stairs. If you  do not have them, ask someone to add them for you. WHAT ELSE CAN I DO TO HELP PREVENT FALLS?  Wear shoes that:  Do not have high heels.  Have rubber bottoms.  Are comfortable and fit you well.  Are closed at the toe. Do not wear sandals.  If you use a stepladder:  Make sure that it is fully opened. Do not climb a closed stepladder.  Make sure that both sides of the stepladder are locked into place.  Ask someone to hold it for you, if possible.  Clearly mark and make sure that you can see:  Any grab bars or handrails.  First and last steps.  Where the edge of each step is.  Use tools that help you move around (mobility aids) if they are needed. These include:  Canes.  Walkers.  Scooters.  Crutches.  Turn on the lights when you go into a dark area. Replace any light bulbs as soon as they burn out.  Set up your furniture so you have a clear path. Avoid moving your furniture around.  If any of your floors are uneven, fix them.  If there are any pets around you, be aware of where they are.  Review your medicines with your doctor. Some medicines can make you feel dizzy. This can increase your chance of falling. Ask your doctor what other things that you can do to help prevent falls.   This information is not intended to replace advice given to you by your health care provider. Make sure you discuss any questions you have with your health care provider.   Document Released: 06/23/2009 Document Revised: 01/11/2015 Document Reviewed: 10/01/2014 Elsevier Interactive Patient Education 2016 [employer]  Health Maintenance, Male A healthy lifestyle and preventative care can promote health and wellness.  Maintain regular health, dental, and  eye exams.  Eat a healthy diet. Foods like vegetables, fruits, whole grains, low-fat dairy products, and lean protein foods contain the nutrients you need and are low in calories. Decrease your intake of foods high in solid fats,  added sugars, and salt. Get information about a proper diet from your health care provider, if necessary.  Regular physical exercise is one of the most important things you can do for your health. Most adults should get at least 150 minutes of moderate-intensity exercise (any activity that increases your heart rate and causes you to sweat) each week. In addition, most adults need muscle-strengthening exercises on 2 or more days a week.   Maintain a healthy weight. The body mass index (BMI) is a screening tool to identify possible weight problems. It provides an estimate of body fat based on height and weight. Your health care provider can find your BMI and can help you achieve or maintain a healthy weight. For males 20 years and older:  A BMI below 18.5 is considered underweight.  A BMI of 18.5 to 24.9 is normal.  A BMI of 25 to 29.9 is considered overweight.  A BMI of 30 and above is considered obese.  Maintain normal blood lipids and cholesterol by exercising and minimizing your intake of saturated fat. Eat a balanced diet with plenty of fruits and vegetables. Blood tests for lipids and cholesterol should begin at age 18 and be repeated every 5 years. If your lipid or cholesterol levels are high, you are over age 22, or you are at high risk for heart disease, you may need your cholesterol levels checked more frequently.Ongoing high lipid and cholesterol levels should be treated with medicines if diet and exercise are not working.  If you smoke, find out from your health care provider how to quit. If you do not use tobacco, do not start.  Lung cancer screening is recommended for adults aged 55-80 years who are at high risk for developing lung cancer because of a history of smoking. A yearly low-dose CT scan of the lungs is recommended for people who have at least a 30-pack-year history of smoking and are current smokers or have quit within the past 15 years. A pack year of smoking is smoking an  average of 1 pack of cigarettes a day for 1 year (for example, a 30-pack-year history of smoking could mean smoking 1 pack a day for 30 years or 2 packs a day for 15 years). Yearly screening should continue until the smoker has stopped smoking for at least 15 years. Yearly screening should be stopped for people who develop a health problem that would prevent them from having lung cancer treatment.  If you choose to drink alcohol, do not have more than 2 drinks per day. One drink is considered to be 12 oz (360 mL) of beer, 5 oz (150 mL) of wine, or 1.5 oz (45 mL) of liquor.  Avoid the use of street drugs. Do not share needles with anyone. Ask for help if you need support or instructions about stopping the use of drugs.  High blood pressure causes heart disease and increases the risk of stroke. High blood pressure is more likely to develop in:  People who have blood pressure in the end of the normal range (100-139/85-89 mm Hg).  People who are overweight or obese.  People who are African American.  If you are 37-30 years of age, have your blood pressure checked every 3-5 years. If you are 40  years of age or older, have your blood pressure checked every year. You should have your blood pressure measured twice--once when you are at a hospital or clinic, and once when you are not at a hospital or clinic. Record the average of the two measurements. To check your blood pressure when you are not at a hospital or clinic, you can use:  An automated blood pressure machine at a pharmacy.  A home blood pressure monitor.  If you are 35-81 years old, ask your health care provider if you should take aspirin to prevent heart disease.  Diabetes screening involves taking a blood sample to check your fasting blood sugar level. This should be done once every 3 years after age 77 if you are at a normal weight and without risk factors for diabetes. Testing should be considered at a younger age or be carried out more  frequently if you are overweight and have at least 1 risk factor for diabetes.  Colorectal cancer can be detected and often prevented. Most routine colorectal cancer screening begins at the age of 104 and continues through age 73. However, your health care provider may recommend screening at an earlier age if you have risk factors for colon cancer. On a yearly basis, your health care provider may provide home test kits to check for hidden blood in the stool. A small camera at the end of a tube may be used to directly examine the colon (sigmoidoscopy or colonoscopy) to detect the earliest forms of colorectal cancer. Talk to your health care provider about this at age 62 when routine screening begins. A direct exam of the colon should be repeated every 5-10 years through age 22, unless early forms of precancerous polyps or small growths are found.  People who are at an increased risk for hepatitis B should be screened for this virus. You are considered at high risk for hepatitis B if:  You were born in a country where hepatitis B occurs often. Talk with your health care provider about which countries are considered high risk.  Your parents were born in a high-risk country and you have not received a shot to protect against hepatitis B (hepatitis B vaccine).  You have HIV or AIDS.  You use needles to inject street drugs.  You live with, or have sex with, someone who has hepatitis B.  You are a man who has sex with other men (MSM).  You get hemodialysis treatment.  You take certain medicines for conditions like cancer, organ transplantation, and autoimmune conditions.  Hepatitis C blood testing is recommended for all people born from [birth year] through 1963 and any individual with known risk factors for hepatitis C.  Healthy men should no longer receive prostate-specific antigen (PSA) blood tests as part of routine cancer screening. Talk to your health care provider about prostate cancer  screening.  Testicular cancer screening is not recommended for adolescents or adult males who have no symptoms. Screening includes self-exam, a health care provider exam, and other screening tests. Consult with your health care provider about any symptoms you have or any concerns you have about testicular cancer.  Practice safe sex. Use condoms and avoid high-risk sexual practices to reduce the spread of sexually transmitted infections (STIs).  You should be screened for STIs, including gonorrhea and chlamydia if:  You are sexually active and are younger than 24 years.  You are older than 24 years, and your health care provider tells you that you are at risk for this  type of infection.  Your sexual activity has changed since you were last screened, and you are at an increased risk for chlamydia or gonorrhea. Ask your health care provider if you are at risk.  If you are at risk of being infected with HIV, it is recommended that you take a prescription medicine daily to prevent HIV infection. This is called pre-exposure prophylaxis (PrEP). You are considered at risk if:  You are a man who has sex with other men (MSM).  You are a heterosexual man who is sexually active with multiple partners.  You take drugs by injection.  You are sexually active with a partner who has HIV.  Talk with your health care provider about whether you are at high risk of being infected with HIV. If you choose to begin PrEP, you should first be tested for HIV. You should then be tested every 3 months for as long as you are taking PrEP.  Use sunscreen. Apply sunscreen liberally and repeatedly throughout the day. You should seek shade when your shadow is shorter than you. Protect yourself by wearing long sleeves, pants, a wide-brimmed hat, and sunglasses year round whenever you are outdoors.  Tell your health care provider of new moles or changes in moles, especially if there is a change in shape or color. Also, tell  your health care provider if a mole is larger than the size of a pencil eraser.  A one-time screening for abdominal aortic aneurysm (AAA) and surgical repair of large AAAs by ultrasound is recommended for men aged 65-75 years who are current or former smokers.  Stay current with your vaccines (immunizations).   This information is not intended to replace advice given to you by your health care provider. Make sure you discuss any questions you have with your health care provider.   Document Released: 02/23/2008 Document Revised: 09/17/2014 Document Reviewed: 01/22/2011 Elsevier Interactive Patient Education [employer]

## 2016-06-06 NOTE — Telephone Encounter (Signed)
Ok to try.  He should try taking every other day for 2 weeks and then stop.  May need to take zantac if GERd recurs - this is not as strong and he would likely be able to get off this as well in the near future.

## 2016-06-06 NOTE — Telephone Encounter (Signed)
Spoke with pt to inform.  

## 2016-06-06 NOTE — Telephone Encounter (Signed)
Mr Lawther in for AWV 09/27; doing well;  Asked if he could try to come off his Protonix 40mg ; states he was not eating as well as he is now. Please advise.  Tks
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
## 2016-06-17 ENCOUNTER — Other Ambulatory Visit: Payer: Self-pay | Admitting: Cardiology

## 2016-06-21 ENCOUNTER — Ambulatory Visit: Payer: Medicare Other | Admitting: Neurology

## 2016-07-17 ENCOUNTER — Ambulatory Visit: Payer: Medicare Other | Admitting: Neurology

## 2016-08-14 ENCOUNTER — Other Ambulatory Visit: Payer: Self-pay | Admitting: Physician Assistant

## 2016-08-14 NOTE — Telephone Encounter (Signed)
Review for refill. 

## 2016-08-17 ENCOUNTER — Encounter: Payer: Self-pay | Admitting: Cardiology

## 2016-08-17 ENCOUNTER — Ambulatory Visit (INDEPENDENT_AMBULATORY_CARE_PROVIDER_SITE_OTHER): Payer: Medicare Other | Admitting: Cardiology

## 2016-08-17 VITALS — BP 200/96 | HR 59 | Ht 64.0 in | Wt 179.0 lb

## 2016-08-17 DIAGNOSIS — I10 Essential (primary) hypertension: Secondary | ICD-10-CM | POA: Diagnosis not present

## 2016-08-17 DIAGNOSIS — I2510 Atherosclerotic heart disease of native coronary artery without angina pectoris: Secondary | ICD-10-CM | POA: Diagnosis not present

## 2016-08-17 DIAGNOSIS — I739 Peripheral vascular disease, unspecified: Secondary | ICD-10-CM | POA: Diagnosis not present

## 2016-08-17 DIAGNOSIS — I350 Nonrheumatic aortic (valve) stenosis: Secondary | ICD-10-CM | POA: Diagnosis not present

## 2016-08-17 MED ORDER — AMLODIPINE BESYLATE 10 MG PO TABS: 10.0000 mg | ORAL_TABLET | Freq: Every day | ORAL | 3 refills | Status: DC

## 2016-08-17 MED ORDER — LOSARTAN POTASSIUM 25 MG PO TABS: 25.0000 mg | ORAL_TABLET | Freq: Every day | ORAL | 3 refills | Status: DC

## 2016-08-17 NOTE — Patient Instructions (Addendum)
,  Medication Instructions:  Increase amlodipine to 10 mg daily.  Start losartan 25mg  daily  Labwork: Lipid profile/BMET today.  BMET in about 10 days.  Testing/Procedures: Your physician has requested that you have an echocardiogram. Echocardiography is a painless test that uses sound waves to create images of your heart. It provides your doctor with information about the size and shape of your heart and how well your heart's chambers and valves are working. This procedure takes approximately one hour. There are no restrictions for this procedure. MARCH 2018  Your physician has requested that you have a lower  extremity arterial duplex. This test is an ultrasound of the arteries in the legs. It looks at arterial blood flow in the legs. Allow one hour for Lower  Arterial scans. There are no restrictions or special instructions  MARCH 2018 Follow-Up: Your physician recommends that you schedule a follow-up appointment in: 1 month with Dr Tenny Crawoss or Dr Katrinka BlazingSmith.        If you need a refill on your cardiac medications before your next appointment, please call your pharmacy.

## 2016-08-18 LAB — BASIC METABOLIC PANEL
BUN: 15 mg/dL (ref 7–25)
CHLORIDE: 103 mmol/L (ref 98–110)
CO2: 28 mmol/L (ref 20–31)
CREATININE: 1.27 mg/dL — AB (ref 0.70–1.11)
Calcium: 9.3 mg/dL (ref 8.6–10.3)
Glucose, Bld: 82 mg/dL (ref 65–99)
Potassium: 4.5 mmol/L (ref 3.5–5.3)
Sodium: 138 mmol/L (ref 135–146)

## 2016-08-18 LAB — LIPID PANEL
CHOLESTEROL: 180 mg/dL (ref <200)
HDL: 90 mg/dL (ref >40)
LDL CALC: 77 mg/dL (ref <100)
TRIGLYCERIDES: 64 mg/dL (ref <150)
Total CHOL/HDL Ratio: 2 Ratio (ref <5.0)
VLDL: 13 mg/dL (ref <30)

## 2016-08-19 NOTE — Progress Notes (Signed)
Patient ID: Margie BilletWilliam G Battey, male   DOB: 1927-04-22, 82 y.o.   MRN: [id number] PCP: Dr. Lawerance BachBurns  82 yo with HTN, diet-controlled diabetes, hyperlipidemia, moderate aortic stenosis, CVA, CAD, and PAD presents for cardiology followup.  Patient was admitted in 6/16 with small right medial thalamic/mid brain lacunar infarct manifesting with balance difficulty and slight drooping of the right eyelid.  Strokes signs completely resolved with no residual defect.  He had a carotid US in 6/16 without significant stenosis.    Patient also had been noting tightness in his chest with moderate exertion (yardwork, carrying a load).  Echo showed normal EF and moderate AS in 6/16.  He had an abnormal Cardiolite which was followed by LHC in 7/16 showing significant 3 vessel disease.  He was started on Imdur and metoprolol was increased.  He was referred to cardiac surgery for evaluation as CABG was thought to be most likely to provide complete revascularization in this situation.   He saw Dr Dorris FetchHendrickson.  After discussion with neurology, it was decided that as long as he remains symptomatically stable, he should not have CABG until 3 months after CVA.  Ultimately, we elected to treat him percutaneously.  He had Synergy DES biodegradable stents to the mLAD, dLAD, and OM1.   He was admitted with unstable angina in 3/17.  Cath did not show interventional target though he had significant disease.  He was managed medically.    He continues to have chronic stable angina.  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  No problems with walking.  No NTG use.  No exertional dyspnea.  No change in his chronic claudication pattern, this is mild.  No lightheadedness/syncope.  His Micardis was stopped and amlodipine decreased earlier this year due to low BP.  Today, BP is 200/96. No headache.   Labs (3/12): K 3.9, creatinine 1.0, TSH normal, LDL 150, HDL 76 Labs (6/12): LFTs normal, LDL 91, HDL 71 Labs (8/12): LDL 62, HDL  75 LBS (10/12): K 3.6, creatinine 1.1 Labs (5/13): LDL 75, HDL 73 Labs (10/13): K 3.7, creatinine 1.1 Labs (5/14): LDL 81, HDL 76, K 3.7, creatinine 1.1 Labs (7/16): K 3.7, creatinine 1.27, LDL 74, LDL-P 724 Labs (9/16): K 4.5, creatinine 1.33, HCT 42.8 Labs (8/17): K 4.1, creatinine 1.35, hgb 12.7, TSH normal  PMH: 1. HTN 2. Hyperlipidemia: Myalgias with Crestor.  3. Diabetes mellitus: diet-controlled 4. Allergic rhinitis 5. CAD: Myoview (4/05) with no ischemia or infarction.  LHC (7/16) with 60% pLAD, 80% mLAD, 80-90% dLAD, 70+% proximal LCx after high OM, 90% proximal high OM with diffuse severe distal disease, nondominant RCA with 80% ostial stenosis, 90% proximal stenosis, and 90% mid vessel stenosis.  9/16 PCI with Synergy biodegradable DES to dLAD, mLAD, and OM1.  - Unstable angina 3/17: LHC with 90% nondominant proximal RCA, proximal LAD 60% stenosis (FFR negative), mid-distal LAD ok, patent ramus stent, 99% stenosis distal ramus, 90% stenosis distal left PDA (small vessel at this point).  No intervention.  6. PAD: ABIs 2005 with 0.92 on right, 0.78 on left.  Peripheral arterial evaluation (5/12): ABI 0.82 right/0.79 left, TBI 0.69 right/0.54 left (not at risk of tissue loss).  ABIs (12/13) with 0.84 right, 0.97 left (improved).  Peripheral arterial dopplers (8/16) with ABI 0.76 on right, 0.73 on left => occluded bilateral anterior tibial arteries and left posterior tibial artery.  7. GERD 8. Prostatitis 9. S/p inguinal hernia repair. 10. Aortic stenosis: Moderate by echo.  Echo (3/12)  with mild LVH, mild to moderate focal basal septal hypertrophy with no LV outflow tract gradient, mild aortic stenosis, normal RV size and systolic function.  Echo (6/16) with EF 65-70%, moderate AS with mean gradient 28 mmHg, AVA 1.12 cm^2.  11. CVA (6/16): Right medial thalamic/midbrain lacunar infarct.  6/16 carotid dopplers without significant stenosis.  12. OSA: CPAP    SH: Retired, married, never  smoked.    FH: Father with CVA in his 54s, no premature CAD, no AAA  ROS: All systems reviewed and negative except as per HPI.    Current Outpatient Prescriptions  Medication Sig Dispense Refill  . alum & mag hydroxide-simeth (MAALOX/MYLANTA) 200-200-20 MG/5ML suspension Take 15 mLs by mouth every 6 (six) hours as needed for indigestion or heartburn.    Marland Kitchen aspirin EC 81 MG tablet Take 81 mg by mouth daily.    . Azelastine-Fluticasone (DYMISTA) 137-50 MCG/ACT SUSP Place 1 spray into the nose daily as needed (allergies).    . clopidogrel (PLAVIX) 75 MG tablet TAKE 1 TABLET (75 MG TOTAL) BY MOUTH DAILY. 30 tablet 6  . Coenzyme Q10 200 MG capsule Take 200 mg by mouth daily.     . Garlic Oil 1000 MG CAPS Take 1 capsule by mouth daily.      . hydrochlorothiazide (HYDRODIURIL) 25 MG tablet TAKE 1/2 TABLET BY MOUTH DAILY 15 tablet 11  . isosorbide mononitrate (IMDUR) 60 MG 24 hr tablet Take 1 tablet (60 mg total) by mouth daily. 90 tablet 3  . loratadine (CLARITIN) 10 MG tablet Take 10 mg by mouth daily as needed for rhinitis.     . metoprolol tartrate (LOPRESSOR) 25 MG tablet TAKE 1 TABLET BY MOUTH TWICE A DAY 60 tablet 7  . Multiple Vitamins-Minerals (CENTRUM SILVER) tablet Take 1 tablet by mouth daily.      . nitroGLYCERIN (NITROSTAT) 0.4 MG SL tablet PLACE 1 TABLET (0.4 MG TOTAL) UNDER THE TONGUE EVERY 5 (FIVE) MINUTES AS NEEDED FOR CHEST PAIN. 25 tablet 3  . Omega-3 Fatty Acids (FISH OIL) 1000 MG CAPS Take 1 capsule by mouth daily.      . pantoprazole (PROTONIX) 40 MG tablet TAKE 1 TABLET (40 MG TOTAL) BY MOUTH DAILY. 30 tablet 11  . pravastatin (PRAVACHOL) 40 MG tablet TAKE 1 TABLET (40 MG TOTAL) BY MOUTH EVERY EVENING. 90 tablet 3  . ranolazine (RANEXA) 500 MG 12 hr tablet Take 1 tablet (500 mg total) by mouth 2 (two) times daily. 60 tablet 11  . tamsulosin (FLOMAX) 0.4 MG CAPS capsule Take 0.4 mg by mouth daily.    Marland Kitchen telmisartan (MICARDIS) 40 MG tablet TAKE 1 TABLET (40 MG TOTAL) BY MOUTH  DAILY. 90 tablet 3  . amLODipine (NORVASC) 10 MG tablet Take 1 tablet (10 mg total) by mouth daily. 180 tablet 3  . losartan (COZAAR) 25 MG tablet Take 1 tablet (25 mg total) by mouth daily. 90 tablet 3   No current facility-administered medications for this visit.     BP (!) 200/96 (BP Location: Left Arm, Patient Position: Sitting, Cuff Size: Normal)   Pulse (!) 59   Ht 5\' 4"  (1.626 m)   Wt 179 lb (81.2 kg)   BMI 30.73 kg/m  General: NAD Neck: JVP 7 cm, no thyromegaly or thyroid nodule.  Lungs: Clear to auscultation bilaterally with normal respiratory effort. CV: Nondisplaced PMI.  Heart regular S1/S2, no S3/S4, 3/6 systolic crescendo-decrescendo murmur RUSB with some obscuring of S2 and radiation of murmur to neck.  1+ ankle  edema.  1+ left PT pulse, difficult to palpate R PT pulse.  Abdomen: Soft, nontender, no hepatosplenomegaly, no distention.  Neurologic: Alert and oriented x 3.  Psych: Normal affect. Extremities: No clubbing or cyanosis.   Assessment/Plan: 1. CAD: Diffuse severe 3 vessel disease.  Now s/p PCI in 9/16 with Synergy biodegradable stents to the mLAD, dLAD, and OM1.  Cath in 3/17 showed no interventional target.  He has mild chronic stable angina.  No recent NTG use.   - Continue Imdur, metoprolol, and ranolazine for angina.  - Continue pravastatin, ASA 81, and Plavix 75 daily.  I will continue Plavix long-term given history of CVA as well as CAD.   - Continue cardiac rehab. 2. Aortic stenosis: Moderate on last echo.  He would likely be a TAVR candidate in future if AS worsens.   - Repeat echo in 3/18.  3. CVA: 6/16 lacunar CVA, followed by neurology, on Plavix/ASA.  4. PAD: Bilateral calf pain after walking about 100 yards. Extensive below-knee PAD.  Medical management.   - Continue statin, Plavix, and ASA.   - Repeat peripheral arterial dopplers in 3/18.  5. Hyperlipidemia: Continue pravastatin 40 mg daily.  Check lipids today.  6. HTN: BP high, Micardis has  been stopped and amlodipine cut back.  - Increase amlodipine back to 10 mg daily.  - Add losartan 25 mg daily, BMET in 10 days.  - He will check BP daily and we will call for readings in 2 wks.   7. OSA: Use CPAP.   Given my transition to CHF clinic, I am going to have him followup with Dr. Tenny Crawoss or Dr Katrinka BlazingSmith.  He will see whichever has an appointment to see him in 2 months.    Marca AnconaDalton Viyan Rosamond 08/19/2016

## 2016-08-27 ENCOUNTER — Other Ambulatory Visit: Payer: Medicare Other | Admitting: *Deleted

## 2016-08-27 DIAGNOSIS — I739 Peripheral vascular disease, unspecified: Secondary | ICD-10-CM

## 2016-08-27 DIAGNOSIS — I350 Nonrheumatic aortic (valve) stenosis: Secondary | ICD-10-CM

## 2016-08-28 LAB — BASIC METABOLIC PANEL
BUN: 21 mg/dL (ref 7–25)
CALCIUM: 9.2 mg/dL (ref 8.6–10.3)
CO2: 27 mmol/L (ref 20–31)
CREATININE: 1.53 mg/dL — AB (ref 0.70–1.11)
Chloride: 101 mmol/L (ref 98–110)
GLUCOSE: 100 mg/dL — AB (ref 65–99)
Potassium: 4.5 mmol/L (ref 3.5–5.3)
SODIUM: 136 mmol/L (ref 135–146)

## 2016-09-05 IMAGING — US US CAROTID DUPLEX BILAT
1 series · 13 of 24 positions shown · non-contrast
Comparison: Brain MRI 03/04/2015

CLINICAL DATA: 87-year-old male with new cerebral vascular accident
in the right thalamus and midbrain.

EXAM:
BILATERAL CAROTID DUPLEX ULTRASOUND
TECHNIQUE: Gray scale imaging, color Doppler and duplex ultrasound were
performed of bilateral carotid and vertebral arteries in the neck.

[Series 1: us carotid duplex bilat · 0.08mm/px · 13 of 55 slices shown]
[im 1/55]
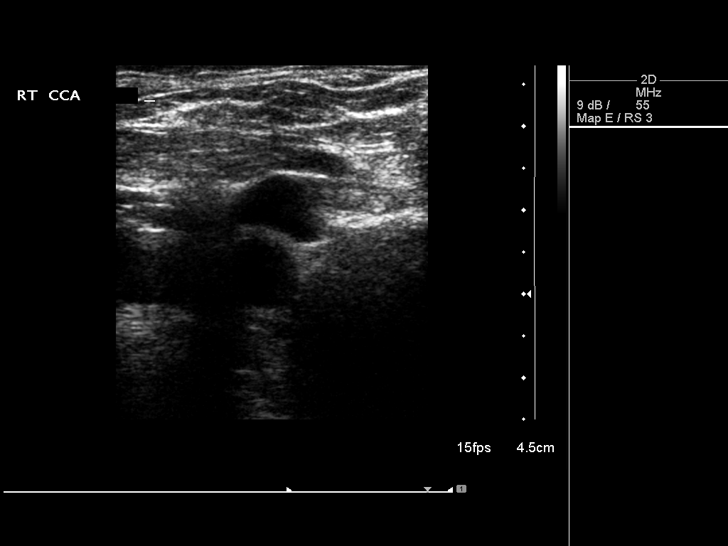
[im 5/55]
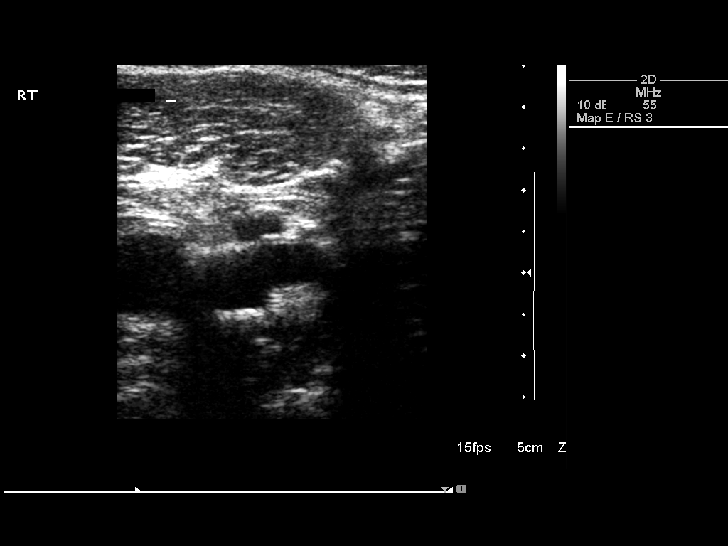
[im 10/55]
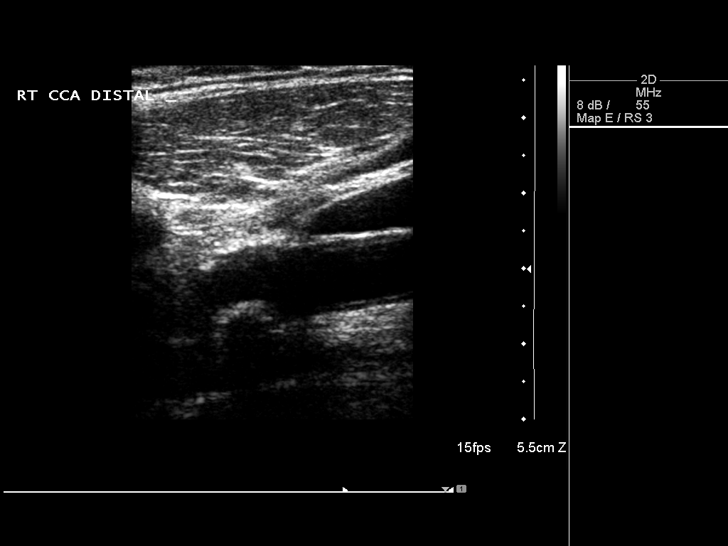
[im 15/55]
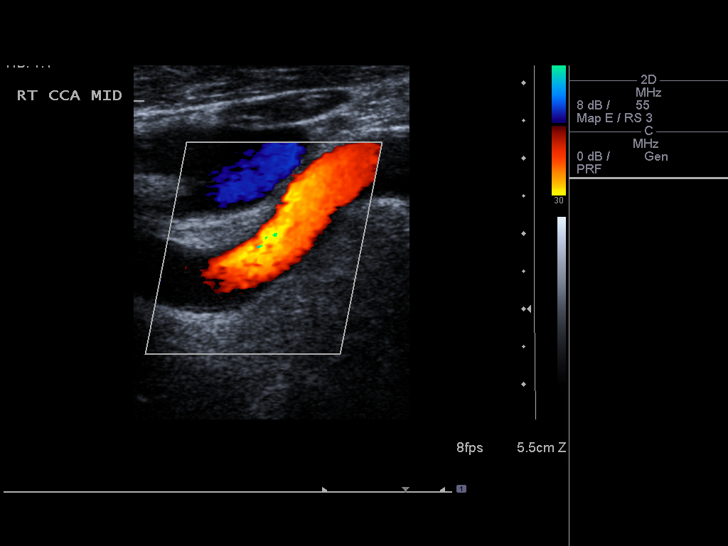
[im 19/55]
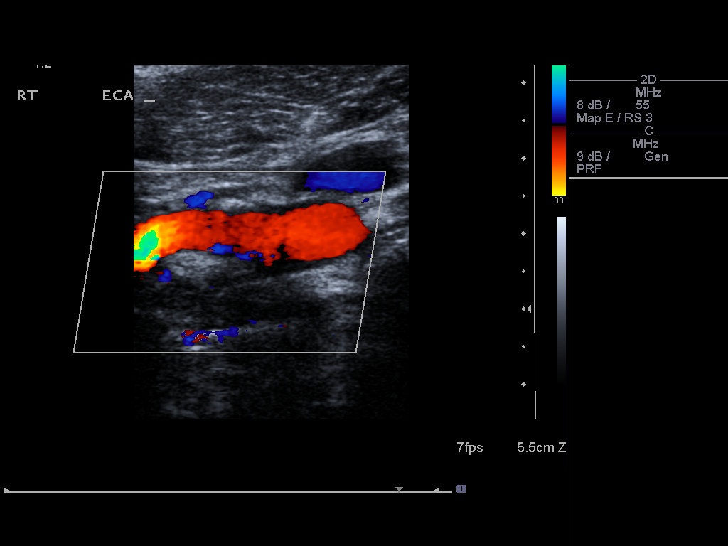
[im 24/55]
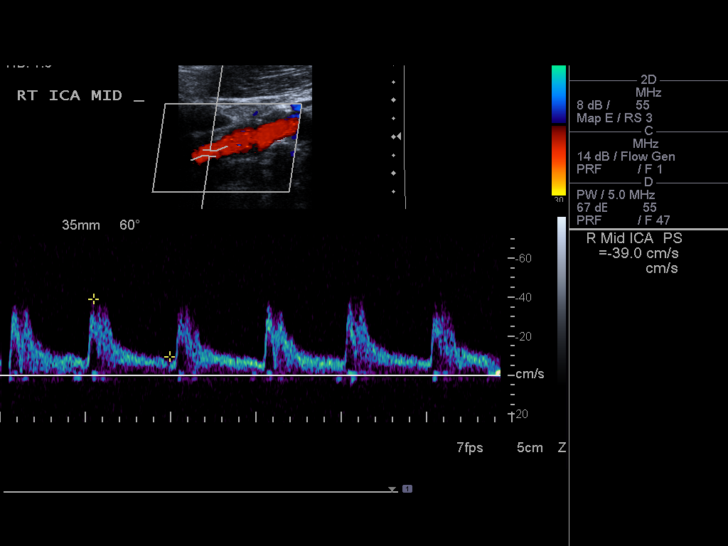
[im 29/55]
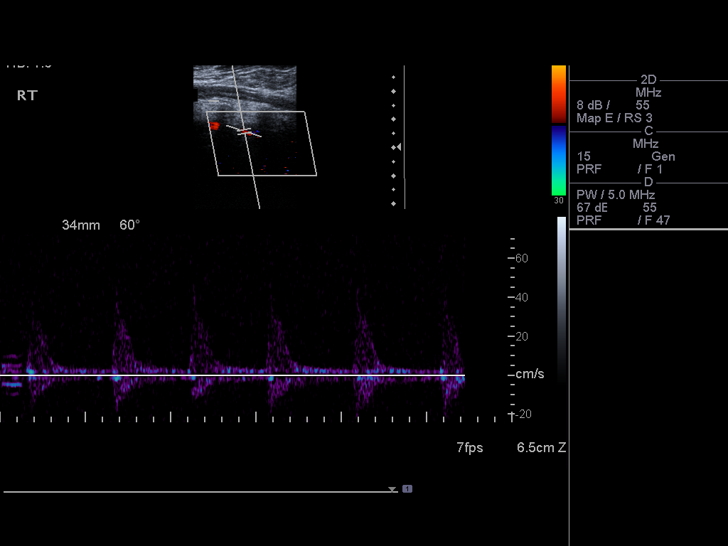
[im 31/55]
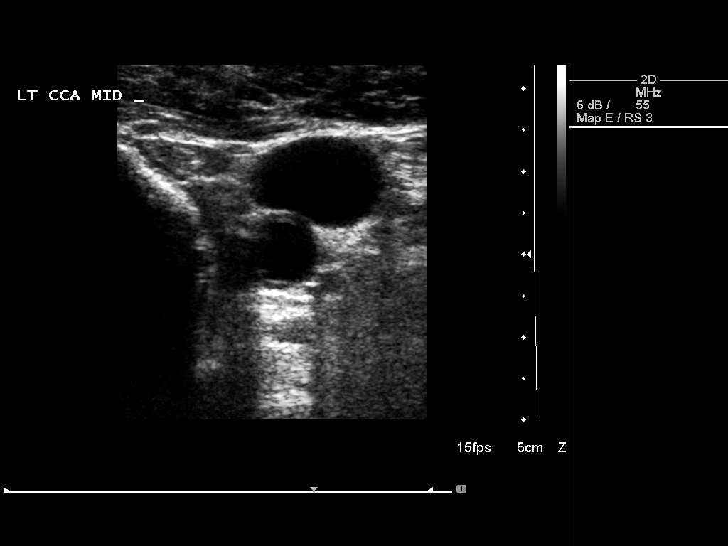
[im 36/55]
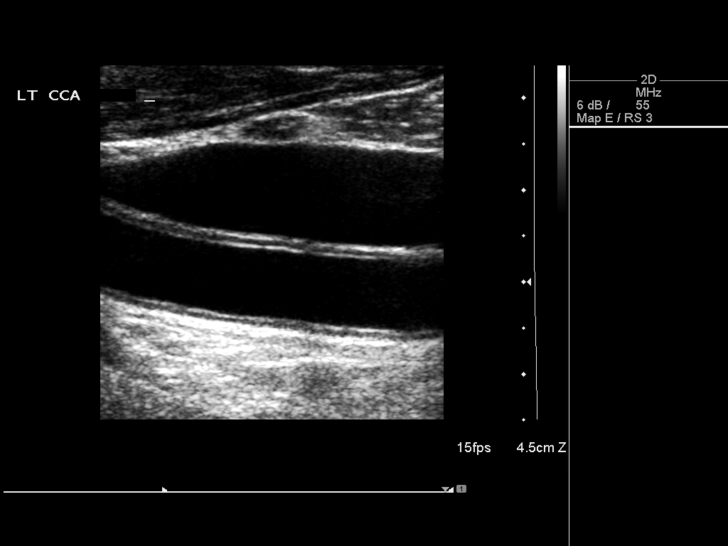
[im 40/55]
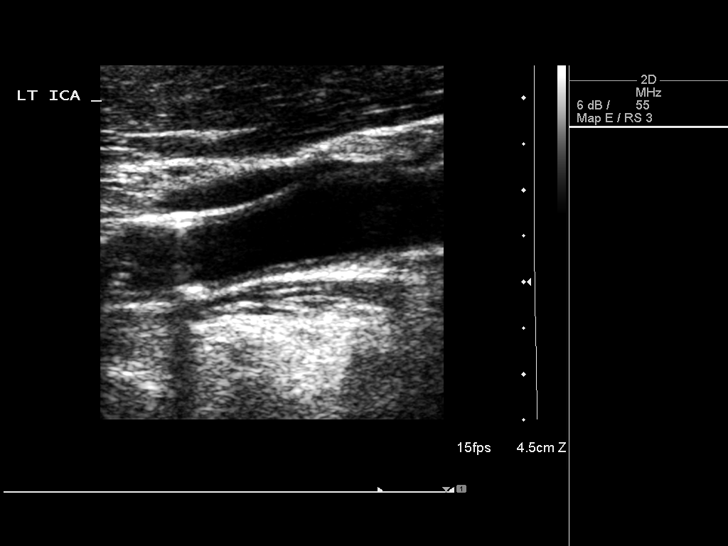
[im 45/55]
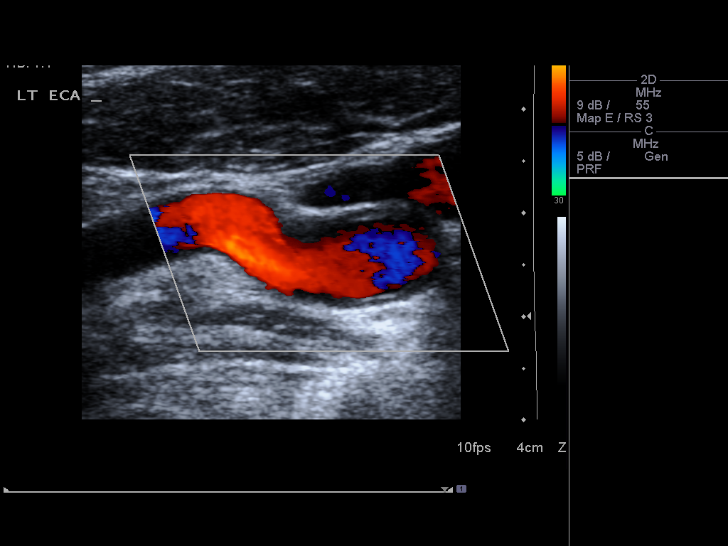
[im 50/55]
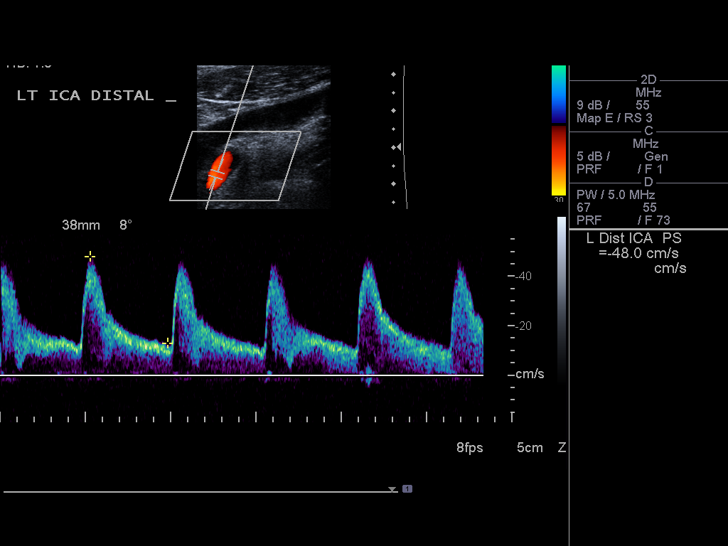
[im 55/55]
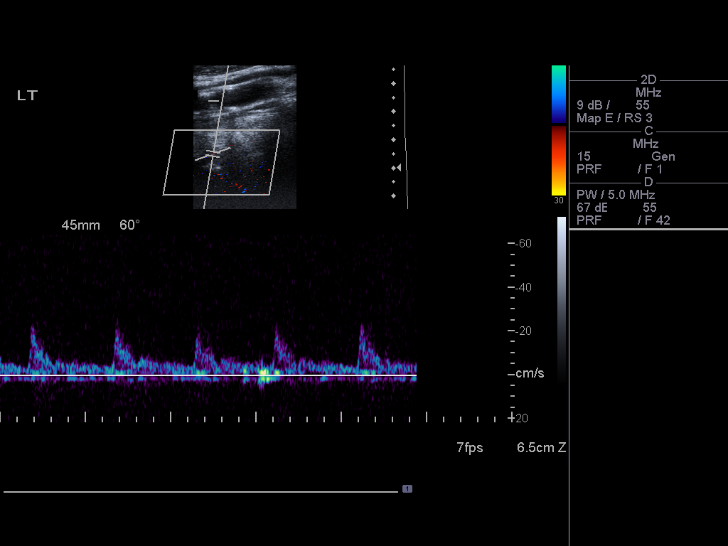

[13 of 24 positions shown; findings below may reference images not displayed]

FINDINGS: Criteria: Quantification of carotid stenosis is based on velocity
parameters that correlate the residual internal carotid diameter
with NASCET-based stenosis levels, using the diameter of the distal
internal carotid lumen as the denominator for stenosis measurement.

The following velocity measurements were obtained:

RIGHT

ICA:  58/7 cm/sec

CCA:  86/6 cm/sec

SYSTOLIC ICA/CCA RATIO:

DIASTOLIC ICA/CCA RATIO:

ECA:  88 cm/sec

LEFT

ICA:  60/16 cm/sec

CCA:  72/11 cm/sec

SYSTOLIC ICA/CCA RATIO:

DIASTOLIC ICA/CCA RATIO:

ECA:  85 cm/sec

RIGHT CAROTID ARTERY: Tortuous anatomy. Focal partially calcified
atherosclerotic plaque in the proximal internal carotid artery. By
peak systolic velocity criteria the estimated stenosis remains less
than 50%.

RIGHT VERTEBRAL ARTERY:  Patent with normal antegrade flow.

LEFT CAROTID ARTERY: Minimal heterogeneous atherosclerotic plaque in
the left internal carotid artery. The vessel is slightly tortuous
distally. By peak systolic velocity criteria the estimated stenosis
remains less than 50%.

LEFT VERTEBRAL ARTERY:  Patent with normal antegrade flow.
IMPRESSION: 1. Mild (1-49%) stenosis proximal right internal carotid artery
secondary to focal partially calcified atherosclerotic plaque.
2. Mild (1-49%) stenosis proximal and mid left internal carotid
artery secondary to minimal smooth heterogeneous atherosclerotic
plaque.
3. Vertebral arteries are patent with normal antegrade flow.

## 2016-09-06 ENCOUNTER — Other Ambulatory Visit: Payer: Self-pay | Admitting: Cardiology

## 2016-09-06 DIAGNOSIS — I739 Peripheral vascular disease, unspecified: Secondary | ICD-10-CM

## 2016-09-07 ENCOUNTER — Ambulatory Visit (HOSPITAL_COMMUNITY): Payer: Medicare Other

## 2016-09-14 ENCOUNTER — Ambulatory Visit (HOSPITAL_COMMUNITY)
Admission: RE | Admit: 2016-09-14 | Discharge: 2016-09-14 | Disposition: A | Discharge status: Home / Self Care | Payer: Medicare Other | Source: Ambulatory Visit | Attending: Cardiology | Admitting: Cardiology

## 2016-09-14 ENCOUNTER — Other Ambulatory Visit: Payer: Self-pay | Admitting: Cardiology

## 2016-09-14 DIAGNOSIS — I350 Nonrheumatic aortic (valve) stenosis: Secondary | ICD-10-CM

## 2016-09-14 DIAGNOSIS — I739 Peripheral vascular disease, unspecified: Secondary | ICD-10-CM

## 2016-09-19 ENCOUNTER — Other Ambulatory Visit: Payer: Self-pay

## 2016-09-19 DIAGNOSIS — I739 Peripheral vascular disease, unspecified: Secondary | ICD-10-CM

## 2016-09-30 ENCOUNTER — Other Ambulatory Visit (HOSPITAL_COMMUNITY): Payer: Self-pay | Admitting: Cardiology

## 2016-10-01 ENCOUNTER — Encounter: Payer: Self-pay | Admitting: Interventional Cardiology

## 2016-10-01 ENCOUNTER — Ambulatory Visit (INDEPENDENT_AMBULATORY_CARE_PROVIDER_SITE_OTHER): Payer: Medicare Other | Admitting: Interventional Cardiology

## 2016-10-01 VITALS — BP 136/70 | HR 57 | Ht 64.0 in | Wt 183.0 lb

## 2016-10-01 DIAGNOSIS — I2510 Atherosclerotic heart disease of native coronary artery without angina pectoris: Secondary | ICD-10-CM

## 2016-10-01 DIAGNOSIS — I10 Essential (primary) hypertension: Secondary | ICD-10-CM

## 2016-10-01 DIAGNOSIS — N183 Chronic kidney disease, stage 3 (moderate): Secondary | ICD-10-CM

## 2016-10-01 DIAGNOSIS — I739 Peripheral vascular disease, unspecified: Secondary | ICD-10-CM | POA: Diagnosis not present

## 2016-10-01 DIAGNOSIS — E0822 Diabetes mellitus due to underlying condition with diabetic chronic kidney disease: Secondary | ICD-10-CM

## 2016-10-01 DIAGNOSIS — I350 Nonrheumatic aortic (valve) stenosis: Secondary | ICD-10-CM

## 2016-10-01 DIAGNOSIS — E7800 Pure hypercholesterolemia, unspecified: Secondary | ICD-10-CM

## 2016-10-01 MED ORDER — LOSARTAN POTASSIUM-HCTZ 50-12.5 MG PO TABS: 1.0000 | ORAL_TABLET | Freq: Every day | ORAL | 3 refills | Status: DC

## 2016-10-01 NOTE — Patient Instructions (Signed)
Medication Instructions:  1) DISCONTINUE Aspirin 2) DISCONTINUE Cozaar 3) DISCONTINUE HCTZ (Hydrochlorothiazide)  4) START Hyzaar 50/12.5mg  once daily (Losartan/HCTZ)  Labwork: Your physician recommends that you return for lab work in: 4 weeks (BMET)   Testing/Procedures: Keep appointment for echocardiogram scheduled in March  Follow-Up: Your physician wants you to follow-up in: 6 months with Dr. Katrinka BlazingSmith. You will receive a reminder letter in the mail two months in advance. If you don't receive a letter, please call our office to schedule the follow-up appointment.   Any Other Special Instructions Will Be Listed Below (If Applicable).  Please contact our office if you have any episodes of passing out, chest pain or shortness of breath after starting your new medication.   If you need a refill on your cardiac medications before your next appointment, please call your pharmacy.

## 2016-10-01 NOTE — Progress Notes (Signed)
Cardiology Office Note    Date:  10/01/2016   ID:  Ronnie BilletWilliam G Boyd, DOB 07-03-1927, MRN [id number]  PCP:  Pincus SanesStacy J Burns, MD  Cardiologist: Lesleigh NoeHenry W Smith III, MD   Chief Complaint  Patient presents with  . Cardiac Valve Problem    History of Present Illness:  Ronnie BilletWilliam G Boyd is a 83 y.o. male with history of CAD, with stents in the mid LAD and distal LAD, as well as a ramus intermedius stent. He has diffuse distal vessel disease in other territories. Other problems include moderate aortic stenosis, hypertension, diabetes, peripheral arterial disease, and obstructive sleep apnea.  The patient is being seen for the first time as an outpatient. He is switching over from Dr. Shirlee LatchMcLean. [facility] most [facility].  This Malczewski is doing well. He denies angina. He has not had syncope, dyspnea, or angina. He has not needed to use nitroglycerin. He denies palpitations. He has been able to do chores within his household without difficulty. He is accompanied by his wife who has no additional concerns to voice.  Past Medical History:  Diagnosis Date  . Allergic rhinitis   . Aortic stenosis    mild echo 7/14  . Borderline type 2 diabetes mellitus   . Coronary artery disease   . Degenerative joint disease   . Family history of adverse reaction to anesthesia    "daughter had problems when she was a teen; none since; it was related to asthma"  . GERD (gastroesophageal reflux disease)   . Hemorrhoids   . History of hiatal hernia    "dx'd years ago" (06/01/2015)  . Hypercholesteremia   . Hypertension   . OSA on CPAP since 05/23/2015  . Prostatitis    hx of  . PVD (peripheral vascular disease) (HCC)   . Stroke [facility](HCC)     Past Surgical History:  Procedure Laterality Date  . CARDIAC CATHETERIZATION [name] 03/18/2015   Procedure: Left Heart Cath and Coronary Angiography;  Surgeon: Laurey Moralealton S McLean, MD;  [facility];  Service: Cardiovascular;   Laterality: [name];  . CARDIAC CATHETERIZATION [name] 06/01/2015   Procedure: Coronary Stent Intervention;  Surgeon: Iran OuchMuhammad A Arida, MD;  Location: [facility] INVASIVE CV LAB;  Service: Cardiovascular;  Laterality: [name];  . CARDIAC CATHETERIZATION  11/11/2015  . CARDIAC CATHETERIZATION [name] 11/11/2015   Procedure: Intravascular Pressure Wire/FFR Study;  Surgeon: Tonny BollmanMichael Cooper, MD;  [facility];  Service: Cardiovascular;  Laterality: [name];  . CARPAL TUNNEL RELEASE Bilateral 2006-2007  . CATARACT EXTRACTION W/ INTRAOCULAR LENS  IMPLANT, BILATERAL Bilateral ~ 2003  . EXCISIONAL HEMORRHOIDECTOMY  1989    Current Medications: Outpatient Medications Prior to Visit  Medication Sig Dispense Refill  . alum & mag hydroxide-simeth (MAALOX/MYLANTA) 200-200-20 MG/5ML suspension Take 15 mLs by mouth every 6 (six) hours as needed for indigestion or heartburn.    Marland Kitchen. amLODipine (NORVASC) 10 MG tablet Take 1 tablet (10 mg total) by mouth daily. 180 tablet 3  . aspirin EC 81 MG tablet Take 81 mg by mouth daily.    . Azelastine-Fluticasone (DYMISTA) 137-50 MCG/ACT SUSP Place 1 spray into the nose daily as needed (allergies).    . clopidogrel (PLAVIX) 75 MG tablet TAKE 1 TABLET (75 MG TOTAL) BY MOUTH DAILY. 30 tablet 6  . Coenzyme Q10 200 MG capsule Take 200 mg by mouth daily.     . Garlic Oil 1000 MG CAPS Take 1 capsule by mouth daily.      .Marland Kitchen  isosorbide mononitrate (IMDUR) 60 MG 24 hr tablet TAKE 1 TABLET BY MOUTH EVERY DAY 90 tablet 3  . loratadine (CLARITIN) 10 MG tablet Take 10 mg by mouth daily as needed for rhinitis.     Marland Kitchen losartan (COZAAR) 25 MG tablet Take 1 tablet (25 mg total) by mouth daily. 90 tablet 3  . metoprolol tartrate (LOPRESSOR) 25 MG tablet TAKE 1 TABLET BY MOUTH TWICE A DAY 60 tablet 7  . Multiple Vitamins-Minerals (CENTRUM SILVER) tablet Take 1 tablet by mouth daily.      . nitroGLYCERIN (NITROSTAT) 0.4 MG SL tablet PLACE 1 TABLET (0.4 MG TOTAL) UNDER THE TONGUE EVERY 5 (FIVE) MINUTES AS  NEEDED FOR CHEST PAIN. 25 tablet 3  . Omega-3 Fatty Acids (FISH OIL) 1000 MG CAPS Take 1 capsule by mouth daily.      . pantoprazole (PROTONIX) 40 MG tablet TAKE 1 TABLET (40 MG TOTAL) BY MOUTH DAILY. 30 tablet 11  . pravastatin (PRAVACHOL) 40 MG tablet TAKE 1 TABLET (40 MG TOTAL) BY MOUTH EVERY EVENING. 90 tablet 3  . ranolazine (RANEXA) 500 MG 12 hr tablet Take 1 tablet (500 mg total) by mouth 2 (two) times daily. 60 tablet 11  . tamsulosin (FLOMAX) 0.4 MG CAPS capsule Take 0.4 mg by mouth daily.    Marland Kitchen telmisartan (MICARDIS) 40 MG tablet TAKE 1 TABLET (40 MG TOTAL) BY MOUTH DAILY. 90 tablet 3  . hydrochlorothiazide (HYDRODIURIL) 25 MG tablet TAKE 1/2 TABLET BY MOUTH DAILY (Patient not taking: Reported on 10/01/2016) 15 tablet 11   No facility-administered medications prior to visit.      Allergies:   Other   Social History   Social History  . Marital status: Married    Spouse name: [name]  . Number of children: [name]  . Years of education: [name]   Occupational History  . retired    Social History Main Topics  . Smoking status: Never Smoker  . Smokeless tobacco: Never Used  . Alcohol use No  . Drug use: No  . Sexual activity: Not Currently   Other Topics Concern  . None   Social History Narrative   Daughter is [name] Psychologist, sport and exercise at [employer])     Family History:  The patient's family history includes Diabetes in his brother; Diabetes (age of onset: 4) in his mother; Hypertension in his mother; Stroke (age of onset: 28) in his father.   ROS:   Please see the history of present illness.    Cough, otherwise no complaints.  All other systems reviewed and are negative.   PHYSICAL EXAM:   VS:  BP 136/70 (BP Location: Left Arm)   Pulse (!) 57   Ht 5\' 4"  (1.626 m)   Wt 183 lb (83 kg)   BMI 31.41 kg/m    GEN: Well nourished, well developed, in no acute distress  HEENT: normal  Neck: no JVD, carotid bruits, or masses Cardiac: RRR, rubs, or gallops,no edema  . There is a 3/6 crescendo decrescendo murmur compatible with aortic stenosis heard at the right upper sternal border. No diastolic murmurs heard. Respiratory:  clear to auscultation bilaterally, normal work of breathing GI: soft, nontender, nondistended, + BS MS: no deformity or atrophy  Skin: warm and dry, no rash Neuro:  Alert and Oriented x 3, Strength and sensation are intact Psych: euthymic mood, full affect  Wt Readings from Last 3 Encounters:  10/01/16 183 lb (83 kg)  08/17/16 179 lb (81.2 kg)  06/06/16 179 lb (81.2  kg)      Studies/Labs Reviewed:   EKG:  EKG  A tracing performed on 08/17/2016 reveals inferior and lateral T wave inversion with evidence of LVH but otherwise unremarkable.  Recent Labs: 11/09/2015: ALT 14 04/10/2016: Hemoglobin 12.7; Platelets 163; TSH 0.60 08/27/2016: BUN 21; Creat 1.53; Potassium 4.5; Sodium 136   Lipid Panel    Component Value Date/Time   CHOL 180 08/17/2016 1552   CHOL 164 04/04/2015 1059   TRIG 64 08/17/2016 1552   TRIG 42 04/04/2015 1059   HDL 90 08/17/2016 1552   HDL 82 04/04/2015 1059   CHOLHDL 2.0 08/17/2016 1552   VLDL 13 08/17/2016 1552   LDLCALC 77 08/17/2016 1552   LDLCALC 74 04/04/2015 1059   LDLDIRECT 131.5 11/06/2011 1057    Additional studies/ records that were reviewed today include:  Peripheral arterial disease 04/14/2015: Examination Data Aorto-iliac atherosclerosis without focal stenosis. 30-49% bilateral SFA disease, without focal stenosis. Elevated ostial left PFA velocities. Occluded bilateral anterior tibial and left posterior tibial arteries, two vessel run-off on right and one vessel run-off on the left. See Arterial Doppler report. Suggest consult if clinically indicated.  Cardiac catheterization 11/11/15: Diagnostic Diagram       Echocardiogram 11/2015: Study Conclusions  - Left ventricle: The cavity size was normal. Wall thickness was   normal. Systolic function was vigorous. The estimated  ejection   fraction was in the range of 65% to 70%. Wall motion was normal;   there were no regional wall motion abnormalities. - Aortic valve: Trileaflet; moderately thickened, moderately   calcified leaflets. There was moderate stenosis. There was mild   regurgitation. Peak velocity (S): 362 cm/s. Mean gradient (S): 28   mm Hg.  Impressions:  - Mild increase in aortic stenosis since last ECHO.  ASSESSMENT:    1. Nonrheumatic aortic valve stenosis   2. Coronary artery disease involving native coronary artery of native heart without angina pectoris   3. Peripheral vascular disease (HCC)   4. Essential hypertension   5. Diabetes mellitus due to underlying condition with stage 3 chronic kidney disease, unspecified long term insulin use status (HCC)   6. HYPERCHOLESTEROLEMIA      PLAN:  In order of problems listed above:  1. Six-month clinical follow-up. Echo will be done in March. Call if angina, syncope, or dyspnea. 2. Call if angina. We spent considerable time discussing the degree of coronary disease. 3. He has not complaining of claudication. We will encourage continued physical activity and follow clinically. 4. Excellent blood pressure control. We will consolidate his medical regimen by changing hydrochlorothiazide 25 mg per day and Cozaar 25 mg per day to Hyzaar 50/12.5 mg daily. A basic metabolic panel will need to be done 4 weeks later. 5. No change 6. Not addressed  This was a prolonged office visit. Prior functional tests including echocardiograms were personally reviewed. Discussion of natural history of aortic stenosis occurred. Warning concerning the development of angina, syncope, or dyspnea as carnal symptoms of aortic stenosis that would precipitate further evaluation. We discussed the possibility of future need for TAVR.  Medication Adjustments/Labs and Tests Ordered: Current medicines are reviewed at length with the patient today.  Concerns regarding medicines  are outlined above.  Medication changes, Labs and Tests ordered today are listed in the Patient Instructions below. There are no Patient Instructions on file for this visit.   Signed, Lesleigh Noe, MD  10/01/2016 3:43 PM    [facility] [address]  [address], [place], [place]  [address] Phone: [phone]; Fax: [phone]

## 2016-10-19 ENCOUNTER — Ambulatory Visit: Payer: Medicare Other | Admitting: Nurse Practitioner

## 2016-10-19 ENCOUNTER — Other Ambulatory Visit: Payer: Self-pay

## 2016-10-19 ENCOUNTER — Other Ambulatory Visit: Payer: Self-pay | Admitting: *Deleted

## 2016-10-19 MED ORDER — RANOLAZINE ER 500 MG PO TB12: 500.0000 mg | ORAL_TABLET | Freq: Two times a day (BID) | ORAL | 3 refills | Status: DC

## 2016-10-19 MED ORDER — METOPROLOL TARTRATE 25 MG PO TABS: 25.0000 mg | ORAL_TABLET | Freq: Two times a day (BID) | ORAL | 0 refills | Status: DC

## 2016-10-22 ENCOUNTER — Encounter: Payer: Self-pay | Admitting: Neurology

## 2016-10-22 ENCOUNTER — Ambulatory Visit (INDEPENDENT_AMBULATORY_CARE_PROVIDER_SITE_OTHER): Payer: Medicare Other | Admitting: Neurology

## 2016-10-22 VITALS — BP 140/62 | HR 80 | Resp 16 | Ht 64.0 in | Wt 181.0 lb

## 2016-10-22 DIAGNOSIS — G4733 Obstructive sleep apnea (adult) (pediatric): Secondary | ICD-10-CM | POA: Diagnosis not present

## 2016-10-22 DIAGNOSIS — Z9989 Dependence on other enabling machines and devices: Secondary | ICD-10-CM

## 2016-10-22 NOTE — Progress Notes (Signed)
SLEEP MEDICINE CLINIC  Provider:  Melvyn Novas, M D  Referring Provider: Pincus Sanes, MD Primary Care Physician:  Pincus Sanes, MD  Chief Complaint  Patient presents with  . Follow-up    Rm 10. Patient states that he is doing ok with CPAP.     HPI:  Ronnie Boyd is a 83 y.o. male  Is seen here as a referral  from Dr. Merla Riches and Dr. Pearlean Brownie for a sleep evaluation.  Ronnie Boyd was seen during his hospitalization by my colleague, Dr. Delia Heady.  He had presented with a recent stroke that affected the thalamic region. One of his symptoms was leaning towards the left and weaker side. Doctor Pearlean Brownie  had added a baby aspirin to Plavix 75 mg daily , and had explained the goals of control of hypertension blood pressure and cholesterol. The patient was advised that he should partake in regular exercise, moderate diet.  The patient noted that he likes to walk but that he developed cramps in his thighs and both lower extremities , which he attributes to his cholesterol-lowering medication.  Change from Lipitor to Pravachol followed recently. This caused him to have myositis symptoms. I quote from  [facility]'s note dated 03-17-15 that the patient was noted by family to develop excessive sleepiness over the preceding 2 weeks ago . The symptoms lasted for several days before .Dr. Merla Riches also noticed that he was slightly off balance, identifying a fall risk. The wife noted his right eyelid to be droopy but there was no vision impairment.  The daughter notices this as well . Headaches or speech difficulties were denied.  His symptoms had improved and he was also not quite as sleepy anymore and walking better when he was finally receiving an outpatient MRI scan of the brain on 6-20 4-16 which showed a small acute right medial thalamic and midbrain infarct. This was measured to be a local. The MRI of the brain showed no large vessel stenosis and carotid ultrasound from 6-20 9-16 reviewed by Dr. Kathie Rhodes.  showed no significant stenosis in the extracranial parts. He had a transthoracic cardiac echo done which was normal.  Nuclear medicine scan was normal and he is therefore scheduled to undergo cardiac catheterization with Dr. Marca Ancona.  The patient reports that he has no trouble falling asleep when watching TV in his living room. He is has an easier time sleeping in a seated position or reclined position.  When he transfers to the bedroom he struggles to find a comfortable sleep position and to go to sleep. He describes his marital bedroom is cool, quiet and dark. He sleeps on his back on multiple pillows .The patient has to go to the bathroom at least 2-3 times at night and he is taking diabetic medications. Usually can reinitiate sleep quickly after bathroom break. He rises in the morning at about 6:30 AM spontaneously without alarm. He does not describe any morning headaches, he wakes up with a dry mouth. His wife states usually in bed he often returns to the bedroom a nap for another hour. Does not drink any caffeinated beverages. He is retired from his career job but he does keep his  hobby of lawn and garden care.He takes naps, after lunch, for 1 hour . He can easily fall asleep in daytime. He takes an after dinner nap daily. Total sleep time over 24 hours is about 8 hours. He no history of ENT, neck of facial surgery.  Denies falling asleep  driving.  He feels back to baseline.  Interval history, dated 05-23-15 ; Ronnie Boyd underwent a sleep study was split night protocol on 04-19-15 . He  had endorsed the Epworth sleepiness score in the sleep lab at only 3 points but previously in the office endorsed 11 points.  He was only asleep for about 50% of the recorded time for the diagnostic part of the study.  He was diagnosed with moderate to severe apnea at an AHI of 28.6 and severe upper airway restriction with an RDI of 55.6. In supine sleep his AHI was 30.4 and unfortunately REM sleep was not recorded.  His heart rate varied between 42 and 55. The patient was titrated to CPAP and the AHI became 0.0 under 9 cm water pressure. Now he was able to reach REM sleep AHI in rem sleep became 0 as well. Recommended to start the patient on CPAP with a 9 cm water pressure setting and a nasal mask. Heated humidity should be added. We are here today to discuss the results of the recent study. He has not started CPAP.Marland Kitchen. His Epworth sleepiness score today is at 10 points.Ronnie Boyd reports that he is able to dream, and usually feels refreshed and restored in the morning when waking up. He was advised of the degree of apnea, the association with bradycardia, he did not have significant oxygen drops. My recommendation would still be a CPAP given that his RDI was excessively high in conjunction with a moderate degree of apnea. I also explained that the dental device could not be used in patients that have this degree of apnea.  07-20-15 [name] is here following up on his recent sleep study from 04-19-15. He is a meanwhile 83 year old gentleman who does not look his age. And he underwent a split-night polysomnography the baseline or diagnostic part revealed an AHI of 28.6 and an RDI of 55.6. Supine sleep accentuated the apnea further. He also had no periodic limb movements and frequent respiratory related arousals from snoring or struggling to get air. His heart rate was very low on average 42 bpm. CPAP was initiated at 5 and titrated to 10 cm water pressure. At 9 cm water the AHI became 0.0. The recommended to start on an AutoSet. This would treat the upper airway resistance as well as the apnea. I'm also able today to review a download and compliance data. Dated 07-18-15 the patient was 100% compliance for the last 30 days and 90% compliance for over 4 hours of consecutive use. The average time using his CPAP nightly 6 hours and 59 minutes. His AHI is 3.2 the pressure window is between 5 and 10 cm water with a 3 cm EPR. At this  setting he is excellently treated and I would not want to adjust or define 1 setting but leave him on his current machine the patient reports that the tears airflow is sometimes too strong. He would like to have a RAMP time set for about 4 minutes.  History from 10/22/2016, We are meeting today for a brief visit dedicated to CPAP compliance. The patient endorsed one point on the geriatric depression score of 15, fatigue severity score at 30 points, Epworth sleepiness scale at 5 points. All these are well within normal limits. His  compliance report is excellent 100% of all days of the last 30 days to use the machine 97% over 4 hours consecutively with an average of 7 hours 10 minutes, he is using an AutoSet between 5 and  10 cm water with 3 cm EPR, is 95th percentile pressure is 10 cm , and his residual AHI is 0.5,  Fazit-no adjustments necessary.   Review of Systems: Out of a complete 14 system review, the patient complains of only the following symptoms, and all other reviewed systems are negative. Snoring. CPAP helped feeling more rested. CAD - no chest pain, No CVA symptoms.  Epworth score 5 from 10 , Fatigue severity score  30 from 21  ,  Geriatric depression score at 1 point.      Social History   Social History  . Marital status: Married    Spouse name: [name]  . Number of children: [name]  . Years of education: [name]   Occupational History  . retired    Social History Main Topics  . Smoking status: Never Smoker  . Smokeless tobacco: Never Used  . Alcohol use No  . Drug use: No  . Sexual activity: Not Currently   Other Topics Concern  . Not on file   Social History Narrative   Daughter is [name] Psychologist, sport and exercise at [employer])    Family History  Problem Relation Age of Onset  . Hypertension Mother   . Diabetes Mother 63  . Stroke Father 63  . Arthritis    . Diabetes Brother     Past Medical History:  Diagnosis Date  . Allergic rhinitis   . Aortic stenosis     mild echo 7/14  . Borderline type 2 diabetes mellitus   . Coronary artery disease   . Degenerative joint disease   . Family history of adverse reaction to anesthesia    "daughter had problems when she was a teen; none since; it was related to asthma"  . GERD (gastroesophageal reflux disease)   . Hemorrhoids   . History of hiatal hernia    "dx'd years ago" (06/01/2015)  . Hypercholesteremia   . Hypertension   . OSA on CPAP since 05/23/2015  . Prostatitis    hx of  . PVD (peripheral vascular disease) (HCC)   . Stroke [facility])      Current Outpatient Prescriptions  Medication Sig Dispense Refill  . alum & mag hydroxide-simeth (MAALOX/MYLANTA) 200-200-20 MG/5ML suspension Take 15 mLs by mouth every 6 (six) hours as needed for indigestion or heartburn.    Marland Kitchen amLODipine (NORVASC) 10 MG tablet Take 1 tablet (10 mg total) by mouth daily. 180 tablet 3  . Azelastine-Fluticasone (DYMISTA) 137-50 MCG/ACT SUSP Place 1 spray into the nose daily as needed (allergies).    . clopidogrel (PLAVIX) 75 MG tablet TAKE 1 TABLET (75 MG TOTAL) BY MOUTH DAILY. 30 tablet 6  . Coenzyme Q10 200 MG capsule Take 200 mg by mouth daily.     . Garlic Oil 1000 MG CAPS Take 1 capsule by mouth daily.      . isosorbide mononitrate (IMDUR) 60 MG 24 hr tablet TAKE 1 TABLET BY MOUTH EVERY DAY 90 tablet 3  . loratadine (CLARITIN) 10 MG tablet Take 10 mg by mouth daily as needed for rhinitis.     Marland Kitchen losartan-hydrochlorothiazide (HYZAAR) 50-12.5 MG tablet Take 1 tablet by mouth daily. 90 tablet 3  . metoprolol tartrate (LOPRESSOR) 25 MG tablet Take 1 tablet (25 mg total) by mouth 2 (two) times daily. 180 tablet 0  . Multiple Vitamins-Minerals (CENTRUM SILVER) tablet Take 1 tablet by mouth daily.      . nitroGLYCERIN (NITROSTAT) 0.4 MG SL tablet PLACE 1 TABLET (0.4 MG TOTAL) UNDER THE  TONGUE EVERY 5 (FIVE) MINUTES AS NEEDED FOR CHEST PAIN. 25 tablet 3  . Omega-3 Fatty Acids (FISH OIL) 1000 MG CAPS Take 1 capsule by mouth daily.       . pantoprazole (PROTONIX) 40 MG tablet TAKE 1 TABLET (40 MG TOTAL) BY MOUTH DAILY. 30 tablet 11  . pravastatin (PRAVACHOL) 40 MG tablet TAKE 1 TABLET (40 MG TOTAL) BY MOUTH EVERY EVENING. 90 tablet 3  . ranolazine (RANEXA) 500 MG 12 hr tablet Take 1 tablet (500 mg total) by mouth 2 (two) times daily. 180 tablet 3  . tamsulosin (FLOMAX) 0.4 MG CAPS capsule Take 0.4 mg by mouth daily.    Marland Kitchen telmisartan (MICARDIS) 40 MG tablet TAKE 1 TABLET (40 MG TOTAL) BY MOUTH DAILY. 90 tablet 3   No current facility-administered medications for this visit.     Allergies as of 10/22/2016 - Review Complete 10/22/2016  Allergen Reaction Noted  . Other  06/06/2016    Vitals: BP 140/62   Pulse 80   Resp 16   Ht 5\' 4"  (1.626 m)   Wt 181 lb (82.1 kg)   BMI 31.07 kg/m  Last Weight:  Wt Readings from Last 1 Encounters:  10/22/16 181 lb (82.1 kg)       Last Height:   Ht Readings from Last 1 Encounters:  10/22/16 5\' 4"  (1.626 m)    Physical exam:  General: The patient is awake, alert and appears not in acute distress. The patient is well groomed. Head: Normocephalic, atraumatic. Neck is supple. Mallampati 4 , large tongue and retrognathia.  neck circumference:18. Nasal airflow unrestricted, No TMJ .  Neurologic exam :The patient is awake and alert, oriented to place and time.   Memory subjective  described as intact. There is a normal attention span & concentration ability.  Speech is fluent without  dysarthria, dysphonia or aphasia. Mood and affect are appropriate.  Cranial nerves:sense of smell declined, taste intact.  Pupils are equal and briskly reactive to light. Extraocular movements  in vertical and horizontal planes intact and without nystagmus. Visual fields by finger perimetry are intact. Hearing to finger rub intact.  Facial sensation intact to fine touch. Facial motor strength is symmetric and tongue and uvula move midline.  Assessment:  After physical and neurologic examination,  review of laboratory studies, imaging, neurophysiology testing and pre-existing records, assessment is   His sleep study clearly indicated obstructive sleep apnea with an AHI  of 28.6. His RDI was 55.6- much higher.  I would like for the patient to start with CPAP using a small nasal mask at night. [facility]. He has excellent compliance.  He will follow for sleep clinic q 12 month.  OSA, discussion of  the best treatment options for Mr. Lafavor and the goal of preventing a new stroke in the future.  Mr. And Mrs. Sheckler live in a private residence and has Housekeeping help.     Porfirio Mylar Crimson Dubberly MD  10/22/2016

## 2016-10-23 ENCOUNTER — Other Ambulatory Visit: Payer: Self-pay | Admitting: Neurology

## 2016-10-24 ENCOUNTER — Other Ambulatory Visit: Payer: Self-pay | Admitting: Cardiology

## 2016-10-29 ENCOUNTER — Other Ambulatory Visit: Payer: Medicare Other | Admitting: *Deleted

## 2016-10-29 DIAGNOSIS — I2510 Atherosclerotic heart disease of native coronary artery without angina pectoris: Secondary | ICD-10-CM

## 2016-10-29 DIAGNOSIS — I10 Essential (primary) hypertension: Secondary | ICD-10-CM

## 2016-10-29 DIAGNOSIS — I350 Nonrheumatic aortic (valve) stenosis: Secondary | ICD-10-CM

## 2016-10-30 LAB — BASIC METABOLIC PANEL
BUN / CREAT RATIO: 13 (ref 10–24)
BUN: 18 mg/dL (ref 8–27)
CO2: 24 mmol/L (ref 18–29)
CREATININE: 1.41 mg/dL — AB (ref 0.76–1.27)
Calcium: 9.3 mg/dL (ref 8.6–10.2)
Chloride: 101 mmol/L (ref 96–106)
GFR calc non Af Amer: 44 mL/min/{1.73_m2} — ABNORMAL LOW (ref >59)
GFR, EST AFRICAN AMERICAN: 51 mL/min/{1.73_m2} — AB (ref >59)
GLUCOSE: 84 mg/dL (ref 65–99)
Potassium: 4.5 mmol/L (ref 3.5–5.2)
SODIUM: 140 mmol/L (ref 134–144)

## 2016-11-09 ENCOUNTER — Encounter: Payer: Medicare Other | Admitting: Internal Medicine

## 2016-11-13 ENCOUNTER — Encounter: Payer: Medicare Other | Admitting: Internal Medicine

## 2016-12-05 ENCOUNTER — Ambulatory Visit (HOSPITAL_COMMUNITY): Payer: Medicare Other | Attending: Cardiovascular Disease

## 2016-12-05 ENCOUNTER — Other Ambulatory Visit: Payer: Self-pay

## 2016-12-05 DIAGNOSIS — I5189 Other ill-defined heart diseases: Secondary | ICD-10-CM | POA: Insufficient documentation

## 2016-12-05 DIAGNOSIS — I351 Nonrheumatic aortic (valve) insufficiency: Secondary | ICD-10-CM | POA: Insufficient documentation

## 2016-12-05 DIAGNOSIS — I350 Nonrheumatic aortic (valve) stenosis: Secondary | ICD-10-CM | POA: Diagnosis present

## 2016-12-05 DIAGNOSIS — I739 Peripheral vascular disease, unspecified: Secondary | ICD-10-CM | POA: Insufficient documentation

## 2016-12-05 NOTE — Progress Notes (Signed)
Subjective:    Patient ID: Ronnie Boyd, male    DOB: 09/04/1927, 83 y.o.   MRN: [id number]  HPI He is here for a physical exam.   He is eating a low sugar diet, but does try to avoid too many carbohydrates.    He is not exercising like he should.  He walks a little.    He has no concerns.   Medications and allergies reviewed with patient and updated if appropriate.  Patient Active Problem List   Diagnosis Date Noted  . Urinary frequency 04/16/2016  . Coronary artery disease involving native coronary artery   . Diabetes (HCC) 11/09/2015  . OSA on CPAP 07/20/2015  . History of stroke, lacunar 2016 03/24/2015  . Unspecified sinusitis (chronic) 07/07/2013  . Aortic stenosis, moderate 2017 01/08/2011  . HYPERCHOLESTEROLEMIA 12/01/2007  . Essential hypertension 12/01/2007  . Peripheral vascular disease (HCC) 12/01/2007  . HEMORRHOIDS 12/01/2007  . ALLERGIC RHINITIS 12/01/2007  . GERD 12/01/2007  . DEGENERATIVE JOINT DISEASE 12/01/2007    Current Outpatient Prescriptions on File Prior to Visit  Medication Sig Dispense Refill  . alum & mag hydroxide-simeth (MAALOX/MYLANTA) 200-200-20 MG/5ML suspension Take 15 mLs by mouth every 6 (six) hours as needed for indigestion or heartburn.    . Azelastine-Fluticasone (DYMISTA) 137-50 MCG/ACT SUSP Place 1 spray into the nose daily as needed (allergies).    . clopidogrel (PLAVIX) 75 MG tablet TAKE 1 TABLET (75 MG TOTAL) BY MOUTH DAILY. 90 tablet 3  . Coenzyme Q10 200 MG capsule Take 200 mg by mouth daily.     . Garlic Oil 1000 MG CAPS Take 1 capsule by mouth daily.      . isosorbide mononitrate (IMDUR) 60 MG 24 hr tablet TAKE 1 TABLET BY MOUTH EVERY DAY 90 tablet 3  . loratadine (CLARITIN) 10 MG tablet Take 10 mg by mouth daily as needed for rhinitis.     Marland Kitchen losartan-hydrochlorothiazide (HYZAAR) 50-12.5 MG tablet Take 1 tablet by mouth daily. 90 tablet 3  . metoprolol tartrate (LOPRESSOR) 25 MG tablet Take 1 tablet (25 mg total) by  mouth 2 (two) times daily. 180 tablet 0  . Multiple Vitamins-Minerals (CENTRUM SILVER) tablet Take 1 tablet by mouth daily.      . nitroGLYCERIN (NITROSTAT) 0.4 MG SL tablet PLACE 1 TABLET (0.4 MG TOTAL) UNDER THE TONGUE EVERY 5 (FIVE) MINUTES AS NEEDED FOR CHEST PAIN. 25 tablet 3  . Omega-3 Fatty Acids (FISH OIL) 1000 MG CAPS Take 1 capsule by mouth daily.      . pantoprazole (PROTONIX) 40 MG tablet TAKE 1 TABLET (40 MG TOTAL) BY MOUTH DAILY. 30 tablet 11  . pravastatin (PRAVACHOL) 40 MG tablet TAKE 1 TABLET (40 MG TOTAL) BY MOUTH EVERY EVENING. 90 tablet 1  . ranolazine (RANEXA) 500 MG 12 hr tablet Take 1 tablet (500 mg total) by mouth 2 (two) times daily. 180 tablet 3  . tamsulosin (FLOMAX) 0.4 MG CAPS capsule Take 0.4 mg by mouth daily.    Marland Kitchen telmisartan (MICARDIS) 40 MG tablet TAKE 1 TABLET (40 MG TOTAL) BY MOUTH DAILY. 90 tablet 3  . amLODipine (NORVASC) 10 MG tablet Take 1 tablet (10 mg total) by mouth daily. 180 tablet 3   No current facility-administered medications on file prior to visit.     Past Medical History:  Diagnosis Date  . Allergic rhinitis   . Aortic stenosis    mild echo 7/14  . Borderline type 2 diabetes mellitus   . Coronary artery  disease   . Degenerative joint disease   . Family history of adverse reaction to anesthesia    "daughter had problems when she was a teen; none since; it was related to asthma"  . GERD (gastroesophageal reflux disease)   . Hemorrhoids   . History of hiatal hernia    "dx'd years ago" (06/01/2015)  . Hypercholesteremia   . Hypertension   . OSA on CPAP since 05/23/2015  . Prostatitis    hx of  . PVD (peripheral vascular disease) (HCC)   . Stroke [facility](HCC)     Past Surgical History:  Procedure Laterality Date  . CARDIAC CATHETERIZATION [name] 03/18/2015   Procedure: Left Heart Cath and Coronary Angiography;  Surgeon: Laurey Moralealton S McLean, MD;  [facility];  Service: Cardiovascular;  Laterality: [name];  . CARDIAC CATHETERIZATION  [name] 06/01/2015   Procedure: Coronary Stent Intervention;  Surgeon: Iran OuchMuhammad A Arida, MD;  Location: [facility] INVASIVE CV LAB;  Service: Cardiovascular;  Laterality: [name];  . CARDIAC CATHETERIZATION  11/11/2015  . CARDIAC CATHETERIZATION [name] 11/11/2015   Procedure: Intravascular Pressure Wire/FFR Study;  Surgeon: Tonny BollmanMichael Cooper, MD;  [facility];  Service: Cardiovascular;  Laterality: [name];  . CARPAL TUNNEL RELEASE Bilateral 2006-2007  . CATARACT EXTRACTION W/ INTRAOCULAR LENS  IMPLANT, BILATERAL Bilateral ~ 2003  . EXCISIONAL HEMORRHOIDECTOMY  1989    Social History   Social History  . Marital status: Married    Spouse name: [name]  . Number of children: [name]  . Years of education: [name]   Occupational History  . retired    Social History Main Topics  . Smoking status: Never Smoker  . Smokeless tobacco: Never Used  . Alcohol use No  . Drug use: No  . Sexual activity: Not Currently   Other Topics Concern  . Not on file   Social History Narrative   Daughter is Mrs. Greer PickerelMcMillan Psychologist, sport and exercise(ACES director at [employer])    Family History  Problem Relation Age of Onset  . Hypertension Mother   . Diabetes Mother 7081  . Stroke Father 941  . Arthritis    . Diabetes Brother     Review of Systems  Constitutional: Negative for appetite change, chills, fatigue and fever.  Eyes: Negative for visual disturbance.  Respiratory: Positive for shortness of breath (with moderate exertion). Negative for cough and wheezing.   Cardiovascular: Positive for chest pain (with exertion only), palpitations (with exertion only) and leg swelling.  Gastrointestinal: Negative for abdominal pain, blood in stool, constipation, diarrhea and nausea.  Genitourinary: Negative for dysuria and hematuria.  Musculoskeletal: Positive for arthralgias (shoulder pain - arthritis). Negative for back pain.  Skin: Positive for color change (in ankles - darker in color). Negative for rash.  Neurological: Positive for  light-headedness (if he gets up quickly). Negative for dizziness and headaches.  Psychiatric/Behavioral: Negative for dysphoric mood and sleep disturbance. The patient is not nervous/anxious.        Objective:   Vitals:   12/06/16 0840  BP: (!) 112/58  Pulse: 70  Temp: 98.3 F (36.8 C)   Filed Weights   12/06/16 0840  Weight: 182 lb 1.9 oz (82.6 kg)   Body mass index is 31.26 kg/m.  Wt Readings from Last 3 Encounters:  12/06/16 182 lb 1.9 oz (82.6 kg)  10/22/16 181 lb (82.1 kg)  10/01/16 183 lb (83 kg)     Physical Exam Constitutional: He appears well-developed and well-nourished. No distress.  HENT:  Head: Normocephalic and atraumatic.  Right Ear: External ear normal.  Left Ear: External ear normal.  Mouth/Throat: Oropharynx is clear and moist.  Normal ear canals and TM b/l  Eyes: Conjunctivae and EOM are normal.  Neck: Neck supple. No tracheal deviation present. No thyromegaly present.  No carotid bruit  Cardiovascular: Normal rate, regular rhythm, normal heart sounds and intact distal pulses.  2/6 systolic murmur heard. Pulmonary/Chest: Effort normal and breath sounds normal. No respiratory distress. He has no wheezes. He has no rales.  Abdominal: Soft. Bowel sounds are normal. He exhibits no distension. There is no tenderness.  Genitourinary: deferred  Musculoskeletal: He exhibits no edema.  Lymphadenopathy:   He has no cervical adenopathy.  Skin: Skin is warm and dry. He is not diaphoretic.  Psychiatric: He has a normal mood and affect. His behavior is normal.         Assessment & Plan:    Physical exam: Screening blood work routine blood work up to date - will check a1c Immunizations  Up to date  Colonoscopy - no longer needed due to his age Eye exams  Up to date  EKG  Last done 12/17 by cardio Exercise - encouraged him to increase his exercise Weight - encouraged losing a little weight Skin no concerns Substance abuse  none  See Problem List for  Assessment and Plan of chronic medical problems.  FU in 6 months

## 2016-12-05 NOTE — Patient Instructions (Addendum)
Your a1c was checked today.    All other Health Maintenance issues reviewed.   All recommended immunizations and age-appropriate screenings are up-to-date or discussed.  No immunizations administered today.   Medications reviewed and updated.  No changes recommended at this time.   Please followup in 6 months    Health Maintenance, Male A healthy lifestyle and preventive care is important for your health and wellness. Ask your health care provider about what schedule of regular examinations is right for you. What should I know about weight and diet?  Eat a Healthy Diet  Eat plenty of vegetables, fruits, whole grains, low-fat dairy products, and lean protein.  Do not eat a lot of foods high in solid fats, added sugars, or salt. Maintain a Healthy Weight  Regular exercise can help you achieve or maintain a healthy weight. You should:  Do at least 150 minutes of exercise each week. The exercise should increase your heart rate and make you sweat (moderate-intensity exercise).  Do strength-training exercises at least twice a week. Watch Your Levels of Cholesterol and Blood Lipids  Have your blood tested for lipids and cholesterol every 5 years starting at 81 years of age. If you are at high risk for heart disease, you should start having your blood tested when you are 81 years old. You may need to have your cholesterol levels checked more often if:  Your lipid or cholesterol levels are high.  You are older than 81 years of age.  You are at high risk for heart disease. What should I know about cancer screening? Many types of cancers can be detected early and may often be prevented. Lung Cancer  You should be screened every year for lung cancer if:  You are a current smoker who has smoked for at least 30 years.  You are a former smoker who has quit within the past 15 years.  Talk to your health care provider about your screening options, when you should start screening, and  how often you should be screened. Colorectal Cancer  Routine colorectal cancer screening usually begins at 81 years of age and should be repeated every 5-10 years until you are 81 years old. You may need to be screened more often if early forms of precancerous polyps or small growths are found. Your health care provider may recommend screening at an earlier age if you have risk factors for colon cancer.  Your health care provider may recommend using home test kits to check for hidden blood in the stool.  A small camera at the end of a tube can be used to examine your colon (sigmoidoscopy or colonoscopy). This checks for the earliest forms of colorectal cancer. Prostate and Testicular Cancer  Depending on your age and overall health, your health care provider may do certain tests to screen for prostate and testicular cancer.  Talk to your health care provider about any symptoms or concerns you have about testicular or prostate cancer. Skin Cancer  Check your skin from head to toe regularly.  Tell your health care provider about any new moles or changes in moles, especially if:  There is a change in a mole's size, shape, or color.  You have a mole that is larger than a pencil eraser.  Always use sunscreen. Apply sunscreen liberally and repeat throughout the day.  Protect yourself by wearing long sleeves, pants, a wide-brimmed hat, and sunglasses when outside. What should I know about heart disease, diabetes, and high blood pressure?  If  you are 2418-81 years of age, have your blood pressure checked every 3-5 years. If you are 81 years of age or older, have your blood pressure checked every year. You should have your blood pressure measured twice-once when you are at a hospital or clinic, and once when you are not at a hospital or clinic. Record the average of the two measurements. To check your blood pressure when you are not at a hospital or clinic, you can use:  An automated blood  pressure machine at a pharmacy.  A home blood pressure monitor.  Talk to your health care provider about your target blood pressure.  If you are between 2345-81 years old, ask your health care provider if you should take aspirin to prevent heart disease.  Have regular diabetes screenings by checking your fasting blood sugar level.  If you are at a normal weight and have a low risk for diabetes, have this test once every three years after the age of 81.  If you are overweight and have a high risk for diabetes, consider being tested at a younger age or more often.  A one-time screening for abdominal aortic aneurysm (AAA) by ultrasound is recommended for men aged 65-75 years who are current or former smokers. What should I know about preventing infection? Hepatitis B  If you have a higher risk for hepatitis B, you should be screened for this virus. Talk with your health care provider to find out if you are at risk for hepatitis B infection. Hepatitis C  Blood testing is recommended for:  Everyone born from [id number] through 1963.  Anyone with known risk factors for hepatitis C. Sexually Transmitted Diseases (STDs)  You should be screened each year for STDs including gonorrhea and chlamydia if:  You are sexually active and are younger than 81 years of age.  You are older than 81 years of age and your health care provider tells you that you are at risk for this type of infection.  Your sexual activity has changed since you were last screened and you are at an increased risk for chlamydia or gonorrhea. Ask your health care provider if you are at risk.  Talk with your health care provider about whether you are at high risk of being infected with HIV. Your health care provider may recommend a prescription medicine to help prevent HIV infection. What else can I do?  Schedule regular health, dental, and eye exams.  Stay current with your vaccines (immunizations).  Do not use any tobacco  products, such as cigarettes, chewing tobacco, and e-cigarettes. If you need help quitting, ask your health care provider.  Limit alcohol intake to no more than 2 drinks per day. One drink equals 12 ounces of beer, 5 ounces of wine, or 1 ounces of hard liquor.  Do not use street drugs.  Do not share needles.  Ask your health care provider for help if you need support or information about quitting drugs.  Tell your health care provider if you often feel depressed.  Tell your health care provider if you have ever been abused or do not feel safe at home. This information is not intended to replace advice given to you by your health care provider. Make sure you discuss any questions you have with your health care provider. Document Released: 02/23/2008 Document Revised: 04/25/2016 Document Reviewed: 05/31/2015 Elsevier Interactive Patient Education  2017 [employer]

## 2016-12-06 ENCOUNTER — Ambulatory Visit (INDEPENDENT_AMBULATORY_CARE_PROVIDER_SITE_OTHER): Payer: Medicare Other | Admitting: Internal Medicine

## 2016-12-06 ENCOUNTER — Encounter: Payer: Self-pay | Admitting: Internal Medicine

## 2016-12-06 VITALS — BP 112/58 | HR 70 | Temp 98.3°F | Ht 64.0 in | Wt 182.1 lb

## 2016-12-06 DIAGNOSIS — E7800 Pure hypercholesterolemia, unspecified: Secondary | ICD-10-CM

## 2016-12-06 DIAGNOSIS — Z0000 Encounter for general adult medical examination without abnormal findings: Secondary | ICD-10-CM

## 2016-12-06 DIAGNOSIS — I10 Essential (primary) hypertension: Secondary | ICD-10-CM | POA: Diagnosis not present

## 2016-12-06 DIAGNOSIS — N183 Chronic kidney disease, stage 3 (moderate): Secondary | ICD-10-CM

## 2016-12-06 DIAGNOSIS — K219 Gastro-esophageal reflux disease without esophagitis: Secondary | ICD-10-CM | POA: Diagnosis not present

## 2016-12-06 DIAGNOSIS — E0822 Diabetes mellitus due to underlying condition with diabetic chronic kidney disease: Secondary | ICD-10-CM

## 2016-12-06 LAB — POCT GLYCOSYLATED HEMOGLOBIN (HGB A1C): Hemoglobin A1C: 5.7

## 2016-12-06 NOTE — Assessment & Plan Note (Signed)
GERD controlled Continue daily medication  

## 2016-12-06 NOTE — Assessment & Plan Note (Signed)
BP well controlled Current regimen effective and well tolerated Continue current medications at current doses  

## 2016-12-06 NOTE — Assessment & Plan Note (Signed)
Lipids controlled Continue statin- he did not tolerate other statins

## 2016-12-06 NOTE — Assessment & Plan Note (Signed)
Check a1c Low sugar / carb diet Stressed regular exercise, keeping weight down/weight loss 

## 2016-12-06 NOTE — Progress Notes (Signed)
Pre visit review using our clinic review tool, if applicable. No additional management support is needed unless otherwise documented below in the visit note. 

## 2017-01-10 ENCOUNTER — Encounter: Payer: Self-pay | Admitting: Nurse Practitioner

## 2017-01-10 ENCOUNTER — Ambulatory Visit (INDEPENDENT_AMBULATORY_CARE_PROVIDER_SITE_OTHER)
Admission: RE | Admit: 2017-01-10 | Discharge: 2017-01-10 | Disposition: A | Discharge status: Home / Self Care | Payer: Medicare Other | Source: Ambulatory Visit | Attending: Nurse Practitioner | Admitting: Nurse Practitioner

## 2017-01-10 ENCOUNTER — Other Ambulatory Visit: Payer: Medicare Other

## 2017-01-10 ENCOUNTER — Ambulatory Visit (INDEPENDENT_AMBULATORY_CARE_PROVIDER_SITE_OTHER): Payer: Medicare Other | Admitting: Nurse Practitioner

## 2017-01-10 VITALS — BP 144/68 | HR 92 | Temp 100.0°F | Ht 64.0 in | Wt 181.0 lb

## 2017-01-10 DIAGNOSIS — J069 Acute upper respiratory infection, unspecified: Secondary | ICD-10-CM

## 2017-01-10 DIAGNOSIS — R509 Fever, unspecified: Secondary | ICD-10-CM

## 2017-01-10 LAB — POCT INFLUENZA A/B
INFLUENZA B, POC: NEGATIVE
Influenza A, POC: NEGATIVE

## 2017-01-10 MED ORDER — FLUTICASONE PROPIONATE 50 MCG/ACT NA SUSP: 2.0000 | Freq: Every day | NASAL | 0 refills | Status: AC

## 2017-01-10 MED ORDER — LEVOFLOXACIN 500 MG PO TABS: 500.0000 mg | ORAL_TABLET | Freq: Every day | ORAL | 0 refills | Status: DC

## 2017-01-10 MED ORDER — OXYMETAZOLINE HCL 0.05 % NA SOLN: 1.0000 | Freq: Two times a day (BID) | NASAL | 0 refills | Status: DC

## 2017-01-10 MED ORDER — ALBUTEROL SULFATE HFA 108 (90 BASE) MCG/ACT IN AERS: 2.0000 | INHALATION_SPRAY | Freq: Four times a day (QID) | RESPIRATORY_TRACT | 0 refills | Status: AC | PRN

## 2017-01-10 MED ORDER — BENZONATATE 100 MG PO CAPS: 100.0000 mg | ORAL_CAPSULE | Freq: Three times a day (TID) | ORAL | 0 refills | Status: DC | PRN

## 2017-01-10 MED ORDER — GUAIFENESIN-DM 100-10 MG/5ML PO SYRP: 5.0000 mL | ORAL_SOLUTION | ORAL | 0 refills | Status: DC | PRN

## 2017-01-10 NOTE — Progress Notes (Signed)
Pre visit review using our clinic review tool, if applicable. No additional management support is needed unless otherwise documented below in the visit note. 

## 2017-01-10 NOTE — Patient Instructions (Addendum)
CXR indicates bronchtis. Due to presence of fever and multiple medical comobidites, also sent oral abx and proair inhaler  URI Instructions: Flonase and Afrin use: apply 1spray of afrin in each nare, wait , then apply 2sprays of flonase in each nare. Use both nasal spray consecutively x 3days, then flonase only for at least 14days.  Encourage adequate oral hydration.  Use over-the-counter  "cold" medicines  such as "Tylenol cold" , "Advil cold",  "Mucinex" or" Mucinex D"  for cough and congestion.  Avoid decongestants if you have high blood pressure. Use" Delsym" or" Robitussin" cough syrup varietis for cough.  You can use plain "Tylenol" or "Advi"l for fever, chills and achyness.   "Common cold" symptoms are usually triggered by a virus.  The antibiotics are usually not necessary. On average, a" viral cold" illness would take 4-7 days to resolve. Please, make an appointment if you are not better or if you're worse.

## 2017-01-10 NOTE — Progress Notes (Signed)
Subjective:  Patient ID: Ronnie Boyd, male    DOB: 1927-09-08  Age: 83 y.o. MRN: [id number]  CC: Cough (coughing yellow mucus for 2 days/runny nose,body ache,sore throat)  Cough  This is a new problem. The current episode started in the past 7 days. The problem has been rapidly worsening. The cough is productive of sputum. Associated symptoms include chills, a fever, headaches, myalgias, nasal congestion, postnasal drip, rhinorrhea and a sore throat. Pertinent negatives include no chest pain, shortness of breath or wheezing. The symptoms are aggravated by lying down and cold air. He has tried OTC cough suppressant for the symptoms. The treatment provided no relief.    Outpatient Medications Prior to Visit  Medication Sig Dispense Refill  . alum & mag hydroxide-simeth (MAALOX/MYLANTA) 200-200-20 MG/5ML suspension Take 15 mLs by mouth every 6 (six) hours as needed for indigestion or heartburn.    . Azelastine-Fluticasone (DYMISTA) 137-50 MCG/ACT SUSP Place 1 spray into the nose daily as needed (allergies).    . clopidogrel (PLAVIX) 75 MG tablet TAKE 1 TABLET (75 MG TOTAL) BY MOUTH DAILY. 90 tablet 3  . Coenzyme Q10 200 MG capsule Take 200 mg by mouth daily.     . Garlic Oil 1000 MG CAPS Take 1 capsule by mouth daily.      . isosorbide mononitrate (IMDUR) 60 MG 24 hr tablet TAKE 1 TABLET BY MOUTH EVERY DAY 90 tablet 3  . loratadine (CLARITIN) 10 MG tablet Take 10 mg by mouth daily as needed for rhinitis.     Marland Kitchen. losartan-hydrochlorothiazide (HYZAAR) 50-12.5 MG tablet Take 1 tablet by mouth daily. 90 tablet 3  . metoprolol tartrate (LOPRESSOR) 25 MG tablet Take 1 tablet (25 mg total) by mouth 2 (two) times daily. 180 tablet 0  . Multiple Vitamins-Minerals (CENTRUM SILVER) tablet Take 1 tablet by mouth daily.      . nitroGLYCERIN (NITROSTAT) 0.4 MG SL tablet PLACE 1 TABLET (0.4 MG TOTAL) UNDER THE TONGUE EVERY 5 (FIVE) MINUTES AS NEEDED FOR CHEST PAIN. 25 tablet 3  . Omega-3 Fatty Acids (FISH  OIL) 1000 MG CAPS Take 1 capsule by mouth daily.      . pantoprazole (PROTONIX) 40 MG tablet TAKE 1 TABLET (40 MG TOTAL) BY MOUTH DAILY. 30 tablet 11  . pravastatin (PRAVACHOL) 40 MG tablet TAKE 1 TABLET (40 MG TOTAL) BY MOUTH EVERY EVENING. 90 tablet 1  . ranolazine (RANEXA) 500 MG 12 hr tablet Take 1 tablet (500 mg total) by mouth 2 (two) times daily. 180 tablet 3  . tamsulosin (FLOMAX) 0.4 MG CAPS capsule Take 0.4 mg by mouth daily.    Marland Kitchen. telmisartan (MICARDIS) 40 MG tablet TAKE 1 TABLET (40 MG TOTAL) BY MOUTH DAILY. 90 tablet 3  . amLODipine (NORVASC) 10 MG tablet Take 1 tablet (10 mg total) by mouth daily. 180 tablet 3   No facility-administered medications prior to visit.     ROS See HPI  Objective:  BP (!) 144/68   Pulse 92   Temp 100 F (37.8 C)   Ht 5\' 4"  (1.626 m)   Wt 181 lb (82.1 kg)   SpO2 98%   BMI 31.07 kg/m   BP Readings from Last 3 Encounters:  01/10/17 (!) 144/68  12/06/16 (!) 112/58  10/22/16 140/62    Wt Readings from Last 3 Encounters:  01/10/17 181 lb (82.1 kg)  12/06/16 182 lb 1.9 oz (82.6 kg)  10/22/16 181 lb (82.1 kg)    Physical Exam  Constitutional: He is oriented  to person, place, and time. No distress.  HENT:  Right Ear: Tympanic membrane, external ear and ear canal normal.  Left Ear: Tympanic membrane and ear canal normal.  Nose: Mucosal edema and rhinorrhea present. Right sinus exhibits maxillary sinus tenderness and frontal sinus tenderness. Left sinus exhibits maxillary sinus tenderness and frontal sinus tenderness.  Mouth/Throat: Uvula is midline. Posterior oropharyngeal erythema present. No oropharyngeal exudate.  Eyes: No scleral icterus.  Neck: Normal range of motion. Neck supple.  Cardiovascular: Normal rate and regular rhythm.   Murmur heard. Pulmonary/Chest: Effort normal and breath sounds normal. No respiratory distress. He has no wheezes. He has no rales.  Lymphadenopathy:    He has cervical adenopathy.  Neurological: He is  alert and oriented to person, place, and time.  Vitals reviewed.   Lab Results  Component Value Date   WBC 10.5 04/10/2016   HGB 12.7 (L) 04/10/2016   HCT 38.4 (L) 04/10/2016   PLT 163 04/10/2016   GLUCOSE 84 10/29/2016   CHOL 180 08/17/2016   TRIG 64 08/17/2016   HDL 90 08/17/2016   LDLDIRECT 131.5 11/06/2011   LDLCALC 77 08/17/2016   ALT 14 11/09/2015   AST 19 11/09/2015   NA 140 10/29/2016   K 4.5 10/29/2016   CL 101 10/29/2016   CREATININE 1.41 (H) 10/29/2016   BUN 18 10/29/2016   CO2 24 10/29/2016   TSH 0.60 04/10/2016   PSA 0.76 11/23/2010   INR 1.22 11/08/2015   HGBA1C 5.7 12/06/2016    No results found.  Assessment & Plan:   Ronnie Boyd was seen today for cough.  Diagnoses and all orders for this visit:  Acute URI -     POCT Influenza A/B -     DG Chest 2 View; Future -     fluticasone (FLONASE) 50 MCG/ACT nasal spray; Place 2 sprays into both nostrils daily. -     oxymetazoline (AFRIN NASAL SPRAY) 0.05 % nasal spray; Place 1 spray into both nostrils 2 (two) times daily. Use only for 3days, then stop -     benzonatate (TESSALON) 100 MG capsule; Take 1 capsule (100 mg total) by mouth 3 (three) times daily as needed for cough. -     guaiFENesin-dextromethorphan (ROBITUSSIN DM) 100-10 MG/5ML syrup; Take 5 mLs by mouth every 4 (four) hours as needed for cough. -     Discontinue: levofloxacin (LEVAQUIN) 500 MG tablet; Take 1 tablet (500 mg total) by mouth daily. -     albuterol (PROVENTIL HFA;VENTOLIN HFA) 108 (90 Base) MCG/ACT inhaler; Inhale 2 puffs into the lungs every 6 (six) hours as needed for wheezing or shortness of breath. -     levofloxacin (LEVAQUIN) 500 MG tablet; Take 1 tablet (500 mg total) by mouth daily.  Fever and chills -     DG Chest 2 View; Future -     fluticasone (FLONASE) 50 MCG/ACT nasal spray; Place 2 sprays into both nostrils daily. -     oxymetazoline (AFRIN NASAL SPRAY) 0.05 % nasal spray; Place 1 spray into both nostrils 2 (two) times  daily. Use only for 3days, then stop -     benzonatate (TESSALON) 100 MG capsule; Take 1 capsule (100 mg total) by mouth 3 (three) times daily as needed for cough. -     guaiFENesin-dextromethorphan (ROBITUSSIN DM) 100-10 MG/5ML syrup; Take 5 mLs by mouth every 4 (four) hours as needed for cough. -     Discontinue: levofloxacin (LEVAQUIN) 500 MG tablet; Take 1 tablet (500 mg total)  by mouth daily. -     albuterol (PROVENTIL HFA;VENTOLIN HFA) 108 (90 Base) MCG/ACT inhaler; Inhale 2 puffs into the lungs every 6 (six) hours as needed for wheezing or shortness of breath. -     levofloxacin (LEVAQUIN) 500 MG tablet; Take 1 tablet (500 mg total) by mouth daily.   I am having Ronnie Boyd start on fluticasone, oxymetazoline, benzonatate, guaiFENesin-dextromethorphan, and albuterol. I am also having him maintain his CENTRUM SILVER, Fish Oil, Garlic Oil, Coenzyme Q10, loratadine, alum & mag hydroxide-simeth, Azelastine-Fluticasone, tamsulosin, pantoprazole, telmisartan, nitroGLYCERIN, amLODipine, isosorbide mononitrate, losartan-hydrochlorothiazide, ranolazine, metoprolol tartrate, clopidogrel, pravastatin, and levofloxacin.  Meds ordered this encounter  Medications  . fluticasone (FLONASE) 50 MCG/ACT nasal spray    Sig: Place 2 sprays into both nostrils daily.    Dispense:  16 g    Refill:  0    Order Specific Question:   Supervising Provider    Answer:   Tresa Garter [[id number]]  . oxymetazoline (AFRIN NASAL SPRAY) 0.05 % nasal spray    Sig: Place 1 spray into both nostrils 2 (two) times daily. Use only for 3days, then stop    Dispense:  30 mL    Refill:  0    Order Specific Question:   Supervising Provider    Answer:   Tresa Garter [[id number]]  . benzonatate (TESSALON) 100 MG capsule    Sig: Take 1 capsule (100 mg total) by mouth 3 (three) times daily as needed for cough.    Dispense:  20 capsule    Refill:  0    Order Specific Question:   Supervising Provider    Answer:   Tresa Garter [[id number]]  . guaiFENesin-dextromethorphan (ROBITUSSIN DM) 100-10 MG/5ML syrup    Sig: Take 5 mLs by mouth every 4 (four) hours as needed for cough.    Dispense:  118 mL    Refill:  0    Order Specific Question:   Supervising Provider    Answer:   Tresa Garter [[id number]]  . DISCONTD: levofloxacin (LEVAQUIN) 500 MG tablet    Sig: Take 1 tablet (500 mg total) by mouth daily.    Dispense:  7 tablet    Refill:  0    Order Specific Question:   Supervising Provider    Answer:   Tresa Garter [[id number]]  . albuterol (PROVENTIL HFA;VENTOLIN HFA) 108 (90 Base) MCG/ACT inhaler    Sig: Inhale 2 puffs into the lungs every 6 (six) hours as needed for wheezing or shortness of breath.    Dispense:  1 Inhaler    Refill:  0    Order Specific Question:   Supervising Provider    Answer:   Tresa Garter [[id number]]  . levofloxacin (LEVAQUIN) 500 MG tablet    Sig: Take 1 tablet (500 mg total) by mouth daily.    Dispense:  5 tablet    Refill:  0    Order Specific Question:   Supervising Provider    Answer:   Tresa Garter [[id number]]    Follow-up: No Follow-up on file.  Alysia Penna, NP

## 2017-01-16 ENCOUNTER — Encounter: Payer: Self-pay | Admitting: Internal Medicine

## 2017-01-16 ENCOUNTER — Ambulatory Visit (INDEPENDENT_AMBULATORY_CARE_PROVIDER_SITE_OTHER): Payer: Medicare Other | Admitting: Internal Medicine

## 2017-01-16 VITALS — BP 120/60 | HR 65 | Temp 98.0°F | Resp 16 | Wt 180.0 lb

## 2017-01-16 DIAGNOSIS — J209 Acute bronchitis, unspecified: Secondary | ICD-10-CM

## 2017-01-16 NOTE — Progress Notes (Signed)
Subjective:    Patient ID: Margie BilletWilliam G Cumpian, male    DOB: 12/17/1926, 83 y.o.   MRN: [id number]  HPI He is here for an acute visit.   He saw Claris GowerCharlotte 6 days ago for cold symptoms that started one week prior.  He was diagnosed with an acute URI and was prescribed: flonase, afrin, tessalon perles, robitussin, albuterol, and levaquin.  He completed the levaquin two days ago.  A chest xray showed mild bronchitic changes.   CXR 01/10/17: IMPRESSION: Interval mild bronchitic changes.  Stable mild cardiomegaly.  Since finshing the antibiotic he has had a slight chest tightness.  The cough is not as bad as it was.  It is productive of white-cream sputum.  He has mild occasional wheeze, nasal congestion and PND.  He denies fever and SOB.  He has not restarted the claritin or mucinex, which he was taking prior to the cold.    His wife wanted him to come to see if he needed any additional medication.    Medications and allergies reviewed with patient and updated if appropriate.  Patient Active Problem List   Diagnosis Date Noted  . Urinary frequency 04/16/2016  . Coronary artery disease involving native coronary artery   . Diabetes (HCC) 11/09/2015  . OSA on CPAP 07/20/2015  . History of stroke, lacunar 2016 03/24/2015  . Aortic stenosis, moderate 2017 01/08/2011  . HYPERCHOLESTEROLEMIA 12/01/2007  . Essential hypertension 12/01/2007  . Peripheral vascular disease (HCC) 12/01/2007  . HEMORRHOIDS 12/01/2007  . ALLERGIC RHINITIS 12/01/2007  . GERD 12/01/2007  . DEGENERATIVE JOINT DISEASE 12/01/2007    Current Outpatient Prescriptions on File Prior to Visit  Medication Sig Dispense Refill  . albuterol (PROVENTIL HFA;VENTOLIN HFA) 108 (90 Base) MCG/ACT inhaler Inhale 2 puffs into the lungs every 6 (six) hours as needed for wheezing or shortness of breath. 1 Inhaler 0  . alum & mag hydroxide-simeth (MAALOX/MYLANTA) 200-200-20 MG/5ML suspension Take 15 mLs by mouth every 6 (six) hours as  needed for indigestion or heartburn.    . Azelastine-Fluticasone (DYMISTA) 137-50 MCG/ACT SUSP Place 1 spray into the nose daily as needed (allergies).    . benzonatate (TESSALON) 100 MG capsule Take 1 capsule (100 mg total) by mouth 3 (three) times daily as needed for cough. 20 capsule 0  . clopidogrel (PLAVIX) 75 MG tablet TAKE 1 TABLET (75 MG TOTAL) BY MOUTH DAILY. 90 tablet 3  . Coenzyme Q10 200 MG capsule Take 200 mg by mouth daily.     . fluticasone (FLONASE) 50 MCG/ACT nasal spray Place 2 sprays into both nostrils daily. 16 g 0  . Garlic Oil 1000 MG CAPS Take 1 capsule by mouth daily.      Marland Kitchen. guaiFENesin-dextromethorphan (ROBITUSSIN DM) 100-10 MG/5ML syrup Take 5 mLs by mouth every 4 (four) hours as needed for cough. 118 mL 0  . isosorbide mononitrate (IMDUR) 60 MG 24 hr tablet TAKE 1 TABLET BY MOUTH EVERY DAY 90 tablet 3  . loratadine (CLARITIN) 10 MG tablet Take 10 mg by mouth daily as needed for rhinitis.     Marland Kitchen. losartan-hydrochlorothiazide (HYZAAR) 50-12.5 MG tablet Take 1 tablet by mouth daily. 90 tablet 3  . metoprolol tartrate (LOPRESSOR) 25 MG tablet Take 1 tablet (25 mg total) by mouth 2 (two) times daily. 180 tablet 0  . Multiple Vitamins-Minerals (CENTRUM SILVER) tablet Take 1 tablet by mouth daily.      . nitroGLYCERIN (NITROSTAT) 0.4 MG SL tablet PLACE 1 TABLET (0.4 MG TOTAL) UNDER  THE TONGUE EVERY 5 (FIVE) MINUTES AS NEEDED FOR CHEST PAIN. 25 tablet 3  . Omega-3 Fatty Acids (FISH OIL) 1000 MG CAPS Take 1 capsule by mouth daily.      Marland Kitchen oxymetazoline (AFRIN NASAL SPRAY) 0.05 % nasal spray Place 1 spray into both nostrils 2 (two) times daily. Use only for 3days, then stop 30 mL 0  . pantoprazole (PROTONIX) 40 MG tablet TAKE 1 TABLET (40 MG TOTAL) BY MOUTH DAILY. 30 tablet 11  . pravastatin (PRAVACHOL) 40 MG tablet TAKE 1 TABLET (40 MG TOTAL) BY MOUTH EVERY EVENING. 90 tablet 1  . ranolazine (RANEXA) 500 MG 12 hr tablet Take 1 tablet (500 mg total) by mouth 2 (two) times daily. 180  tablet 3  . tamsulosin (FLOMAX) 0.4 MG CAPS capsule Take 0.4 mg by mouth daily.    Marland Kitchen telmisartan (MICARDIS) 40 MG tablet TAKE 1 TABLET (40 MG TOTAL) BY MOUTH DAILY. 90 tablet 3  . amLODipine (NORVASC) 10 MG tablet Take 1 tablet (10 mg total) by mouth daily. 180 tablet 3   No current facility-administered medications on file prior to visit.     Past Medical History:  Diagnosis Date  . Allergic rhinitis   . Aortic stenosis    mild echo 7/14  . Borderline type 2 diabetes mellitus   . Coronary artery disease   . Degenerative joint disease   . Family history of adverse reaction to anesthesia    "daughter had problems when she was a teen; none since; it was related to asthma"  . GERD (gastroesophageal reflux disease)   . Hemorrhoids   . History of hiatal hernia    "dx'd years ago" (06/01/2015)  . Hypercholesteremia   . Hypertension   . OSA on CPAP since 05/23/2015  . Prostatitis    hx of  . PVD (peripheral vascular disease) (HCC)   . Stroke [facility])     Past Surgical History:  Procedure Laterality Date  . CARDIAC CATHETERIZATION [name] 03/18/2015   Procedure: Left Heart Cath and Coronary Angiography;  Surgeon: Laurey Morale, MD;  Location: [facility] INVASIVE CV LAB;  Service: Cardiovascular;  Laterality: [name];  . CARDIAC CATHETERIZATION [name] 06/01/2015   Procedure: Coronary Stent Intervention;  Surgeon: Iran Ouch, MD;  Location: [facility] INVASIVE CV LAB;  Service: Cardiovascular;  Laterality: [name];  . CARDIAC CATHETERIZATION  11/11/2015  . CARDIAC CATHETERIZATION [name] 11/11/2015   Procedure: Intravascular Pressure Wire/FFR Study;  Surgeon: Tonny Bollman, MD;  Location: [facility] INVASIVE CV LAB;  Service: Cardiovascular;  Laterality: [name];  . CARPAL TUNNEL RELEASE Bilateral 2006-2007  . CATARACT EXTRACTION W/ INTRAOCULAR LENS  IMPLANT, BILATERAL Bilateral ~ 2003  . EXCISIONAL HEMORRHOIDECTOMY  1989    Social History   Social History  . Marital status: Married    Spouse name: [name]  . Number of children:  [name]  . Years of education: [name]   Occupational History  . retired    Social History Main Topics  . Smoking status: Never Smoker  . Smokeless tobacco: Never Used  . Alcohol use No  . Drug use: No  . Sexual activity: Not Currently   Other Topics Concern  . None   Social History Narrative   Daughter is [name] Psychologist, sport and exercise at [employer])    Family History  Problem Relation Age of Onset  . Hypertension Mother   . Diabetes Mother 40  . Stroke Father 67  . Arthritis    . Diabetes Brother     Review of Systems  Constitutional: Negative for appetite change, chills and fever.  HENT: Positive for congestion (mild) and postnasal drip. Negative for ear pain, sinus pain, sinus pressure and sore throat.   Respiratory: Positive for cough (productive of white-cream sputum), chest tightness and wheezing (a little bit). Negative for shortness of breath.   Cardiovascular: Negative for chest pain and palpitations.  Neurological: Negative for light-headedness and headaches.       Objective:   Vitals:   01/16/17 1122  BP: 120/60  Pulse: 65  Resp: 16  Temp: 98 F (36.7 C)   Filed Weights   01/16/17 1122  Weight: 180 lb (81.6 kg)   Body mass index is 30.9 kg/m.  Wt Readings from Last 3 Encounters:  01/16/17 180 lb (81.6 kg)  01/10/17 181 lb (82.1 kg)  12/06/16 182 lb 1.9 oz (82.6 kg)     Physical Exam GENERAL APPEARANCE: Appears stated age, well appearing, NAD EYES: conjunctiva clear, no icterus HEENT: bilateral tympanic membranes and ear canals normal, oropharynx with no erythema, no thyromegaly, trachea midline, no cervical or supraclavicular lymphadenopathy LUNGS: Clear to auscultation without wheeze or crackles, unlabored breathing, good air entry bilaterally HEART: Normal S1,S2 without murmurs EXTREMITIES: Without clubbing, cyanosis, or edema        Assessment & Plan:   See Problem List for Assessment and Plan of chronic medical problems.

## 2017-01-16 NOTE — Patient Instructions (Signed)
  No additional antibiotics are needed.  Restart your Claritin and mucinex.  Continue symptomatic treatment.  Keep u with lots of rest and fluids.     Call if no improvement

## 2017-01-16 NOTE — Assessment & Plan Note (Signed)
Treated with levaquin and supportive medications His lung exam today is normal - no wheeze or crackles Overall he is doing better and not additional antibiotics or steroids needed Continue other prescribed medication, restart claritin and mucinex Explained his residual symptoms are a result from the infection and will slowly improve -- if they do not he will call

## 2017-01-25 ENCOUNTER — Ambulatory Visit (INDEPENDENT_AMBULATORY_CARE_PROVIDER_SITE_OTHER): Payer: Medicare Other | Admitting: Nurse Practitioner

## 2017-01-25 ENCOUNTER — Encounter: Payer: Self-pay | Admitting: Nurse Practitioner

## 2017-01-25 VITALS — BP 116/62 | HR 51 | Temp 97.8°F | Ht 64.0 in | Wt 183.0 lb

## 2017-01-25 DIAGNOSIS — J309 Allergic rhinitis, unspecified: Secondary | ICD-10-CM | POA: Diagnosis not present

## 2017-01-25 DIAGNOSIS — R059 Cough, unspecified: Secondary | ICD-10-CM

## 2017-01-25 DIAGNOSIS — R05 Cough: Secondary | ICD-10-CM | POA: Diagnosis not present

## 2017-01-25 NOTE — Patient Instructions (Addendum)
Reassured patient that cough is improving. If absence of respiratory distress, and fever; no additional oral abx is needed at this time.  Use loratadine and flonase as prescribed. May use robitussin DM or delsym OTC for cough.

## 2017-01-25 NOTE — Progress Notes (Signed)
Subjective:  Patient ID: Ronnie Boyd, male    DOB: 02/09/1927  Age: 83 y.o. MRN: [id number]  CC: Cough (coughing with yellow mucus at times,wheezing)   Cough  This is a new problem. The current episode started 1 to 4 weeks ago. The problem has been unchanged. The cough is productive of sputum. Associated symptoms include nasal congestion, postnasal drip and rhinorrhea. Pertinent negatives include no chest pain, chills, ear congestion, ear pain, fever, headaches, heartburn, hemoptysis, myalgias, sore throat, shortness of breath, sweats, weight loss or wheezing. The symptoms are aggravated by lying down. He has tried OTC cough suppressant and prescription cough suppressant for the symptoms. The treatment provided mild relief. His past medical history is significant for bronchitis.    Outpatient Medications Prior to Visit  Medication Sig Dispense Refill  . albuterol (PROVENTIL HFA;VENTOLIN HFA) 108 (90 Base) MCG/ACT inhaler Inhale 2 puffs into the lungs every 6 (six) hours as needed for wheezing or shortness of breath. 1 Inhaler 0  . alum & mag hydroxide-simeth (MAALOX/MYLANTA) 200-200-20 MG/5ML suspension Take 15 mLs by mouth every 6 (six) hours as needed for indigestion or heartburn.    . clopidogrel (PLAVIX) 75 MG tablet TAKE 1 TABLET (75 MG TOTAL) BY MOUTH DAILY. 90 tablet 3  . Coenzyme Q10 200 MG capsule Take 200 mg by mouth daily.     . fluticasone (FLONASE) 50 MCG/ACT nasal spray Place 2 sprays into both nostrils daily. 16 g 0  . Garlic Oil 1000 MG CAPS Take 1 capsule by mouth daily.      . isosorbide mononitrate (IMDUR) 60 MG 24 hr tablet TAKE 1 TABLET BY MOUTH EVERY DAY 90 tablet 3  . loratadine (CLARITIN) 10 MG tablet Take 10 mg by mouth daily as needed for rhinitis.     Marland Kitchen losartan-hydrochlorothiazide (HYZAAR) 50-12.5 MG tablet Take 1 tablet by mouth daily. 90 tablet 3  . metoprolol tartrate (LOPRESSOR) 25 MG tablet Take 1 tablet (25 mg total) by mouth 2 (two) times daily. 180  tablet 0  . Multiple Vitamins-Minerals (CENTRUM SILVER) tablet Take 1 tablet by mouth daily.      . nitroGLYCERIN (NITROSTAT) 0.4 MG SL tablet PLACE 1 TABLET (0.4 MG TOTAL) UNDER THE TONGUE EVERY 5 (FIVE) MINUTES AS NEEDED FOR CHEST PAIN. 25 tablet 3  . Omega-3 Fatty Acids (FISH OIL) 1000 MG CAPS Take 1 capsule by mouth daily.      . pantoprazole (PROTONIX) 40 MG tablet TAKE 1 TABLET (40 MG TOTAL) BY MOUTH DAILY. 30 tablet 11  . pravastatin (PRAVACHOL) 40 MG tablet TAKE 1 TABLET (40 MG TOTAL) BY MOUTH EVERY EVENING. 90 tablet 1  . ranolazine (RANEXA) 500 MG 12 hr tablet Take 1 tablet (500 mg total) by mouth 2 (two) times daily. 180 tablet 3  . tamsulosin (FLOMAX) 0.4 MG CAPS capsule Take 0.4 mg by mouth daily.    Marland Kitchen telmisartan (MICARDIS) 40 MG tablet TAKE 1 TABLET (40 MG TOTAL) BY MOUTH DAILY. 90 tablet 3  . Azelastine-Fluticasone (DYMISTA) 137-50 MCG/ACT SUSP Place 1 spray into the nose daily as needed (allergies).    Marland Kitchen oxymetazoline (AFRIN NASAL SPRAY) 0.05 % nasal spray Place 1 spray into both nostrils 2 (two) times daily. Use only for 3days, then stop 30 mL 0  . amLODipine (NORVASC) 10 MG tablet Take 1 tablet (10 mg total) by mouth daily. 180 tablet 3  . benzonatate (TESSALON) 100 MG capsule Take 1 capsule (100 mg total) by mouth 3 (three) times daily as needed  for cough. (Patient not taking: Reported on 01/25/2017) 20 capsule 0  . guaiFENesin-dextromethorphan (ROBITUSSIN DM) 100-10 MG/5ML syrup Take 5 mLs by mouth every 4 (four) hours as needed for cough. (Patient not taking: Reported on 01/25/2017) 118 mL 0   No facility-administered medications prior to visit.     ROS See HPI  Objective:  BP 116/62   Pulse (!) 51   Temp 97.8 F (36.6 C)   Ht 5\' 4"  (1.626 m)   Wt 183 lb (83 kg)   SpO2 99%   BMI 31.41 kg/m   BP Readings from Last 3 Encounters:  01/25/17 116/62  01/16/17 120/60  01/10/17 (!) 144/68    Wt Readings from Last 3 Encounters:  01/25/17 183 lb (83 kg)  01/16/17 180  lb (81.6 kg)  01/10/17 181 lb (82.1 kg)    Physical Exam  Cardiovascular: Normal rate, regular rhythm and normal heart sounds.   Pulmonary/Chest: Effort normal and breath sounds normal.  Lymphadenopathy:    He has no cervical adenopathy.  Vitals reviewed.   Lab Results  Component Value Date   WBC 10.5 04/10/2016   HGB 12.7 (L) 04/10/2016   HCT 38.4 (L) 04/10/2016   PLT 163 04/10/2016   GLUCOSE 84 10/29/2016   CHOL 180 08/17/2016   TRIG 64 08/17/2016   HDL 90 08/17/2016   LDLDIRECT 131.5 11/06/2011   LDLCALC 77 08/17/2016   ALT 14 11/09/2015   AST 19 11/09/2015   NA 140 10/29/2016   K 4.5 10/29/2016   CL 101 10/29/2016   CREATININE 1.41 (H) 10/29/2016   BUN 18 10/29/2016   CO2 24 10/29/2016   TSH 0.60 04/10/2016   PSA 0.76 11/23/2010   INR 1.22 11/08/2015   HGBA1C 5.7 12/06/2016    Dg Chest 2 View  Result Date: 01/10/2017 CLINICAL DATA:  Pharyngitis. Myalgias. Cough and fever for the past 2 months. EXAM: CHEST  2 VIEW COMPARISON:  11/11/2015. FINDINGS: The cardiac silhouette remains mildly enlarged. Clear lungs with normal vascularity. Interval mild peribronchial thickening. Mild thoracic spine degenerative changes and mild scoliosis. IMPRESSION: Interval mild bronchitic changes.  Stable mild cardiomegaly. Electronically Signed   By: Beckie SaltsSteven  Reid M.D.   On: 01/10/2017 14:03    Assessment & Plan:   Ronnie Boyd was seen today for cough.  Diagnoses and all orders for this visit:  Cough  Allergic rhinitis, unspecified seasonality, unspecified trigger   I have discontinued Ronnie Boyd's Azelastine-Fluticasone, oxymetazoline, benzonatate, and guaiFENesin-dextromethorphan. I am also having him maintain his CENTRUM SILVER, Fish Oil, Garlic Oil, Coenzyme Q10, loratadine, alum & mag hydroxide-simeth, tamsulosin, pantoprazole, telmisartan, nitroGLYCERIN, amLODipine, isosorbide mononitrate, losartan-hydrochlorothiazide, ranolazine, metoprolol tartrate, clopidogrel, pravastatin,  fluticasone, and albuterol.  No orders of the defined types were placed in this encounter.   Follow-up: Return if symptoms worsen or fail to improve.  Alysia Pennaharlotte Perlita Forbush, NP

## 2017-03-09 ENCOUNTER — Other Ambulatory Visit: Payer: Self-pay | Admitting: Cardiology

## 2017-03-26 ENCOUNTER — Encounter: Payer: Self-pay | Admitting: Family Medicine

## 2017-03-26 ENCOUNTER — Ambulatory Visit (INDEPENDENT_AMBULATORY_CARE_PROVIDER_SITE_OTHER): Payer: Medicare Other | Admitting: Family Medicine

## 2017-03-26 VITALS — BP 122/62 | HR 71 | Ht 64.0 in | Wt 189.0 lb

## 2017-03-26 DIAGNOSIS — R600 Localized edema: Secondary | ICD-10-CM

## 2017-03-26 DIAGNOSIS — I10 Essential (primary) hypertension: Secondary | ICD-10-CM | POA: Diagnosis not present

## 2017-03-26 LAB — BRAIN NATRIURETIC PEPTIDE: Pro B Natriuretic peptide (BNP): 102 pg/mL — ABNORMAL HIGH (ref 0.0–100.0)

## 2017-03-26 LAB — BASIC METABOLIC PANEL
BUN: 22 mg/dL (ref 6–23)
CO2: 30 meq/L (ref 19–32)
Calcium: 9.3 mg/dL (ref 8.4–10.5)
Chloride: 102 mEq/L (ref 96–112)
Creatinine, Ser: 1.4 mg/dL (ref 0.40–1.50)
GFR: 61.56 mL/min (ref >60.00)
GLUCOSE: 105 mg/dL — AB (ref 70–99)
Potassium: 4.5 mEq/L (ref 3.5–5.1)
Sodium: 139 mEq/L (ref 135–145)

## 2017-03-26 LAB — TSH: TSH: 2.9 u[IU]/mL (ref 0.35–4.50)

## 2017-03-26 MED ORDER — FUROSEMIDE 20 MG PO TABS: 20.0000 mg | ORAL_TABLET | Freq: Every day | ORAL | 0 refills | Status: DC

## 2017-03-26 NOTE — Patient Instructions (Signed)
GET BACK ON YOUR CPAP-EVERY NIGHT DECREASE THE AMLODIPINE FROM 10 MG TO 5 MG Verify if you are taking both Telmisartan and Losartan/HCTZ Take Furosemide one each morning for 5 days. Make sure you get follow up with Dr Lawerance BachBurns in about one week Elevate legs frequently.

## 2017-03-26 NOTE — Progress Notes (Signed)
Subjective:     Patient ID: Ronnie Boyd, male   DOB: 25-May-1927, 83 y.o.   MRN: [id number]  HPI Patient seen as a work in in the absence of his primary provider with increasing bilateral leg edema over the past 10 days or so. He has multiple chronic problems including hypertension, aortic stenosis, coronary artery disease, obstructive sleep apnea, GERD, type 2 diabetes, history of cerebrovascular disease.  His wife had recent knee surgery with knee replacement and has been helping to look after her. He apparently quit using his CPAP several weeks ago. He has no history of heart failure. He had echocardiogram March 2018 ejection fraction 60-65%. Moderate aortic stenosis. Denies prior issues with bilateral leg edema. No orthopnea. No dyspnea with exertion. No chest pains. Swelling worse late in the day.  Medications reviewed. He has some confusion regarding several medications. Takes amlodipine 10 mg daily. He has both losartan HCTZ and telmisartan on his med list but is not sure if he is taking both.  Appears to have some chronic kidney disease with baseline creatinine around 1.4.  His weight is up about 6 pounds compared with May  Past Medical History:  Diagnosis Date  . Allergic rhinitis   . Aortic stenosis    mild echo 7/14  . Borderline type 2 diabetes mellitus   . Coronary artery disease   . Degenerative joint disease   . Family history of adverse reaction to anesthesia    "daughter had problems when she was a teen; none since; it was related to asthma"  . GERD (gastroesophageal reflux disease)   . Hemorrhoids   . History of hiatal hernia    "dx'd years ago" (06/01/2015)  . Hypercholesteremia   . Hypertension   . OSA on CPAP since 05/23/2015  . Prostatitis    hx of  . PVD (peripheral vascular disease) (HCC)   . Stroke [facility](HCC)    Past Surgical History:  Procedure Laterality Date  . CARDIAC CATHETERIZATION [name] 03/18/2015   Procedure: Left Heart Cath and Coronary Angiography;   Surgeon: Laurey Moralealton S McLean, MD;  [facility];  Service: Cardiovascular;  Laterality: [name];  . CARDIAC CATHETERIZATION [name] 06/01/2015   Procedure: Coronary Stent Intervention;  Surgeon: Iran OuchMuhammad A Arida, MD;  Location: [facility] INVASIVE CV LAB;  Service: Cardiovascular;  Laterality: [name];  . CARDIAC CATHETERIZATION  11/11/2015  . CARDIAC CATHETERIZATION [name] 11/11/2015   Procedure: Intravascular Pressure Wire/FFR Study;  Surgeon: Tonny BollmanMichael Cooper, MD;  [facility];  Service: Cardiovascular;  Laterality: [name];  . CARPAL TUNNEL RELEASE Bilateral 2006-2007  . CATARACT EXTRACTION W/ INTRAOCULAR LENS  IMPLANT, BILATERAL Bilateral ~ 2003  . EXCISIONAL HEMORRHOIDECTOMY  1989    reports that he has never smoked. He has never used smokeless tobacco. He reports that he does not drink alcohol or use drugs. family history includes Arthritis in his unknown relative; Diabetes in his brother; Diabetes (age of onset: 4781) in his mother; Hypertension in his mother; Stroke (age of onset: 4641) in his father. Allergies  Allergen Reactions  . Other     Environmental      Review of Systems  Constitutional: Positive for unexpected weight change. Negative for fatigue.  Eyes: Negative for visual disturbance.  Respiratory: Negative for cough, chest tightness and shortness of breath.   Cardiovascular: Positive for leg swelling. Negative for chest pain and palpitations.  Gastrointestinal: Negative for abdominal pain.  Endocrine: Negative for polydipsia and polyuria.  Neurological: Negative for dizziness, syncope, weakness, light-headedness  and headaches.       Objective:   Physical Exam  Constitutional: He is oriented to person, place, and time. He appears well-developed and well-nourished.  HENT:  Right Ear: External ear normal.  Left Ear: External ear normal.  Mouth/Throat: Oropharynx is clear and moist.  Eyes: Pupils are equal, round, and reactive to light.  Neck: Neck supple. No thyromegaly  present.  Cardiovascular: Normal rate and regular rhythm.   Pulmonary/Chest: Effort normal and breath sounds normal. No respiratory distress. He has no wheezes. He has no rales.  Musculoskeletal: He exhibits edema.  2+ pitting edema feet ankles and legs bilaterally  Neurological: He is alert and oriented to person, place, and time.       Assessment:     #1 Bilateral leg edema worsening of the past 10 days or so. Likely multifactorial with amlodipine therapy, diastolic dysfunction, poor compliance with CPAP.  No evidence for overt heart failure  #2  hypertension stable and at goal  #3 history of CAD    Plan:     -Get back on nightly use of CPAP -Elevate legs frequently -Consider daily weights -Reduce amlodipine to 5 mg until follow-up with primary -Short-term only use of furosemide 20 mg once daily for 5 days until follow-up with primary -Check labs with basic metabolic panel, BNP level, TSH -Reduce sodium intake -Have asked him to clarify medications when he gets home to make sure is not taking both telmisartan and losartan  Kristian Covey MD [facility]

## 2017-04-23 ENCOUNTER — Ambulatory Visit: Payer: Medicare Other | Admitting: Neurology

## 2017-05-07 ENCOUNTER — Encounter: Payer: Self-pay | Admitting: Neurology

## 2017-05-07 ENCOUNTER — Ambulatory Visit (INDEPENDENT_AMBULATORY_CARE_PROVIDER_SITE_OTHER): Payer: Medicare Other | Admitting: Neurology

## 2017-05-07 VITALS — BP 177/81 | HR 55 | Wt 182.6 lb

## 2017-05-07 DIAGNOSIS — I6990 Unspecified sequelae of unspecified cerebrovascular disease: Secondary | ICD-10-CM | POA: Diagnosis not present

## 2017-05-07 NOTE — Progress Notes (Signed)
[name] [address] [place]. [place] [address] [phone]       OFFICE FOLLOW UP VISIT NOTE  Ronnie Boyd Date of Birth:  April 07, 1927 Medical Record Number:  [id number]   Referring MD:  Ronnie Boyd  Reason for Referral:  Stroke  HPI: Ronnie Boyd is a pleasant 72 year African-American male who was noted by family to develop excessive sleepiness a few weeks ago. The symptoms lasted for several days before he saw his primary physician who also noticed that he was slightly off balance when walking. The wife and also noticed slight drooping of his right eyelid but the patient denied any diplopia, loss of vision, blurred vision, headache, speech difficulties. Over the last week or so his symptoms have improved and is no longer as sleepy and is walking much better. The patient had an outpatient MRI scan of the brain done on 03/04/15 which have personally reviewed and shows a small acute right medial thalamic/midbrain lacunar infarct. MRA of the brain shows no large vessel and the canal stenosis. Carotid ultrasound done on 03/09/15 reviewed by me shows no significant extracranial stenosis. Patient also had transthoracic echo done which was normal. He had a nuclear medicine scan done 5 admission for chest pain or shortness of breath which was abnormal and is scheduled to undergo cardiac catheterization tomorrow by Dr. Marca Boyd. He has no prior history of strokes, TIAs. He was started recently on aspirin 81 mg daily as well as protocol which has taken only for 4 weeks. He denies smoking or drinking excessive alcohol. There is no family history of strokes. Update 06/22/15 : He returns today for follow-up after last visit 3 months ago. He has had no recurrent stroke or TIA symptoms and is doing well from neurovascular standpoint. He had lipid profile checked 2 months ago and LDL cholesterol was 74 mg percent and hemoglobin A1c was 6.5. He has seen Dr. Vickey Boyd and been  diagnosed with sleep apnea and started using CPAP regularly which is helping him. He was found to have occlusive cardiac disease and underwent coronary angioplasty stenting by Dr. Kirke Boyd on 06/01/15 and is doing well after that. He is now on aspirin and Plavix and tolerating it well with only minor bruising but no bleeding. His blood pressure is well controlled and today it is 138/86. Update 05/07/2017 : He returns for follow-up after last visit with me nearly 2 years ago. He continues to do well and has not had recurrent stroke or TIA symptoms now for more than 2 years. He is tolerating Plavix well without bleeding or bruising. His blood pressure is usually well controlled but recently he discontinued his blood pressure medications since he thought he was taking too many medicines. He does have an appointment with his primary care physician tomorrow to really adjust his blood pressure medicines. It is elevated today at 177/81 in office. Patient has a history of statin intolerance. He wasn't protocoled for last 23 years and complains of almost daily cramps. In the past he was on Lipitor for a few years and also had similar muscle cramps. Patient has not had any recurrent stroke or TIA symptoms. He continues to see Dr. Vickey Boyd for his sleep apnea and states he is quite compliant with his CPAP every day. He has no new complaints today. ROS:   14 system review of systems is positive for leg swelling, constipation and all other systems negative PMH:  Past Medical History:  Diagnosis Date  . Allergic  rhinitis   . Aortic stenosis    mild echo 7/14  . Borderline type 2 diabetes mellitus   . Coronary artery disease   . Degenerative joint disease   . Family history of adverse reaction to anesthesia    "daughter had problems when she was a teen; none since; it was related to asthma"  . GERD (gastroesophageal reflux disease)   . Hemorrhoids   . History of hiatal hernia    "dx'd years ago" (06/01/2015)  .  Hypercholesteremia   . Hypertension   . OSA on CPAP since 05/23/2015  . Prostatitis    hx of  . PVD (peripheral vascular disease) (HCC)   . Stroke [facility])     Social History:  Social History   Social History  . Marital status: Married    Spouse name: [name]  . Number of children: [name]  . Years of education: [name]   Occupational History  . retired    Social History Main Topics  . Smoking status: Never Smoker  . Smokeless tobacco: Never Used  . Alcohol use No  . Drug use: No  . Sexual activity: Not Currently   Other Topics Concern  . Not on file   Social History Narrative   Daughter is Mrs. Ronnie Boyd Psychologist, sport and exercise at [employer])    Medications:   Current Outpatient Prescriptions on File Prior to Visit  Medication Sig Dispense Refill  . albuterol (PROVENTIL HFA;VENTOLIN HFA) 108 (90 Base) MCG/ACT inhaler Inhale 2 puffs into the lungs every 6 (six) hours as needed for wheezing or shortness of breath. 1 Inhaler 0  . alum & mag hydroxide-simeth (MAALOX/MYLANTA) 200-200-20 MG/5ML suspension Take 15 mLs by mouth every 6 (six) hours as needed for indigestion or heartburn.    Marland Kitchen amLODipine (NORVASC) 10 MG tablet Take 1 tablet (10 mg total) by mouth daily. (Patient taking differently: Take 10 mg by mouth daily. ) 180 tablet 3  . clopidogrel (PLAVIX) 75 MG tablet TAKE 1 TABLET (75 MG TOTAL) BY MOUTH DAILY. 90 tablet 3  . Coenzyme Q10 200 MG capsule Take 200 mg by mouth daily.     . fluticasone (FLONASE) 50 MCG/ACT nasal spray Place 2 sprays into both nostrils daily. 16 g 0  . furosemide (LASIX) 20 MG tablet Take 1 tablet (20 mg total) by mouth daily. 30 tablet 0  . Garlic Oil 1000 MG CAPS Take 1 capsule by mouth daily.      . isosorbide mononitrate (IMDUR) 60 MG 24 hr tablet TAKE 1 TABLET BY MOUTH EVERY DAY 90 tablet 3  . loratadine (CLARITIN) 10 MG tablet Take 10 mg by mouth daily as needed for rhinitis.     . metoprolol tartrate (LOPRESSOR) 25 MG tablet TAKE 1 TABLET (25 MG  TOTAL) BY MOUTH 2 (TWO) TIMES DAILY. 180 tablet 0  . Multiple Vitamins-Minerals (CENTRUM SILVER) tablet Take 1 tablet by mouth daily.      . nitroGLYCERIN (NITROSTAT) 0.4 MG SL tablet PLACE 1 TABLET (0.4 MG TOTAL) UNDER THE TONGUE EVERY 5 (FIVE) MINUTES AS NEEDED FOR CHEST PAIN. 25 tablet 3  . Omega-3 Fatty Acids (FISH OIL) 1000 MG CAPS Take 1 capsule by mouth daily.      . pantoprazole (PROTONIX) 40 MG tablet TAKE 1 TABLET (40 MG TOTAL) BY MOUTH DAILY. 30 tablet 11  . pravastatin (PRAVACHOL) 40 MG tablet TAKE 1 TABLET (40 MG TOTAL) BY MOUTH EVERY EVENING. 90 tablet 1  . ranolazine (RANEXA) 500 MG 12 hr tablet Take 1 tablet (  500 mg total) by mouth 2 (two) times daily. 180 tablet 3  . tamsulosin (FLOMAX) 0.4 MG CAPS capsule Take 0.4 mg by mouth daily.    Marland Kitchen losartan-hydrochlorothiazide (HYZAAR) 50-12.5 MG tablet Take 1 tablet by mouth daily. (Patient not taking: Reported on 05/07/2017) 90 tablet 3  . telmisartan (MICARDIS) 40 MG tablet TAKE 1 TABLET (40 MG TOTAL) BY MOUTH DAILY. (Patient not taking: Reported on 05/07/2017) 90 tablet 3   No current facility-administered medications on file prior to visit.     Allergies:   Allergies  Allergen Reactions  . Other     Environmental     Physical Exam General: Mildly obese elderly African-American male, seated, in no evident distress Head: head normocephalic and atraumatic.   Neck: supple with no carotid or supraclavicular bruits Cardiovascular: regular rate and rhythm, harsh ejection systolic murmur heard all over the precordium and on the right side of the sternum Musculoskeletal: no deformity Skin:  no rash/petichiae Vascular:  Normal pulses all extremities  Neurologic Exam Mental Status: Awake and fully alert. Oriented to place and time. Recent and remote memory intact. Attention span, concentration and fund of knowledge appropriate. Mood and affect appropriate. Diminished recall 0/3. Animal naming 9. Cranial Nerves: Fundoscopic exam not  done . Pupils equal, briskly reactive to light. Extraocular movements full without nystagmus. There is no eyelid drooping or Horner`s syndrome noted Visual fields full to confrontation. Hearing intact. Facial sensation intact. Face, tongue, palate moves normally and symmetrically.  Motor: Normal bulk and tone. Normal strength in all tested extremity muscles. Sensory.: intact to touch , pinprick , position and vibratory sensation.  Coordination: Rapid alternating movements normal in all extremities. Finger-to-nose and heel-to-shin performed accurately bilaterally. Gait and Station: Arises from chair without difficulty. Stance is normal. Gait demonstrates normal stride length and balance . Able to heel, toe and tandem walk without difficulty.  Reflexes: 1+ and symmetric. Toes downgoing.   NIHSS 0 Modified Rankin  1   ASSESSMENT: 67 year pleasant African-American male with transient episode of excessive daytime sleepiness, drooping of the right eyelid and gait and balance difficulties due to right thalamic/midbrain lacunar infarct likely from small vessel disease with multiple vascular risk factors of hypertension, hyperlipidemia, obesity, age and suspected sleep apnea     PLAN: I had a long d/w patient about his remote stroke,sleep apnoea, statin myalgias, risk for recurrent stroke/TIAs, personally independently reviewed imaging studies and stroke evaluation results and answered questions.Continue Plavixl 75 mg daily  for secondary stroke prevention and maintain strict control of hypertension with blood pressure goal below 130/90, diabetes with hemoglobin A1c goal below 6.5% and lipids with LDL cholesterol goal below 70 mg/dL. I also advised the patient to eat a healthy diet with plenty of whole grains, cereals, fruits and vegetables, exercise regularly and maintain ideal body weight .I advised him to discuss switching Pravachol to Crestor with primary physician and if he still has cramps and muscle  aches consider switching to the new PC SK 9 injections like Repatha or Praluent in the future. Continue CPAP for his sleep apnea and follow-up with Dr. Vickey Boyd. No routine scheduled follow-up with me is necessary. Greater than 50% time during this 25 minute visit was spent on counseling and coordination of care about his remote stroke, sleep apnea and answering questions    Delia Heady, MD  Note: This document was prepared with digital dictation and possible smart phrase technology. Any transcriptional errors that result from this process are unintentional.

## 2017-05-07 NOTE — Patient Instructions (Signed)
I had a long d/w patient about his remote stroke,sleep apnoea, statin myalgias, risk for recurrent stroke/TIAs, personally independently reviewed imaging studies and stroke evaluation results and answered questions.Continue Plavixl 75 mg daily  for secondary stroke prevention and maintain strict control of hypertension with blood pressure goal below 130/90, diabetes with hemoglobin A1c goal below 6.5% and lipids with LDL cholesterol goal below 70 mg/dL. I also advised the patient to eat a healthy diet with plenty of whole grains, cereals, fruits and vegetables, exercise regularly and maintain ideal body weight .I advised him to discuss switching Pravachol to Crestor with primary physician and if he still has cramps and muscle aches consider switching to the new PC SK 9 injections like Repatha or Praluent in the future. Continue CPAP for his sleep apnea and follow-up with Dr. Vickey Huger. No routine scheduled follow-up with me is necessary.

## 2017-05-08 ENCOUNTER — Encounter: Payer: Self-pay | Admitting: Internal Medicine

## 2017-05-08 ENCOUNTER — Ambulatory Visit (INDEPENDENT_AMBULATORY_CARE_PROVIDER_SITE_OTHER): Payer: Medicare Other | Admitting: Internal Medicine

## 2017-05-08 DIAGNOSIS — R600 Localized edema: Secondary | ICD-10-CM

## 2017-05-08 DIAGNOSIS — Z9989 Dependence on other enabling machines and devices: Secondary | ICD-10-CM

## 2017-05-08 DIAGNOSIS — G4733 Obstructive sleep apnea (adult) (pediatric): Secondary | ICD-10-CM | POA: Diagnosis not present

## 2017-05-08 DIAGNOSIS — I10 Essential (primary) hypertension: Secondary | ICD-10-CM | POA: Diagnosis not present

## 2017-05-08 MED ORDER — LOSARTAN POTASSIUM-HCTZ 50-12.5 MG PO TABS: 1.0000 | ORAL_TABLET | Freq: Every day | ORAL | 3 refills | Status: DC

## 2017-05-08 MED ORDER — ROSUVASTATIN CALCIUM 10 MG PO TABS: 10.0000 mg | ORAL_TABLET | Freq: Every day | ORAL | 3 refills | Status: DC

## 2017-05-08 MED ORDER — AMLODIPINE BESYLATE 5 MG PO TABS: 5.0000 mg | ORAL_TABLET | Freq: Every day | ORAL | 3 refills | Status: DC

## 2017-05-08 NOTE — Assessment & Plan Note (Signed)
Improved on lower dose of amlodipine and taking lasix Will try stopping lasix Restart losartan - hctz Elevate legs Low sodium diet

## 2017-05-08 NOTE — Progress Notes (Signed)
Subjective:    Patient ID: Ronnie Boyd, male    DOB: 1927/05/20, 83 y.o.   MRN: [id number]  HPI The patient is here for follow up of hypertension, leg edema  He saw Dr Caryl Never 6 weeks ago for bilateral leg edema.  He was not using his cpap.  He was taking his medications daily.  He has diastolic dysfunction, but on exam there was no evidence of heart failure.  His BP was controlled.  He was advised to restart cpap, elevate legs frequently, decrease amlodipine to 5 mg and start short term lasix 20 mg daily for 5 days. Blood work was checked and normal.  He has continued to take the lasix, but not daily.  He has taken the lasix three times in the past week.  He still has periodic leg swelling.  He has medications on his list that he is not taking and after reviewing visits form all his doctors over the past several months his medication list was revised.  His BP at home has been 150's/?.  It is still elevated at times.  He intermittent leg edema.  He denies SOB, chest pain, headaches and lightheadedness.     Medications and allergies reviewed with patient and updated if appropriate.  Patient Active Problem List   Diagnosis Date Noted  . Acute bronchitis 01/16/2017  . Urinary frequency 04/16/2016  . Coronary artery disease involving native coronary artery   . Diabetes (HCC) 11/09/2015  . OSA on CPAP 07/20/2015  . History of stroke, lacunar 2016 03/24/2015  . Aortic stenosis, moderate 2017 01/08/2011  . HYPERCHOLESTEROLEMIA 12/01/2007  . Essential hypertension 12/01/2007  . Peripheral vascular disease (HCC) 12/01/2007  . HEMORRHOIDS 12/01/2007  . Allergic rhinitis 12/01/2007  . GERD 12/01/2007  . DEGENERATIVE JOINT DISEASE 12/01/2007    Current Outpatient Prescriptions on File Prior to Visit  Medication Sig Dispense Refill  . albuterol (PROVENTIL HFA;VENTOLIN HFA) 108 (90 Base) MCG/ACT inhaler Inhale 2 puffs into the lungs every 6 (six) hours as needed for wheezing or  shortness of breath. 1 Inhaler 0  . alum & mag hydroxide-simeth (MAALOX/MYLANTA) 200-200-20 MG/5ML suspension Take 15 mLs by mouth every 6 (six) hours as needed for indigestion or heartburn.    . clopidogrel (PLAVIX) 75 MG tablet TAKE 1 TABLET (75 MG TOTAL) BY MOUTH DAILY. 90 tablet 3  . Coenzyme Q10 200 MG capsule Take 200 mg by mouth daily.     . fluticasone (FLONASE) 50 MCG/ACT nasal spray Place 2 sprays into both nostrils daily. 16 g 0  . furosemide (LASIX) 20 MG tablet Take 1 tablet (20 mg total) by mouth daily. 30 tablet 0  . Garlic Oil 1000 MG CAPS Take 1 capsule by mouth daily.      . isosorbide mononitrate (IMDUR) 60 MG 24 hr tablet TAKE 1 TABLET BY MOUTH EVERY DAY 90 tablet 3  . loratadine (CLARITIN) 10 MG tablet Take 10 mg by mouth daily as needed for rhinitis.     Marland Kitchen losartan-hydrochlorothiazide (HYZAAR) 50-12.5 MG tablet Take 1 tablet by mouth daily. 90 tablet 3  . metoprolol tartrate (LOPRESSOR) 25 MG tablet TAKE 1 TABLET (25 MG TOTAL) BY MOUTH 2 (TWO) TIMES DAILY. 180 tablet 0  . Multiple Vitamins-Minerals (CENTRUM SILVER) tablet Take 1 tablet by mouth daily.      . nitroGLYCERIN (NITROSTAT) 0.4 MG SL tablet PLACE 1 TABLET (0.4 MG TOTAL) UNDER THE TONGUE EVERY 5 (FIVE) MINUTES AS NEEDED FOR CHEST PAIN. 25 tablet 3  .  Omega-3 Fatty Acids (FISH OIL) 1000 MG CAPS Take 1 capsule by mouth daily.      . pantoprazole (PROTONIX) 40 MG tablet TAKE 1 TABLET (40 MG TOTAL) BY MOUTH DAILY. 30 tablet 11  . pravastatin (PRAVACHOL) 40 MG tablet TAKE 1 TABLET (40 MG TOTAL) BY MOUTH EVERY EVENING. 90 tablet 1  . ranolazine (RANEXA) 500 MG 12 hr tablet Take 1 tablet (500 mg total) by mouth 2 (two) times daily. 180 tablet 3  . tamsulosin (FLOMAX) 0.4 MG CAPS capsule Take 0.4 mg by mouth daily.    Marland Kitchen telmisartan (MICARDIS) 40 MG tablet TAKE 1 TABLET (40 MG TOTAL) BY MOUTH DAILY. 90 tablet 3  . amLODipine (NORVASC) 10 MG tablet Take 1 tablet (10 mg total) by mouth daily. (Patient taking differently: Take  10 mg by mouth daily. ) 180 tablet 3   No current facility-administered medications on file prior to visit.     Past Medical History:  Diagnosis Date  . Allergic rhinitis   . Aortic stenosis    mild echo 7/14  . Borderline type 2 diabetes mellitus   . Coronary artery disease   . Degenerative joint disease   . Family history of adverse reaction to anesthesia    "daughter had problems when she was a teen; none since; it was related to asthma"  . GERD (gastroesophageal reflux disease)   . Hemorrhoids   . History of hiatal hernia    "dx'd years ago" (06/01/2015)  . Hypercholesteremia   . Hypertension   . OSA on CPAP since 05/23/2015  . Prostatitis    hx of  . PVD (peripheral vascular disease) (HCC)   . Stroke [facility])     Past Surgical History:  Procedure Laterality Date  . CARDIAC CATHETERIZATION [name] 03/18/2015   Procedure: Left Heart Cath and Coronary Angiography;  Surgeon: Laurey Morale, MD;  Location: [facility] INVASIVE CV LAB;  Service: Cardiovascular;  Laterality: [name];  . CARDIAC CATHETERIZATION [name] 06/01/2015   Procedure: Coronary Stent Intervention;  Surgeon: Iran Ouch, MD;  Location: [facility] INVASIVE CV LAB;  Service: Cardiovascular;  Laterality: [name];  . CARDIAC CATHETERIZATION  11/11/2015  . CARDIAC CATHETERIZATION [name] 11/11/2015   Procedure: Intravascular Pressure Wire/FFR Study;  Surgeon: Tonny Bollman, MD;  Location: [facility] INVASIVE CV LAB;  Service: Cardiovascular;  Laterality: [name];  . CARPAL TUNNEL RELEASE Bilateral 2006-2007  . CATARACT EXTRACTION W/ INTRAOCULAR LENS  IMPLANT, BILATERAL Bilateral ~ 2003  . EXCISIONAL HEMORRHOIDECTOMY  1989    Social History   Social History  . Marital status: Married    Spouse name: [name]  . Number of children: [name]  . Years of education: [name]   Occupational History  . retired    Social History Main Topics  . Smoking status: Never Smoker  . Smokeless tobacco: Never Used  . Alcohol use No  . Drug use: No  . Sexual activity: Not  Currently   Other Topics Concern  . None   Social History Narrative   Daughter is [name] Psychologist, sport and exercise at [employer])    Family History  Problem Relation Age of Onset  . Hypertension Mother   . Diabetes Mother 37  . Stroke Father 38  . Arthritis Unknown   . Diabetes Brother     Review of Systems  Constitutional: Negative for chills and fever.  Respiratory: Negative for cough, shortness of breath and wheezing.   Cardiovascular: Positive for leg swelling (mild, intermittent). Negative for chest pain and palpitations.  Musculoskeletal: Positive  for back pain (left lower back).  Neurological: Negative for dizziness, light-headedness and headaches.       Objective:   Vitals:   05/08/17 0950  BP: 138/66  Pulse: 70  Resp: 16  Temp: 98.6 F (37 C)  SpO2: 98%   Wt Readings from Last 3 Encounters:  05/08/17 183 lb (83 kg)  05/07/17 182 lb 9.6 oz (82.8 kg)  03/26/17 189 lb (85.7 kg)   Body mass index is 31.41 kg/m.   Physical Exam    Constitutional: Appears well-developed and well-nourished. No distress.  HENT:  Head: Normocephalic and atraumatic.  Neck: Neck supple. No tracheal deviation present. No thyromegaly present.  No cervical lymphadenopathy Cardiovascular: Normal rate, regular rhythm and normal heart sounds.   2/6 systolic RSB murmur heard. No carotid bruit .  Mild edema RLE > LLE edema Pulmonary/Chest: Effort normal and breath sounds normal. No respiratory distress. No has no wheezes. No rales.  Skin: Skin is warm and dry. Not diaphoretic.  Psychiatric: Normal mood and affect. Behavior is normal.      Assessment & Plan:    See Problem List for Assessment and Plan of chronic medical problems.

## 2017-05-08 NOTE — Patient Instructions (Addendum)
  Medications reviewed and updated.  Changes include  -   Stop furosemide Restart losartan - hctz daily Continue the amlodipine 5 mg daily micardis was taking off your medication list  Stop pravastatin and start crestor 10 mg daily   Your prescription(s) have been submitted to your pharmacy. Please take as directed and contact our office if you believe you are having problem(s) with the medication(s).   Please followup in October as schedule

## 2017-05-08 NOTE — Assessment & Plan Note (Addendum)
He is back on cpap and using it nightly - stressed compliance

## 2017-05-08 NOTE — Assessment & Plan Note (Addendum)
Still elevated at home, ok here today Continue amlodipine 5 mg daily Restart losartan - hctz 50-12.5 Continue lopressor and imdur Stop the lasix Monitor BP at home  Monitor leg edema  Elevate if needed

## 2017-06-10 NOTE — Progress Notes (Signed)
Pre visit review using our clinic review tool, if applicable. No additional management support is needed unless otherwise documented below in the visit note. 

## 2017-06-10 NOTE — Progress Notes (Signed)
Subjective:   Ronnie Boyd is a 83 y.o. male who presents for Medicare Annual/Subsequent preventive examination.  Review of Systems:  No ROS.  Medicare Wellness Visit. Additional risk factors are reflected in the social history.  Cardiac Risk Factors include: advanced age (>60men, >4 women);hypertension;diabetes mellitus;male gender Sleep patterns: feels rested on waking, gets up 1 times nightly to void and sleeps 7-8 hours nightly.  Wears C-pap  Home Safety/Smoke Alarms: Feels safe in home. Smoke alarms in place.  Living environment; residence and Firearm Safety: 1-story house/ trailer, no firearms. Seat Belt Safety/Bike Helmet: Wears seat belt.    Objective:    Vitals: BP (!) 144/70   Pulse 75   Temp 98.1 F (36.7 C) (Oral)   Resp 16   Wt 183 lb (83 kg)   SpO2 98%   BMI 31.41 kg/m   Body mass index is 31.41 kg/m.  Tobacco History  Smoking Status  . Never Smoker  Smokeless Tobacco  . Never Used     Counseling given: Not Answered   Past Medical History:  Diagnosis Date  . Allergic rhinitis   . Aortic stenosis    mild echo 7/14  . Borderline type 2 diabetes mellitus   . Coronary artery disease   . Degenerative joint disease   . Family history of adverse reaction to anesthesia    "daughter had problems when she was a teen; none since; it was related to asthma"  . GERD (gastroesophageal reflux disease)   . Hemorrhoids   . History of hiatal hernia    "dx'd years ago" (06/01/2015)  . Hypercholesteremia   . Hypertension   . OSA on CPAP since 05/23/2015  . Prostatitis    hx of  . PVD (peripheral vascular disease) (HCC)   . Stroke [facility])    Past Surgical History:  Procedure Laterality Date  . CARDIAC CATHETERIZATION [name] 03/18/2015   Procedure: Left Heart Cath and Coronary Angiography;  Surgeon: Laurey Morale, MD;  Location: [facility] INVASIVE CV LAB;  Service: Cardiovascular;  Laterality: [name];  . CARDIAC CATHETERIZATION [name] 06/01/2015   Procedure: Coronary Stent  Intervention;  Surgeon: Iran Ouch, MD;  Location: [facility] INVASIVE CV LAB;  Service: Cardiovascular;  Laterality: [name];  . CARDIAC CATHETERIZATION  11/11/2015  . CARDIAC CATHETERIZATION [name] 11/11/2015   Procedure: Intravascular Pressure Wire/FFR Study;  Surgeon: Tonny Bollman, MD;  Location: [facility] INVASIVE CV LAB;  Service: Cardiovascular;  Laterality: [name];  . CARPAL TUNNEL RELEASE Bilateral 2006-2007  . CATARACT EXTRACTION W/ INTRAOCULAR LENS  IMPLANT, BILATERAL Bilateral ~ 2003  . EXCISIONAL HEMORRHOIDECTOMY  1989   Family History  Problem Relation Age of Onset  . Hypertension Mother   . Diabetes Mother 96  . Stroke Father 25  . Arthritis Unknown   . Diabetes Brother    History  Sexual Activity  . Sexual activity: Not Currently    Outpatient Encounter Prescriptions as of 06/11/2017  Medication Sig  . albuterol (PROVENTIL HFA;VENTOLIN HFA) 108 (90 Base) MCG/ACT inhaler Inhale 2 puffs into the lungs every 6 (six) hours as needed for wheezing or shortness of breath.  Marland Kitchen alum & mag hydroxide-simeth (MAALOX/MYLANTA) 200-200-20 MG/5ML suspension Take 15 mLs by mouth every 6 (six) hours as needed for indigestion or heartburn.  Marland Kitchen amLODipine (NORVASC) 5 MG tablet Take 1 tablet (5 mg total) by mouth daily.  Marland Kitchen aspirin 81 MG chewable tablet Chew by mouth daily.  . clopidogrel (PLAVIX) 75 MG tablet TAKE 1 TABLET (75 MG TOTAL) BY MOUTH DAILY.  Marland Kitchen  Coenzyme Q10 200 MG capsule Take 200 mg by mouth daily.   . CRESTOR 10 MG tablet Take 1 tablet (10 mg total) by mouth daily.  . fluticasone (FLONASE) 50 MCG/ACT nasal spray Place 2 sprays into both nostrils daily.  . Garlic Oil 1000 MG CAPS Take 1 capsule by mouth daily.    . isosorbide mononitrate (IMDUR) 60 MG 24 hr tablet TAKE 1 TABLET BY MOUTH EVERY DAY  . loratadine (CLARITIN) 10 MG tablet Take 10 mg by mouth daily as needed for rhinitis.   Marland Kitchen losartan-hydrochlorothiazide (HYZAAR) 50-12.5 MG tablet Take 1 tablet by mouth daily.  . metoprolol tartrate  (LOPRESSOR) 25 MG tablet TAKE 1 TABLET (25 MG TOTAL) BY MOUTH 2 (TWO) TIMES DAILY.  . Multiple Vitamins-Minerals (CENTRUM SILVER) tablet Take 1 tablet by mouth daily.    . nitroGLYCERIN (NITROSTAT) 0.4 MG SL tablet PLACE 1 TABLET (0.4 MG TOTAL) UNDER THE TONGUE EVERY 5 (FIVE) MINUTES AS NEEDED FOR CHEST PAIN.  Marland Kitchen Omega-3 Fatty Acids (FISH OIL) 1000 MG CAPS Take 1 capsule by mouth daily.    . pantoprazole (PROTONIX) 40 MG tablet TAKE 1 TABLET (40 MG TOTAL) BY MOUTH DAILY.  . ranolazine (RANEXA) 500 MG 12 hr tablet Take 1 tablet (500 mg total) by mouth 2 (two) times daily.  . tamsulosin (FLOMAX) 0.4 MG CAPS capsule Take 0.4 mg by mouth daily.  . [DISCONTINUED] rosuvastatin (CRESTOR) 10 MG tablet Take 1 tablet (10 mg total) by mouth daily.   No facility-administered encounter medications on file as of 06/11/2017.     Activities of Daily Living In your present state of health, do you have any difficulty performing the following activities: 06/11/2017  Hearing? N  Vision? N  Difficulty concentrating or making decisions? N  Walking or climbing stairs? N  Dressing or bathing? N  Doing errands, shopping? N  Preparing Food and eating ? N  Using the Toilet? N  In the past six months, have you accidently leaked urine? N  Do you have problems with loss of bowel control? Y  Managing your Medications? N  Managing your Finances? N  Housekeeping or managing your Housekeeping? N  Some recent data might be hidden    Patient Care Team: Pincus Sanes, MD as PCP - General (Internal Medicine) Laurey Morale, MD (Cardiology) Marcine Matar, MD (Urology) Dohmeier, Porfirio Mylar, MD (Neurology) Micki Riley, MD (Neurology)   Assessment:    Physical assessment deferred to PCP.  Exercise Activities and Dietary recommendations Current Exercise Habits: Home exercise routine (chair exercise pamphlet provided), Type of exercise: stretching;walking, Time (Minutes): 20, Frequency (Times/Week): 4, Weekly  Exercise (Minutes/Week): 80, Intensity: Mild, Exercise limited by: Other - see comments (myalgia)  Diet (meal preparation, eat out, water intake, caffeinated beverages, dairy products, fruits and vegetables): in general, a "healthy" diet  , well balanced, eats a variety of fruits and vegetables daily, limits salt, fat/cholesterol, sugar, caffeine, drinks 4-5 glasses of water daily.  Reviewed heart healthy and diabetic diet, encouraged patient to increase daily water intake.    Goals    . exercise          Continue to do yard work and gardening; work in intervals      . Stayt as active and as independent as possible          Start to garden in pots, do chair exercises, eat more fruits and vegetables, increase the amount of water I drink, enjoy life and family.     . Weight (  lb) < 170 lb (77.1 kg)          Will lose a few pounds; Try to walk  - 30 minutes 5 days a week             Fall Risk Fall Risk  06/11/2017 05/08/2017 05/07/2017 06/06/2016 06/06/2016  Falls in the past year? No No No No No  Risk for fall due to : - - - - -   Depression Screen PHQ 2/9 Scores 06/11/2017 05/08/2017 06/06/2016 11/02/2015  PHQ - 2 Score 0 0 0 0  PHQ- 9 Score 0 - - -    Cognitive Function MMSE - Mini Mental State Exam 06/11/2017 06/06/2016 06/06/2016  Not completed: - (No Data) (No Data)  Orientation to time 5 - -  Orientation to Place 5 - -  Registration 3 - -  Attention/ Calculation 5 - -  Recall 2 - -  Language- name 2 objects 2 - -  Language- repeat 1 - -  Language- follow 3 step command 3 - -  Language- read & follow direction 1 - -  Write a sentence 1 - -  Copy design 1 - -  Total score 29 - -        Immunization History  Administered Date(s) Administered  . Influenza Split 07/02/2011, 07/07/2012  . Influenza Whole 05/31/2008, 07/12/2009, 06/27/2010  . Influenza, High Dose Seasonal PF 06/26/2013, 06/22/2014, 06/29/2015, 06/06/2016, 06/11/2017  . Pneumococcal Conjugate-13  12/23/2014  . Pneumococcal Polysaccharide-23 12/22/2013  . Tdap 11/08/2004, 12/12/2014   Screening Tests Health Maintenance  Topic Date Due  . OPHTHALMOLOGY EXAM  08/04/1937  . HEMOGLOBIN A1C  06/08/2017  . FOOT EXAM  06/11/2018  . TETANUS/TDAP  12/11/2024  . INFLUENZA VACCINE  Completed  . PNA vac Low Risk Adult  Completed      Plan:    Call your cardiologist to ask about taking plavix and aspirin together.   Continue doing brain stimulating activities (puzzles, reading, adult coloring books, staying active) to keep memory sharp.   Continue to eat heart healthy diet (full of fruits, vegetables, whole grains, lean protein, water--limit salt, fat, and sugar intake) and increase physical activity as tolerated.  I have personally reviewed and noted the following in the patient's chart:   . Medical and social history . Use of alcohol, tobacco or illicit drugs  . Current medications and supplements . Functional ability and status . Nutritional status . Physical activity . Advanced directives . List of other physicians . Vitals . Screenings to include cognitive, depression, and falls . Referrals and appointments  In addition, I have reviewed and discussed with patient certain preventive protocols, quality metrics, and best practice recommendations. A written personalized care plan for preventive services as well as general preventive health recommendations were provided to patient.     Wanda Plump, RN  06/11/2017  Medical screening examination/treatment/procedure(s) were performed by non-physician practitioner and as supervising physician I was immediately available for consultation/collaboration. I agree with above. Pincus Sanes, MD

## 2017-06-10 NOTE — Progress Notes (Signed)
Subjective:    Patient ID: Ronnie Boyd, male    DOB: [birth year], 83 y.o.   MRN: [id number]  HPI The patient is here for follow up.  Hyperlipidemia: He is taking his generic crestor daily. He has leg cramping when he takes the medication. He has not tolerated pravastatin and atorvastatin due to leg cramping.  He has never tried the non-generic form of crestor.  He is compliant with a low fat/cholesterol diet. He is exercising minimally- short walks due to muscle pains in legs - maybe 5 minutes, some leg/arm exercises.    GERD:  He is taking his medication daily as prescribed.  He denies any GERD symptoms and feels his GERD is well controlled.   Leg edema, Hypertension: we stopped his lasix and restarted his losartan-hctz.  He is taking his medication daily. He is compliant with a low sodium diet.  He still has right leg edema.  He denies chest pain, palpitations, regular shortness of breath and regular headaches. He is exercising regularly.  He does monitor his blood pressure at home - controlled at home.    Diabetes: He is taking his medication daily as prescribed. He is compliant with a diabetic diet. He is exercising minimally.  He checks his feet daily and denies foot lesions. He is up-to-date with an ophthalmology examination.   OSA:  He is using his cpap nightly.     Medications and allergies reviewed with patient and updated if appropriate.  Patient Active Problem List   Diagnosis Date Noted  . Bilateral leg edema 05/08/2017  . Urinary frequency 04/16/2016  . Coronary artery disease involving native coronary artery   . Diabetes (HCC) 11/09/2015  . OSA on CPAP 07/20/2015  . History of stroke, lacunar 2016 03/24/2015  . Aortic stenosis, moderate 2017 01/08/2011  . HYPERCHOLESTEROLEMIA 12/01/2007  . Essential hypertension 12/01/2007  . Peripheral vascular disease (HCC) 12/01/2007  . HEMORRHOIDS 12/01/2007  . Allergic rhinitis 12/01/2007  . GERD 12/01/2007  . DEGENERATIVE  JOINT DISEASE 12/01/2007    Current Outpatient Prescriptions on File Prior to Visit  Medication Sig Dispense Refill  . albuterol (PROVENTIL HFA;VENTOLIN HFA) 108 (90 Base) MCG/ACT inhaler Inhale 2 puffs into the lungs every 6 (six) hours as needed for wheezing or shortness of breath. 1 Inhaler 0  . alum & mag hydroxide-simeth (MAALOX/MYLANTA) 200-200-20 MG/5ML suspension Take 15 mLs by mouth every 6 (six) hours as needed for indigestion or heartburn.    Marland Kitchen amLODipine (NORVASC) 5 MG tablet Take 1 tablet (5 mg total) by mouth daily. 90 tablet 3  . clopidogrel (PLAVIX) 75 MG tablet TAKE 1 TABLET (75 MG TOTAL) BY MOUTH DAILY. 90 tablet 3  . Coenzyme Q10 200 MG capsule Take 200 mg by mouth daily.     . fluticasone (FLONASE) 50 MCG/ACT nasal spray Place 2 sprays into both nostrils daily. 16 g 0  . Garlic Oil 1000 MG CAPS Take 1 capsule by mouth daily.      . isosorbide mononitrate (IMDUR) 60 MG 24 hr tablet TAKE 1 TABLET BY MOUTH EVERY DAY 90 tablet 3  . loratadine (CLARITIN) 10 MG tablet Take 10 mg by mouth daily as needed for rhinitis.     Marland Kitchen losartan-hydrochlorothiazide (HYZAAR) 50-12.5 MG tablet Take 1 tablet by mouth daily. 90 tablet 3  . metoprolol tartrate (LOPRESSOR) 25 MG tablet TAKE 1 TABLET (25 MG TOTAL) BY MOUTH 2 (TWO) TIMES DAILY. 180 tablet 0  . Multiple Vitamins-Minerals (CENTRUM SILVER) tablet Take 1 tablet  by mouth daily.      . nitroGLYCERIN (NITROSTAT) 0.4 MG SL tablet PLACE 1 TABLET (0.4 MG TOTAL) UNDER THE TONGUE EVERY 5 (FIVE) MINUTES AS NEEDED FOR CHEST PAIN. 25 tablet 3  . Omega-3 Fatty Acids (FISH OIL) 1000 MG CAPS Take 1 capsule by mouth daily.      . pantoprazole (PROTONIX) 40 MG tablet TAKE 1 TABLET (40 MG TOTAL) BY MOUTH DAILY. 30 tablet 11  . ranolazine (RANEXA) 500 MG 12 hr tablet Take 1 tablet (500 mg total) by mouth 2 (two) times daily. 180 tablet 3  . rosuvastatin (CRESTOR) 10 MG tablet Take 1 tablet (10 mg total) by mouth daily. 90 tablet 3  . tamsulosin (FLOMAX) 0.4  MG CAPS capsule Take 0.4 mg by mouth daily.     No current facility-administered medications on file prior to visit.     Past Medical History:  Diagnosis Date  . Allergic rhinitis   . Aortic stenosis    mild echo 7/14  . Borderline type 2 diabetes mellitus   . Coronary artery disease   . Degenerative joint disease   . Family history of adverse reaction to anesthesia    "daughter had problems when she was a teen; none since; it was related to asthma"  . GERD (gastroesophageal reflux disease)   . Hemorrhoids   . History of hiatal hernia    "dx'd years ago" (06/01/2015)  . Hypercholesteremia   . Hypertension   . OSA on CPAP since 05/23/2015  . Prostatitis    hx of  . PVD (peripheral vascular disease) (HCC)   . Stroke [facility])     Past Surgical History:  Procedure Laterality Date  . CARDIAC CATHETERIZATION [name] 03/18/2015   Procedure: Left Heart Cath and Coronary Angiography;  Surgeon: Laurey Morale, MD;  Location: [facility] INVASIVE CV LAB;  Service: Cardiovascular;  Laterality: [name];  . CARDIAC CATHETERIZATION [name] 06/01/2015   Procedure: Coronary Stent Intervention;  Surgeon: Iran Ouch, MD;  Location: [facility] INVASIVE CV LAB;  Service: Cardiovascular;  Laterality: [name];  . CARDIAC CATHETERIZATION  11/11/2015  . CARDIAC CATHETERIZATION [name] 11/11/2015   Procedure: Intravascular Pressure Wire/FFR Study;  Surgeon: Tonny Bollman, MD;  Location: [facility] INVASIVE CV LAB;  Service: Cardiovascular;  Laterality: [name];  . CARPAL TUNNEL RELEASE Bilateral 2006-2007  . CATARACT EXTRACTION W/ INTRAOCULAR LENS  IMPLANT, BILATERAL Bilateral ~ 2003  . EXCISIONAL HEMORRHOIDECTOMY  1989    Social History   Social History  . Marital status: Married    Spouse name: [name]  . Number of children: [name]  . Years of education: [name]   Occupational History  . retired    Social History Main Topics  . Smoking status: Never Smoker  . Smokeless tobacco: Never Used  . Alcohol use No  . Drug use: No  . Sexual activity: Not  Currently   Other Topics Concern  . None   Social History Narrative   Daughter is [name] Psychologist, sport and exercise at [employer])    Family History  Problem Relation Age of Onset  . Hypertension Mother   . Diabetes Mother 64  . Stroke Father 50  . Arthritis Unknown   . Diabetes Brother     Review of Systems  Constitutional: Negative for chills and fever.  Respiratory: Positive for shortness of breath (occ with walking). Negative for cough and wheezing.   Cardiovascular: Positive for leg swelling (right leg only). Negative for chest pain and palpitations.  Musculoskeletal: Positive for myalgias.  Neurological: Positive  for light-headedness (if he gets up too soon). Negative for headaches.       Objective:   Vitals:   06/11/17 0956  BP: (!) 144/70  Pulse: 75  Resp: 16  Temp: 98.1 F (36.7 C)  SpO2: 98%   Wt Readings from Last 3 Encounters:  06/11/17 183 lb (83 kg)  05/08/17 183 lb (83 kg)  05/07/17 182 lb 9.6 oz (82.8 kg)   Body mass index is 31.41 kg/m.   Physical Exam    Constitutional: Appears well-developed and well-nourished. No distress.  HENT:  Head: Normocephalic and atraumatic.  Neck: Neck supple. No tracheal deviation present. No thyromegaly present.  No cervical lymphadenopathy Cardiovascular: Normal rate, regular rhythm and normal heart sounds.   No murmur heard. No carotid bruit .  No edema Pulmonary/Chest: Effort normal and breath sounds normal. No respiratory distress. No has no wheezes. No rales.  Skin: Skin is warm and dry. Not diaphoretic.  Psychiatric: Normal mood and affect. Behavior is normal.    Diabetic Foot Exam - Simple   Simple Foot Form Diabetic Foot exam was performed with the following findings:  Yes 06/11/2017 10:21 AM  Visual Inspection No deformities, no ulcerations, no other skin breakdown bilaterally:  Yes Sensation Testing Intact to touch and monofilament testing bilaterally:  Yes Pulse Check Posterior Tibialis  and Dorsalis pulse intact bilaterally:  Yes Comments      Assessment & Plan:    See Problem List for Assessment and Plan of chronic medical problems.

## 2017-06-11 ENCOUNTER — Ambulatory Visit (INDEPENDENT_AMBULATORY_CARE_PROVIDER_SITE_OTHER): Payer: Medicare Other | Admitting: Internal Medicine

## 2017-06-11 ENCOUNTER — Other Ambulatory Visit (INDEPENDENT_AMBULATORY_CARE_PROVIDER_SITE_OTHER): Payer: Medicare Other

## 2017-06-11 ENCOUNTER — Ambulatory Visit (HOSPITAL_COMMUNITY)
Admission: RE | Admit: 2017-06-11 | Discharge: 2017-06-11 | Disposition: A | Discharge status: Home / Self Care | Payer: Medicare Other | Source: Ambulatory Visit | Attending: Cardiovascular Disease | Admitting: Cardiovascular Disease

## 2017-06-11 ENCOUNTER — Encounter: Payer: Self-pay | Admitting: Internal Medicine

## 2017-06-11 VITALS — BP 144/70 | HR 75 | Temp 98.1°F | Resp 16 | Wt 183.0 lb

## 2017-06-11 DIAGNOSIS — E0822 Diabetes mellitus due to underlying condition with diabetic chronic kidney disease: Secondary | ICD-10-CM | POA: Diagnosis not present

## 2017-06-11 DIAGNOSIS — I2510 Atherosclerotic heart disease of native coronary artery without angina pectoris: Secondary | ICD-10-CM

## 2017-06-11 DIAGNOSIS — Z0000 Encounter for general adult medical examination without abnormal findings: Secondary | ICD-10-CM

## 2017-06-11 DIAGNOSIS — Z23 Encounter for immunization: Secondary | ICD-10-CM

## 2017-06-11 DIAGNOSIS — R600 Localized edema: Secondary | ICD-10-CM | POA: Diagnosis not present

## 2017-06-11 DIAGNOSIS — K219 Gastro-esophageal reflux disease without esophagitis: Secondary | ICD-10-CM

## 2017-06-11 DIAGNOSIS — R601 Generalized edema: Secondary | ICD-10-CM

## 2017-06-11 DIAGNOSIS — M7989 Other specified soft tissue disorders: Secondary | ICD-10-CM | POA: Insufficient documentation

## 2017-06-11 DIAGNOSIS — E7800 Pure hypercholesterolemia, unspecified: Secondary | ICD-10-CM

## 2017-06-11 DIAGNOSIS — Z9989 Dependence on other enabling machines and devices: Secondary | ICD-10-CM

## 2017-06-11 DIAGNOSIS — N183 Chronic kidney disease, stage 3 (moderate): Secondary | ICD-10-CM | POA: Diagnosis not present

## 2017-06-11 DIAGNOSIS — M791 Myalgia, unspecified site: Secondary | ICD-10-CM | POA: Insufficient documentation

## 2017-06-11 DIAGNOSIS — I10 Essential (primary) hypertension: Secondary | ICD-10-CM

## 2017-06-11 DIAGNOSIS — G4733 Obstructive sleep apnea (adult) (pediatric): Secondary | ICD-10-CM | POA: Diagnosis not present

## 2017-06-11 LAB — COMPREHENSIVE METABOLIC PANEL
ALK PHOS: 32 U/L — AB (ref 39–117)
ALT: 14 U/L (ref 0–53)
AST: 16 U/L (ref 0–37)
Albumin: 4.2 g/dL (ref 3.5–5.2)
BUN: 17 mg/dL (ref 6–23)
CHLORIDE: 102 meq/L (ref 96–112)
CO2: 28 mEq/L (ref 19–32)
Calcium: 9.5 mg/dL (ref 8.4–10.5)
Creatinine, Ser: 1.29 mg/dL (ref 0.40–1.50)
GFR: 67.62 mL/min (ref >60.00)
GLUCOSE: 91 mg/dL (ref 70–99)
POTASSIUM: 3.9 meq/L (ref 3.5–5.1)
Sodium: 138 mEq/L (ref 135–145)
Total Bilirubin: 0.9 mg/dL (ref 0.2–1.2)
Total Protein: 7.1 g/dL (ref 6.0–8.3)

## 2017-06-11 LAB — LIPID PANEL
CHOL/HDL RATIO: 2
Cholesterol: 187 mg/dL (ref 0–200)
HDL: 80.4 mg/dL (ref >39.00)
LDL Cholesterol: 97 mg/dL (ref 0–99)
NonHDL: 106.56
TRIGLYCERIDES: 48 mg/dL (ref 0.0–149.0)
VLDL: 9.6 mg/dL (ref 0.0–40.0)

## 2017-06-11 LAB — SEDIMENTATION RATE: SED RATE: 27 mm/h — AB (ref 0–20)

## 2017-06-11 LAB — HEMOGLOBIN A1C: Hgb A1c MFr Bld: 6.5 % (ref 4.6–6.5)

## 2017-06-11 LAB — CK: Total CK: 147 U/L (ref 7–232)

## 2017-06-11 MED ORDER — CRESTOR 10 MG PO TABS: 10.0000 mg | ORAL_TABLET | Freq: Every day | ORAL | 3 refills | Status: DC

## 2017-06-11 NOTE — Assessment & Plan Note (Signed)
BP Readings from Last 3 Encounters:  06/11/17 (!) 144/70  05/08/17 138/66  05/07/17 (!) 177/81   BP controlled Current regimen effective and well tolerated Continue current medications at current doses cmp

## 2017-06-11 NOTE — Assessment & Plan Note (Signed)
Right leg > left leg Taking hctz daily

## 2017-06-11 NOTE — Assessment & Plan Note (Signed)
Legs ache and cramp ? Related to statins Check CK, esr, cbc

## 2017-06-11 NOTE — Assessment & Plan Note (Signed)
Using cpap nightly 

## 2017-06-11 NOTE — Assessment & Plan Note (Signed)
Check a1c Low sugar / carb diet Stressed regular exercise   

## 2017-06-11 NOTE — Assessment & Plan Note (Signed)
No chest pain or palpitations Continue current medications

## 2017-06-11 NOTE — Patient Instructions (Addendum)
Have blood work done today.   An Ultrasound of your right leg was ordered - someone will call you to schedule this.   Call your cardiologist to ask about taking plavix and aspirin together.   Continue doing brain stimulating activities (puzzles, reading, adult coloring books, staying active) to keep memory sharp.   Continue to eat heart healthy diet (full of fruits, vegetables, whole grains, lean protein, water--limit salt, fat, and sugar intake) and increase physical activity as tolerated.   Ronnie Boyd , Thank you for taking time to come for your Medicare Wellness Visit. I appreciate your ongoing commitment to your health goals. Please review the following plan we discussed and let me know if I can assist you in the future.   These are the goals we discussed: Goals    . exercise          Continue to do yard work and gardening; work in intervals      . Stayt as active and as independent as possible          Start to garden in pots, do chair exercises, eat more fruits and vegetables, increase the amount of water I drink, enjoy life and family.     . Weight (lb) < 170 lb (77.1 kg)          Will lose a few pounds; Try to walk  - 30 minutes 5 days a week              This is a list of the screening recommended for you and due dates:  Health Maintenance  Topic Date Due  . Eye exam for diabetics  08/04/1937  . Hemoglobin A1C  06/08/2017  . Complete foot exam   06/11/2018  . Tetanus Vaccine  12/11/2024  . Flu Shot  Completed  . Pneumonia vaccines  Completed

## 2017-06-11 NOTE — Assessment & Plan Note (Signed)
GERD controlled Continue daily medication  

## 2017-06-11 NOTE — Assessment & Plan Note (Signed)
R > L Will check [place] to r/o DVT Continue hctz

## 2017-06-12 ENCOUNTER — Encounter: Payer: Self-pay | Admitting: Internal Medicine

## 2017-06-12 DIAGNOSIS — Z86718 Personal history of other venous thrombosis and embolism: Secondary | ICD-10-CM | POA: Insufficient documentation

## 2017-06-13 ENCOUNTER — Other Ambulatory Visit: Payer: Self-pay | Admitting: Cardiology

## 2017-06-13 ENCOUNTER — Encounter: Payer: Self-pay | Admitting: Emergency Medicine

## 2017-06-13 DIAGNOSIS — I2510 Atherosclerotic heart disease of native coronary artery without angina pectoris: Secondary | ICD-10-CM

## 2017-06-13 DIAGNOSIS — I10 Essential (primary) hypertension: Secondary | ICD-10-CM

## 2017-06-13 NOTE — Telephone Encounter (Signed)
REFILL 

## 2017-06-17 NOTE — Telephone Encounter (Signed)
Medication Instructions:  1) DISCONTINUE Aspirin 2) DISCONTINUE Cozaar 3) DISCONTINUE HCTZ (Hydrochlorothiazide)  4) START Hyzaar 50/12.5mg  once daily (Losartan/HCTZ)  On 10/01/16 with Dr. Katrinka Blazing

## 2017-08-11 ENCOUNTER — Other Ambulatory Visit: Payer: Self-pay | Admitting: Cardiology

## 2017-08-11 DIAGNOSIS — I739 Peripheral vascular disease, unspecified: Secondary | ICD-10-CM

## 2017-08-11 DIAGNOSIS — I350 Nonrheumatic aortic (valve) stenosis: Secondary | ICD-10-CM

## 2017-09-17 LAB — HM DIABETES EYE EXAM

## 2017-09-18 ENCOUNTER — Other Ambulatory Visit: Payer: Self-pay | Admitting: Emergency Medicine

## 2017-09-18 NOTE — Telephone Encounter (Signed)
Are you okay with filling this, looks like cardiology filled last

## 2017-09-18 NOTE — Telephone Encounter (Signed)
Send rx request to cardiology

## 2017-09-19 ENCOUNTER — Other Ambulatory Visit (HOSPITAL_COMMUNITY): Payer: Self-pay | Admitting: Cardiology

## 2017-09-20 ENCOUNTER — Other Ambulatory Visit: Payer: Self-pay | Admitting: Interventional Cardiology

## 2017-09-20 MED ORDER — RANOLAZINE ER 500 MG PO TB12: 500.0000 mg | ORAL_TABLET | Freq: Two times a day (BID) | ORAL | 0 refills | Status: DC

## 2017-09-26 NOTE — Telephone Encounter (Signed)
I can't access the requested Rx. Reviewed chart - I don't know this patient. Dr Katrinka BlazingSmith has taken over for Dr Shirlee LatchMcLean with his move to HF.

## 2017-10-03 ENCOUNTER — Ambulatory Visit (HOSPITAL_COMMUNITY)
Admission: RE | Admit: 2017-10-03 | Discharge: 2017-10-03 | Disposition: A | Discharge status: Home / Self Care | Payer: Medicare Other | Source: Ambulatory Visit | Attending: Cardiovascular Disease | Admitting: Cardiovascular Disease

## 2017-10-03 DIAGNOSIS — I739 Peripheral vascular disease, unspecified: Secondary | ICD-10-CM | POA: Insufficient documentation

## 2017-10-15 NOTE — Progress Notes (Signed)
Cardiology Office Note    Date:  10/16/2017   ID:  Ronnie Boyd, DOB Mar 26, 1927, MRN [id number]  PCP:  Pincus Sanes, MD  Cardiologist: Lesleigh Noe, MD   Chief Complaint  Patient presents with  . Coronary Artery Disease    History of Present Illness:  Ronnie Boyd is a 84 y.o. male  with history of CAD, with stents in the mid LAD and distal LAD, as well as a ramus intermedius stent. He has diffuse distal vessel disease in other territories. Other problems include moderate aortic stenosis, hypertension, diabetes, peripheral arterial disease, and obstructive sleep apnea.   If he walks too briskly he is limited by dyspnea on exertion, chest tightness, and lower extremity weakness and aching.  No dramatic change but slowly progressive over the last several years.  Denies orthopnea and PND.  Dyspnea and chest discomfort have limited his ability to do yard work [employer], [employer] care.  He does not walk for exercise because of claudication.  He does walk up and down his driveway frequently.   Past Medical History:  Diagnosis Date  . Allergic rhinitis   . Aortic stenosis    mild echo 7/14  . Borderline type 2 diabetes mellitus   . Coronary artery disease   . Degenerative joint disease   . Family history of adverse reaction to anesthesia    "daughter had problems when she was a teen; none since; it was related to asthma"  . GERD (gastroesophageal reflux disease)   . Hemorrhoids   . History of hiatal hernia    "dx'd years ago" (06/01/2015)  . Hypercholesteremia   . Hypertension   . OSA on CPAP since 05/23/2015  . Prostatitis    hx of  . PVD (peripheral vascular disease) (HCC)   . Stroke [facility])     Past Surgical History:  Procedure Laterality Date  . CARDIAC CATHETERIZATION [name] 03/18/2015   Procedure: Left Heart Cath and Coronary Angiography;  Surgeon: Laurey Morale, MD;  Location: [facility] INVASIVE CV LAB;  Service: Cardiovascular;  Laterality: [name];  . CARDIAC CATHETERIZATION [name]  06/01/2015   Procedure: Coronary Stent Intervention;  Surgeon: Iran Ouch, MD;  Location: [facility] INVASIVE CV LAB;  Service: Cardiovascular;  Laterality: [name];  . CARDIAC CATHETERIZATION  11/11/2015  . CARDIAC CATHETERIZATION [name] 11/11/2015   Procedure: Intravascular Pressure Wire/FFR Study;  Surgeon: Tonny Bollman, MD;  Location: [facility] INVASIVE CV LAB;  Service: Cardiovascular;  Laterality: [name];  . CARPAL TUNNEL RELEASE Bilateral 2006-2007  . CATARACT EXTRACTION W/ INTRAOCULAR LENS  IMPLANT, BILATERAL Bilateral ~ 2003  . EXCISIONAL HEMORRHOIDECTOMY  1989    Current Medications: Outpatient Medications Prior to Visit  Medication Sig Dispense Refill  . albuterol (PROVENTIL HFA;VENTOLIN HFA) 108 (90 Base) MCG/ACT inhaler Inhale 2 puffs into the lungs every 6 (six) hours as needed for wheezing or shortness of breath. 1 Inhaler 0  . alum & mag hydroxide-simeth (MAALOX/MYLANTA) 200-200-20 MG/5ML suspension Take 15 mLs by mouth every 6 (six) hours as needed for indigestion or heartburn.    Marland Kitchen amLODipine (NORVASC) 5 MG tablet Take 1 tablet (5 mg total) by mouth daily. 90 tablet 3  . aspirin 81 MG chewable tablet Chew by mouth daily.    . clopidogrel (PLAVIX) 75 MG tablet TAKE 1 TABLET (75 MG TOTAL) BY MOUTH DAILY. 90 tablet 3  . Coenzyme Q10 200 MG capsule Take 200 mg by mouth daily.     . CRESTOR 10 MG tablet Take 1 tablet (  10 mg total) by mouth daily. 90 tablet 3  . fluticasone (FLONASE) 50 MCG/ACT nasal spray Place 2 sprays into both nostrils daily. 16 g 0  . Garlic Oil 1000 MG CAPS Take 1 capsule by mouth daily.      Marland Kitchen. loratadine (CLARITIN) 10 MG tablet Take 10 mg by mouth daily as needed for rhinitis.     Marland Kitchen. losartan-hydrochlorothiazide (HYZAAR) 50-12.5 MG tablet Take 1 tablet by mouth daily. 90 tablet 3  . metoprolol tartrate (LOPRESSOR) 25 MG tablet Take 1 tablet (25 mg total) by mouth 2 (two) times daily. NEED OV. 180 tablet 0  . Multiple Vitamins-Minerals (CENTRUM SILVER) tablet Take 1 tablet by  mouth daily.      . nitroGLYCERIN (NITROSTAT) 0.4 MG SL tablet PLACE 1 TABLET (0.4 MG TOTAL) UNDER THE TONGUE EVERY 5 (FIVE) MINUTES AS NEEDED FOR CHEST PAIN. 25 tablet 3  . Omega-3 Fatty Acids (FISH OIL) 1000 MG CAPS Take 1 capsule by mouth daily.      . pantoprazole (PROTONIX) 40 MG tablet TAKE 1 TABLET (40 MG TOTAL) BY MOUTH DAILY. 30 tablet 11  . ranolazine (RANEXA) 500 MG 12 hr tablet Take 1 tablet (500 mg total) by mouth 2 (two) times daily. Please keep upcoming appt with Dr. Katrinka BlazingSmith in February. Thank you 180 tablet 0  . tamsulosin (FLOMAX) 0.4 MG CAPS capsule Take 0.4 mg by mouth daily.    . isosorbide mononitrate (IMDUR) 60 MG 24 hr tablet Take 1 tablet (60 mg total) by mouth daily. Please keep upcoming appt with Dr. Katrinka BlazingSmith in February for future refills. Thank you 90 tablet 0  . losartan (COZAAR) 25 MG tablet Take 1 tablet (25 mg total) by mouth daily. Please make appt with Dr. Katrinka BlazingSmith for January. 1st attempt (Patient not taking: Reported on 10/16/2017) 30 tablet 1   No facility-administered medications prior to visit.      Allergies:   Lipitor [atorvastatin]; Other; and Pravastatin   Social History   Socioeconomic History  . Marital status: Married    Spouse name: None  . Number of children: None  . Years of education: None  . Highest education level: None  Social Needs  . Financial resource strain: None  . Food insecurity - worry: None  . Food insecurity - inability: None  . Transportation needs - medical: None  . Transportation needs - non-medical: None  Occupational History  . Occupation: retired  Tobacco Use  . Smoking status: Never Smoker  . Smokeless tobacco: Never Used  Substance and Sexual Activity  . Alcohol use: No  . Drug use: No  . Sexual activity: Not Currently  Other Topics Concern  . None  Social History Narrative   Daughter is Mrs. Greer PickerelMcMillan Psychologist, sport and exercise(ACES director at [employer])     Family History:  The patient's family history includes Arthritis in his  unknown relative; Diabetes in his brother; Diabetes (age of onset: 2581) in his mother; Hypertension in his mother; Stroke (age of onset: 2841) in his father.   ROS:   Please see the history of present illness.    Claudication, back pain, muscle pain, snoring, leg pain with exertion. All other systems reviewed and are negative.   PHYSICAL EXAM:   VS:  BP 120/62   Pulse 62   Ht 5\' 4"  (1.626 m)   Wt 184 lb 9.6 oz (83.7 kg)   BMI 31.69 kg/m    GEN: Well nourished, well developed, in no acute distress  HEENT: normal  Neck: no  JVD, carotid bruits, or masses Cardiac: RRR; 3-4/6 SEM, with no rubs, or gallops.  Trace to 1+ right greater than left lower extremity edema. Respiratory:  clear to auscultation bilaterally, normal work of breathing GI: soft, nontender, nondistended, + BS MS: no deformity or atrophy  Skin: warm and dry, no rash Neuro:  Alert and Oriented x 3, Strength and sensation are intact Psych: euthymic mood, full affect  Wt Readings from Last 3 Encounters:  10/16/17 184 lb 9.6 oz (83.7 kg)  06/11/17 183 lb (83 kg)  05/08/17 183 lb (83 kg)      Studies/Labs Reviewed:   EKG:  EKG normal sinus rhythm, atrial abnormality, nonspecific T wave flattening, otherwise normal with first-degree AV block at 248 ms.  Since prior tracing T wave abnormality is new.  Recent Labs: 03/26/2017: Pro B Natriuretic peptide (BNP) 102.0; TSH 2.90 06/11/2017: ALT 14; BUN 17; Creatinine, Ser 1.29; Potassium 3.9; Sodium 138   Lipid Panel    Component Value Date/Time   CHOL 187 06/11/2017 1036   CHOL 164 04/04/2015 1059   TRIG 48.0 06/11/2017 1036   TRIG 42 04/04/2015 1059   HDL 80.40 06/11/2017 1036   HDL 82 04/04/2015 1059   CHOLHDL 2 06/11/2017 1036   VLDL 9.6 06/11/2017 1036   LDLCALC 97 06/11/2017 1036   LDLCALC 74 04/04/2015 1059   LDLDIRECT 131.5 11/06/2011 1057    Additional studies/ records that were reviewed today include:  Cardiac catheterization March 2017: Coronary  Diagrams   Diagnostic Diagram        Lower extremity ABI/vascular Doppler January 2019: Final Interpretation: Right: Resting right ankle-brachial index indicates moderate right lower extremity arterial disease. The right toe-brachial index is normal. Left: Resting left ankle-brachial index indicates mild left lower extremity arterial disease. The left toe-brachial index is normal.   *See table(s) above for measurements and observations.   Suggest follow up study in 12 months   2D Doppler echocardiogram March 2018: ------------------------------------------------------------------- Study Conclusions   - Left ventricle: The cavity size was normal. Wall thickness was   increased in a pattern of severe LVH. Systolic function was   normal. The estimated ejection fraction was in the range of 60%   to 65%. Doppler parameters are consistent with abnormal left   ventricular relaxation (grade 1 diastolic dysfunction). Doppler   parameters are consistent with both elevated ventricular   end-diastolic filling pressure and elevated left atrial filling   pressure. - Aortic valve: There was moderate to severe stenosis. There was   mild regurgitation. - Atrial septum: No defect or patent foramen ovale was identified. - Impressions: Small change in mean gradient snce 2017 27 mmHg to   33 mmHg.      ASSESSMENT:    1. Nonrheumatic aortic valve stenosis   2. Coronary artery disease involving native coronary artery of native heart without angina pectoris   3. Essential hypertension   4. Peripheral vascular disease (HCC)   5. Diabetes mellitus due to underlying condition with stage 3 chronic kidney disease, unspecified whether long term insulin use (HCC)      PLAN:  In order of problems listed above:  1. 2D Doppler echocardiogram to exclude progression of aortic stenosis to severe.  He does have dyspnea and angina that limits physical activity in his yard.  Murmur has changed in quality  and I now fear that aortic stenosis is severe. 2. Angina related to underlying CAD and possibly contribution from aortic stenosis. 3. Excellent control on current medical regimen.  Most recent laboratory data reveals a creatinine of 1.29 in October 2018 with potassium of 3.9.  A1c was 6.5 and LDL was 97. 4. Moderate significant bilateral reduction in ABI with claudication, relatively stable.  Encouraged walking challenge collateral growth. 5. Not addressed.  2D Doppler echocardiogram to determine if aortic stenosis is significantly changed.  Encouraged slow walking to improve collateral flow in the lower extremities.  Encouraged to notify us if angina becomes more easily provokable.  Medication Adjustments/Labs and Tests Ordered: Current medicines are reviewed at length with the patient today.  Concerns regarding medicines are outlined above.  Medication changes, Labs and Tests ordered today are listed in the Patient Instructions below. Patient Instructions  Medication Instructions:  Your physician recommends that you continue on your current medications as directed. Please refer to the Current Medication list given to you today.  Labwork: None  Testing/Procedures: Your physician has requested that you have an echocardiogram. Echocardiography is a painless test that uses sound waves to create images of your heart. It provides your doctor with information about the size and shape of your heart and how well your heart's chambers and valves are working. This procedure takes approximately one hour. There are no restrictions for this procedure.   Follow-Up: Your physician wants you to follow-up in: 1 year with Dr. Katrinka Blazing.  You will receive a reminder letter in the mail two months in advance. If you don't receive a letter, please call our office to schedule the follow-up appointment.   Any Other Special Instructions Will Be Listed Below (If Applicable).     If you need a refill on your cardiac  medications before your next appointment, please call your pharmacy.      Signed, Lesleigh Noe, MD  10/16/2017 12:08 PM    [facility] [address] [place], [place], [place]  [address] Phone: [phone]; Fax: [phone]

## 2017-10-16 ENCOUNTER — Encounter: Payer: Self-pay | Admitting: Interventional Cardiology

## 2017-10-16 ENCOUNTER — Ambulatory Visit: Payer: Medicare Other | Admitting: Interventional Cardiology

## 2017-10-16 VITALS — BP 120/62 | HR 62 | Ht 64.0 in | Wt 184.6 lb

## 2017-10-16 DIAGNOSIS — E0822 Diabetes mellitus due to underlying condition with diabetic chronic kidney disease: Secondary | ICD-10-CM

## 2017-10-16 DIAGNOSIS — I350 Nonrheumatic aortic (valve) stenosis: Secondary | ICD-10-CM

## 2017-10-16 DIAGNOSIS — I739 Peripheral vascular disease, unspecified: Secondary | ICD-10-CM | POA: Diagnosis not present

## 2017-10-16 DIAGNOSIS — I10 Essential (primary) hypertension: Secondary | ICD-10-CM

## 2017-10-16 DIAGNOSIS — I2510 Atherosclerotic heart disease of native coronary artery without angina pectoris: Secondary | ICD-10-CM

## 2017-10-16 DIAGNOSIS — N183 Chronic kidney disease, stage 3 (moderate): Secondary | ICD-10-CM

## 2017-10-16 MED ORDER — ISOSORBIDE MONONITRATE ER 60 MG PO TB24: 60.0000 mg | ORAL_TABLET | Freq: Every day | ORAL | 3 refills | Status: DC

## 2017-10-16 NOTE — Patient Instructions (Signed)
Medication Instructions:  Your physician recommends that you continue on your current medications as directed. Please refer to the Current Medication list given to you today.  Labwork: None  Testing/Procedures: Your physician has requested that you have an echocardiogram. Echocardiography is a painless test that uses sound waves to create images of your heart. It provides your doctor with information about the size and shape of your heart and how well your heart's chambers and valves are working. This procedure takes approximately one hour. There are no restrictions for this procedure.   Follow-Up: Your physician wants you to follow-up in: 1 year with Dr. Smith.  You will receive a reminder letter in the mail two months in advance. If you don't receive a letter, please call our office to schedule the follow-up appointment.   Any Other Special Instructions Will Be Listed Below (If Applicable).     If you need a refill on your cardiac medications before your next appointment, please call your pharmacy.   

## 2017-10-19 ENCOUNTER — Other Ambulatory Visit: Payer: Self-pay | Admitting: Cardiology

## 2017-10-21 NOTE — Telephone Encounter (Signed)
REFILL 

## 2017-10-24 ENCOUNTER — Ambulatory Visit (HOSPITAL_COMMUNITY): Payer: Medicare Other | Attending: Internal Medicine

## 2017-10-24 ENCOUNTER — Other Ambulatory Visit: Payer: Self-pay

## 2017-10-24 ENCOUNTER — Other Ambulatory Visit: Payer: Self-pay | Admitting: Neurology

## 2017-10-24 ENCOUNTER — Encounter: Payer: Self-pay | Admitting: Neurology

## 2017-10-24 ENCOUNTER — Ambulatory Visit: Payer: Medicare Other | Admitting: Neurology

## 2017-10-24 VITALS — BP 138/66 | HR 73 | Ht 64.0 in | Wt 181.0 lb

## 2017-10-24 DIAGNOSIS — I10 Essential (primary) hypertension: Secondary | ICD-10-CM | POA: Insufficient documentation

## 2017-10-24 DIAGNOSIS — G4733 Obstructive sleep apnea (adult) (pediatric): Secondary | ICD-10-CM | POA: Diagnosis not present

## 2017-10-24 DIAGNOSIS — I350 Nonrheumatic aortic (valve) stenosis: Secondary | ICD-10-CM | POA: Diagnosis not present

## 2017-10-24 DIAGNOSIS — I2510 Atherosclerotic heart disease of native coronary artery without angina pectoris: Secondary | ICD-10-CM | POA: Diagnosis not present

## 2017-10-24 DIAGNOSIS — Z9989 Dependence on other enabling machines and devices: Secondary | ICD-10-CM

## 2017-10-24 DIAGNOSIS — E1151 Type 2 diabetes mellitus with diabetic peripheral angiopathy without gangrene: Secondary | ICD-10-CM | POA: Insufficient documentation

## 2017-10-24 LAB — ECHOCARDIOGRAM COMPLETE
Height: 64 in
Weight: 2896 oz

## 2017-10-24 NOTE — Progress Notes (Signed)
[name] [address] [place]. [place] [address] [phone]                                                                      SLEEP CLINIC          Ronnie. Ronnie Boyd Date of Birth:  [birth year] Medical Record Number:  [id number]   Referring MD:  Molly Maduro Doolittle/ Delia Heady, MD   Reason for Referral:  Stroke  HPI: Ronnie Boyd was seen during his hospitalization by my colleague, Ronnie Boyd. He had presented with a recent stroke that affected the thalamic region. One of his symptoms was leaning towards the left and weaker side. Ronnie Boyd  had added a baby aspirin to Plavix 75 mg daily , and had explained the goals of control of hypertension blood pressure and cholesterol. The patient was advised that he should partake in regular exercise, moderate diet.  The patient noted that he likes to walk but that he developed cramps in his thighs and both lower extremities , which he attributes to his cholesterol-lowering medication.  Change from Lipitor to Pravachol followed recently. This caused him to have myositis symptoms. I quote from  Doolittle's note dated 03-17-15 that the patient was noted by family to develop excessive sleepiness over the preceding 2 weeks ago . The symptoms lasted for several days before .Ronnie Boyd also noticed that he was slightly off balance, identifying a fall risk. The wife noted his right eyelid to be droopy but there was no vision impairmentHis symptoms had improved and he was also not quite as sleepy anymore , walking better when he was finally receiving an outpatient MRI scan of the brain on 6-20 4-16 which showed a small acute right medial thalamic and midbrain infarct.  The MRI of the brain showed no large vessel stenosis and carotid ultrasound from 6-20 9-16 reviewed by Dr. Pearlean Boyd. showed no significant stenosis in the extracranial parts. He had a transthoracic cardiac echo done which was normal scheduled to undergo cardiac  catheterization with Dr. Marca Ancona.  SLEEP : The patient reports that he has no trouble falling asleep when watching TV in his living room. He is has an easier time sleeping in a seated position or reclined position. When he transfers to the bedroom he struggles to find a comfortable sleep position and to go to sleep. He describes his marital bedroom is cool, quiet and dark. He sleeps on his back on multiple pillows .The patient has to go to the bathroom at least 2-3 times at night and he is taking diabetic medications. Usually can reinitiate sleep quickly after bathroom break. He rises in the morning at about 6:30 AM spontaneously without alarm. He does not describe any morning headaches, he wakes up with a dry mouth. His wife states usually in bed he often returns to the bedroom a nap for another hour. Does not drink any caffeinated beverages. He is retired from his career job but he does keep his  hobby of lawn and garden care.He takes naps, after lunch, for 1 hour . He can easily fall asleep in daytime. He takes an after dinner nap daily. Total sleep time over 24 hours is about 8 hours. He no history of  ENT, neck of facial surgery.  Denies falling asleep driving.  He feels back to baseline.  Interval history, dated 05-23-15 ; Ronnie Boyd underwent a sleep study was split night protocol on 04-19-15 . He  had endorsed the Epworth sleepiness score in the sleep lab at only 3 points but previously in the office endorsed 11 points. He was only asleep for about 50% of the recorded time for the diagnostic part of the study.  He was diagnosed with moderate to severe apnea at an AHI of 28.6 and severe upper airway restriction with an RDI of 55.6. In supine sleep his AHI was 30.4 and unfortunately REM sleep was not recorded. His heart rate varied between 42 and 55.The patient was titrated to CPAP and the AHI became 0.0 under 9 cm water pressure.  Ronnie Boyd is a pleasant 84 year African-American male who was noted by  family to develop excessive sleepiness a few weeks ago. The symptoms lasted for several days before he saw his primary physician who also noticed that he was slightly off balance when walking. The wife and also noticed slight drooping of his right eyelid but the patient denied any diplopia, loss of vision, blurred vision, headache, speech difficulties. Over the last week or so his symptoms have improved and is no longer as sleepy and is walking much better. The patient had an outpatient MRI scan of the brain done on 03/04/15 which have personally reviewed and shows a small acute right medial thalamic/midbrain lacunar infarct. MRA of the brain shows no large vessel and the canal stenosis. Carotid ultrasound done on 03/09/15 reviewed by me shows no significant extracranial stenosis. Patient also had transthoracic echo done which was normal. He had a nuclear medicine scan done 5 admission for chest pain or shortness of breath which was abnormal and is scheduled to undergo cardiac catheterization tomorrow by Dr. Marca Anconaalton McLean. He has no prior history of strokes, TIAs. He was started recently on aspirin 81 mg daily as well as protocol which has taken only for 4 weeks. He denies smoking or drinking excessive alcohol. There is no family history of strokes.  Update 06/22/15 Sethi : He returns today for follow-up after last visit 3 months ago. He has had no recurrent stroke or TIA symptoms and is doing well from neurovascular standpoint. He had lipid profile checked 2 months ago and LDL cholesterol was 74 mg percent and hemoglobin A1c was 6.5. He has seen Dr. Vickey Hugerohmeier and been diagnosed with sleep apnea and started using CPAP regularly which is helping him. He was found to have occlusive cardiac disease and underwent coronary angioplasty stenting by Dr. Kirke CorinArida on 06/01/15 and is doing well after that. He is now on aspirin and Plavix and tolerating it well with only minor bruising but no bleeding. His blood pressure is well  controlled and today it is 138/86.  Update 05/07/2017 Sethi : He returns for follow-up after last visit with me nearly 2 years ago. He continues to do well and has not had recurrent stroke or TIA symptoms now for more than 2 years. He is tolerating Plavix well without bleeding or bruising. His blood pressure is usually well controlled but recently he discontinued his blood pressure medications since he thought he was taking too many medicines. He does have an appointment with his primary care physician tomorrow to really adjust his blood pressure medicines. It is elevated today at 177/81 in office. Patient has a history of statin intolerance. He wasn't protocoled for last  23 years and complains of almost daily cramps. In the past he was on Lipitor for a few years and also had similar muscle cramps. Patient has not had any recurrent stroke or TIA symptoms. He continues to see Dr. Vickey Huger for his sleep apnea and states he is quite compliant with his CPAP every day. He has no new complaints today.  Interval history from 24 October 2017, I see Ronnie Boyd today in a compliance visit for CPAP use.  Ronnie Boyd is a meanwhile 84 year old African-American right-handed gentleman who has been using CPAP for well over 5 years now.  His average use of time is 7 hours and 50 minutes with 100% compliance.  He is using an AutoSet between 5 and 10 cmH2O with 3 cm pressure expiratory relief.  He has mild air leaks, 95th percentile pressure is at 10 cmH2O residual AHI is 1.4 apneas and hypopneas per hour of sleep.  Central apneas are not emerging.  There is no reason for any adjustment. [facility] is his DME.  Ronnie Boyd reports that his wife suffers from Parkinson's Disease and his family relations live in [place]. He is still very active, drives and appears energetic.    ROS:  14 system review of systems is positive for leg swelling, constipation and all other systems negative:  no snoring, no nocturia, hypersomnia is  controlled. Highly compliant CPAP user.     PMH:  Past Medical History:  Diagnosis Date  . Allergic rhinitis   . Aortic stenosis    mild echo 7/14  . Borderline type 2 diabetes mellitus   . Coronary artery disease   . Degenerative joint disease   . Family history of adverse reaction to anesthesia    "daughter had problems when she was a teen; none since; it was related to asthma"  . GERD (gastroesophageal reflux disease)   . Hemorrhoids   . History of hiatal hernia    "dx'd years ago" (06/01/2015)  . Hypercholesteremia   . Hypertension   . OSA on CPAP since 05/23/2015  . Prostatitis    hx of  . PVD (peripheral vascular disease) (HCC)   . Stroke [facility])     Social History:  Social History   Socioeconomic History  . Marital status: Married    Spouse name: Not on file  . Number of children: Not on file  . Years of education: Not on file  . Highest education level: Not on file  Social Needs  . Financial resource strain: Not on file  . Food insecurity - worry: Not on file  . Food insecurity - inability: Not on file  . Transportation needs - medical: Not on file  . Transportation needs - non-medical: Not on file  Occupational History  . Occupation: retired  Tobacco Use  . Smoking status: Never Smoker  . Smokeless tobacco: Never Used  Substance and Sexual Activity  . Alcohol use: No  . Drug use: No  . Sexual activity: Not Currently  Other Topics Concern  . Not on file  Social History Narrative   Daughter is [name] Psychologist, sport and exercise at [employer])    Medications:   Current Outpatient Medications on File Prior to Visit  Medication Sig Dispense Refill  . albuterol (PROVENTIL HFA;VENTOLIN HFA) 108 (90 Base) MCG/ACT inhaler Inhale 2 puffs into the lungs every 6 (six) hours as needed for wheezing or shortness of breath. 1 Inhaler 0  . alum & mag hydroxide-simeth (MAALOX/MYLANTA) 200-200-20 MG/5ML suspension Take 15 mLs  by mouth every 6 (six) hours as needed  for indigestion or heartburn.    Marland Kitchen amLODipine (NORVASC) 5 MG tablet Take 1 tablet (5 mg total) by mouth daily. 90 tablet 3  . aspirin 81 MG chewable tablet Chew by mouth daily.    . Coenzyme Q10 200 MG capsule Take 200 mg by mouth daily.     . CRESTOR 10 MG tablet Take 1 tablet (10 mg total) by mouth daily. 90 tablet 3  . fluticasone (FLONASE) 50 MCG/ACT nasal spray Place 2 sprays into both nostrils daily. 16 g 0  . Garlic Oil 1000 MG CAPS Take 1 capsule by mouth daily.      . isosorbide mononitrate (IMDUR) 60 MG 24 hr tablet Take 1 tablet (60 mg total) by mouth daily. 90 tablet 3  . loratadine (CLARITIN) 10 MG tablet Take 10 mg by mouth daily as needed for rhinitis.     Marland Kitchen losartan-hydrochlorothiazide (HYZAAR) 50-12.5 MG tablet Take 1 tablet by mouth daily. 90 tablet 3  . metoprolol tartrate (LOPRESSOR) 25 MG tablet Take 1 tablet (25 mg total) by mouth 2 (two) times daily. 180 tablet 3  . Multiple Vitamins-Minerals (CENTRUM SILVER) tablet Take 1 tablet by mouth daily.      . nitroGLYCERIN (NITROSTAT) 0.4 MG SL tablet PLACE 1 TABLET (0.4 MG TOTAL) UNDER THE TONGUE EVERY 5 (FIVE) MINUTES AS NEEDED FOR CHEST PAIN. 25 tablet 3  . Omega-3 Fatty Acids (FISH OIL) 1000 MG CAPS Take 1 capsule by mouth daily.      . pantoprazole (PROTONIX) 40 MG tablet TAKE 1 TABLET (40 MG TOTAL) BY MOUTH DAILY. 30 tablet 11  . ranolazine (RANEXA) 500 MG 12 hr tablet Take 1 tablet (500 mg total) by mouth 2 (two) times daily. Please keep upcoming appt with Dr. Katrinka Blazing in February. Thank you 180 tablet 0  . tamsulosin (FLOMAX) 0.4 MG CAPS capsule Take 0.4 mg by mouth daily.     No current facility-administered medications on file prior to visit.     Allergies:   Allergies  Allergen Reactions  . Lipitor [Atorvastatin]     Leg cramping  . Other     Environmental   . Pravastatin     Leg cramping    Physical Exam  Neck circumference 19.5 inches " Mallampati 4 . Retrognathia.  BMI is elevated.   General: Mildly  obese elderly right handed African-American male, seated, in no evident distress, well groomed.  Head: head normocephalic and atraumatic.   Neck: supple with no carotid or supraclavicular bruits Cardiovascular: regular rate and rhythm, harsh ejection systolic murmur heard all over the precordium and on the right side of the sternum Musculoskeletal: no deformity Skin:  no rash/petichiae Vascular:  Normal pulses all extremities  Neurologic Exam Mental Status: Awake and fully alert.  Ronnie. Borcherding speech is fluent and sonor, there is no sign of dysphonia, aphasia.  Oriented to place and time. Recent and remote memory intact. Attention span, concentration and fund of knowledge appropriate. Mood and affect appropriate.  Cranial Nerves: Fundoscopic exam deferred -Pupils equal, briskly reactive to light. Arcus senilis.  Extraocular movements full without nystagmus. There is no eyelid drooping ( ptosis)   Noted-  Visual fields full to confrontation. Hearing intact.  Facial sensation intact. Face, tongue, palate moves normally and symmetrically.  Motor: Normal bulk and tone. Normal strength in all tested extremity muscles. Sensory.: intact to touch , pinprick , position and vibratory sensation- primary modalities. .  Coordination:Finger-to-nose performed accurately bilaterally. Gait  and Station: Arises from chair without difficulty, not bracing himself. Stance is normal. Reflexes: 1+ and symmetric.    ASSESSMENT: 84 year old very  pleasant African-American male with history of  excessive daytime sleepiness, transient drooping of the right eyelid and gait and balance difficulties-  due to right thalamic/midbrain lacunar infarct likely from small vessel disease with multiple vascular risk factors of hypertension, hyperlipidemia, obesity, age and  sleep apnea.  Ronnie. Isbell has been 100% compliant user for many years on CPAP is now using an AutoSet between 5 and 10 cmH2O with 3 cm expiratory pressure relief, average  use of time 7 hours 50 minutes, residual AHI 1.4 as of October 22, 2017.  No adjustments have to be made the patient feels that his current interface is comfortable.     PLAN:  Continue CPAP use, I will follow Ronnie Tiedt every other year alternating with Np/ he follows stroke MD.    Melvyn Novas, MD

## 2017-12-08 NOTE — Patient Instructions (Addendum)

## 2017-12-08 NOTE — Progress Notes (Addendum)
Subjective:    Patient ID: Ronnie Boyd, male    DOB: [birth year], 84 y.o.   MRN: [id number]  HPI The patient is here for follow up.  Scratching sensation in right upper back/posterior shoulder:  It has been there for a couple of months.  It is intermittent.  He wondered if it was related to his cardiac stents.  His last stent was placed a few years ago.  He denies any pain.  He has felt some muscle twitching in that area at times.  Diabetes: He is taking his medication daily as prescribed. He is compliant with a diabetic diet. He is exercising-walking some. He checks his feet daily and denies foot lesions. He is up-to-date with an ophthalmology examination.   Hyperlipidemia: He is taking his medication daily. He is compliant with a low fat/cholesterol diet. He is exercising some - walking. He denies myalgias.   GERD:  He is taking his medication daily as prescribed.  He denies any GERD symptoms and feels his GERD is well controlled.   CAD, hypertension: He is taking his medication daily. He is compliant with a low sodium diet.  He denies chest pain, palpitations, edema, shortness of breath and regular headaches. He is exercising minimally - doing some walking.  He does monitor his blood pressure at home and it is well controlled.    Leg edema: He feels his leg edema is currently controlled.  He has noticed that the skin around his ankles is darker.  He is taking his blood pressure medication daily as prescribed.  OSA:  He is using his cpap nightly.    Medications and allergies reviewed with patient and updated if appropriate.  Patient Active Problem List   Diagnosis Date Noted  . History of DVT of lower extremity 06/12/2017  . Myalgia 06/11/2017  . Bilateral leg edema 05/08/2017  . Urinary frequency 04/16/2016  . Coronary artery disease involving native coronary artery   . Diabetes (HCC) 11/09/2015  . OSA on CPAP 07/20/2015  . History of stroke, lacunar 2016 03/24/2015  .  Aortic stenosis, moderate 2017 01/08/2011  . HYPERCHOLESTEROLEMIA 12/01/2007  . Essential hypertension 12/01/2007  . Peripheral vascular disease (HCC) 12/01/2007  . HEMORRHOIDS 12/01/2007  . Allergic rhinitis 12/01/2007  . GERD 12/01/2007  . DEGENERATIVE JOINT DISEASE 12/01/2007    Current Outpatient Medications on File Prior to Visit  Medication Sig Dispense Refill  . albuterol (PROVENTIL HFA;VENTOLIN HFA) 108 (90 Base) MCG/ACT inhaler Inhale 2 puffs into the lungs every 6 (six) hours as needed for wheezing or shortness of breath. 1 Inhaler 0  . amLODipine (NORVASC) 5 MG tablet Take 1 tablet (5 mg total) by mouth daily. 90 tablet 3  . aspirin 81 MG chewable tablet Chew by mouth daily.    . clopidogrel (PLAVIX) 75 MG tablet TAKE 1 TABLET (75 MG TOTAL) BY MOUTH DAILY. 90 tablet 3  . Coenzyme Q10 200 MG capsule Take 200 mg by mouth daily.     . CRESTOR 10 MG tablet Take 1 tablet (10 mg total) by mouth daily. 90 tablet 3  . fluticasone (FLONASE) 50 MCG/ACT nasal spray Place 2 sprays into both nostrils daily. 16 g 0  . Garlic Oil 1000 MG CAPS Take 1 capsule by mouth daily.      . isosorbide mononitrate (IMDUR) 60 MG 24 hr tablet Take 1 tablet (60 mg total) by mouth daily. 90 tablet 3  . loratadine (CLARITIN) 10 MG tablet Take 10 mg by mouth daily  as needed for rhinitis.     Marland Kitchen. losartan-hydrochlorothiazide (HYZAAR) 50-12.5 MG tablet Take 1 tablet by mouth daily. 90 tablet 3  . metoprolol tartrate (LOPRESSOR) 25 MG tablet Take 1 tablet (25 mg total) by mouth 2 (two) times daily. 180 tablet 3  . Multiple Vitamins-Minerals (CENTRUM SILVER) tablet Take 1 tablet by mouth daily.      . nitroGLYCERIN (NITROSTAT) 0.4 MG SL tablet PLACE 1 TABLET (0.4 MG TOTAL) UNDER THE TONGUE EVERY 5 (FIVE) MINUTES AS NEEDED FOR CHEST PAIN. 25 tablet 3  . Omega-3 Fatty Acids (FISH OIL) 1000 MG CAPS Take 1 capsule by mouth daily.      . pantoprazole (PROTONIX) 40 MG tablet TAKE 1 TABLET (40 MG TOTAL) BY MOUTH DAILY. 30  tablet 11  . ranolazine (RANEXA) 500 MG 12 hr tablet Take 1 tablet (500 mg total) by mouth 2 (two) times daily. Please keep upcoming appt with Dr. Katrinka BlazingSmith in February. Thank you 180 tablet 0  . tamsulosin (FLOMAX) 0.4 MG CAPS capsule Take 0.4 mg by mouth daily.     No current facility-administered medications on file prior to visit.     Past Medical History:  Diagnosis Date  . Allergic rhinitis   . Aortic stenosis    mild echo 7/14  . Borderline type 2 diabetes mellitus   . Coronary artery disease   . Degenerative joint disease   . Family history of adverse reaction to anesthesia    "daughter had problems when she was a teen; none since; it was related to asthma"  . GERD (gastroesophageal reflux disease)   . Hemorrhoids   . History of hiatal hernia    "dx'd years ago" (06/01/2015)  . Hypercholesteremia   . Hypertension   . OSA on CPAP since 05/23/2015  . Prostatitis    hx of  . PVD (peripheral vascular disease) (HCC)   . Stroke [facility](HCC)     Past Surgical History:  Procedure Laterality Date  . CARDIAC CATHETERIZATION [name] 03/18/2015   Procedure: Left Heart Cath and Coronary Angiography;  Surgeon: Laurey Moralealton S McLean, MD;  [facility];  Service: Cardiovascular;  Laterality: [name];  . CARDIAC CATHETERIZATION [name] 06/01/2015   Procedure: Coronary Stent Intervention;  Surgeon: Iran OuchMuhammad A Arida, MD;  Location: [facility] INVASIVE CV LAB;  Service: Cardiovascular;  Laterality: [name];  . CARDIAC CATHETERIZATION  11/11/2015  . CARDIAC CATHETERIZATION [name] 11/11/2015   Procedure: Intravascular Pressure Wire/FFR Study;  Surgeon: Tonny BollmanMichael Cooper, MD;  [facility];  Service: Cardiovascular;  Laterality: [name];  . CARPAL TUNNEL RELEASE Bilateral 2006-2007  . CATARACT EXTRACTION W/ INTRAOCULAR LENS  IMPLANT, BILATERAL Bilateral ~ 2003  . EXCISIONAL HEMORRHOIDECTOMY  1989    Social History   Socioeconomic History  . Marital status: Married    Spouse name: Not on file  . Number of  children: Not on file  . Years of education: Not on file  . Highest education level: Not on file  Occupational History  . Occupation: retired  Engineer, productionocial Needs  . Financial resource strain: Not on file  . Food insecurity:    Worry: Not on file    Inability: Not on file  . Transportation needs:    Medical: Not on file    Non-medical: Not on file  Tobacco Use  . Smoking status: Never Smoker  . Smokeless tobacco: Never Used  Substance and Sexual Activity  . Alcohol use: No  . Drug use: No  . Sexual activity: Not Currently  Lifestyle  .  Physical activity:    Days per week: Not on file    Minutes per session: Not on file  . Stress: Not on file  Relationships  . Social connections:    Talks on phone: Not on file    Gets together: Not on file    Attends religious service: Not on file    Active member of club or organization: Not on file    Attends meetings of clubs or organizations: Not on file    Relationship status: Not on file  Other Topics Concern  . Not on file  Social History Narrative   Daughter is [name] Psychologist, sport and exercise at [employer])    Family History  Problem Relation Age of Onset  . Hypertension Mother   . Diabetes Mother 21  . Stroke Father 63  . Arthritis Unknown   . Diabetes Brother     Review of Systems  Constitutional: Negative for chills, fatigue and fever.  Respiratory: Negative for cough, shortness of breath and wheezing.   Cardiovascular: Positive for chest pain (chest tightness with moving - goes away with rest), palpitations (occ) and leg swelling (mild, controlled).  Neurological: Negative for light-headedness and headaches.       Objective:   Vitals:   12/10/17 0947  BP: (!) 142/72  Pulse: 70  Resp: 16  Temp: 97.9 F (36.6 C)  SpO2: 96%   BP Readings from Last 3 Encounters:  12/10/17 (!) 142/72  10/24/17 138/66  10/16/17 120/62   Wt Readings from Last 3 Encounters:  12/10/17 182 lb (82.6 kg)  10/24/17 181 lb (82.1  kg)  10/16/17 184 lb 9.6 oz (83.7 kg)   Body mass index is 31.24 kg/m.   Physical Exam    Constitutional: Appears well-developed and well-nourished. No distress.  HENT:  Head: Normocephalic and atraumatic.  Neck: Neck supple. No tracheal deviation present. No thyromegaly present.  No cervical lymphadenopathy Cardiovascular: Normal rate, regular rhythm and normal heart sounds.   2/6 systolic murmur heard. No carotid bruit .  Trace ankle edema bilaterally Pulmonary/Chest: Effort normal and breath sounds normal. No respiratory distress. No has no wheezes. No rales.  Skin: Skin is warm and dry. Not diaphoretic.  Psychiatric: Normal mood and affect. Behavior is normal.      Assessment & Plan:    See Problem List for Assessment and Plan of chronic medical problems.

## 2017-12-10 ENCOUNTER — Ambulatory Visit: Payer: Medicare Other | Admitting: Internal Medicine

## 2017-12-10 ENCOUNTER — Encounter: Payer: Self-pay | Admitting: Internal Medicine

## 2017-12-10 VITALS — BP 142/72 | HR 70 | Temp 97.9°F | Resp 16 | Wt 182.0 lb

## 2017-12-10 DIAGNOSIS — R600 Localized edema: Secondary | ICD-10-CM | POA: Diagnosis not present

## 2017-12-10 DIAGNOSIS — K219 Gastro-esophageal reflux disease without esophagitis: Secondary | ICD-10-CM | POA: Diagnosis not present

## 2017-12-10 DIAGNOSIS — I2510 Atherosclerotic heart disease of native coronary artery without angina pectoris: Secondary | ICD-10-CM | POA: Diagnosis not present

## 2017-12-10 DIAGNOSIS — I10 Essential (primary) hypertension: Secondary | ICD-10-CM | POA: Diagnosis not present

## 2017-12-10 DIAGNOSIS — N183 Chronic kidney disease, stage 3 (moderate): Secondary | ICD-10-CM | POA: Diagnosis not present

## 2017-12-10 DIAGNOSIS — G4733 Obstructive sleep apnea (adult) (pediatric): Secondary | ICD-10-CM | POA: Diagnosis not present

## 2017-12-10 DIAGNOSIS — Z9989 Dependence on other enabling machines and devices: Secondary | ICD-10-CM

## 2017-12-10 DIAGNOSIS — E7800 Pure hypercholesterolemia, unspecified: Secondary | ICD-10-CM | POA: Diagnosis not present

## 2017-12-10 DIAGNOSIS — E0822 Diabetes mellitus due to underlying condition with diabetic chronic kidney disease: Secondary | ICD-10-CM

## 2017-12-10 NOTE — Assessment & Plan Note (Signed)
Compliant with cpap - using his nightly

## 2017-12-10 NOTE — Assessment & Plan Note (Signed)
Eye exams up-to-date Sugars controlled with diet Encouraged him to continue regular exercise Check A1c

## 2017-12-10 NOTE — Assessment & Plan Note (Signed)
Following with cardiology Does have occasional chest tightness with activity, which is relieved with rest-he has discussed this with cardiology Occasional palpitations, but no shortness of breath Continue current medications CMP, CBC, lipids

## 2017-12-10 NOTE — Assessment & Plan Note (Addendum)
Blood pressure slightly elevated here today, but is typically better controlled Continue current medications at current doses Encouraged continuing to walk regularly and increase if possible CMP, CBC

## 2017-12-10 NOTE — Assessment & Plan Note (Signed)
GERD controlled Continue daily medication  

## 2017-12-10 NOTE — Assessment & Plan Note (Signed)
Controlled Taking hydrochlorothiazide 12.5 mg daily We will continue

## 2017-12-10 NOTE — Assessment & Plan Note (Signed)
Check lipid panel  Continue daily statin Regular exercise and healthy diet encouraged  

## 2017-12-16 ENCOUNTER — Other Ambulatory Visit (INDEPENDENT_AMBULATORY_CARE_PROVIDER_SITE_OTHER): Payer: Medicare Other

## 2017-12-16 ENCOUNTER — Encounter: Payer: Self-pay | Admitting: Internal Medicine

## 2017-12-16 DIAGNOSIS — N183 Chronic kidney disease, stage 3 (moderate): Secondary | ICD-10-CM | POA: Diagnosis not present

## 2017-12-16 DIAGNOSIS — E7800 Pure hypercholesterolemia, unspecified: Secondary | ICD-10-CM | POA: Diagnosis not present

## 2017-12-16 DIAGNOSIS — I10 Essential (primary) hypertension: Secondary | ICD-10-CM | POA: Diagnosis not present

## 2017-12-16 DIAGNOSIS — R600 Localized edema: Secondary | ICD-10-CM | POA: Diagnosis not present

## 2017-12-16 DIAGNOSIS — E0822 Diabetes mellitus due to underlying condition with diabetic chronic kidney disease: Secondary | ICD-10-CM | POA: Diagnosis not present

## 2017-12-16 LAB — COMPREHENSIVE METABOLIC PANEL
ALK PHOS: 34 U/L — AB (ref 39–117)
ALT: 14 U/L (ref 0–53)
AST: 17 U/L (ref 0–37)
Albumin: 4.1 g/dL (ref 3.5–5.2)
BILIRUBIN TOTAL: 0.8 mg/dL (ref 0.2–1.2)
BUN: 19 mg/dL (ref 6–23)
CO2: 26 mEq/L (ref 19–32)
Calcium: 9.2 mg/dL (ref 8.4–10.5)
Chloride: 103 mEq/L (ref 96–112)
Creatinine, Ser: 1.39 mg/dL (ref 0.40–1.50)
GFR: 61.97 mL/min (ref >60.00)
GLUCOSE: 90 mg/dL (ref 70–99)
Potassium: 3.7 mEq/L (ref 3.5–5.1)
Sodium: 137 mEq/L (ref 135–145)
TOTAL PROTEIN: 7.4 g/dL (ref 6.0–8.3)

## 2017-12-16 LAB — CBC WITH DIFFERENTIAL/PLATELET
BASOS ABS: 0 10*3/uL (ref 0.0–0.1)
Basophils Relative: 0.8 % (ref 0.0–3.0)
EOS ABS: 0.2 10*3/uL (ref 0.0–0.7)
Eosinophils Relative: 3.8 % (ref 0.0–5.0)
HCT: 41.5 % (ref 39.0–52.0)
Hemoglobin: 13.6 g/dL (ref 13.0–17.0)
LYMPHS ABS: 1.1 10*3/uL (ref 0.7–4.0)
Lymphocytes Relative: 24.3 % (ref 12.0–46.0)
MCHC: 32.7 g/dL (ref 30.0–36.0)
MCV: 83.9 fl (ref 78.0–100.0)
Monocytes Absolute: 0.9 10*3/uL (ref 0.1–1.0)
Monocytes Relative: 19.1 % — ABNORMAL HIGH (ref 3.0–12.0)
NEUTROS PCT: 52 % (ref 43.0–77.0)
Neutro Abs: 2.4 10*3/uL (ref 1.4–7.7)
Platelets: 157 10*3/uL (ref 150.0–400.0)
RBC: 4.94 Mil/uL (ref 4.22–5.81)
RDW: 15.2 % (ref 11.5–15.5)
WBC: 4.6 10*3/uL (ref 4.0–10.5)

## 2017-12-16 LAB — LIPID PANEL
CHOL/HDL RATIO: 2
Cholesterol: 164 mg/dL (ref 0–200)
HDL: 81.3 mg/dL (ref >39.00)
LDL Cholesterol: 74 mg/dL (ref 0–99)
NONHDL: 82.51
Triglycerides: 41 mg/dL (ref 0.0–149.0)
VLDL: 8.2 mg/dL (ref 0.0–40.0)

## 2017-12-16 LAB — HEMOGLOBIN A1C: Hgb A1c MFr Bld: 6.6 % — ABNORMAL HIGH (ref 4.6–6.5)

## 2017-12-23 ENCOUNTER — Other Ambulatory Visit: Payer: Self-pay | Admitting: Interventional Cardiology

## 2017-12-31 ENCOUNTER — Telehealth: Payer: Self-pay | Admitting: Emergency Medicine

## 2017-12-31 NOTE — Telephone Encounter (Signed)
Called patient to schedule AWV. Patient stated he would give us a call back to schedule sometime after 06/12/18.

## 2018-04-08 ENCOUNTER — Other Ambulatory Visit: Payer: Self-pay | Admitting: Physician Assistant

## 2018-05-14 ENCOUNTER — Other Ambulatory Visit: Payer: Self-pay | Admitting: Internal Medicine

## 2018-05-15 ENCOUNTER — Other Ambulatory Visit: Payer: Self-pay | Admitting: Internal Medicine

## 2018-05-15 MED ORDER — CRESTOR 10 MG PO TABS: 10.0000 mg | ORAL_TABLET | Freq: Every day | ORAL | 2 refills | Status: DC

## 2018-05-19 ENCOUNTER — Other Ambulatory Visit: Payer: Self-pay | Admitting: Internal Medicine

## 2018-06-10 NOTE — Patient Instructions (Addendum)
  Tests ordered today. Your results will be released to MyChart (or called to you) after review, usually within 72hours after test completion. If any changes need to be made, you will be notified at that same time.  Flu immunization administered today.    Medications reviewed and updated.  Changes include :   Stopping crestor and trying pitavastatin for your cholesterol.  Your prescription(s) have been submitted to your pharmacy. Please take as directed and contact our office if you believe you are having problem(s) with the medication(s).   Please followup in 6 months

## 2018-06-10 NOTE — Progress Notes (Signed)
Subjective:    Patient ID: Ronnie Boyd, male    DOB: March 28, 1927, 84 y.o.   MRN: [id number]  HPI The patient is here for follow up.  Diabetes: He is taking his medication daily as prescribed. He is compliant with a diabetic diet. He is exercising regularly-walking.   He is up-to-date with an ophthalmology examination.   Hyperlipidemia: He is taking his medication daily. He is compliant with a low fat/cholesterol diet. He is exercising regularly. He denies myalgias.   CAD, chronic leg edema, Hypertension: He is taking his medication daily. He is compliant with a low sodium diet.  He does experience some chest pain and shortness of breath with more strenuous walking and he has discussed that with his cardiologist.  This is unchanged.  He also has some leg swelling with increased salt intake.  He denies Palpitations, and regular headaches. He is exercising regularly.      GERD:  He is taking his medication daily as prescribed.  He denies any GERD symptoms and feels his GERD is well controlled.    Medications and allergies reviewed with patient and updated if appropriate.  Patient Active Problem List   Diagnosis Date Noted  . History of DVT of lower extremity 06/12/2017  . Myalgia 06/11/2017  . Bilateral leg edema 05/08/2017  . Urinary frequency 04/16/2016  . Coronary artery disease involving native coronary artery   . Diabetes (HCC) 11/09/2015  . OSA on CPAP 07/20/2015  . History of stroke, lacunar 2016 03/24/2015  . Aortic stenosis, moderate 2017 01/08/2011  . HYPERCHOLESTEROLEMIA 12/01/2007  . Essential hypertension 12/01/2007  . Peripheral vascular disease (HCC) 12/01/2007  . HEMORRHOIDS 12/01/2007  . Allergic rhinitis 12/01/2007  . GERD 12/01/2007  . DEGENERATIVE JOINT DISEASE 12/01/2007    Current Outpatient Medications on File Prior to Visit  Medication Sig Dispense Refill  . albuterol (PROVENTIL HFA;VENTOLIN HFA) 108 (90 Base) MCG/ACT inhaler Inhale 2 puffs into the  lungs every 6 (six) hours as needed for wheezing or shortness of breath. 1 Inhaler 0  . amLODipine (NORVASC) 5 MG tablet TAKE 1 TABLET BY MOUTH EVERY DAY 90 tablet 1  . aspirin 81 MG chewable tablet Chew by mouth daily.    . clopidogrel (PLAVIX) 75 MG tablet TAKE 1 TABLET (75 MG TOTAL) BY MOUTH DAILY. 90 tablet 3  . Coenzyme Q10 200 MG capsule Take 200 mg by mouth daily.     . fluticasone (FLONASE) 50 MCG/ACT nasal spray Place 2 sprays into both nostrils daily. 16 g 0  . Garlic Oil 1000 MG CAPS Take 1 capsule by mouth daily.      . isosorbide mononitrate (IMDUR) 60 MG 24 hr tablet Take 1 tablet (60 mg total) by mouth daily. 90 tablet 3  . loratadine (CLARITIN) 10 MG tablet Take 10 mg by mouth daily as needed for rhinitis.     Marland Kitchen losartan-hydrochlorothiazide (HYZAAR) 50-12.5 MG tablet TAKE 1 TABLET BY MOUTH EVERY DAY 90 tablet 1  . metoprolol tartrate (LOPRESSOR) 25 MG tablet Take 1 tablet (25 mg total) by mouth 2 (two) times daily. 180 tablet 3  . Multiple Vitamins-Minerals (CENTRUM SILVER) tablet Take 1 tablet by mouth daily.      . nitroGLYCERIN (NITROSTAT) 0.4 MG SL tablet PLACE 1 TABLET (0.4 MG TOTAL) UNDER THE TONGUE EVERY 5 (FIVE) MINUTES AS NEEDED FOR CHEST PAIN. 25 tablet 3  . Omega-3 Fatty Acids (FISH OIL) 1000 MG CAPS Take 1 capsule by mouth daily.      Marland Kitchen  pantoprazole (PROTONIX) 40 MG tablet TAKE 1 TABLET (40 MG TOTAL) BY MOUTH DAILY. 30 tablet 11  . ranolazine (RANEXA) 500 MG 12 hr tablet Take 1 tablet (500 mg total) by mouth 2 (two) times daily. 180 tablet 3  . tamsulosin (FLOMAX) 0.4 MG CAPS capsule Take 0.4 mg by mouth daily.     No current facility-administered medications on file prior to visit.     Past Medical History:  Diagnosis Date  . Allergic rhinitis   . Aortic stenosis    mild echo 7/14  . Borderline type 2 diabetes mellitus   . Coronary artery disease   . Degenerative joint disease   . Family history of adverse reaction to anesthesia    "daughter had problems  when she was a teen; none since; it was related to asthma"  . GERD (gastroesophageal reflux disease)   . Hemorrhoids   . History of hiatal hernia    "dx'd years ago" (06/01/2015)  . Hypercholesteremia   . Hypertension   . OSA on CPAP since 05/23/2015  . Prostatitis    hx of  . PVD (peripheral vascular disease) (HCC)   . Stroke [facility])     Past Surgical History:  Procedure Laterality Date  . CARDIAC CATHETERIZATION [name] 03/18/2015   Procedure: Left Heart Cath and Coronary Angiography;  Surgeon: Laurey Morale, MD;  Location: [facility] INVASIVE CV LAB;  Service: Cardiovascular;  Laterality: [name];  . CARDIAC CATHETERIZATION [name] 06/01/2015   Procedure: Coronary Stent Intervention;  Surgeon: Iran Ouch, MD;  Location: [facility] INVASIVE CV LAB;  Service: Cardiovascular;  Laterality: [name];  . CARDIAC CATHETERIZATION  11/11/2015  . CARDIAC CATHETERIZATION [name] 11/11/2015   Procedure: Intravascular Pressure Wire/FFR Study;  Surgeon: Tonny Bollman, MD;  Location: [facility] INVASIVE CV LAB;  Service: Cardiovascular;  Laterality: [name];  . CARPAL TUNNEL RELEASE Bilateral 2006-2007  . CATARACT EXTRACTION W/ INTRAOCULAR LENS  IMPLANT, BILATERAL Bilateral ~ 2003  . EXCISIONAL HEMORRHOIDECTOMY  1989    Social History   Socioeconomic History  . Marital status: Married    Spouse name: Not on file  . Number of children: Not on file  . Years of education: Not on file  . Highest education level: Not on file  Occupational History  . Occupation: retired  Engineer, production  . Financial resource strain: Not on file  . Food insecurity:    Worry: Not on file    Inability: Not on file  . Transportation needs:    Medical: Not on file    Non-medical: Not on file  Tobacco Use  . Smoking status: Never Smoker  . Smokeless tobacco: Never Used  Substance and Sexual Activity  . Alcohol use: No  . Drug use: No  . Sexual activity: Not Currently  Lifestyle  . Physical activity:    Days per week: Not on file    Minutes per session:  Not on file  . Stress: Not on file  Relationships  . Social connections:    Talks on phone: Not on file    Gets together: Not on file    Attends religious service: Not on file    Active member of club or organization: Not on file    Attends meetings of clubs or organizations: Not on file    Relationship status: Not on file  Other Topics Concern  . Not on file  Social History Narrative   Daughter is [name] Psychologist, sport and exercise at [employer])    Family History  Problem Relation Age  of Onset  . Hypertension Mother   . Diabetes Mother 98  . Stroke Father 66  . Arthritis Unknown   . Diabetes Brother     Review of Systems  Constitutional: Negative for chills and fever.  Respiratory: Positive for shortness of breath (strenuous exercise only). Negative for cough and wheezing.   Cardiovascular: Positive for chest pain (with more strenuous exertion - has discussed with cardioogy) and leg swelling (related to salt). Negative for palpitations.  Musculoskeletal:       Leg cramping  Neurological: Negative for light-headedness and headaches.       Objective:   Vitals:   06/11/18 1009  BP: (!) 144/80  Pulse: 72  Resp: 16  Temp: 98.4 F (36.9 C)  SpO2: 98%   BP Readings from Last 3 Encounters:  06/11/18 (!) 144/80  12/10/17 (!) 142/72  10/24/17 138/66   Wt Readings from Last 3 Encounters:  06/11/18 179 lb 12.8 oz (81.6 kg)  12/10/17 182 lb (82.6 kg)  10/24/17 181 lb (82.1 kg)   Body mass index is 30.86 kg/m.   Physical Exam    Constitutional: Appears well-developed and well-nourished. No distress.  HENT:  Head: Normocephalic and atraumatic.  Neck: Neck supple. No tracheal deviation present. No thyromegaly present.  No cervical lymphadenopathy Cardiovascular: Normal rate, regular rhythm and normal heart sounds.   2/6 systolic  murmur heard. No carotid bruit .  Mild pitting b/l LE edema Pulmonary/Chest: Effort normal and breath sounds normal. No respiratory  distress. No has no wheezes. No rales.  Skin: Skin is warm and dry. Not diaphoretic.  Psychiatric: Normal mood and affect. Behavior is normal.      Assessment & Plan:    See Problem List for Assessment and Plan of chronic medical problems.

## 2018-06-11 ENCOUNTER — Ambulatory Visit: Payer: Medicare Other | Admitting: Internal Medicine

## 2018-06-11 ENCOUNTER — Encounter: Payer: Self-pay | Admitting: Internal Medicine

## 2018-06-11 ENCOUNTER — Other Ambulatory Visit (INDEPENDENT_AMBULATORY_CARE_PROVIDER_SITE_OTHER): Payer: Medicare Other

## 2018-06-11 VITALS — BP 144/80 | HR 72 | Temp 98.4°F | Resp 16 | Ht 64.0 in | Wt 179.8 lb

## 2018-06-11 DIAGNOSIS — I10 Essential (primary) hypertension: Secondary | ICD-10-CM | POA: Diagnosis not present

## 2018-06-11 DIAGNOSIS — E0822 Diabetes mellitus due to underlying condition with diabetic chronic kidney disease: Secondary | ICD-10-CM

## 2018-06-11 DIAGNOSIS — N183 Chronic kidney disease, stage 3 (moderate): Secondary | ICD-10-CM | POA: Diagnosis not present

## 2018-06-11 DIAGNOSIS — E7800 Pure hypercholesterolemia, unspecified: Secondary | ICD-10-CM

## 2018-06-11 DIAGNOSIS — Z23 Encounter for immunization: Secondary | ICD-10-CM

## 2018-06-11 LAB — LIPID PANEL
CHOLESTEROL: 157 mg/dL (ref 0–200)
HDL: 74.7 mg/dL (ref >39.00)
LDL CALC: 71 mg/dL (ref 0–99)
NonHDL: 81.83
TRIGLYCERIDES: 53 mg/dL (ref 0.0–149.0)
Total CHOL/HDL Ratio: 2
VLDL: 10.6 mg/dL (ref 0.0–40.0)

## 2018-06-11 LAB — HEMOGLOBIN A1C: Hgb A1c MFr Bld: 6.3 % (ref 4.6–6.5)

## 2018-06-11 LAB — COMPREHENSIVE METABOLIC PANEL
ALBUMIN: 4 g/dL (ref 3.5–5.2)
ALK PHOS: 36 U/L — AB (ref 39–117)
ALT: 13 U/L (ref 0–53)
AST: 17 U/L (ref 0–37)
BUN: 16 mg/dL (ref 6–23)
CO2: 28 mEq/L (ref 19–32)
CREATININE: 1.24 mg/dL (ref 0.40–1.50)
Calcium: 9.3 mg/dL (ref 8.4–10.5)
Chloride: 105 mEq/L (ref 96–112)
GFR: 70.62 mL/min (ref >60.00)
Glucose, Bld: 98 mg/dL (ref 70–99)
Potassium: 4 mEq/L (ref 3.5–5.1)
Sodium: 138 mEq/L (ref 135–145)
TOTAL PROTEIN: 7.5 g/dL (ref 6.0–8.3)
Total Bilirubin: 0.6 mg/dL (ref 0.2–1.2)

## 2018-06-11 LAB — CBC WITH DIFFERENTIAL/PLATELET
Basophils Absolute: 0.1 10*3/uL (ref 0.0–0.1)
Basophils Relative: 1.1 % (ref 0.0–3.0)
EOS PCT: 3.1 % (ref 0.0–5.0)
Eosinophils Absolute: 0.1 10*3/uL (ref 0.0–0.7)
HCT: 42.6 % (ref 39.0–52.0)
Hemoglobin: 13.8 g/dL (ref 13.0–17.0)
LYMPHS ABS: 0.8 10*3/uL (ref 0.7–4.0)
Lymphocytes Relative: 18.1 % (ref 12.0–46.0)
MCHC: 32.4 g/dL (ref 30.0–36.0)
MCV: 82.2 fl (ref 78.0–100.0)
MONO ABS: 0.7 10*3/uL (ref 0.1–1.0)
Monocytes Relative: 16 % — ABNORMAL HIGH (ref 3.0–12.0)
NEUTROS ABS: 2.9 10*3/uL (ref 1.4–7.7)
Neutrophils Relative %: 61.7 % (ref 43.0–77.0)
PLATELETS: 134 10*3/uL — AB (ref 150.0–400.0)
RBC: 5.18 Mil/uL (ref 4.22–5.81)
RDW: 16.5 % — AB (ref 11.5–15.5)
WBC: 4.7 10*3/uL (ref 4.0–10.5)

## 2018-06-11 MED ORDER — PITAVASTATIN CALCIUM 1 MG PO TABS: 1.0000 mg | ORAL_TABLET | Freq: Every day | ORAL | 5 refills | Status: DC

## 2018-06-11 NOTE — Assessment & Plan Note (Signed)
Does experience leg cramping with statins-currently taking Crestor and still experiencing leg cramping Discussed increasing hydration-part of the cramping could be dehydration related Will discontinue Crestor and try pitavastatin 1 mg daily-we will increase if tolerated

## 2018-06-11 NOTE — Assessment & Plan Note (Signed)
Check A1c Diet controlled Continue regular walking Ideally work on weight loss Low sugar/carbohydrate diet

## 2018-06-11 NOTE — Assessment & Plan Note (Signed)
Blood pressure slightly elevated here today and tends to be borderline high Encouraged weight loss Continue regular walking Will continue to monitor blood pressure with current medications CBC, CMP

## 2018-08-05 ENCOUNTER — Other Ambulatory Visit: Payer: Self-pay

## 2018-08-05 NOTE — Patient Outreach (Signed)
[facility] Care Management  08/05/2018  [name] 01/22/1927 [id number]   [facility] patient he is taking Livalo 1 mg but, is having side effects doctor is trying to see if this medication will work for him he has try others like Lipitor,Crestor and has not work patient will not fill the medication until he feels it works for him.[name] is showing past due under [employer].    Lillia AbedAna Ollison-Moran CPhT Pharmacy Technician [facility] Care Management Direct Dial [phone]  Fax [phone] [email or web address]

## 2018-09-29 ENCOUNTER — Encounter: Payer: Self-pay | Admitting: Neurology

## 2018-10-27 ENCOUNTER — Other Ambulatory Visit: Payer: Self-pay | Admitting: Cardiology

## 2018-10-27 ENCOUNTER — Ambulatory Visit: Payer: Medicare Other | Admitting: Nurse Practitioner

## 2018-10-27 ENCOUNTER — Other Ambulatory Visit: Payer: Self-pay | Admitting: Neurology

## 2018-10-27 NOTE — Telephone Encounter (Signed)
Thank you for refilling ongoing Dr. Lawerance Bach. Have a good day

## 2018-10-29 ENCOUNTER — Ambulatory Visit: Payer: Medicare Other | Admitting: Neurology

## 2018-11-20 ENCOUNTER — Other Ambulatory Visit: Payer: Self-pay

## 2018-11-20 MED ORDER — AMLODIPINE BESYLATE 5 MG PO TABS: 5.0000 mg | ORAL_TABLET | Freq: Every day | ORAL | 1 refills | Status: DC

## 2018-11-20 MED ORDER — LOSARTAN POTASSIUM-HCTZ 50-12.5 MG PO TABS: 1.0000 | ORAL_TABLET | Freq: Every day | ORAL | 1 refills | Status: DC

## 2018-11-23 ENCOUNTER — Other Ambulatory Visit: Payer: Self-pay | Admitting: Interventional Cardiology

## 2018-11-24 ENCOUNTER — Telehealth: Payer: Self-pay | Admitting: Adult Health

## 2018-11-24 DIAGNOSIS — G4733 Obstructive sleep apnea (adult) (pediatric): Secondary | ICD-10-CM

## 2018-11-24 DIAGNOSIS — Z9989 Dependence on other enabling machines and devices: Principal | ICD-10-CM

## 2018-11-24 NOTE — Telephone Encounter (Addendum)
Call patient in regards to his CPAP compliance report and rescheduling upcoming appointment.  Compliance report from 10/25/2018 -11/23/2018 shows 29 out of 30 usage days with 29 days greater than 4 hours for 97% compliance.  Average usage 7 hours and 16 minutes with residual AHI 0.7.  Leaks in the 95th percentile 6.2.  Pressure in the 95th percentile 10 on minimum pressure 5 and max pressure 10 with a EPR level 3.   He reports doing well on current CPAP settings and has no questions at this time.  He is not currently in need of any DME equipment and continues to receive routinely from [facility].  He will follow-up in 1 year with Aundra Millet, NP or call earlier if needed.  Appointment will be scheduled on 11/24/2019 at 11 AM with arriving at 10:30 AM for check-in.  All questions answered and he was appreciative of phone call.

## 2018-11-27 ENCOUNTER — Encounter: Payer: Self-pay | Admitting: Interventional Cardiology

## 2018-11-27 ENCOUNTER — Ambulatory Visit: Payer: Medicare Other | Admitting: Adult Health

## 2018-12-02 ENCOUNTER — Telehealth: Payer: Self-pay | Admitting: Interventional Cardiology

## 2018-12-02 NOTE — Telephone Encounter (Signed)
Spoke with pt in regards to appt scheduled for 12/08/2018.  Pt denies any cardiac issues.  Rescheduled pt for 03/31/2019.  Advised to call if any issues prior to appt.

## 2018-12-08 ENCOUNTER — Ambulatory Visit: Payer: Medicare Other | Admitting: Interventional Cardiology

## 2018-12-15 ENCOUNTER — Ambulatory Visit: Payer: Medicare Other | Admitting: Internal Medicine

## 2018-12-29 ENCOUNTER — Other Ambulatory Visit: Payer: Self-pay | Admitting: Interventional Cardiology

## 2019-01-13 NOTE — Progress Notes (Deleted)
Virtual Visit via Video Note  I connected with Ronnie Boyd on 01/13/19 at 11:15 AM EDT by a video enabled telemedicine application and verified that I am speaking with the correct person using two identifiers.   I discussed the limitations of evaluation and management by telemedicine and the availability of in person appointments. The patient expressed understanding and agreed to proceed.  The patient is currently at home and I am in the office.    No referring provider.    History of Present Illness: He is here for follow up of his chronic medical conditions.    He is exercising regularly.    Diabetes: He is taking his medication daily as prescribed. He is compliant with a diabetic diet.   He checks his feet daily and denies foot lesions. He is up-to-date with an ophthalmology examination.   Hyperlipidemia: He is taking his medication daily. He is compliant with a low fat/cholesterol diet. He denies myalgias.   CAD, chronic edema, Hypertension: He is taking his medication daily. He is compliant with a low sodium diet.  He denies chest pain, palpitations, edema, shortness of breath and regular headaches.  He does not monitor his blood pressure at home.    GERD:  He is taking his medication daily as prescribed.  He denies any GERD symptoms and feels his GERD is well controlled.      Social History   Socioeconomic History  . Marital status: Married    Spouse name: Not on file  . Number of children: Not on file  . Years of education: Not on file  . Highest education level: Not on file  Occupational History  . Occupation: retired  Engineer, production  . Financial resource strain: Not on file  . Food insecurity:    Worry: Not on file    Inability: Not on file  . Transportation needs:    Medical: Not on file    Non-medical: Not on file  Tobacco Use  . Smoking status: Never Smoker  . Smokeless tobacco: Never Used  Substance and Sexual Activity  . Alcohol use: No  . Drug use: No   . Sexual activity: Not Currently  Lifestyle  . Physical activity:    Days per week: Not on file    Minutes per session: Not on file  . Stress: Not on file  Relationships  . Social connections:    Talks on phone: Not on file    Gets together: Not on file    Attends religious service: Not on file    Active member of club or organization: Not on file    Attends meetings of clubs or organizations: Not on file    Relationship status: Not on file  Other Topics Concern  . Not on file  Social History Narrative   Daughter is [name] Psychologist, sport and exercise at [employer])     Observations/Objective: Appears well in NAD   Assessment and Plan:  See Problem List for Assessment and Plan of chronic medical problems.   Follow Up Instructions:    I discussed the assessment and treatment plan with the patient. The patient was provided an opportunity to ask questions and all were answered. The patient agreed with the plan and demonstrated an understanding of the instructions.   The patient was advised to call back or seek an in-person evaluation if the symptoms worsen or if the condition fails to improve as anticipated.    Pincus Sanes, MD

## 2019-01-14 ENCOUNTER — Ambulatory Visit: Payer: Medicare Other | Admitting: Internal Medicine

## 2019-02-05 NOTE — Patient Instructions (Addendum)
  Tests ordered today. Your results will be released to MyChart (or called to you) after review, usually within 72hours after test completion. If any changes need to be made, you will be notified at that same time.   Medications reviewed and updated.  Changes include :      Please followup in 6 months   

## 2019-02-05 NOTE — Progress Notes (Signed)
Subjective:    Patient ID: Ronnie Boyd, male    DOB: Oct 28, 1926, 85 y.o.   MRN: [id number]  HPI The patient is here for follow up.  He is not exercising regularly.     Diabetes: He is taking his medication daily as prescribed. He is compliant with a diabetic diet.   He checks his feet daily and denies foot lesions. He is up-to-date with an ophthalmology examination.  He does not check his sugars at home.  Hyperlipidemia: He is taking his medication daily. He is compliant with a low fat/cholesterol diet. He denies myalgias.   CAD, chronic leg edema, Hypertension: He does follow with cardiology.  He is taking his medication daily. He is compliant with a low sodium diet.  He denies palpitations, edema and regular headaches.  He does experience chest pain with exertion.  He states he is not exercising regularly and the only activity he has been getting is getting the mail and taking the trash out.  He does not monitor his blood pressure at home regularly and has not checked it for a while.    GERD:  He is taking his medication daily as prescribed.  He denies any GERD symptoms and feels his GERD is well controlled.    Medications and allergies reviewed with patient and updated if appropriate.  Patient Active Problem List   Diagnosis Date Noted  . History of DVT of lower extremity 06/12/2017  . Myalgia 06/11/2017  . Bilateral leg edema 05/08/2017  . Urinary frequency 04/16/2016  . Coronary artery disease involving native coronary artery   . Diabetes (HCC) 11/09/2015  . OSA on CPAP 07/20/2015  . History of stroke, lacunar 2016 03/24/2015  . Aortic stenosis, moderate 2017 01/08/2011  . HYPERCHOLESTEROLEMIA 12/01/2007  . Essential hypertension 12/01/2007  . Peripheral vascular disease (HCC) 12/01/2007  . HEMORRHOIDS 12/01/2007  . Allergic rhinitis 12/01/2007  . GERD 12/01/2007  . DEGENERATIVE JOINT DISEASE 12/01/2007    Current Outpatient Medications on File Prior to Visit   Medication Sig Dispense Refill  . albuterol (PROVENTIL HFA;VENTOLIN HFA) 108 (90 Base) MCG/ACT inhaler Inhale 2 puffs into the lungs every 6 (six) hours as needed for wheezing or shortness of breath. 1 Inhaler 0  . amLODipine (NORVASC) 5 MG tablet Take 1 tablet (5 mg total) by mouth daily. 90 tablet 1  . aspirin 81 MG chewable tablet Chew by mouth daily.    . clopidogrel (PLAVIX) 75 MG tablet TAKE 1 TABLET BY MOUTH EVERY DAY 90 tablet 3  . Coenzyme Q10 200 MG capsule Take 200 mg by mouth daily.     . fluticasone (FLONASE) 50 MCG/ACT nasal spray Place 2 sprays into both nostrils daily. 16 g 0  . Garlic Oil 1000 MG CAPS Take 1 capsule by mouth daily.      . isosorbide mononitrate (IMDUR) 60 MG 24 hr tablet TAKE 1 TABLET BY MOUTH EVERY DAY 90 tablet 1  . loratadine (CLARITIN) 10 MG tablet Take 10 mg by mouth daily as needed for rhinitis.     Marland Kitchen. losartan-hydrochlorothiazide (HYZAAR) 50-12.5 MG tablet Take 1 tablet by mouth daily. 90 tablet 1  . metoprolol tartrate (LOPRESSOR) 25 MG tablet TAKE 1 TABLET BY MOUTH TWICE A DAY 180 tablet 0  . Multiple Vitamins-Minerals (CENTRUM SILVER) tablet Take 1 tablet by mouth daily.      . nitroGLYCERIN (NITROSTAT) 0.4 MG SL tablet PLACE 1 TABLET (0.4 MG TOTAL) UNDER THE TONGUE EVERY 5 (FIVE) MINUTES AS NEEDED FOR  CHEST PAIN. 25 tablet 3  . Omega-3 Fatty Acids (FISH OIL) 1000 MG CAPS Take 1 capsule by mouth daily.      . pantoprazole (PROTONIX) 40 MG tablet TAKE 1 TABLET (40 MG TOTAL) BY MOUTH DAILY. 30 tablet 11  . Pitavastatin Calcium 1 MG TABS Take 1 tablet (1 mg total) by mouth daily. 30 tablet 5  . ranolazine (RANEXA) 500 MG 12 hr tablet Take 1 tablet (500 mg total) by mouth 2 (two) times daily. Please schedule an appt for further refills. 1st attempt 180 tablet 3  . tamsulosin (FLOMAX) 0.4 MG CAPS capsule Take 0.4 mg by mouth daily.     No current facility-administered medications on file prior to visit.     Past Medical History:  Diagnosis Date  .  Allergic rhinitis   . Aortic stenosis    mild echo 7/14  . Borderline type 2 diabetes mellitus   . Coronary artery disease   . Degenerative joint disease   . Family history of adverse reaction to anesthesia    "daughter had problems when she was a teen; none since; it was related to asthma"  . GERD (gastroesophageal reflux disease)   . Hemorrhoids   . History of hiatal hernia    "dx'd years ago" (06/01/2015)  . Hypercholesteremia   . Hypertension   . OSA on CPAP since 05/23/2015  . Prostatitis    hx of  . PVD (peripheral vascular disease) (HCC)   . Stroke [facility])     Past Surgical History:  Procedure Laterality Date  . CARDIAC CATHETERIZATION [name] 03/18/2015   Procedure: Left Heart Cath and Coronary Angiography;  Surgeon: Laurey Morale, MD;  Location: [facility] INVASIVE CV LAB;  Service: Cardiovascular;  Laterality: [name];  . CARDIAC CATHETERIZATION [name] 06/01/2015   Procedure: Coronary Stent Intervention;  Surgeon: Iran Ouch, MD;  Location: [facility] INVASIVE CV LAB;  Service: Cardiovascular;  Laterality: [name];  . CARDIAC CATHETERIZATION  11/11/2015  . CARDIAC CATHETERIZATION [name] 11/11/2015   Procedure: Intravascular Pressure Wire/FFR Study;  Surgeon: Tonny Bollman, MD;  Location: [facility] INVASIVE CV LAB;  Service: Cardiovascular;  Laterality: [name];  . CARPAL TUNNEL RELEASE Bilateral 2006-2007  . CATARACT EXTRACTION W/ INTRAOCULAR LENS  IMPLANT, BILATERAL Bilateral ~ 2003  . EXCISIONAL HEMORRHOIDECTOMY  1989    Social History   Socioeconomic History  . Marital status: Married    Spouse name: Not on file  . Number of children: Not on file  . Years of education: Not on file  . Highest education level: Not on file  Occupational History  . Occupation: retired  Engineer, production  . Financial resource strain: Not on file  . Food insecurity:    Worry: Not on file    Inability: Not on file  . Transportation needs:    Medical: Not on file    Non-medical: Not on file  Tobacco Use  . Smoking status:  Never Smoker  . Smokeless tobacco: Never Used  Substance and Sexual Activity  . Alcohol use: No  . Drug use: No  . Sexual activity: Not Currently  Lifestyle  . Physical activity:    Days per week: Not on file    Minutes per session: Not on file  . Stress: Not on file  Relationships  . Social connections:    Talks on phone: Not on file    Gets together: Not on file    Attends religious service: Not on file    Active member of club or organization: Not  on file    Attends meetings of clubs or organizations: Not on file    Relationship status: Not on file  Other Topics Concern  . Not on file  Social History Narrative   Daughter is [name] Psychologist, sport and exercise at [employer])    Family History  Problem Relation Age of Onset  . Hypertension Mother   . Diabetes Mother 79  . Stroke Father 50  . Arthritis Other   . Diabetes Brother     Review of Systems  Constitutional: Negative for chills and fever.  Respiratory: Positive for shortness of breath (with moderate exertion). Negative for cough and wheezing.   Cardiovascular: Positive for chest pain (with exertion). Negative for palpitations and leg swelling.  Neurological: Negative for light-headedness and headaches.       Objective:   Vitals:   02/06/19 1320  BP: (!) 144/70  Pulse: 64  Resp: 16  Temp: 98.4 F (36.9 C)  SpO2: 97%   BP Readings from Last 3 Encounters:  02/06/19 (!) 144/70  06/11/18 (!) 144/80  12/10/17 (!) 142/72   Wt Readings from Last 3 Encounters:  02/06/19 178 lb 12.8 oz (81.1 kg)  06/11/18 179 lb 12.8 oz (81.6 kg)  12/10/17 182 lb (82.6 kg)   Body mass index is 30.69 kg/m.   Physical Exam    Constitutional: Appears well-developed and well-nourished. No distress.  HENT:  Head: Normocephalic and atraumatic.  Neck: Neck supple. No tracheal deviation present. No thyromegaly present.  No cervical lymphadenopathy Cardiovascular: Normal rate, regular rhythm and normal heart sounds.    2/6 systolic murmur heard.  Right carotid bruit .  No edema Pulmonary/Chest: Effort normal and breath sounds normal. No respiratory distress. No has no wheezes. No rales.  Skin: Skin is warm and dry. Not diaphoretic.  Psychiatric: Normal mood and affect. Behavior is normal.      Assessment & Plan:    See Problem List for Assessment and Plan of chronic medical problems.

## 2019-02-06 ENCOUNTER — Encounter: Payer: Self-pay | Admitting: Internal Medicine

## 2019-02-06 ENCOUNTER — Other Ambulatory Visit (INDEPENDENT_AMBULATORY_CARE_PROVIDER_SITE_OTHER): Payer: Medicare Other

## 2019-02-06 ENCOUNTER — Ambulatory Visit (INDEPENDENT_AMBULATORY_CARE_PROVIDER_SITE_OTHER): Payer: Medicare Other | Admitting: Internal Medicine

## 2019-02-06 ENCOUNTER — Other Ambulatory Visit: Payer: Self-pay

## 2019-02-06 VITALS — BP 144/70 | HR 64 | Temp 98.4°F | Resp 16 | Ht 64.0 in | Wt 178.8 lb

## 2019-02-06 DIAGNOSIS — I2510 Atherosclerotic heart disease of native coronary artery without angina pectoris: Secondary | ICD-10-CM

## 2019-02-06 DIAGNOSIS — K219 Gastro-esophageal reflux disease without esophagitis: Secondary | ICD-10-CM | POA: Diagnosis not present

## 2019-02-06 DIAGNOSIS — E7800 Pure hypercholesterolemia, unspecified: Secondary | ICD-10-CM

## 2019-02-06 DIAGNOSIS — I10 Essential (primary) hypertension: Secondary | ICD-10-CM | POA: Diagnosis not present

## 2019-02-06 DIAGNOSIS — N183 Chronic kidney disease, stage 3 (moderate): Secondary | ICD-10-CM

## 2019-02-06 DIAGNOSIS — E0822 Diabetes mellitus due to underlying condition with diabetic chronic kidney disease: Secondary | ICD-10-CM | POA: Diagnosis not present

## 2019-02-06 DIAGNOSIS — R600 Localized edema: Secondary | ICD-10-CM

## 2019-02-06 LAB — COMPREHENSIVE METABOLIC PANEL
ALT: 12 U/L (ref 0–53)
AST: 16 U/L (ref 0–37)
Albumin: 4.1 g/dL (ref 3.5–5.2)
Alkaline Phosphatase: 41 U/L (ref 39–117)
BUN: 21 mg/dL (ref 6–23)
CO2: 27 mEq/L (ref 19–32)
Calcium: 9.5 mg/dL (ref 8.4–10.5)
Chloride: 103 mEq/L (ref 96–112)
Creatinine, Ser: 1.35 mg/dL (ref 0.40–1.50)
GFR: 60.14 mL/min (ref >60.00)
Glucose, Bld: 89 mg/dL (ref 70–99)
Potassium: 4 mEq/L (ref 3.5–5.1)
Sodium: 138 mEq/L (ref 135–145)
Total Bilirubin: 0.6 mg/dL (ref 0.2–1.2)
Total Protein: 7.5 g/dL (ref 6.0–8.3)

## 2019-02-06 LAB — CBC WITH DIFFERENTIAL/PLATELET
Basophils Absolute: 0 10*3/uL (ref 0.0–0.1)
Basophils Relative: 1 % (ref 0.0–3.0)
Eosinophils Absolute: 0.2 10*3/uL (ref 0.0–0.7)
Eosinophils Relative: 3.4 % (ref 0.0–5.0)
HCT: 41.8 % (ref 39.0–52.0)
Hemoglobin: 13.9 g/dL (ref 13.0–17.0)
Lymphocytes Relative: 19.5 % (ref 12.0–46.0)
Lymphs Abs: 1 10*3/uL (ref 0.7–4.0)
MCHC: 33.3 g/dL (ref 30.0–36.0)
MCV: 81.9 fl (ref 78.0–100.0)
Monocytes Absolute: 0.8 10*3/uL (ref 0.1–1.0)
Monocytes Relative: 16 % — ABNORMAL HIGH (ref 3.0–12.0)
Neutro Abs: 3 10*3/uL (ref 1.4–7.7)
Neutrophils Relative %: 60.1 % (ref 43.0–77.0)
Platelets: 158 10*3/uL (ref 150.0–400.0)
RBC: 5.1 Mil/uL (ref 4.22–5.81)
RDW: 16 % — ABNORMAL HIGH (ref 11.5–15.5)
WBC: 5 10*3/uL (ref 4.0–10.5)

## 2019-02-06 LAB — HEMOGLOBIN A1C: Hgb A1c MFr Bld: 6.6 % — ABNORMAL HIGH (ref 4.6–6.5)

## 2019-02-06 NOTE — Assessment & Plan Note (Signed)
Lab Results  Component Value Date   HGBA1C 6.3 06/11/2018   Sugars have been well controlled-he does not monitor them at home Diet controlled He has been compliant with a diabetic diet, but is not exercising regularly Encouraged him to be as active as possible We will check A1c

## 2019-02-06 NOTE — Assessment & Plan Note (Signed)
Lipids have been well controlled and he is not fasting so we will hold off on checking a lipid panel Continue statin

## 2019-02-06 NOTE — Assessment & Plan Note (Signed)
GERD controlled Continue daily medication  

## 2019-02-06 NOTE — Assessment & Plan Note (Addendum)
Has appointment with cardiology in July He states he has been experiencing chest pain when he exerts himself He is not bothered by this He is not exercising regularly and the only activity he is getting is taking the trash out and getting the mail Discussed that this could be from blockages in his heart arteries and then he needs to contact cardiology He states he does not want any interventions if needed Taking Ranexa, aspirin 81 mg daily, statin, metoprolol CMP, CBC

## 2019-02-06 NOTE — Assessment & Plan Note (Signed)
BP controlled Current regimen effective and well tolerated Continue current medications at current doses CMP 

## 2019-02-06 NOTE — Assessment & Plan Note (Signed)
Denies any edema Taking hydrochlorothiazide 12.5 mg daily Compliant with a low-sodium diet

## 2019-02-07 ENCOUNTER — Other Ambulatory Visit: Payer: Self-pay | Admitting: Cardiology

## 2019-03-11 ENCOUNTER — Encounter: Payer: Self-pay | Admitting: Internal Medicine

## 2019-03-30 ENCOUNTER — Telehealth: Payer: Self-pay | Admitting: Interventional Cardiology

## 2019-03-30 NOTE — Progress Notes (Signed)
Cardiology Office Note:    Date:  03/31/2019   ID:  Ronnie BilletWilliam G Egge, DOB 04-Aug-1927, MRN [id number]  PCP:  Pincus SanesBurns, Stacy J, MD  Cardiologist:  No primary care provider on file.   Referring MD: Pincus SanesBurns, Stacy J, MD   Chief Complaint  Patient presents with  . Coronary Artery Disease  . Cardiac Valve Problem    History of Present Illness:    Ronnie Boyd is a 85 y.o. male with a hx of CAD, with stents in the mid LAD and distal LAD, as well as a ramus intermedius stent. He has diffuse distal vessel disease in other territories. Other problems include moderate aortic stenosis, hypertension, diabetes, peripheral arterial disease, and obstructive sleep apnea.   Complains of progression of dyspnea preventing desired activity.  No orthopnea or PND.  No lower extremity swelling or chest discomfort.  No palpitations or transient neurological symptoms.  He has not had syncope.  Past Medical History:  Diagnosis Date  . Allergic rhinitis   . Aortic stenosis    mild echo 7/14  . Borderline type 2 diabetes mellitus   . Coronary artery disease   . Degenerative joint disease   . Family history of adverse reaction to anesthesia    "daughter had problems when she was a teen; none since; it was related to asthma"  . GERD (gastroesophageal reflux disease)   . Hemorrhoids   . History of hiatal hernia    "dx'd years ago" (06/01/2015)  . Hypercholesteremia   . Hypertension   . OSA on CPAP since 05/23/2015  . Prostatitis    hx of  . PVD (peripheral vascular disease) (HCC)   . Stroke [facility](HCC)     Past Surgical History:  Procedure Laterality Date  . CARDIAC CATHETERIZATION [name] 03/18/2015   Procedure: Left Heart Cath and Coronary Angiography;  Surgeon: Laurey Moralealton S McLean, MD;  [facility];  Service: Cardiovascular;  Laterality: [name];  . CARDIAC CATHETERIZATION [name] 06/01/2015   Procedure: Coronary Stent Intervention;  Surgeon: Iran OuchMuhammad A Arida, MD;  Location: [facility] INVASIVE CV LAB;  Service:  Cardiovascular;  Laterality: [name];  . CARDIAC CATHETERIZATION  11/11/2015  . CARDIAC CATHETERIZATION [name] 11/11/2015   Procedure: Intravascular Pressure Wire/FFR Study;  Surgeon: Tonny BollmanMichael Cooper, MD;  [facility];  Service: Cardiovascular;  Laterality: [name];  . CARPAL TUNNEL RELEASE Bilateral 2006-2007  . CATARACT EXTRACTION W/ INTRAOCULAR LENS  IMPLANT, BILATERAL Bilateral ~ 2003  . EXCISIONAL HEMORRHOIDECTOMY  1989    Current Medications: Current Meds  Medication Sig  . albuterol (PROVENTIL HFA;VENTOLIN HFA) 108 (90 Base) MCG/ACT inhaler Inhale 2 puffs into the lungs every 6 (six) hours as needed for wheezing or shortness of breath.  Marland Kitchen. amLODipine (NORVASC) 5 MG tablet Take 1 tablet (5 mg total) by mouth daily.  Marland Kitchen. aspirin 81 MG chewable tablet Chew by mouth daily.  . clopidogrel (PLAVIX) 75 MG tablet TAKE 1 TABLET BY MOUTH EVERY DAY  . Coenzyme Q10 200 MG capsule Take 200 mg by mouth daily.   . fluticasone (FLONASE) 50 MCG/ACT nasal spray Place 2 sprays into both nostrils daily.  . Garlic Oil 1000 MG CAPS Take 1 capsule by mouth daily.    . isosorbide mononitrate (IMDUR) 60 MG 24 hr tablet TAKE 1 TABLET BY MOUTH EVERY DAY  . loratadine (CLARITIN) 10 MG tablet Take 10 mg by mouth daily as needed for rhinitis.   Marland Kitchen. losartan-hydrochlorothiazide (HYZAAR) 50-12.5 MG tablet Take 1 tablet by mouth daily.  .Marland Kitchen  metoprolol tartrate (LOPRESSOR) 25 MG tablet TAKE 1 TABLET BY MOUTH TWICE A DAY  . Multiple Vitamins-Minerals (CENTRUM SILVER) tablet Take 1 tablet by mouth daily.    . nitroGLYCERIN (NITROSTAT) 0.4 MG SL tablet PLACE 1 TABLET (0.4 MG TOTAL) UNDER THE TONGUE EVERY 5 (FIVE) MINUTES AS NEEDED FOR CHEST PAIN.  Marland Kitchen Omega-3 Fatty Acids (FISH OIL) 1000 MG CAPS Take 1 capsule by mouth daily.    . pantoprazole (PROTONIX) 40 MG tablet TAKE 1 TABLET (40 MG TOTAL) BY MOUTH DAILY.  Marland Kitchen Pitavastatin Calcium 1 MG TABS Take 1 tablet (1 mg total) by mouth daily.  . ranolazine (RANEXA) 500 MG 12 hr  tablet Take 1 tablet (500 mg total) by mouth 2 (two) times daily. Please schedule an appt for further refills. 1st attempt  . tamsulosin (FLOMAX) 0.4 MG CAPS capsule Take 0.4 mg by mouth daily.     Allergies:   Crestor [rosuvastatin calcium], Lipitor [atorvastatin], Other, and Pravastatin   Social History   Socioeconomic History  . Marital status: Married    Spouse name: Not on file  . Number of children: Not on file  . Years of education: Not on file  . Highest education level: Not on file  Occupational History  . Occupation: retired  Scientific laboratory technician  . Financial resource strain: Not on file  . Food insecurity    Worry: Not on file    Inability: Not on file  . Transportation needs    Medical: Not on file    Non-medical: Not on file  Tobacco Use  . Smoking status: Never Smoker  . Smokeless tobacco: Never Used  Substance and Sexual Activity  . Alcohol use: No  . Drug use: No  . Sexual activity: Not Currently  Lifestyle  . Physical activity    Days per week: Not on file    Minutes per session: Not on file  . Stress: Not on file  Relationships  . Social Herbalist on phone: Not on file    Gets together: Not on file    Attends religious service: Not on file    Active member of club or organization: Not on file    Attends meetings of clubs or organizations: Not on file    Relationship status: Not on file  Other Topics Concern  . Not on file  Social History Narrative   Daughter is Mrs. Juliane Lack Runner, broadcasting/film/video at [employer])     Family History: The patient's family history includes Arthritis in an other family member; Diabetes in his brother; Diabetes (age of onset: 57) in his mother; Hypertension in his mother; Stroke (age of onset: 59) in his father.  ROS:   Please see the history of present illness.    He has occasional tightness with dyspnea if he walks too briskly.  All other systems reviewed and are negative.  EKGs/Labs/Other Studies Reviewed:     The following studies were reviewed today: 2D Doppler echocardiogram February 2019: Study Conclusions  - Left ventricle: The cavity size was normal. Wall thickness was   increased in a pattern of moderate LVH. Systolic function was   vigorous. The estimated ejection fraction was in the range of 65%   to 70%. Doppler parameters are consistent with abnormal left   ventricular relaxation (grade 1 diastolic dysfunction). Doppler   parameters are consistent with high ventricular filling pressure. - Aortic valve: AV is thickened, calcified Difficult to see well   Peak and mean gradients across the aortic  valve are 55 and 30 mm   Hg respectively consistent with moderate AS. No significant   change from echo from 2018 There was mild regurgitation. Valve   area (VTI): 0.93 cm^2. Valve area (Vmax): 0.83 cm^2. Valve area   (Vmean): 0.87 cm^2. - Left atrium: The atrium was mildly dilated.  EKG:  EKG performed on 03/31/2019, reveals sinus rhythm, LVH, left axis deviation, nonspecific ST T abnormality.  I disagree with the computer interpretation of atrial flutter.  Compared to the prior tracing from 2019, current heart rate is slightly faster.  Recent Labs: 02/06/2019: ALT 12; BUN 21; Creatinine, Ser 1.35; Hemoglobin 13.9; Platelets 158.0; Potassium 4.0; Sodium 138  Recent Lipid Panel    Component Value Date/Time   CHOL 157 06/11/2018 1124   CHOL 164 04/04/2015 1059   TRIG 53.0 06/11/2018 1124   TRIG 42 04/04/2015 1059   HDL 74.70 06/11/2018 1124   HDL 82 04/04/2015 1059   CHOLHDL 2 06/11/2018 1124   VLDL 10.6 06/11/2018 1124   LDLCALC 71 06/11/2018 1124   LDLCALC 74 04/04/2015 1059   LDLDIRECT 131.5 11/06/2011 1057    Physical Exam:    VS:  BP (!) 148/72   Pulse 80   Ht 5\' 4"  (1.626 m)   Wt 182 lb (82.6 kg)   SpO2 96%   BMI 31.24 kg/m     Wt Readings from Last 3 Encounters:  03/31/19 182 lb (82.6 kg)  02/06/19 178 lb 12.8 oz (81.1 kg)  06/11/18 179 lb 12.8 oz (81.6 kg)      GEN: Moderate obesity. No acute distress HEENT: Normal NECK: No JVD. LYMPHATICS: No lymphadenopathy CARDIAC:  RRR with 3/6 crescendo decrescendo aortic stenosis murmur, but no gallop, or edema. VASCULAR:  Normal Pulses. No bruits. RESPIRATORY:  Clear to auscultation without rales, wheezing or rhonchi  ABDOMEN: Soft, non-tender, non-distended, No pulsatile mass, MUSCULOSKELETAL: No deformity  SKIN: Warm and dry NEUROLOGIC:  Alert and oriented x 3 PSYCHIATRIC:  Normal affect   ASSESSMENT:    1. Coronary artery disease involving native coronary artery of native heart without angina pectoris   2. Nonrheumatic aortic valve stenosis   3. Essential hypertension   4. Diabetes mellitus due to underlying condition with stage 3 chronic kidney disease, unspecified whether long term insulin use (HCC)   5. Peripheral vascular disease (HCC)   6. OSA on CPAP   7. Educated About Covid-19 Virus Infection    PLAN:    In order of problems listed above:  1. I would have to classify him as having stable angina as he gets some chest tightness and dyspnea with moderate activity.  He feels this is been about the same over the last 4 to 5 years. 2. Increasing limitation due to dyspnea.  He has stopped doing his yard.  Denies orthopnea, PND, edema, and syncope.  2D Doppler echocardiogram to follow-up on moderate aortic stenosis from 17 months ago when transvalvular velocity was 3.76 m/s. 3. Mildly elevated blood pressure that on repeat is 140/80.  This is adequate for his age of nearly [age]. 4. Target A1c less than 7.  Most recent was 6.6 in May. 5. No complaints.  Secondary prevention discussed. 6. Stressed compliance with CPAP 7. Distancing, masking, and washing stressed  Overall education and awareness concerning primary/secondary risk prevention was discussed in detail: LDL less than 70, hemoglobin A1c less than 7, blood pressure target less than 130/80 mmHg, >150 minutes of moderate aerobic activity per  week, avoidance of smoking, weight  control (via diet and exercise), and continued surveillance/management of/for obstructive sleep apnea.  Brief discussion of TAVR.   Medication Adjustments/Labs and Tests Ordered: Current medicines are reviewed at length with the patient today.  Concerns regarding medicines are outlined above.  Orders Placed This Encounter  Procedures  . EKG 12-Lead  . ECHOCARDIOGRAM COMPLETE   No orders of the defined types were placed in this encounter.   Patient Instructions  Medication Instructions:  Your physician recommends that you continue on your current medications as directed. Please refer to the Current Medication list given to you today.  If you need a refill on your cardiac medications before your next appointment, please call your pharmacy.   Lab work: None If you have labs (blood work) drawn today and your tests are completely normal, you will receive your results only by: Marland Kitchen. MyChart Message (if you have MyChart) OR . A paper copy in the mail If you have any lab test that is abnormal or we need to change your treatment, we will call you to review the results.  Testing/Procedures: Your physician has requested that you have an echocardiogram. Echocardiography is a painless test that uses sound waves to create images of your heart. It provides your doctor with information about the size and shape of your heart and how well your heart's chambers and valves are working. This procedure takes approximately one hour. There are no restrictions for this procedure.   Follow-Up: At [facility], you and your health needs are our priority.  As part of our continuing mission to provide you with exceptional heart care, we have created designated Provider Care Teams.  These Care Teams include your primary Cardiologist (physician) and Advanced Practice Providers (APPs -  Physician Assistants and Nurse Practitioners) who all work together to provide you with the care  you need, when you need it. You will need a follow up appointment in 9 months.  Please call our office 2 months in advance to schedule this appointment.  You may see Dr. Katrinka BlazingSmith or one of the following Advanced Practice Providers on your designated Care Team:   Norma FredricksonLori Gerhardt, NP Nada BoozerLaura Ingold, NP . Georgie ChardJill McDaniel, NP  Any Other Special Instructions Will Be Listed Below (If Applicable).       Signed, Lesleigh NoeHenry W Scarlettrose Costilow III, MD  03/31/2019 11:25 AM    [facility]

## 2019-03-30 NOTE — Telephone Encounter (Signed)

## 2019-03-30 NOTE — Telephone Encounter (Signed)
New Message ° ° ° °Left message to confirm appt and answer covid questions  °

## 2019-03-31 ENCOUNTER — Ambulatory Visit (INDEPENDENT_AMBULATORY_CARE_PROVIDER_SITE_OTHER): Payer: Medicare Other | Admitting: Interventional Cardiology

## 2019-03-31 ENCOUNTER — Other Ambulatory Visit: Payer: Self-pay

## 2019-03-31 ENCOUNTER — Encounter: Payer: Self-pay | Admitting: Interventional Cardiology

## 2019-03-31 VITALS — BP 148/72 | HR 80 | Ht 64.0 in | Wt 182.0 lb

## 2019-03-31 DIAGNOSIS — G4733 Obstructive sleep apnea (adult) (pediatric): Secondary | ICD-10-CM

## 2019-03-31 DIAGNOSIS — E0822 Diabetes mellitus due to underlying condition with diabetic chronic kidney disease: Secondary | ICD-10-CM

## 2019-03-31 DIAGNOSIS — Z9989 Dependence on other enabling machines and devices: Secondary | ICD-10-CM

## 2019-03-31 DIAGNOSIS — I350 Nonrheumatic aortic (valve) stenosis: Secondary | ICD-10-CM

## 2019-03-31 DIAGNOSIS — I2510 Atherosclerotic heart disease of native coronary artery without angina pectoris: Secondary | ICD-10-CM | POA: Diagnosis not present

## 2019-03-31 DIAGNOSIS — N183 Chronic kidney disease, stage 3 (moderate): Secondary | ICD-10-CM

## 2019-03-31 DIAGNOSIS — I739 Peripheral vascular disease, unspecified: Secondary | ICD-10-CM

## 2019-03-31 DIAGNOSIS — I10 Essential (primary) hypertension: Secondary | ICD-10-CM

## 2019-03-31 DIAGNOSIS — Z7189 Other specified counseling: Secondary | ICD-10-CM

## 2019-03-31 NOTE — Patient Instructions (Signed)
Medication Instructions:  Your physician recommends that you continue on your current medications as directed. Please refer to the Current Medication list given to you today.  If you need a refill on your cardiac medications before your next appointment, please call your pharmacy.   Lab work: None If you have labs (blood work) drawn today and your tests are completely normal, you will receive your results only by: Marland Kitchen MyChart Message (if you have MyChart) OR . A paper copy in the mail If you have any lab test that is abnormal or we need to change your treatment, we will call you to review the results.  Testing/Procedures: Your physician has requested that you have an echocardiogram. Echocardiography is a painless test that uses sound waves to create images of your heart. It provides your doctor with information about the size and shape of your heart and how well your heart's chambers and valves are working. This procedure takes approximately one hour. There are no restrictions for this procedure.   Follow-Up: At [facility], you and your health needs are our priority.  As part of our continuing mission to provide you with exceptional heart care, we have created designated Provider Care Teams.  These Care Teams include your primary Cardiologist (physician) and Advanced Practice Providers (APPs -  Physician Assistants and Nurse Practitioners) who all work together to provide you with the care you need, when you need it. You will need a follow up appointment in 9 months.  Please call our office 2 months in advance to schedule this appointment.  You may see Dr. Tamala Julian or one of the following Advanced Practice Providers on your designated Care Team:   Truitt Merle, NP Cecilie Kicks, NP . Kathyrn Drown, NP  Any Other Special Instructions Will Be Listed Below (If Applicable).

## 2019-04-08 ENCOUNTER — Other Ambulatory Visit: Payer: Self-pay

## 2019-04-08 ENCOUNTER — Ambulatory Visit (HOSPITAL_COMMUNITY): Payer: Medicare Other | Attending: Internal Medicine

## 2019-04-08 DIAGNOSIS — I350 Nonrheumatic aortic (valve) stenosis: Secondary | ICD-10-CM | POA: Diagnosis not present

## 2019-04-22 ENCOUNTER — Other Ambulatory Visit: Payer: Self-pay | Admitting: Internal Medicine

## 2019-04-22 NOTE — Progress Notes (Signed)
Cardiology Office Note:    Date:  04/24/2019   ID:  Ronnie Boyd, DOB 08-22-1927, MRN [id number]  PCP:  Pincus Sanes, MD  Cardiologist:  No primary care provider on file.   Referring MD: Pincus Sanes, MD   Chief Complaint  Patient presents with  . Coronary Artery Disease  . Cardiac Valve Problem    Severe AS    History of Present Illness:    Ronnie Boyd is a 85 y.o. male with a hx of severe aortic stenosis documented by recent echo, CAD with stents in the mid LAD, distal LAD, and ramus intermedius, hypertension, diabetes II, peripheral arterial disease, and obstructive sleep apnea.  On the last office visit the patient commented that he is having increasing difficulty with dyspnea on exertion.  He cannot walk 500 yards without having to stop because of dyspnea and chest tightness.  Although he can wheel his trash cans to the curb he cannot do it without developing mild dyspnea and chest tightness.  This is relatively new over the last 6 months.  He is finding that he is becoming increasingly sedentary because of these limitations.  He has not needed to use nitroglycerin.  The last office visit led to an echocardiogram which demonstrated severe aortic stenosis with a peak valve velocity of 4.16 m/s, peak gradient 69 mmHg and mean gradient 38 mmHg.  He is in today to discuss treatment options.  Past Medical History:  Diagnosis Date  . Allergic rhinitis   . Aortic stenosis    mild echo 7/14  . Borderline type 2 diabetes mellitus   . Coronary artery disease   . Degenerative joint disease   . Family history of adverse reaction to anesthesia    "daughter had problems when she was a teen; none since; it was related to asthma"  . GERD (gastroesophageal reflux disease)   . Hemorrhoids   . History of hiatal hernia    "dx'd years ago" (06/01/2015)  . Hypercholesteremia   . Hypertension   . OSA on CPAP since 05/23/2015  . Prostatitis    hx of  . PVD (peripheral vascular  disease) (HCC)   . Stroke [facility])     Past Surgical History:  Procedure Laterality Date  . CARDIAC CATHETERIZATION [name] 03/18/2015   Procedure: Left Heart Cath and Coronary Angiography;  Surgeon: Laurey Morale, MD;  Location: [facility] INVASIVE CV LAB;  Service: Cardiovascular;  Laterality: [name];  . CARDIAC CATHETERIZATION [name] 06/01/2015   Procedure: Coronary Stent Intervention;  Surgeon: Iran Ouch, MD;  Location: [facility] INVASIVE CV LAB;  Service: Cardiovascular;  Laterality: [name];  . CARDIAC CATHETERIZATION  11/11/2015  . CARDIAC CATHETERIZATION [name] 11/11/2015   Procedure: Intravascular Pressure Wire/FFR Study;  Surgeon: Tonny Bollman, MD;  Location: [facility] INVASIVE CV LAB;  Service: Cardiovascular;  Laterality: [name];  . CARPAL TUNNEL RELEASE Bilateral 2006-2007  . CATARACT EXTRACTION W/ INTRAOCULAR LENS  IMPLANT, BILATERAL Bilateral ~ 2003  . EXCISIONAL HEMORRHOIDECTOMY  1989    Current Medications: Current Meds  Medication Sig  . albuterol (PROVENTIL HFA;VENTOLIN HFA) 108 (90 Base) MCG/ACT inhaler Inhale 2 puffs into the lungs every 6 (six) hours as needed for wheezing or shortness of breath.  Marland Kitchen amLODipine (NORVASC) 5 MG tablet Take 1 tablet (5 mg total) by mouth daily.  Marland Kitchen aspirin 81 MG chewable tablet Chew by mouth daily.  . clopidogrel (PLAVIX) 75 MG tablet TAKE 1 TABLET BY MOUTH EVERY DAY  . Coenzyme Q10 200 MG capsule Take  200 mg by mouth daily.   . fluticasone (FLONASE) 50 MCG/ACT nasal spray Place 2 sprays into both nostrils daily.  . Garlic Oil 7564 MG CAPS Take 1 capsule by mouth daily.    . isosorbide mononitrate (IMDUR) 60 MG 24 hr tablet TAKE 1 TABLET BY MOUTH EVERY DAY  . loratadine (CLARITIN) 10 MG tablet Take 10 mg by mouth daily as needed for rhinitis.   Marland Kitchen losartan-hydrochlorothiazide (HYZAAR) 50-12.5 MG tablet Take 1 tablet by mouth daily.  . metoprolol tartrate (LOPRESSOR) 25 MG tablet TAKE 1 TABLET BY MOUTH TWICE A DAY  . Multiple Vitamins-Minerals (CENTRUM SILVER) tablet Take 1  tablet by mouth daily.    . nitroGLYCERIN (NITROSTAT) 0.4 MG SL tablet PLACE 1 TABLET (0.4 MG TOTAL) UNDER THE TONGUE EVERY 5 (FIVE) MINUTES AS NEEDED FOR CHEST PAIN.  Marland Kitchen Omega-3 Fatty Acids (FISH OIL) 1000 MG CAPS Take 1 capsule by mouth daily.    . pantoprazole (PROTONIX) 40 MG tablet TAKE 1 TABLET (40 MG TOTAL) BY MOUTH DAILY.  Marland Kitchen Pitavastatin Calcium 1 MG TABS Take 1 tablet (1 mg total) by mouth daily.  . ranolazine (RANEXA) 500 MG 12 hr tablet Take 1 tablet (500 mg total) by mouth 2 (two) times daily. Please schedule an appt for further refills. 1st attempt  . tamsulosin (FLOMAX) 0.4 MG CAPS capsule Take 0.4 mg by mouth daily.     Allergies:   Crestor [rosuvastatin calcium], Lipitor [atorvastatin], Other, and Pravastatin   Social History   Socioeconomic History  . Marital status: Married    Spouse name: Not on file  . Number of children: Not on file  . Years of education: Not on file  . Highest education level: Not on file  Occupational History  . Occupation: retired  Scientific laboratory technician  . Financial resource strain: Not on file  . Food insecurity    Worry: Not on file    Inability: Not on file  . Transportation needs    Medical: Not on file    Non-medical: Not on file  Tobacco Use  . Smoking status: Never Smoker  . Smokeless tobacco: Never Used  Substance and Sexual Activity  . Alcohol use: No  . Drug use: No  . Sexual activity: Not Currently  Lifestyle  . Physical activity    Days per week: Not on file    Minutes per session: Not on file  . Stress: Not on file  Relationships  . Social Herbalist on phone: Not on file    Gets together: Not on file    Attends religious service: Not on file    Active member of club or organization: Not on file    Attends meetings of clubs or organizations: Not on file    Relationship status: Not on file  Other Topics Concern  . Not on file  Social History Narrative   Daughter is Mrs. Juliane Lack Runner, broadcasting/film/video at [employer])     Family History: The patient's family history includes Arthritis in an other family member; Diabetes in his brother; Diabetes (age of onset: 63) in his mother; Hypertension in his mother; Stroke (age of onset: 24) in his father.  ROS:   Please see the history of present illness.    He denies orthopnea and PND.  There is no lower extremity edema.  He has not had syncope.  He denies palpitations.  No transient neurological symptoms.  He remains physically active.  Last coronary angiogram was performed in 2017.  All other systems reviewed and are negative.  EKGs/Labs/Other Studies Reviewed:    The following studies were reviewed today:  2 D Doppler Echocardiogram 03/2019: IMPRESSIONS    1. The left ventricle has hyperdynamic systolic function, with an ejection fraction of >65%. The cavity size was normal. There is moderately increased left ventricular wall thickness. Left ventricular diastolic Doppler parameters are consistent with  impaired relaxation. Elevated left atrial and left ventricular end-diastolic pressures The E/e' is >20. No evidence of left ventricular regional wall motion abnormalities.  2. The right ventricle has normal systolic function. The cavity was normal. There is no increase in right ventricular wall thickness.  3. Left atrial size was mildly dilated.  4. The mitral valve is abnormal. Mild thickening of the mitral valve leaflet. There is mild mitral annular calcification present.  5. The tricuspid valve is grossly normal.  6. The aortic valve has an indeterminate number of cusps. Severe calcifcation of the aortic valve. Aortic valve regurgitation is mild by color flow Doppler. Severe stenosis of the aortic valve.  Aortic valve peak velocity 4.16 m/s and peak gradient 69 mmHg with mean of 38 mmHg  7. The aorta is normal in size and structure.  Catheterization 2017:  Diagnostic Dominance: Left  CONCLUSIONS: 1. Continued patency of the stented  segments in the mid- and distal-LAD 2. Continued patency of the stented segment in the ramus intermedius 3. Severe stenosis of a small nondominant RCA 4. Moderate proximal LAD stenosis with negative pressure wire analysis 5. Mild LCx stenosis 6. Severe diffuse small vessel CAD appropriate for medical therapy  October 03, 2017 vascular ultrasound/ABI: Aorto-iliac atherosclerosis without focal stenosis. 30-49% bilateral SFA disease, without focal stenosis. Elevated ostial left PFA velocities. Occluded bilateral anterior tibial and left posterior tibial arteries, two vessel run-off on right and one vessel run-off on the left. See Arterial Doppler report. Suggest consult if clinically indicated.  EKG:  EKG July 2020 demonstrates first-degree AV block, poor R wave progression V1 through 3, prominent voltage.  Recent Labs: 02/06/2019: ALT 12; BUN 21; Creatinine, Ser 1.35; Hemoglobin 13.9; Platelets 158.0; Potassium 4.0; Sodium 138  Recent Lipid Panel    Component Value Date/Time   CHOL 157 06/11/2018 1124   CHOL 164 04/04/2015 1059   TRIG 53.0 06/11/2018 1124   TRIG 42 04/04/2015 1059   HDL 74.70 06/11/2018 1124   HDL 82 04/04/2015 1059   CHOLHDL 2 06/11/2018 1124   VLDL 10.6 06/11/2018 1124   LDLCALC 71 06/11/2018 1124   LDLCALC 74 04/04/2015 1059   LDLDIRECT 131.5 11/06/2011 1057    Physical Exam:    VS:  BP 136/64   Pulse 73   Ht 5\' 4"  (1.626 m)   Wt 180 lb 3.2 oz (81.7 kg)   SpO2 98%   BMI 30.93 kg/m     Wt Readings from Last 3 Encounters:  04/24/19 180 lb 3.2 oz (81.7 kg)  03/31/19 182 lb (82.6 kg)  02/06/19 178 lb 12.8 oz (81.1 kg)     GEN: And mildly obese. No acute distress HEENT: Normal NECK: No JVD. LYMPHATICS: No lymphadenopathy CARDIAC: 3/6 crescendo decrescendo right upper sternal border murmur of aortic stenosis.  1/6 to 2/6 diastolic murmur of regurgitation.  RRR without  edema. VASCULAR:  Normal Pulses. No bruits. RESPIRATORY:  Clear to auscultation  without rales, wheezing or rhonchi  ABDOMEN: Soft, non-tender, non-distended, No pulsatile mass, MUSCULOSKELETAL: No deformity  SKIN: Warm and dry NEUROLOGIC:  Alert and oriented x 3 PSYCHIATRIC:  Normal affect  ASSESSMENT:    1. Nonrheumatic aortic valve stenosis   2. Coronary artery disease involving native coronary artery of native heart without angina pectoris   3. Essential hypertension   4. Diabetes mellitus due to underlying condition with stage 3 chronic kidney disease, unspecified whether long term insulin use (HCC)   5. Peripheral vascular disease (HCC)   6. OSA on CPAP   7. Educated About Covid-19 Virus Infection    PLAN:    In order of problems listed above:  1. Severe calcific aortic stenosis.  Management strategies for symptomatic aortic stenosis were discussed.  Transaortic valve replacement was contrasted with surgical aortic valve replacement.  Unless there is severe coronary disease it is hoped that TAVR would be the prevailing therapy.  I recommended proceeding with work-up as he is developing symptoms but remains relatively functional.  I do not believe his age would be a significant issue.  He will need right and left heart cath to re-evaluate coronary artery disease. 2. He has stable angina.  Most recent angiography was reviewed.  He has diffuse LAD disease and also distal vessel disease in the circumflex territory. 3. Blood pressure is adequately controlled 4. Hemoglobin A1c was most recently evaluated at 6.  6 in May. 5. Has history of PAD.  Will need to investigate extensive notes of disease as this would impact the potential for TAVR via femoral approach.  Had moderate aortoiliac disease in 2016.   6. Not addressed 7. Social distancing, handwashing, and mask wearing stressed.  The patient was counseled to undergo left heart catheterization, coronary angiography, and possible percutaneous coronary intervention with stent implantation. The procedural risks and  benefits were discussed in detail. The risks discussed included death, stroke, myocardial infarction, life-threatening bleeding, limb ischemia, kidney injury, allergy, and possible emergency cardiac surgery. The risk of these significant complications were estimated to occur less than 1% of the time. After discussion, the patient has agreed to proceed.  The patient has not definitely committed to proceeding and states that he would not have coronary angiography done prior to September.  He will call us and let us know when to proceed with setting up evaluation.   Greater than 50% of the time during this office visit was spent in education, counseling, and coordination of care related to underlying disease process and testing as outlined.    Medication Adjustments/Labs and Tests Ordered: Current medicines are reviewed at length with the patient today.  Concerns regarding medicines are outlined above.  Orders Placed This Encounter  Procedures  . Basic metabolic panel   No orders of the defined types were placed in this encounter.   Patient Instructions  Medication Instructions:  Your physician recommends that you continue on your current medications as directed. Please refer to the Current Medication list given to you today.  If you need a refill on your cardiac medications before your next appointment, please call your pharmacy.   Lab work: None  If you have labs (blood work) drawn today and your tests are completely normal, you will receive your results only by: Marland Kitchen. MyChart Message (if you have MyChart) OR . A paper copy in the mail If you have any lab test that is abnormal or we need to change your treatment, we will call you to review the results.  Testing/Procedures: Your physician has requested that you have a cardiac catheterization. Cardiac catheterization is used to diagnose and/or treat various heart conditions. Doctors may recommend this procedure for a number of different  reasons. The most common reason is to evaluate chest pain. Chest pain can be a symptom of coronary artery disease (CAD), and cardiac catheterization can show whether plaque is narrowing or blocking your heart's arteries. This procedure is also used to evaluate the valves, as well as measure the blood flow and oxygen levels in different parts of your heart. For further information please visit [email or web address]. Please follow instruction sheet, as given.   Follow-Up:  Follow up will be based on your decision on whether or not to be worked up for TAVR.  Any Other Special Instructions Will Be Listed Below (If Applicable).       Signed, Lesleigh NoeHenry W Smith III, MD  04/24/2019 12:33 PM     [facility]

## 2019-04-24 ENCOUNTER — Other Ambulatory Visit: Payer: Self-pay

## 2019-04-24 ENCOUNTER — Encounter: Payer: Self-pay | Admitting: Interventional Cardiology

## 2019-04-24 ENCOUNTER — Ambulatory Visit (INDEPENDENT_AMBULATORY_CARE_PROVIDER_SITE_OTHER): Payer: Medicare Other | Admitting: Interventional Cardiology

## 2019-04-24 VITALS — BP 136/64 | HR 73 | Ht 64.0 in | Wt 180.2 lb

## 2019-04-24 DIAGNOSIS — N183 Chronic kidney disease, stage 3 (moderate): Secondary | ICD-10-CM

## 2019-04-24 DIAGNOSIS — I2510 Atherosclerotic heart disease of native coronary artery without angina pectoris: Secondary | ICD-10-CM

## 2019-04-24 DIAGNOSIS — G4733 Obstructive sleep apnea (adult) (pediatric): Secondary | ICD-10-CM

## 2019-04-24 DIAGNOSIS — I10 Essential (primary) hypertension: Secondary | ICD-10-CM | POA: Diagnosis not present

## 2019-04-24 DIAGNOSIS — E0822 Diabetes mellitus due to underlying condition with diabetic chronic kidney disease: Secondary | ICD-10-CM

## 2019-04-24 DIAGNOSIS — I350 Nonrheumatic aortic (valve) stenosis: Secondary | ICD-10-CM | POA: Diagnosis not present

## 2019-04-24 DIAGNOSIS — I739 Peripheral vascular disease, unspecified: Secondary | ICD-10-CM

## 2019-04-24 DIAGNOSIS — Z9989 Dependence on other enabling machines and devices: Secondary | ICD-10-CM

## 2019-04-24 DIAGNOSIS — Z7189 Other specified counseling: Secondary | ICD-10-CM

## 2019-04-24 NOTE — Patient Instructions (Addendum)
Medication Instructions:  Your physician recommends that you continue on your current medications as directed. Please refer to the Current Medication list given to you today.  If you need a refill on your cardiac medications before your next appointment, please call your pharmacy.   Lab work: None  If you have labs (blood work) drawn today and your tests are completely normal, you will receive your results only by: Marland Kitchen MyChart Message (if you have MyChart) OR . A paper copy in the mail If you have any lab test that is abnormal or we need to change your treatment, we will call you to review the results.  Testing/Procedures: Your physician has requested that you have a cardiac catheterization. Cardiac catheterization is used to diagnose and/or treat various heart conditions. Doctors may recommend this procedure for a number of different reasons. The most common reason is to evaluate chest pain. Chest pain can be a symptom of coronary artery disease (CAD), and cardiac catheterization can show whether plaque is narrowing or blocking your heart's arteries. This procedure is also used to evaluate the valves, as well as measure the blood flow and oxygen levels in different parts of your heart. For further information please visit HugeFiesta.[email or web address]. Please follow instruction sheet, as given.   Follow-Up:  Follow up will be based on your decision on whether or not to be worked up for TAVR.  Any Other Special Instructions Will Be Listed Below (If Applicable).   If you decide to move forward with the TAVR work-up, please contact the office so I can get you scheduled for a heart catheterization.  Dr. Tamala Julian is in the cath lab on:  August- 18th, 20th, 25th, 27th and 31st September- 1st (morning only), 3rd (morning only), 8th, 11th, 21st and 22nd  Please call with at least two dates in the event that your first choice is filled up.

## 2019-04-28 ENCOUNTER — Telehealth: Payer: Self-pay | Admitting: Interventional Cardiology

## 2019-04-28 NOTE — Telephone Encounter (Signed)
  Patient would like to speak with Dr Thompson Caul nurse to ask some questions about his care that his wife has.

## 2019-04-28 NOTE — Telephone Encounter (Signed)
Called and pt gave me permission to speak with wife and answer questions about TAVR.  Spoke with wife and discussed part of the TAVR process and what the heart cath entails.  Wife very appreciative for call.

## 2019-06-08 ENCOUNTER — Other Ambulatory Visit: Payer: Self-pay | Admitting: Interventional Cardiology

## 2019-06-19 ENCOUNTER — Ambulatory Visit (INDEPENDENT_AMBULATORY_CARE_PROVIDER_SITE_OTHER): Payer: Medicare Other

## 2019-06-19 DIAGNOSIS — Z23 Encounter for immunization: Secondary | ICD-10-CM | POA: Diagnosis not present

## 2019-06-20 ENCOUNTER — Ambulatory Visit: Payer: Medicare Other

## 2019-06-23 ENCOUNTER — Ambulatory Visit (INDEPENDENT_AMBULATORY_CARE_PROVIDER_SITE_OTHER): Payer: Medicare Other | Admitting: Internal Medicine

## 2019-06-23 ENCOUNTER — Other Ambulatory Visit: Payer: Self-pay

## 2019-06-23 ENCOUNTER — Encounter: Payer: Self-pay | Admitting: Internal Medicine

## 2019-06-23 DIAGNOSIS — H6122 Impacted cerumen, left ear: Secondary | ICD-10-CM | POA: Diagnosis not present

## 2019-06-23 NOTE — Progress Notes (Signed)
Patient consent obtained. Irrigation with water and peroxide performed. Full view of tympanic membranes after procedure.  Patient tolerated procedure well. Left ear 

## 2019-06-23 NOTE — Assessment & Plan Note (Signed)
Left ear canal with impacted cerumen resulting in decreased hearing and itching Ear lavage performed by CMA Symptoms resolved

## 2019-06-23 NOTE — Patient Instructions (Signed)
Your left ear was cleaned out today.

## 2019-06-23 NOTE — Progress Notes (Signed)
Subjective:    Patient ID: Ronnie Boyd, male    DOB: October 17, 1926, 85 y.o.   MRN: [id number]  HPI The patient is here for an acute visit.  His symptoms started about one week ago.  His hearing is decreaesed L > R.  The left ear canal also itches.  He denies any pain.  He denies any cold symptoms.  He has had issues with wax in the past and figured that is what was causing his symptoms.  He did try over-the-counter earwax removal drops, but it did not seem to work like it has in the past.   Medications and allergies reviewed with patient and updated if appropriate.  Patient Active Problem List   Diagnosis Date Noted  . History of DVT of lower extremity 06/12/2017  . Myalgia 06/11/2017  . Bilateral leg edema 05/08/2017  . Urinary frequency 04/16/2016  . Coronary artery disease involving native coronary artery   . Diabetes (HCC) 11/09/2015  . OSA on CPAP 07/20/2015  . History of stroke, lacunar 2016 03/24/2015  . Aortic stenosis, moderate 2017 01/08/2011  . HYPERCHOLESTEROLEMIA 12/01/2007  . Essential hypertension 12/01/2007  . Peripheral vascular disease (HCC) 12/01/2007  . HEMORRHOIDS 12/01/2007  . Allergic rhinitis 12/01/2007  . GERD 12/01/2007  . DEGENERATIVE JOINT DISEASE 12/01/2007    Current Outpatient Medications on File Prior to Visit  Medication Sig Dispense Refill  . albuterol (PROVENTIL HFA;VENTOLIN HFA) 108 (90 Base) MCG/ACT inhaler Inhale 2 puffs into the lungs every 6 (six) hours as needed for wheezing or shortness of breath. 1 Inhaler 0  . amLODipine (NORVASC) 5 MG tablet Take 1 tablet (5 mg total) by mouth daily. 90 tablet 1  . aspirin 81 MG chewable tablet Chew by mouth daily.    . clopidogrel (PLAVIX) 75 MG tablet TAKE 1 TABLET BY MOUTH EVERY DAY 90 tablet 3  . Coenzyme Q10 200 MG capsule Take 200 mg by mouth daily.     . fluticasone (FLONASE) 50 MCG/ACT nasal spray Place 2 sprays into both nostrils daily. 16 g 0  . Garlic Oil 1000 MG CAPS Take 1 capsule  by mouth daily.      . isosorbide mononitrate (IMDUR) 60 MG 24 hr tablet TAKE 1 TABLET BY MOUTH EVERY DAY 90 tablet 3  . loratadine (CLARITIN) 10 MG tablet Take 10 mg by mouth daily as needed for rhinitis.     Marland Kitchen losartan-hydrochlorothiazide (HYZAAR) 50-12.5 MG tablet Take 1 tablet by mouth daily. 90 tablet 1  . metoprolol tartrate (LOPRESSOR) 25 MG tablet TAKE 1 TABLET BY MOUTH TWICE A DAY 180 tablet 1  . Multiple Vitamins-Minerals (CENTRUM SILVER) tablet Take 1 tablet by mouth daily.      . nitroGLYCERIN (NITROSTAT) 0.4 MG SL tablet PLACE 1 TABLET (0.4 MG TOTAL) UNDER THE TONGUE EVERY 5 (FIVE) MINUTES AS NEEDED FOR CHEST PAIN. 25 tablet 3  . Omega-3 Fatty Acids (FISH OIL) 1000 MG CAPS Take 1 capsule by mouth daily.      . pantoprazole (PROTONIX) 40 MG tablet TAKE 1 TABLET (40 MG TOTAL) BY MOUTH DAILY. 30 tablet 11  . Pitavastatin Calcium 1 MG TABS Take 1 tablet (1 mg total) by mouth daily. 30 tablet 5  . ranolazine (RANEXA) 500 MG 12 hr tablet Take 1 tablet (500 mg total) by mouth 2 (two) times daily. Please schedule an appt for further refills. 1st attempt 180 tablet 3  . tamsulosin (FLOMAX) 0.4 MG CAPS capsule Take 0.4 mg by mouth daily.  No current facility-administered medications on file prior to visit.     Past Medical History:  Diagnosis Date  . Allergic rhinitis   . Aortic stenosis    mild echo 7/14  . Borderline type 2 diabetes mellitus   . Coronary artery disease   . Degenerative joint disease   . Family history of adverse reaction to anesthesia    "daughter had problems when she was a teen; none since; it was related to asthma"  . GERD (gastroesophageal reflux disease)   . Hemorrhoids   . History of hiatal hernia    "dx'd years ago" (06/01/2015)  . Hypercholesteremia   . Hypertension   . OSA on CPAP since 05/23/2015  . Prostatitis    hx of  . PVD (peripheral vascular disease) (HCC)   . Stroke [facility](HCC)     Past Surgical History:  Procedure Laterality Date  . CARDIAC  CATHETERIZATION [name] 03/18/2015   Procedure: Left Heart Cath and Coronary Angiography;  Surgeon: Laurey Moralealton S McLean, MD;  [facility];  Service: Cardiovascular;  Laterality: [name];  . CARDIAC CATHETERIZATION [name] 06/01/2015   Procedure: Coronary Stent Intervention;  Surgeon: Iran OuchMuhammad A Arida, MD;  Location: [facility] CV LAB;  Service: Cardiovascular;  Laterality: [name];  . CARDIAC CATHETERIZATION  11/11/2015  . CARDIAC CATHETERIZATION [name] 11/11/2015   Procedure: Intravascular Pressure Wire/FFR Study;  Surgeon: Tonny BollmanMichael Cooper, MD;  [facility];  Service: Cardiovascular;  Laterality: [name];  . CARPAL TUNNEL RELEASE Bilateral 2006-2007  . CATARACT EXTRACTION W/ INTRAOCULAR LENS  IMPLANT, BILATERAL Bilateral ~ 2003  . EXCISIONAL HEMORRHOIDECTOMY  1989    Social History   Socioeconomic History  . Marital status: Married    Spouse name: Not on file  . Number of children: Not on file  . Years of education: Not on file  . Highest education level: Not on file  Occupational History  . Occupation: retired  Engineer, productionocial Needs  . Financial resource strain: Not on file  . Food insecurity    Worry: Not on file    Inability: Not on file  . Transportation needs    Medical: Not on file    Non-medical: Not on file  Tobacco Use  . Smoking status: Never Smoker  . Smokeless tobacco: Never Used  Substance and Sexual Activity  . Alcohol use: No  . Drug use: No  . Sexual activity: Not Currently  Lifestyle  . Physical activity    Days per week: Not on file    Minutes per session: Not on file  . Stress: Not on file  Relationships  . Social Musicianconnections    Talks on phone: Not on file    Gets together: Not on file    Attends religious service: Not on file    Active member of club or organization: Not on file    Attends meetings of clubs or organizations: Not on file    Relationship status: Not on file  Other Topics Concern  . Not on file  Social History Narrative   Daughter is Mrs.  Greer PickerelMcMillan Psychologist, sport and exercise(ACES director at [employer])    Family History  Problem Relation Age of Onset  . Hypertension Mother   . Diabetes Mother [id number]  . Stroke Father 1541  . Arthritis Other   . Diabetes Brother     Review of Systems  Constitutional: Negative for chills and fever.  HENT: Positive for congestion and hearing loss. Negative for ear pain, sinus pain and sore throat.  Neurological: Negative for dizziness and headaches.       Equilibrium off at times       Objective:   Vitals:   06/23/19 1418  BP: 124/72  Pulse: 73  Resp: 16  Temp: 98.3 F (36.8 C)  SpO2: 98%   BP Readings from Last 3 Encounters:  06/23/19 124/72  04/24/19 136/64  03/31/19 (!) 148/72   Wt Readings from Last 3 Encounters:  06/23/19 177 lb (80.3 kg)  04/24/19 180 lb 3.2 oz (81.7 kg)  03/31/19 182 lb (82.6 kg)   Body mass index is 30.38 kg/m.   Physical Exam (prior to ear lavage)    GENERAL APPEARANCE: Appears stated age, well appearing, NAD EYES: conjunctiva clear, no icterus HEENT: RIGHT ear canal with small amount of cerumen, but otherwise normal.  Right tympanic membrane normal.  Left ear canal impacted with cerumen, but otherwise normal.  TM on left not visualized.  Oropharynx with no erythema, no thyromegaly, trachea midline, no cervical or supraclavicular lymphadenopathy SKIN: Warm, dry   Ear lavage performed by CMA.  See procedure note.  Assessment & Plan:    See Problem List for Assessment and Plan of chronic medical problems.

## 2019-06-27 ENCOUNTER — Other Ambulatory Visit: Payer: Self-pay | Admitting: Internal Medicine

## 2019-07-16 ENCOUNTER — Telehealth: Payer: Self-pay | Admitting: Interventional Cardiology

## 2019-07-16 NOTE — Telephone Encounter (Signed)
Left message to call back   Pt seen in August and heart cath was discussed.  Pt wanted to think about it and was to call back.  Pt has yet to contact the office.  Called to inquire where we stand with moving forward on heart cath.  Still having sx?

## 2019-07-21 ENCOUNTER — Other Ambulatory Visit: Payer: Self-pay | Admitting: Internal Medicine

## 2019-07-29 NOTE — Telephone Encounter (Signed)
Okay 

## 2019-07-29 NOTE — Telephone Encounter (Signed)
Spoke with pt and he states due to the virus and him being the sole caregiver for his wife who has Parkinson's, he is going to hold off on doing cath and TAVR work up.  States he is feeling well.  Advised if any changes, please contact the office.  Pt scheduled to see [place] in April.  Pt verbalized understanding and was appreciative for call.

## 2019-07-31 ENCOUNTER — Other Ambulatory Visit: Payer: Self-pay | Admitting: Internal Medicine

## 2019-08-01 ENCOUNTER — Other Ambulatory Visit: Payer: Self-pay | Admitting: Cardiology

## 2019-08-13 NOTE — Patient Instructions (Addendum)
  Tests ordered today. Your results will be released to MyChart (or called to you) after review.  If any changes need to be made, you will be notified at that same time.   Medications reviewed and updated.  Changes include :   none  Your prescription(s) have been submitted to your pharmacy. Please take as directed and contact our office if you believe you are having problem(s) with the medication(s).    Please followup in 6 months   

## 2019-08-13 NOTE — Progress Notes (Signed)
Subjective:    Patient ID: Ronnie Boyd, male    DOB: 08-07-1927, 85 y.o.   MRN: [id number]  HPI The patient is here for follow up.  He is not exercising regularly.    Diabetes: He is taking his medication daily as prescribed. He is compliant with a diabetic diet.  He checks his feet daily and denies foot lesions. He is up-to-date with an ophthalmology examination.   CAD, Hypertension, leg edema: He is taking his medication daily. He is compliant with a low sodium diet.  He denies chest pain, leg edema palpitations, shortness of breath and regular headaches.  H/o TIA, Hyperlipidemia: He is taking his statin, asa and plavix daily. He is compliant with a low fat/cholesterol diet. He denies myalgias.   GERD:  He is taking his medication daily as prescribed.  He denies any GERD symptoms and feels his GERD is well controlled.   OSA:  He was using his cpap nightly until he was very congested and was not able to use it.  He got out of the habit of using it.  He has not resumed using it.  His wife told him that he is not snoring and he is sleeping well.  He feels his sleep is refreshing and he does not feel tired throughout the day.  He does follow with neurology for his sleep apnea and has an appointment early next year and will discuss with them.  At this point he does not feel that he needs the CPAP.   Medications and allergies reviewed with patient and updated if appropriate.  Patient Active Problem List   Diagnosis Date Noted  . History of DVT of lower extremity 06/12/2017  . Myalgia 06/11/2017  . Bilateral leg edema 05/08/2017  . Urinary frequency 04/16/2016  . Coronary artery disease involving native coronary artery   . Diabetes (HCC) 11/09/2015  . OSA on CPAP 07/20/2015  . History of stroke, lacunar 2016 03/24/2015  . Aortic stenosis, moderate 2017 01/08/2011  . HYPERCHOLESTEROLEMIA 12/01/2007  . Essential hypertension 12/01/2007  . Peripheral vascular disease (HCC)  12/01/2007  . HEMORRHOIDS 12/01/2007  . Allergic rhinitis 12/01/2007  . GERD 12/01/2007  . DEGENERATIVE JOINT DISEASE 12/01/2007    Current Outpatient Medications on File Prior to Visit  Medication Sig Dispense Refill  . albuterol (PROVENTIL HFA;VENTOLIN HFA) 108 (90 Base) MCG/ACT inhaler Inhale 2 puffs into the lungs every 6 (six) hours as needed for wheezing or shortness of breath. 1 Inhaler 0  . amLODipine (NORVASC) 5 MG tablet TAKE 1 TABLET BY MOUTH EVERY DAY 90 tablet 0  . aspirin 81 MG chewable tablet Chew by mouth daily.    . clopidogrel (PLAVIX) 75 MG tablet TAKE 1 TABLET BY MOUTH EVERY DAY 90 tablet 3  . Coenzyme Q10 200 MG capsule Take 200 mg by mouth daily.     . fluticasone (FLONASE) 50 MCG/ACT nasal spray Place 2 sprays into both nostrils daily. 16 g 0  . Garlic Oil 1000 MG CAPS Take 1 capsule by mouth daily.      . isosorbide mononitrate (IMDUR) 60 MG 24 hr tablet TAKE 1 TABLET BY MOUTH EVERY DAY 90 tablet 3  . LIVALO 1 MG TABS TAKE 1 TABLET BY MOUTH EVERY DAY 90 tablet 1  . loratadine (CLARITIN) 10 MG tablet Take 10 mg by mouth daily as needed for rhinitis.     Marland Kitchen losartan-hydrochlorothiazide (HYZAAR) 50-12.5 MG tablet TAKE 1 TABLET BY MOUTH EVERY DAY 90 tablet 0  .  metoprolol tartrate (LOPRESSOR) 25 MG tablet TAKE 1 TABLET BY MOUTH TWICE A DAY 180 tablet 1  . Multiple Vitamins-Minerals (CENTRUM SILVER) tablet Take 1 tablet by mouth daily.      . nitroGLYCERIN (NITROSTAT) 0.4 MG SL tablet PLACE 1 TABLET (0.4 MG TOTAL) UNDER THE TONGUE EVERY 5 (FIVE) MINUTES AS NEEDED FOR CHEST PAIN. 25 tablet 3  . Omega-3 Fatty Acids (FISH OIL) 1000 MG CAPS Take 1 capsule by mouth daily.      . pantoprazole (PROTONIX) 40 MG tablet TAKE 1 TABLET (40 MG TOTAL) BY MOUTH DAILY. 30 tablet 11  . ranolazine (RANEXA) 500 MG 12 hr tablet Take 1 tablet (500 mg total) by mouth 2 (two) times daily. Please schedule an appt for further refills. 1st attempt 180 tablet 3  . tamsulosin (FLOMAX) 0.4 MG CAPS  capsule Take 0.4 mg by mouth daily.     No current facility-administered medications on file prior to visit.     Past Medical History:  Diagnosis Date  . Allergic rhinitis   . Aortic stenosis    mild echo 7/14  . Borderline type 2 diabetes mellitus   . Coronary artery disease   . Degenerative joint disease   . Family history of adverse reaction to anesthesia    "daughter had problems when she was a teen; none since; it was related to asthma"  . GERD (gastroesophageal reflux disease)   . Hemorrhoids   . History of hiatal hernia    "dx'd years ago" (06/01/2015)  . Hypercholesteremia   . Hypertension   . OSA on CPAP since 05/23/2015  . Prostatitis    hx of  . PVD (peripheral vascular disease) (HCC)   . Stroke [facility](HCC)     Past Surgical History:  Procedure Laterality Date  . CARDIAC CATHETERIZATION [name] 03/18/2015   Procedure: Left Heart Cath and Coronary Angiography;  Surgeon: Laurey Moralealton S McLean, MD;  [facility];  Service: Cardiovascular;  Laterality: [name];  . CARDIAC CATHETERIZATION [name] 06/01/2015   Procedure: Coronary Stent Intervention;  Surgeon: Iran OuchMuhammad A Arida, MD;  Location: [facility] LAB;  Service: Cardiovascular;  Laterality: [name];  . CARDIAC CATHETERIZATION  11/11/2015  . CARDIAC CATHETERIZATION [name] 11/11/2015   Procedure: Intravascular Pressure Wire/FFR Study;  Surgeon: Tonny BollmanMichael Cooper, MD;  [facility];  Service: Cardiovascular;  Laterality: [name];  . CARPAL TUNNEL RELEASE Bilateral 2006-2007  . CATARACT EXTRACTION W/ INTRAOCULAR LENS  IMPLANT, BILATERAL Bilateral ~ 2003  . EXCISIONAL HEMORRHOIDECTOMY  1989    Social History   Socioeconomic History  . Marital status: Married    Spouse name: Not on file  . Number of children: Not on file  . Years of education: Not on file  . Highest education level: Not on file  Occupational History  . Occupation: retired  Engineer, productionocial Needs  . Financial resource strain: Not on file  . Food insecurity     Worry: Not on file    Inability: Not on file  . Transportation needs    Medical: Not on file    Non-medical: Not on file  Tobacco Use  . Smoking status: Never Smoker  . Smokeless tobacco: Never Used  Substance and Sexual Activity  . Alcohol use: No  . Drug use: No  . Sexual activity: Not Currently  Lifestyle  . Physical activity    Days per week: Not on file    Minutes per session: Not on file  . Stress: Not on file  Relationships  .  Social Herbalist on phone: Not on file    Gets together: Not on file    Attends religious service: Not on file    Active member of club or organization: Not on file    Attends meetings of clubs or organizations: Not on file    Relationship status: Not on file  Other Topics Concern  . Not on file  Social History Narrative   Daughter is Mrs. Juliane Lack Runner, broadcasting/film/video at [employer])    Family History  Problem Relation Age of Onset  . Hypertension Mother   . Diabetes Mother 54  . Stroke Father 2  . Arthritis Other   . Diabetes Brother     Review of Systems  Constitutional: Negative for chills and fever.  Respiratory: Negative for cough, shortness of breath and wheezing.   Cardiovascular: Negative for chest pain, palpitations and leg swelling.  Musculoskeletal: Negative for myalgias.  Neurological: Negative for dizziness, weakness, light-headedness, numbness and headaches.       Objective:   Vitals:   08/14/19 1102  BP: 118/62  Pulse: 65  Resp: 16  Temp: 98.1 F (36.7 C)  SpO2: 98%   BP Readings from Last 3 Encounters:  08/14/19 118/62  06/23/19 124/72  04/24/19 136/64   Wt Readings from Last 3 Encounters:  08/14/19 179 lb (81.2 kg)  06/23/19 177 lb (80.3 kg)  04/24/19 180 lb 3.2 oz (81.7 kg)   Body mass index is 30.73 kg/m.   Physical Exam    Constitutional: Appears well-developed and well-nourished. No distress.  HENT:  Head: Normocephalic and atraumatic.  Neck: Neck supple. No tracheal  deviation present. No thyromegaly present.  No cervical lymphadenopathy Cardiovascular: Normal rate, regular rhythm and normal heart sounds.  3/6 systolic murmur heard. No carotid bruit .  No edema Pulmonary/Chest: Effort normal and breath sounds normal. No respiratory distress. No has no wheezes. No rales.  Skin: Skin is warm and dry. Not diaphoretic.  Psychiatric: Normal mood and affect. Behavior is normal.      Assessment & Plan:    See Problem List for Assessment and Plan of chronic medical problems.    This visit occurred during the SARS-CoV-2 public health emergency.  Safety protocols were in place, including screening questions prior to the visit, additional usage of staff PPE, and extensive cleaning of exam room while observing appropriate contact time as indicated for disinfecting solutions.

## 2019-08-14 ENCOUNTER — Other Ambulatory Visit: Payer: Self-pay

## 2019-08-14 ENCOUNTER — Ambulatory Visit (INDEPENDENT_AMBULATORY_CARE_PROVIDER_SITE_OTHER): Payer: Medicare Other | Admitting: Internal Medicine

## 2019-08-14 ENCOUNTER — Other Ambulatory Visit (INDEPENDENT_AMBULATORY_CARE_PROVIDER_SITE_OTHER): Payer: Medicare Other

## 2019-08-14 ENCOUNTER — Encounter: Payer: Self-pay | Admitting: Internal Medicine

## 2019-08-14 VITALS — BP 118/62 | HR 65 | Temp 98.1°F | Resp 16 | Ht 64.0 in | Wt 179.0 lb

## 2019-08-14 DIAGNOSIS — I10 Essential (primary) hypertension: Secondary | ICD-10-CM

## 2019-08-14 DIAGNOSIS — R600 Localized edema: Secondary | ICD-10-CM

## 2019-08-14 DIAGNOSIS — E7800 Pure hypercholesterolemia, unspecified: Secondary | ICD-10-CM

## 2019-08-14 DIAGNOSIS — E119 Type 2 diabetes mellitus without complications: Secondary | ICD-10-CM

## 2019-08-14 DIAGNOSIS — I2510 Atherosclerotic heart disease of native coronary artery without angina pectoris: Secondary | ICD-10-CM

## 2019-08-14 DIAGNOSIS — K219 Gastro-esophageal reflux disease without esophagitis: Secondary | ICD-10-CM

## 2019-08-14 DIAGNOSIS — I350 Nonrheumatic aortic (valve) stenosis: Secondary | ICD-10-CM

## 2019-08-14 LAB — COMPREHENSIVE METABOLIC PANEL
ALT: 13 U/L (ref 0–53)
AST: 16 U/L (ref 0–37)
Albumin: 4.1 g/dL (ref 3.5–5.2)
Alkaline Phosphatase: 39 U/L (ref 39–117)
BUN: 21 mg/dL (ref 6–23)
CO2: 28 mEq/L (ref 19–32)
Calcium: 9.6 mg/dL (ref 8.4–10.5)
Chloride: 102 mEq/L (ref 96–112)
Creatinine, Ser: 1.35 mg/dL (ref 0.40–1.50)
GFR: 60.07 mL/min (ref >60.00)
Glucose, Bld: 87 mg/dL (ref 70–99)
Potassium: 4 mEq/L (ref 3.5–5.1)
Sodium: 138 mEq/L (ref 135–145)
Total Bilirubin: 0.6 mg/dL (ref 0.2–1.2)
Total Protein: 7.2 g/dL (ref 6.0–8.3)

## 2019-08-14 LAB — LIPID PANEL
Cholesterol: 210 mg/dL — ABNORMAL HIGH (ref 0–200)
HDL: 80.2 mg/dL (ref >39.00)
LDL Cholesterol: 119 mg/dL — ABNORMAL HIGH (ref 0–99)
NonHDL: 129.35
Total CHOL/HDL Ratio: 3
Triglycerides: 54 mg/dL (ref 0.0–149.0)
VLDL: 10.8 mg/dL (ref 0.0–40.0)

## 2019-08-14 LAB — CBC WITH DIFFERENTIAL/PLATELET
Basophils Absolute: 0 10*3/uL (ref 0.0–0.1)
Basophils Relative: 0.9 % (ref 0.0–3.0)
Eosinophils Absolute: 0.2 10*3/uL (ref 0.0–0.7)
Eosinophils Relative: 3.2 % (ref 0.0–5.0)
HCT: 42.7 % (ref 39.0–52.0)
Hemoglobin: 13.6 g/dL (ref 13.0–17.0)
Lymphocytes Relative: 20.1 % (ref 12.0–46.0)
Lymphs Abs: 1 10*3/uL (ref 0.7–4.0)
MCHC: 31.9 g/dL (ref 30.0–36.0)
MCV: 83.9 fl (ref 78.0–100.0)
Monocytes Absolute: 0.9 10*3/uL (ref 0.1–1.0)
Monocytes Relative: 16.9 % — ABNORMAL HIGH (ref 3.0–12.0)
Neutro Abs: 3 10*3/uL (ref 1.4–7.7)
Neutrophils Relative %: 58.9 % (ref 43.0–77.0)
Platelets: 143 10*3/uL — ABNORMAL LOW (ref 150.0–400.0)
RBC: 5.09 Mil/uL (ref 4.22–5.81)
RDW: 15.8 % — ABNORMAL HIGH (ref 11.5–15.5)
WBC: 5.2 10*3/uL (ref 4.0–10.5)

## 2019-08-14 LAB — HEMOGLOBIN A1C: Hgb A1c MFr Bld: 6.4 % (ref 4.6–6.5)

## 2019-08-14 LAB — TSH: TSH: 2.77 u[IU]/mL (ref 0.35–4.50)

## 2019-08-14 NOTE — Assessment & Plan Note (Signed)
No edema Continue hydrochlorothiazide

## 2019-08-14 NOTE — Assessment & Plan Note (Signed)
GERD controlled Continue daily medication  

## 2019-08-14 NOTE — Assessment & Plan Note (Signed)
Taking livalo and tolerating it despite not tolerating other statins It is more expensive, but he is okay with paying for it since that it does work and he does not have side effects Check lipid panel, CMP, TSH

## 2019-08-14 NOTE — Assessment & Plan Note (Signed)
No chest pain, shortness of breath or palpitations Following with cardiology Continue current medications CBC, CMP, lipid panel, TSH

## 2019-08-14 NOTE — Assessment & Plan Note (Addendum)
Diet controlled A1c Continue low sugar/carbohydrate diet

## 2019-08-14 NOTE — Assessment & Plan Note (Signed)
Asymptomatic Following with cardiology Will need surgery

## 2019-08-14 NOTE — Assessment & Plan Note (Signed)
BP well controlled Current regimen effective and well tolerated Continue current medications at current doses cmp  

## 2019-08-15 ENCOUNTER — Encounter: Payer: Self-pay | Admitting: Internal Medicine

## 2019-09-23 ENCOUNTER — Other Ambulatory Visit: Payer: Self-pay | Admitting: Internal Medicine

## 2019-09-29 DIAGNOSIS — Z961 Presence of intraocular lens: Secondary | ICD-10-CM | POA: Diagnosis not present

## 2019-09-29 DIAGNOSIS — H26491 Other secondary cataract, right eye: Secondary | ICD-10-CM | POA: Diagnosis not present

## 2019-09-29 DIAGNOSIS — H43813 Vitreous degeneration, bilateral: Secondary | ICD-10-CM | POA: Diagnosis not present

## 2019-09-29 DIAGNOSIS — H524 Presbyopia: Secondary | ICD-10-CM | POA: Diagnosis not present

## 2019-11-23 ENCOUNTER — Telehealth: Payer: Self-pay

## 2019-11-23 NOTE — Telephone Encounter (Signed)
Pt's last cpap data was from October of 2020. I called pt to verify this information.  I called pt. He reports that had nasal congestion with some bleeding in October. He reports that he spoke with his PCP and was told to stop using cpap. He has not used his cpap since October 2020 so this data is accurate. He plans on keeping his appt with Aundra Millet, NP tomorrow and verbalized understanding of appt date and time.

## 2019-11-24 ENCOUNTER — Other Ambulatory Visit: Payer: Self-pay

## 2019-11-24 ENCOUNTER — Ambulatory Visit (INDEPENDENT_AMBULATORY_CARE_PROVIDER_SITE_OTHER): Payer: Medicare PPO | Admitting: Adult Health

## 2019-11-24 ENCOUNTER — Encounter: Payer: Self-pay | Admitting: Adult Health

## 2019-11-24 VITALS — BP 133/60 | HR 73 | Temp 96.9°F | Ht 65.0 in | Wt 180.0 lb

## 2019-11-24 DIAGNOSIS — G4733 Obstructive sleep apnea (adult) (pediatric): Secondary | ICD-10-CM

## 2019-11-24 DIAGNOSIS — Z9989 Dependence on other enabling machines and devices: Secondary | ICD-10-CM | POA: Diagnosis not present

## 2019-11-24 NOTE — Progress Notes (Signed)
PATIENT: Ronnie Boyd DOB: [birth year]  REASON FOR VISIT: follow up HISTORY FROM: patient  HISTORY OF PRESENT ILLNESS: Today 11/24/19:  Ronnie Boyd is a 86 year old male with a history of OSA on CPAP. She returns today for follow-up. Reports that he stopped using CPAP in October because of nasal congestion. Using Nasal Pillows. Reports nasal congestion is better but not resolved. Interested in getting restarted on CPAP.   HISTORY Interval history from 24 October 2017, I see Ronnie Boyd today in a compliance visit for CPAP use.  Ronnie Boyd is a meanwhile 86 year old African-American right-handed gentleman who has been using CPAP for well over 5 years now.  His average use of time is 7 hours and 50 minutes with 100% compliance.  He is using an AutoSet between 5 and 10 cmH2O with 3 cm pressure expiratory relief.  He has mild air leaks, 95th percentile pressure is at 10 cmH2O residual AHI is 1.4 apneas and hypopneas per hour of sleep.  Central apneas are not emerging.  There is no reason for any adjustment. [facility] is his DME.  Ronnie Boyd reports that his wife suffers from Parkinson's Disease and his family relations live in [place]. He is still very active, drives and appears energetic.   REVIEW OF SYSTEMS: Out of a complete 14 system review of symptoms, the patient complains only of the following symptoms, and all other reviewed systems are negative.  FSS 28 ESS 5  ALLERGIES: Allergies  Allergen Reactions  . Crestor [Rosuvastatin Calcium]     Muscle cramping  . Lipitor [Atorvastatin]     Leg cramping  . Other     Environmental   . Pravastatin     Leg cramping    HOME MEDICATIONS: Outpatient Medications Prior to Visit  Medication Sig Dispense Refill  . albuterol (PROVENTIL HFA;VENTOLIN HFA) 108 (90 Base) MCG/ACT inhaler Inhale 2 puffs into the lungs every 6 (six) hours as needed for wheezing or shortness of breath. 1 Inhaler 0  . amLODipine (NORVASC) 5 MG tablet TAKE 1 TABLET BY  MOUTH EVERY DAY 90 tablet 0  . aspirin 81 MG chewable tablet Chew by mouth daily.    . clopidogrel (PLAVIX) 75 MG tablet TAKE 1 TABLET BY MOUTH EVERY DAY 90 tablet 3  . Coenzyme Q10 200 MG capsule Take 200 mg by mouth daily.     . fluticasone (FLONASE) 50 MCG/ACT nasal spray Place 2 sprays into both nostrils daily. 16 g 0  . Garlic Oil 3664 MG CAPS Take 1 capsule by mouth daily.      . isosorbide mononitrate (IMDUR) 60 MG 24 hr tablet TAKE 1 TABLET BY MOUTH EVERY DAY 90 tablet 3  . LIVALO 1 MG TABS TAKE 1 TABLET BY MOUTH EVERY DAY 90 tablet 1  . loratadine (CLARITIN) 10 MG tablet Take 10 mg by mouth daily as needed for rhinitis.     Marland Kitchen losartan-hydrochlorothiazide (HYZAAR) 50-12.5 MG tablet TAKE 1 TABLET BY MOUTH EVERY DAY 90 tablet 0  . metoprolol tartrate (LOPRESSOR) 25 MG tablet TAKE 1 TABLET BY MOUTH TWICE A DAY 180 tablet 1  . Multiple Vitamins-Minerals (CENTRUM SILVER) tablet Take 1 tablet by mouth daily.      . nitroGLYCERIN (NITROSTAT) 0.4 MG SL tablet PLACE 1 TABLET (0.4 MG TOTAL) UNDER THE TONGUE EVERY 5 (FIVE) MINUTES AS NEEDED FOR CHEST PAIN. 25 tablet 3  . Omega-3 Fatty Acids (FISH OIL) 1000 MG CAPS Take 1 capsule by mouth daily.      Marland Kitchen  pantoprazole (PROTONIX) 40 MG tablet TAKE 1 TABLET (40 MG TOTAL) BY MOUTH DAILY. 30 tablet 11  . ranolazine (RANEXA) 500 MG 12 hr tablet Take 1 tablet (500 mg total) by mouth 2 (two) times daily. Please schedule an appt for further refills. 1st attempt 180 tablet 3  . tamsulosin (FLOMAX) 0.4 MG CAPS capsule Take 0.4 mg by mouth daily.     No facility-administered medications prior to visit.    PAST MEDICAL HISTORY: Past Medical History:  Diagnosis Date  . Allergic rhinitis   . Aortic stenosis    mild echo 7/14  . Borderline type 2 diabetes mellitus   . Coronary artery disease   . Degenerative joint disease   . Family history of adverse reaction to anesthesia    "daughter had problems when she was a teen; none since; it was related to asthma"   . GERD (gastroesophageal reflux disease)   . Hemorrhoids   . History of hiatal hernia    "dx'd years ago" (06/01/2015)  . Hypercholesteremia   . Hypertension   . OSA on CPAP since 05/23/2015  . Prostatitis    hx of  . PVD (peripheral vascular disease) (HCC)   . Stroke [facility])     PAST SURGICAL HISTORY: Past Surgical History:  Procedure Laterality Date  . CARDIAC CATHETERIZATION [name] 03/18/2015   Procedure: Left Heart Cath and Coronary Angiography;  Surgeon: Laurey Morale, MD;  Location[facility]VAS[facility] CV LAB;  Service: Cardiovascular;  Laterality: [name];  . CARDIAC CATHETERIZATION [name] 06/01/2015   Procedure: Coronary Stent Intervention;  Surgeon: Iran Ouch, MD;  Location: [facility] INVASIVE CV LAB;  Service: Cardiovascular;  Laterality: [name];  . CARDIAC CATHETERIZATION  11/11/2015  . CARDIAC CATHETERIZATION [name] 11/11/2015   Procedure: Intravascular Pressure Wire/FFR Study;  Surgeon: Tonny Bollman, MD;  Location: [facility] INVASIVE CV LAB;  Service: Cardiovascular;  Laterality: [name];  . CARPAL TUNNEL RELEASE Bilateral 2006-2007  . CATARACT EXTRACTION W/ INTRAOCULAR LENS  IMPLANT, BILATERAL Bilateral ~ 2003  . EXCISIONAL HEMORRHOIDECTOMY  1989    FAMILY HISTORY: Family History  Problem Relation Age of Onset  . Hypertension Mother   . Diabetes Mother 3  . Stroke Father 18  . Arthritis Other   . Diabetes Brother     SOCIAL HISTORY: Social History   Socioeconomic History  . Marital status: Married    Spouse name: Not on file  . Number of children: Not on file  . Years of education: Not on file  . Highest education level: Not on file  Occupational History  . Occupation: retired  Tobacco Use  . Smoking status: Never Smoker  . Smokeless tobacco: Never Used  Substance and Sexual Activity  . Alcohol use: No  . Drug use: No  . Sexual activity: Not Currently  Other Topics Concern  . Not on file  Social History Narrative   Daughter is Ronnie Boyd Psychologist, sport and exercise at [employer])    Social Determinants of Corporate investment banker Strain:   . Difficulty of Paying Living Expenses:   Food Insecurity:   . Worried About Programme researcher, broadcasting/film/video in the Last Year:   . Barista in the Last Year:   Transportation Needs:   . Freight forwarder (Medical):   Marland Kitchen Lack of Transportation (Non-Medical):   Physical Activity:   . Days of Exercise per Week:   . Minutes of Exercise per Session:   Stress:   . Feeling of Stress :   Social Connections:   .  Frequency of Communication with Friends and Family:   . Frequency of Social Gatherings with Friends and Family:   . Attends Religious Services:   . Active Member of Clubs or Organizations:   . Attends Banker Meetings:   Marland Kitchen Marital Status:   Intimate Partner Violence:   . Fear of Current or Ex-Partner:   . Emotionally Abused:   Marland Kitchen Physically Abused:   . Sexually Abused:       PHYSICAL EXAM  Vitals:   11/24/19 1104  BP: 133/60  Pulse: 73  Temp: (!) 96.9 F (36.1 C)  Weight: 180 lb (81.6 kg)  Height: 5\' 5"  (1.651 m)   Body mass index is 29.95 kg/m.  Generalized: Well developed, in no acute distress  Chest: Lungs clear to auscultation bilaterally  Neurological examination  Mentation: Alert oriented to time, place, history taking. Follows all commands speech and language fluent Cranial nerve II-XII: Extraocular movements were full, visual field were full on confrontational test Head turning and shoulder shrug  were normal and symmetric. Motor: The motor testing reveals 5 over 5 strength of all 4 extremities. Good symmetric motor tone is noted throughout.  Sensory: Sensory testing is intact to soft touch on all 4 extremities. No evidence of extinction is noted.  Gait and station: Gait is normal.    DIAGNOSTIC DATA (LABS, IMAGING, TESTING) - I reviewed patient records, labs, notes, testing and imaging myself where available.  Lab Results  Component Value Date   WBC 5.2 08/14/2019   HGB  13.6 08/14/2019   HCT 42.7 08/14/2019   MCV 83.9 08/14/2019   PLT 143.0 (L) 08/14/2019      Component Value Date/Time   NA 138 08/14/2019 1150   NA 140 10/29/2016 1242   K 4.0 08/14/2019 1150   CL 102 08/14/2019 1150   CO2 28 08/14/2019 1150   GLUCOSE 87 08/14/2019 1150   BUN 21 08/14/2019 1150   BUN 18 10/29/2016 1242   CREATININE 1.35 08/14/2019 1150   CREATININE 1.53 (H) 08/27/2016 1529   CALCIUM 9.6 08/14/2019 1150   PROT 7.2 08/14/2019 1150   ALBUMIN 4.1 08/14/2019 1150   AST 16 08/14/2019 1150   ALT 13 08/14/2019 1150   ALKPHOS 39 08/14/2019 1150   BILITOT 0.6 08/14/2019 1150   GFRNONAA 44 (L) 10/29/2016 1242   GFRAA 51 (L) 10/29/2016 1242   Lab Results  Component Value Date   CHOL 210 (H) 08/14/2019   HDL 80.20 08/14/2019   LDLCALC 119 (H) 08/14/2019   LDLDIRECT 131.5 11/06/2011   TRIG 54.0 08/14/2019   CHOLHDL 3 08/14/2019   Lab Results  Component Value Date   HGBA1C 6.4 08/14/2019    Lab Results  Component Value Date   TSH 2.77 08/14/2019      ASSESSMENT AND PLAN 86 y.o. year old male  has a past medical history of Allergic rhinitis, Aortic stenosis, Borderline type 2 diabetes mellitus, Coronary artery disease, Degenerative joint disease, Family history of adverse reaction to anesthesia, GERD (gastroesophageal reflux disease), Hemorrhoids, History of hiatal hernia, Hypercholesteremia, Hypertension, OSA on CPAP (since 05/23/2015), Prostatitis, PVD (peripheral vascular disease) (HCC), and Stroke (HCC). here with:  1. OSA on CPAP  - Restart CPAP therapy - Mask refitting- full face mask - Encourage patient to use CPAP nightly and > 4 hours each night - F/U in 6 month or sooner if needed   I spent 15  minutes of face-to-face and non-face-to-face time with patient.  This included previsit chart review, lab  review, study review, order entry, electronic health record documentation, patient education.  Butch Penny, MSN, NP-C 11/24/2019, 11:16 AM [facility] [address], [address] [place], [place] [address] [phone]

## 2019-11-24 NOTE — Patient Instructions (Addendum)
Your Plan:  Restart CPAP  Mask refitting If your symptoms worsen or you develop new symptoms please let us know.  Please call [employer] at [phone], extension [phone] to discuss your concerns. Their customer service representatives will be glad to assist you. If they do not answer, please leave a message and they will call you back. Make sure to leave your name and return phone number. If you do not get a response from them in 3 business days, please let our office know.    Thank you for coming to see [place] at [facility]. I hope we have been able to provide you high quality care today.  You may receive a patient satisfaction survey over the next few weeks. We would appreciate your feedback and comments so that we may continue to improve ourselves and the health of our patients.

## 2019-11-24 NOTE — Progress Notes (Addendum)
[employer] [facility] CM sent for new order cpap refitting.  Received.

## 2019-11-26 ENCOUNTER — Other Ambulatory Visit: Payer: Self-pay | Admitting: Neurology

## 2019-12-01 DIAGNOSIS — G4733 Obstructive sleep apnea (adult) (pediatric): Secondary | ICD-10-CM | POA: Diagnosis not present

## 2019-12-17 ENCOUNTER — Other Ambulatory Visit: Payer: Self-pay | Admitting: Internal Medicine

## 2019-12-20 ENCOUNTER — Other Ambulatory Visit: Payer: Self-pay | Admitting: Internal Medicine

## 2020-01-30 ENCOUNTER — Other Ambulatory Visit: Payer: Self-pay | Admitting: Internal Medicine

## 2020-02-09 NOTE — Progress Notes (Signed)
Cardiology Office Note:    Date:  02/09/2020   ID:  Ronnie Boyd, DOB 1927/08/15, MRN [id number]  PCP:  Ronnie Sanes, MD  Cardiologist:  No primary care provider on file.   Referring MD: Ronnie Sanes, MD   No chief complaint on file.   History of Present Illness:    Ronnie Boyd is a 86 y.o. male with a hx of severe aortic stenosis documented by recent echo, CAD with stents in the mid LAD, distal LAD, and ramus intermedius, hypertension, diabetes II, peripheral arterial disease, and obstructive sleep apnea. Musculoskeletal discomfort statin intolerance to Lipitor, pravastatin, and Crestor.  Mr. General feels well.  He has not needed to use nitroglycerin.  He denies angina.  He has not had syncope.  There is no significant dyspnea or exertion intolerance for the activities that are required in his life.  He is able to lie down and sleep well.  He has not had syncope.  We had discussion concerning severe aortic stenosis.  He has whether having a valve replacement would help him to live longer.  He was told there is no data especially in the setting of being asymptomatic as he is currently.  The only advantage to an aortic valve replacement at some point in the future might be to remove symptoms that are causing him to become dependent and having poor quality of life.  He is not sure he would consent to TAVR but is willing to consider it as time goes along.  We also discussed how to follow the aortic valve problem.  He is not wild about the idea of having multiple testing done as he is not convinced that he will have treatment under any circumstance.  Past Medical History:  Diagnosis Date   Allergic rhinitis    Aortic stenosis    mild echo 7/14   Borderline type 2 diabetes mellitus    Coronary artery disease    Degenerative joint disease    Family history of adverse reaction to anesthesia    "daughter had problems when she was a teen; none since; it was related to asthma"    GERD (gastroesophageal reflux disease)    Hemorrhoids    History of hiatal hernia    "dx'd years ago" (06/01/2015)   Hypercholesteremia    Hypertension    OSA on CPAP since 05/23/2015   Prostatitis    hx of   PVD (peripheral vascular disease) (HCC)    Stroke [facility])     Past Surgical History:  Procedure Laterality Date   CARDIAC CATHETERIZATION [name] 03/18/2015   Procedure: Left Heart Cath and Coronary Angiography;  Surgeon: Laurey Morale, MD;  Location[facility] INVASIVE CV LAB;  Service: Cardiovascular;  Laterality: [name];   CARDIAC CATHETERIZATION [name] 06/01/2015   Procedure: Coronary Stent Intervention;  Surgeon: Iran Ouch, MD;  Location: [facility] INVASIVE CV LAB;  Service: Cardiovascular;  Laterality: [name];   CARDIAC CATHETERIZATION  11/11/2015   CARDIAC CATHETERIZATION [name] 11/11/2015   Procedure: Intravascular Pressure Wire/FFR Study;  Surgeon: Tonny Bollman, MD;  Location: [facility] INVASIVE CV LAB;  Service: Cardiovascular;  Laterality: [name];   CARPAL TUNNEL RELEASE Bilateral 2006-2007   CATARACT EXTRACTION W/ INTRAOCULAR LENS  IMPLANT, BILATERAL Bilateral ~ 2003   EXCISIONAL HEMORRHOIDECTOMY  1989    Current Medications: No outpatient medications have been marked as taking for the 02/11/20 encounter (Appointment) with Lyn Records, MD.     Allergies:   Crestor [rosuvastatin calcium], Lipitor [atorvastatin], Other, and  Pravastatin   Social History   Socioeconomic History   Marital status: Married    Spouse name: Not on file   Number of children: Not on file   Years of education: Not on file   Highest education level: Not on file  Occupational History   Occupation: retired  Tobacco Use   Smoking status: Never Smoker   Smokeless tobacco: Never Used  Substance and Sexual Activity   Alcohol use: No   Drug use: No   Sexual activity: Not Currently  Other Topics Concern   Not on file  Social History Narrative   Daughter is [name] Psychologist, sport and exercise at [employer])   Social Determinants of Corporate investment banker Strain:    Difficulty of Paying Living Expenses:   Food Insecurity:    Worried About Programme researcher, broadcasting/film/video in the Last Year:    Barista in the Last Year:   Transportation Needs:    Freight forwarder (Medical):    Lack of Transportation (Non-Medical):   Physical Activity:    Days of Exercise per Week:    Minutes of Exercise per Session:   Stress:    Feeling of Stress :   Social Connections:    Frequency of Communication with Friends and Family:    Frequency of Social Gatherings with Friends and Family:    Attends Religious Services:    Active Member of Clubs or Organizations:    Attends Engineer, structural:    Marital Status:      Family History: The patient's family history includes Arthritis in an other family member; Diabetes in his brother; Diabetes (age of onset: 63) in his mother; Hypertension in his mother; Stroke (age of onset: 19) in his father.  ROS:   Please see the history of present illness.    Loud snoring according to her husband.  Awakens frequently to urinate.  Overall very reasonable and able to make decisions.  Appetite is good.  Feels he has been blessed by God to make it to 86 years of age has an African-American male.  All other systems reviewed and are negative.  EKGs/Labs/Other Studies Reviewed:    The following studies were reviewed today: 2D Doppler echocardiogram July 2020: IMPRESSIONS    1. The left ventricle has hyperdynamic systolic function, with an  ejection fraction of >65%. The cavity size was normal. There is moderately  increased left ventricular wall thickness. Left ventricular diastolic  Doppler parameters are consistent with  impaired relaxation. Elevated left atrial and left ventricular  end-diastolic pressures The E/e' is >20. No evidence of left ventricular  regional wall motion abnormalities.  2. The right ventricle has normal  systolic function. The cavity was  normal. There is no increase in right ventricular wall thickness.  3. Left atrial size was mildly dilated.  4. The mitral valve is abnormal. Mild thickening of the mitral valve  leaflet. There is mild mitral annular calcification present.  5. The tricuspid valve is grossly normal.  6. The aortic valve has an indeterminate number of cusps. Severe  calcifcation of the aortic valve. Aortic valve regurgitation is mild by  color flow Doppler. Severe stenosis of the aortic valve.  7. The aorta is normal in size and structure.   EKG:  EKG sinus rhythm, left anterior hemiblock, first-degree AV block, otherwise normal appearing tracing.  When compared to the prior tracing from July 2020, no significant change has occurred.  Recent Labs: 08/14/2019: ALT 13;  BUN 21; Creatinine, Ser 1.35; Hemoglobin 13.6; Platelets 143.0; Potassium 4.0; Sodium 138; TSH 2.77  Recent Lipid Panel    Component Value Date/Time   CHOL 210 (H) 08/14/2019 1150   CHOL 164 04/04/2015 1059   TRIG 54.0 08/14/2019 1150   TRIG 42 04/04/2015 1059   HDL 80.20 08/14/2019 1150   HDL 82 04/04/2015 1059   CHOLHDL 3 08/14/2019 1150   VLDL 10.8 08/14/2019 1150   LDLCALC 119 (H) 08/14/2019 1150   LDLCALC 74 04/04/2015 1059   LDLDIRECT 131.5 11/06/2011 1057    Physical Exam:    VS:  There were no vitals taken for this visit.    Wt Readings from Last 3 Encounters:  11/24/19 180 lb (81.6 kg)  08/14/19 179 lb (81.2 kg)  06/23/19 177 lb (80.3 kg)     GEN: Moderate obesity. No acute distress HEENT: Normal NECK: No JVD. LYMPHATICS: No lymphadenopathy CARDIAC: 3/6 to 4/6 crescendo decrescendo systolic murmur RRR without diastolic murmur, gallop, or edema. VASCULAR:  Normal Pulses. No bruits. RESPIRATORY:  Clear to auscultation without rales, wheezing or rhonchi  ABDOMEN: Soft, non-tender, non-distended, No pulsatile mass, MUSCULOSKELETAL: No deformity  SKIN: Warm and dry NEUROLOGIC:   Alert and oriented x 3 PSYCHIATRIC:  Normal affect   ASSESSMENT:    1. Coronary artery disease involving native coronary artery of native heart without angina pectoris   2. Essential hypertension   3. Nonrheumatic aortic valve stenosis   4. OSA on CPAP   5. Peripheral vascular disease ([place])   6. Educated about COVID-19 virus infection    PLAN:    In order of problems listed above:  1. No anginal symptoms.  Secondary prevention is discussed. 2. Blood pressure control is reasonable for age at [age].  We will continue Hyzaar, Lopressor, Imdur, Norvasc, and Ranexa. 3. Severe calcific aortic stenosis.  The patient feels he is asymptomatic.  He has not had functional testing.  He is doubtful that he will want advanced therapy for his valve problem.  He is keeping open the line of communication and possibility of treatment if it makes sense to him.  Right now he feels he is asymptomatic, and that valve therapy will not help him to live longer since he is already 24.  We will see him back in 6 to 10 months.  Earlier if symptoms.  He has decided that he is not want to have a repeat echo done at this time. 4. Encouraged compliance with CPAP 5. Did not discuss PAD 6. He complains of the cost of Livalo.  We will convert to Crestor 5 mg Monday Wednesday and Friday or even less if lower extremity pain resumes.  Lipid panel and liver panel in 3 months. 7. He has been vaccinated.  Overall education and awareness concerning primary/secondary risk prevention was discussed in detail: LDL less than 70, hemoglobin A1c less than 7, blood pressure target less than 130/80 mmHg, >150 minutes of moderate aerobic activity per week, avoidance of smoking, weight control (via diet and exercise), and continued surveillance/management of/for obstructive sleep apnea.  Natural history of aortic valve stenosis was discussed in detail.  Cardinal symptoms of angina, syncope, and dyspnea were reviewed and significance relative  to prognosis was described.  The importance of sequential imaging for disease monitoring was emphasized.  Work-up including possible heart catheterization and CT angiography were described as essential components of staging for therapy.  Treatment options, TAVR and SAVR, were discussed in some detail with emphasis on TAVR.  Medication Adjustments/Labs and Tests Ordered: Current medicines are reviewed at length with the patient today.  Concerns regarding medicines are outlined above.  No orders of the defined types were placed in this encounter.  No orders of the defined types were placed in this encounter.   There are no Patient Instructions on file for this visit.   Signed, Lesleigh Noe, MD  02/09/2020 8:04 PM    [facility]

## 2020-02-11 ENCOUNTER — Ambulatory Visit: Payer: Medicare PPO | Admitting: Interventional Cardiology

## 2020-02-11 ENCOUNTER — Other Ambulatory Visit: Payer: Self-pay

## 2020-02-11 ENCOUNTER — Encounter: Payer: Self-pay | Admitting: Interventional Cardiology

## 2020-02-11 ENCOUNTER — Telehealth: Payer: Self-pay | Admitting: Interventional Cardiology

## 2020-02-11 VITALS — BP 142/72 | HR 66 | Ht 65.0 in | Wt 177.1 lb

## 2020-02-11 DIAGNOSIS — Z9989 Dependence on other enabling machines and devices: Secondary | ICD-10-CM

## 2020-02-11 DIAGNOSIS — Z7189 Other specified counseling: Secondary | ICD-10-CM

## 2020-02-11 DIAGNOSIS — I2510 Atherosclerotic heart disease of native coronary artery without angina pectoris: Secondary | ICD-10-CM

## 2020-02-11 DIAGNOSIS — G4733 Obstructive sleep apnea (adult) (pediatric): Secondary | ICD-10-CM

## 2020-02-11 DIAGNOSIS — I10 Essential (primary) hypertension: Secondary | ICD-10-CM

## 2020-02-11 DIAGNOSIS — I350 Nonrheumatic aortic (valve) stenosis: Secondary | ICD-10-CM | POA: Diagnosis not present

## 2020-02-11 DIAGNOSIS — I739 Peripheral vascular disease, unspecified: Secondary | ICD-10-CM | POA: Diagnosis not present

## 2020-02-11 DIAGNOSIS — E7800 Pure hypercholesterolemia, unspecified: Secondary | ICD-10-CM

## 2020-02-11 MED ORDER — ROSUVASTATIN CALCIUM 5 MG PO TABS: 5.0000 mg | ORAL_TABLET | ORAL | 3 refills | Status: DC

## 2020-02-11 NOTE — Telephone Encounter (Signed)
Called pt and left message to call back to schedule labs.  After pt left the office, Dr. Katrinka Blazing said they agreed to d/c Livalo and start Rosuvastatin 5mg  MWF and get Lipid/Liver in 3 months.  Orders in and medication sent to pharmacy.  Just need labs scheduled.

## 2020-02-11 NOTE — Patient Instructions (Addendum)
Medication Instructions:  1) DISCONTINUE Livalo 2) START Rosuvastatin 5mg  once daily on Monday, Wednesday and Friday.  *If you need a refill on your cardiac medications before your next appointment, please call your pharmacy*   Lab Work: Lipid and Liver in 3 months.  You will need to be fasting for these labs (nothing to eat or drink after midnight except water and black coffee).  If you have labs (blood work) drawn today and your tests are completely normal, you will receive your results only by: Thursday MyChart Message (if you have MyChart) OR . A paper copy in the mail If you have any lab test that is abnormal or we need to change your treatment, we will call you to review the results.   Testing/Procedures: None   Follow-Up: At [facility], you and your health needs are our priority.  As part of our continuing mission to provide you with exceptional heart care, we have created designated Provider Care Teams.  These Care Teams include your primary Cardiologist (physician) and Advanced Practice Providers (APPs -  Physician Assistants and Nurse Practitioners) who all work together to provide you with the care you need, when you need it.  We recommend signing up for the patient portal called "MyChart".  Sign up information is provided on this After Visit Summary.  MyChart is used to connect with patients for Virtual Visits (Telemedicine).  Patients are able to view lab/test results, encounter notes, upcoming appointments, etc.  Non-urgent messages can be sent to your provider as well.   To learn more about what you can do with MyChart, go to [facility].    Your next appointment:   9-10 month(s)  The format for your next appointment:   In Person  Provider:   You may see Dr. [email or web address] or one of the following Advanced Practice Providers on your designated Care Team:    Verdis Prime, NP  Norma Fredrickson, NP  Nada Boozer, NP    Other Instructions

## 2020-02-12 ENCOUNTER — Other Ambulatory Visit: Payer: Self-pay | Admitting: Internal Medicine

## 2020-02-16 NOTE — Patient Instructions (Addendum)
°  Blood work was ordered.   ° ° °Medications reviewed and updated.  Changes include :   none ° ° ° °Please followup in 6 months ° ° °

## 2020-02-16 NOTE — Progress Notes (Signed)
Subjective:    Patient ID: Ronnie Boyd, male    DOB: 06/02/1927, 86 y.o.   MRN: [id number]  HPI The patient is here for follow up of their chronic medical problems, including DM, CAD, htn, leg edema, h/o TIA, hyperlipidemia, GERD, OSA on cpap  He is taking all of his medications as prescribed.     He is active, does some walking     He has no concerns.  Medications and allergies reviewed with patient and updated if appropriate.  Patient Active Problem List   Diagnosis Date Noted  . History of DVT of lower extremity 06/12/2017  . Bilateral leg edema 05/08/2017  . Coronary artery disease involving native coronary artery   . Diabetes (HCC) 11/09/2015  . OSA on CPAP 07/20/2015  . History of stroke, lacunar 2016 03/24/2015  . Aortic stenosis, severe  01/08/2011  . HYPERCHOLESTEROLEMIA 12/01/2007  . Essential hypertension 12/01/2007  . Peripheral vascular disease (HCC) 12/01/2007  . HEMORRHOIDS 12/01/2007  . Allergic rhinitis 12/01/2007  . GERD 12/01/2007  . DEGENERATIVE JOINT DISEASE 12/01/2007    Current Outpatient Medications on File Prior to Visit  Medication Sig Dispense Refill  . albuterol (PROVENTIL HFA;VENTOLIN HFA) 108 (90 Base) MCG/ACT inhaler Inhale 2 puffs into the lungs every 6 (six) hours as needed for wheezing or shortness of breath. 1 Inhaler 0  . amLODipine (NORVASC) 5 MG tablet TAKE 1 TABLET BY MOUTH EVERY DAY 90 tablet 0  . aspirin 81 MG chewable tablet Chew by mouth daily.    . clopidogrel (PLAVIX) 75 MG tablet TAKE 1 TABLET BY MOUTH EVERY DAY 90 tablet 3  . Coenzyme Q10 200 MG capsule Take 200 mg by mouth daily.     . fluticasone (FLONASE) 50 MCG/ACT nasal spray Place 2 sprays into both nostrils daily. 16 g 0  . Garlic Oil 1000 MG CAPS Take 1 capsule by mouth daily.      . isosorbide mononitrate (IMDUR) 60 MG 24 hr tablet TAKE 1 TABLET BY MOUTH EVERY DAY 90 tablet 3  . loratadine (CLARITIN) 10 MG tablet Take 10 mg by mouth daily as needed for  rhinitis.     Marland Kitchen losartan-hydrochlorothiazide (HYZAAR) 50-12.5 MG tablet TAKE 1 TABLET BY MOUTH EVERY DAY 90 tablet 0  . metoprolol tartrate (LOPRESSOR) 25 MG tablet TAKE 1 TABLET BY MOUTH TWICE A DAY 180 tablet 1  . Multiple Vitamins-Minerals (CENTRUM SILVER) tablet Take 1 tablet by mouth daily.      . nitroGLYCERIN (NITROSTAT) 0.4 MG SL tablet PLACE 1 TABLET (0.4 MG TOTAL) UNDER THE TONGUE EVERY 5 (FIVE) MINUTES AS NEEDED FOR CHEST PAIN. 25 tablet 3  . Omega-3 Fatty Acids (FISH OIL) 1000 MG CAPS Take 1 capsule by mouth daily.      . pantoprazole (PROTONIX) 40 MG tablet TAKE 1 TABLET (40 MG TOTAL) BY MOUTH DAILY. 30 tablet 11  . ranolazine (RANEXA) 500 MG 12 hr tablet Take 1 tablet (500 mg total) by mouth 2 (two) times daily. Please schedule an appt for further refills. 1st attempt 180 tablet 3  . rosuvastatin (CRESTOR) 5 MG tablet Take 1 tablet (5 mg total) by mouth every Monday, Wednesday, and Friday. 45 tablet 3  . tamsulosin (FLOMAX) 0.4 MG CAPS capsule Take 0.4 mg by mouth daily.     No current facility-administered medications on file prior to visit.    Past Medical History:  Diagnosis Date  . Allergic rhinitis   . Aortic stenosis    mild echo 7/14  .  Borderline type 2 diabetes mellitus   . Coronary artery disease   . Degenerative joint disease   . Family history of adverse reaction to anesthesia    "daughter had problems when she was a teen; none since; it was related to asthma"  . GERD (gastroesophageal reflux disease)   . Hemorrhoids   . History of hiatal hernia    "dx'd years ago" (06/01/2015)  . Hypercholesteremia   . Hypertension   . OSA on CPAP since 05/23/2015  . Prostatitis    hx of  . PVD (peripheral vascular disease) (HCC)   . Stroke [facility])     Past Surgical History:  Procedure Laterality Date  . CARDIAC CATHETERIZATION [name] 03/18/2015   Procedure: Left Heart Cath and Coronary Angiography;  Surgeon: Laurey Morale, MD;  Location[facility] INVAS[facility] CV LAB;  Service:  Cardiovascular;  Laterality: [name];  . CARDIAC CATHETERIZATION [name] 06/01/2015   Procedure: Coronary Stent Intervention;  Surgeon: Iran Ouch, MD;  Location: [facility] INVASIVE CV LAB;  Service: Cardiovascular;  Laterality: [name];  . CARDIAC CATHETERIZATION  11/11/2015  . CARDIAC CATHETERIZATION [name] 11/11/2015   Procedure: Intravascular Pressure Wire/FFR Study;  Surgeon: Tonny Bollman, MD;  Location: [facility] INVASIVE CV LAB;  Service: Cardiovascular;  Laterality: [name];  . CARPAL TUNNEL RELEASE Bilateral 2006-2007  . CATARACT EXTRACTION W/ INTRAOCULAR LENS  IMPLANT, BILATERAL Bilateral ~ 2003  . EXCISIONAL HEMORRHOIDECTOMY  1989    Social History   Socioeconomic History  . Marital status: Married    Spouse name: Not on file  . Number of children: Not on file  . Years of education: Not on file  . Highest education level: Not on file  Occupational History  . Occupation: retired  Tobacco Use  . Smoking status: Never Smoker  . Smokeless tobacco: Never Used  Substance and Sexual Activity  . Alcohol use: No  . Drug use: No  . Sexual activity: Not Currently  Other Topics Concern  . Not on file  Social History Narrative   Daughter is [name] Psychologist, sport and exercise at [employer])   Social Determinants of Corporate investment banker Strain:   . Difficulty of Paying Living Expenses:   Food Insecurity:   . Worried About Programme researcher, broadcasting/film/video in the Last Year:   . Barista in the Last Year:   Transportation Needs:   . Freight forwarder (Medical):   Marland Kitchen Lack of Transportation (Non-Medical):   Physical Activity:   . Days of Exercise per Week:   . Minutes of Exercise per Session:   Stress:   . Feeling of Stress :   Social Connections:   . Frequency of Communication with Friends and Family:   . Frequency of Social Gatherings with Friends and Family:   . Attends Religious Services:   . Active Member of Clubs or Organizations:   . Attends Banker Meetings:   Marland Kitchen Marital  Status:     Family History  Problem Relation Age of Onset  . Hypertension Mother   . Diabetes Mother 36  . Stroke Father 79  . Arthritis Other   . Diabetes Brother     Review of Systems  Constitutional: Negative for fever.  Respiratory: Positive for shortness of breath (some with exertion). Negative for cough and wheezing.   Cardiovascular: Negative for chest pain, palpitations and leg swelling.  Neurological: Positive for dizziness (occ, transient). Negative for light-headedness and headaches.       Objective:   Vitals:  02/17/20 1138  BP: 120/68  Pulse: 87  Temp: 98.1 F (36.7 C)  SpO2: 96%   BP Readings from Last 3 Encounters:  02/17/20 120/68  02/11/20 (!) 142/72  11/24/19 133/60   Wt Readings from Last 3 Encounters:  02/17/20 174 lb 12.8 oz (79.3 kg)  02/11/20 177 lb 1.9 oz (80.3 kg)  11/24/19 180 lb (81.6 kg)   Body mass index is 29.09 kg/m.   Physical Exam    Constitutional: Appears well-developed and well-nourished. No distress.  HENT:  Head: Normocephalic and atraumatic.  Neck: Neck supple. No tracheal deviation present. No thyromegaly present.  No cervical lymphadenopathy Cardiovascular: Normal rate, regular rhythm and normal heart sounds.   3/6 systolic murmur heard. No carotid bruit .  No edema Pulmonary/Chest: Effort normal and breath sounds normal. No respiratory distress. No has no wheezes. No rales.  Skin: Skin is warm and dry. Not diaphoretic.  Psychiatric: Normal mood and affect. Behavior is normal.      Assessment & Plan:    See Problem List for Assessment and Plan of chronic medical problems.    This visit occurred during the SARS-CoV-2 public health emergency.  Safety protocols were in place, including screening questions prior to the visit, additional usage of staff PPE, and extensive cleaning of exam room while observing appropriate contact time as indicated for disinfecting solutions.

## 2020-02-17 ENCOUNTER — Ambulatory Visit: Payer: Medicare PPO | Admitting: Internal Medicine

## 2020-02-17 ENCOUNTER — Other Ambulatory Visit: Payer: Self-pay

## 2020-02-17 ENCOUNTER — Encounter: Payer: Self-pay | Admitting: Internal Medicine

## 2020-02-17 VITALS — BP 120/68 | HR 87 | Temp 98.1°F | Ht 65.0 in | Wt 174.8 lb

## 2020-02-17 DIAGNOSIS — E7800 Pure hypercholesterolemia, unspecified: Secondary | ICD-10-CM | POA: Diagnosis not present

## 2020-02-17 DIAGNOSIS — I2510 Atherosclerotic heart disease of native coronary artery without angina pectoris: Secondary | ICD-10-CM

## 2020-02-17 DIAGNOSIS — K219 Gastro-esophageal reflux disease without esophagitis: Secondary | ICD-10-CM

## 2020-02-17 DIAGNOSIS — E119 Type 2 diabetes mellitus without complications: Secondary | ICD-10-CM | POA: Diagnosis not present

## 2020-02-17 DIAGNOSIS — R600 Localized edema: Secondary | ICD-10-CM

## 2020-02-17 DIAGNOSIS — I10 Essential (primary) hypertension: Secondary | ICD-10-CM | POA: Diagnosis not present

## 2020-02-17 LAB — LIPID PANEL
Cholesterol: 194 mg/dL (ref 0–200)
HDL: 73.4 mg/dL (ref >39.00)
LDL Cholesterol: 111 mg/dL — ABNORMAL HIGH (ref 0–99)
NonHDL: 120.49
Total CHOL/HDL Ratio: 3
Triglycerides: 45 mg/dL (ref 0.0–149.0)
VLDL: 9 mg/dL (ref 0.0–40.0)

## 2020-02-17 LAB — CBC WITH DIFFERENTIAL/PLATELET
Basophils Absolute: 0 10*3/uL (ref 0.0–0.1)
Basophils Relative: 0.8 % (ref 0.0–3.0)
Eosinophils Absolute: 0.1 10*3/uL (ref 0.0–0.7)
Eosinophils Relative: 2.9 % (ref 0.0–5.0)
HCT: 41.3 % (ref 39.0–52.0)
Hemoglobin: 13.3 g/dL (ref 13.0–17.0)
Lymphocytes Relative: 20.8 % (ref 12.0–46.0)
Lymphs Abs: 1 10*3/uL (ref 0.7–4.0)
MCHC: 32.2 g/dL (ref 30.0–36.0)
MCV: 84.8 fl (ref 78.0–100.0)
Monocytes Absolute: 0.9 10*3/uL (ref 0.1–1.0)
Monocytes Relative: 19.2 % — ABNORMAL HIGH (ref 3.0–12.0)
Neutro Abs: 2.6 10*3/uL (ref 1.4–7.7)
Neutrophils Relative %: 56.3 % (ref 43.0–77.0)
Platelets: 149 10*3/uL — ABNORMAL LOW (ref 150.0–400.0)
RBC: 4.87 Mil/uL (ref 4.22–5.81)
RDW: 14.9 % (ref 11.5–15.5)
WBC: 4.7 10*3/uL (ref 4.0–10.5)

## 2020-02-17 LAB — COMPREHENSIVE METABOLIC PANEL
ALT: 12 U/L (ref 0–53)
AST: 15 U/L (ref 0–37)
Albumin: 4.1 g/dL (ref 3.5–5.2)
Alkaline Phosphatase: 36 U/L — ABNORMAL LOW (ref 39–117)
BUN: 21 mg/dL (ref 6–23)
CO2: 28 mEq/L (ref 19–32)
Calcium: 9.2 mg/dL (ref 8.4–10.5)
Chloride: 106 mEq/L (ref 96–112)
Creatinine, Ser: 1.45 mg/dL (ref 0.40–1.50)
GFR: 55.25 mL/min — ABNORMAL LOW (ref >60.00)
Glucose, Bld: 104 mg/dL — ABNORMAL HIGH (ref 70–99)
Potassium: 3.7 mEq/L (ref 3.5–5.1)
Sodium: 140 mEq/L (ref 135–145)
Total Bilirubin: 0.6 mg/dL (ref 0.2–1.2)
Total Protein: 7.1 g/dL (ref 6.0–8.3)

## 2020-02-17 LAB — HEMOGLOBIN A1C: Hgb A1c MFr Bld: 7.2 % — ABNORMAL HIGH (ref 4.6–6.5)

## 2020-02-17 NOTE — Assessment & Plan Note (Signed)
Chronic No edema Controlled  Continue hctz

## 2020-02-17 NOTE — Assessment & Plan Note (Signed)
Chronic Diet controlled Check a1c Low sugar / carb diet Stressed regular exercise   

## 2020-02-17 NOTE — Assessment & Plan Note (Addendum)
Chronic Check lipid panel  Continue daily statin Gets some cramping in his legs when walking Regular exercise and healthy diet encouraged

## 2020-02-17 NOTE — Assessment & Plan Note (Signed)
Chronic No concerning chest pain, palps and SOB Following with cardiology Continue current medications

## 2020-02-17 NOTE — Assessment & Plan Note (Signed)
Chronic BP well controlled Current regimen effective and well tolerated Continue current medications at current doses cmp  

## 2020-02-17 NOTE — Assessment & Plan Note (Signed)
Chronic GERD controlled Continue daily medication  

## 2020-02-18 NOTE — Telephone Encounter (Signed)
Left message to call back  

## 2020-02-19 NOTE — Telephone Encounter (Signed)
Follow up    Pt is returning call  Call transferred to [facility]

## 2020-02-19 NOTE — Telephone Encounter (Signed)
Spoke with pt and went over recommendations.  Pt already picked up Rosuvastatin and started it.  He will come for labs on 9/7.  Pt appreciative for call.

## 2020-02-26 ENCOUNTER — Other Ambulatory Visit: Payer: Self-pay | Admitting: Cardiology

## 2020-03-02 ENCOUNTER — Other Ambulatory Visit: Payer: Self-pay | Admitting: Interventional Cardiology

## 2020-03-16 ENCOUNTER — Other Ambulatory Visit: Payer: Self-pay | Admitting: Internal Medicine

## 2020-05-03 ENCOUNTER — Other Ambulatory Visit: Payer: Medicare PPO

## 2020-05-03 DIAGNOSIS — Z20822 Contact with and (suspected) exposure to covid-19: Secondary | ICD-10-CM

## 2020-05-04 LAB — SARS-COV-2, NAA 2 DAY TAT

## 2020-05-04 LAB — NOVEL CORONAVIRUS, NAA: SARS-CoV-2, NAA: NOT DETECTED

## 2020-05-06 ENCOUNTER — Telehealth: Payer: Self-pay | Admitting: Internal Medicine

## 2020-05-06 NOTE — Progress Notes (Signed)
  Chronic Care Management   Outreach Note  05/06/2020 Name: Ronnie Boyd MRN: [id number] DOB: 10-Feb-1927  Referred by: Pincus Sanes, MD Reason for referral : No chief complaint on file.   An unsuccessful telephone outreach was attempted today. The patient was referred to the pharmacist for assistance with care management and care coordination.   Follow Up Plan:   Lynnae January Upstream Scheduler

## 2020-05-11 ENCOUNTER — Telehealth: Payer: Self-pay | Admitting: Internal Medicine

## 2020-05-11 NOTE — Progress Notes (Signed)
  Chronic Care Management   Note  05/11/2020 Name: SIRAJ DERMODY MRN: [id number] DOB: September 21, 1926  REINHARDT LICAUSI is a 86 y.o. year old male who is a primary care patient of Burns, Bobette Mo, MD. I reached out to [name] by phone today in response to a referral sent by Mr. Jaysun Wessels [facility] PCP, Pincus Sanes, MD.   Mr. Blais was given information about Chronic Care Management services today including:  1. CCM service includes personalized support from designated clinical staff supervised by his physician, including individualized plan of care and coordination with other care providers 2. 24/7 contact phone numbers for assistance for urgent and routine care needs. 3. Service will only be billed when office clinical staff spend 20 minutes or more in a month to coordinate care. 4. Only one practitioner may furnish and bill the service in a calendar month. 5. The patient may stop CCM services at any time (effective at the end of the month) by phone call to the office staff.   Patient agreed to services and verbal consent obtained.   Follow up plan:   Lynnae January Upstream Scheduler

## 2020-05-17 ENCOUNTER — Other Ambulatory Visit: Payer: Medicare PPO | Admitting: *Deleted

## 2020-05-17 ENCOUNTER — Other Ambulatory Visit: Payer: Self-pay

## 2020-05-17 DIAGNOSIS — E7800 Pure hypercholesterolemia, unspecified: Secondary | ICD-10-CM

## 2020-05-18 LAB — LIPID PANEL
Chol/HDL Ratio: 2.4 ratio (ref 0.0–5.0)
Cholesterol, Total: 195 mg/dL (ref 100–199)
HDL: 81 mg/dL (ref >39)
LDL Chol Calc (NIH): 104 mg/dL — ABNORMAL HIGH (ref 0–99)
Triglycerides: 51 mg/dL (ref 0–149)
VLDL Cholesterol Cal: 10 mg/dL (ref 5–40)

## 2020-05-18 LAB — HEPATIC FUNCTION PANEL
ALT: 12 IU/L (ref 0–44)
AST: 18 IU/L (ref 0–40)
Albumin: 4.2 g/dL (ref 3.5–4.6)
Alkaline Phosphatase: 40 IU/L — ABNORMAL LOW (ref 48–121)
Bilirubin Total: 0.7 mg/dL (ref 0.0–1.2)
Bilirubin, Direct: 0.18 mg/dL (ref 0.00–0.40)
Total Protein: 6.9 g/dL (ref 6.0–8.5)

## 2020-06-01 ENCOUNTER — Ambulatory Visit: Payer: Medicare PPO | Admitting: Adult Health

## 2020-06-01 ENCOUNTER — Ambulatory Visit: Payer: Medicare PPO | Admitting: Family Medicine

## 2020-06-01 ENCOUNTER — Encounter: Payer: Self-pay | Admitting: Adult Health

## 2020-06-10 DIAGNOSIS — G4733 Obstructive sleep apnea (adult) (pediatric): Secondary | ICD-10-CM | POA: Diagnosis not present

## 2020-06-11 ENCOUNTER — Other Ambulatory Visit: Payer: Self-pay | Admitting: Internal Medicine

## 2020-06-13 ENCOUNTER — Encounter: Payer: Self-pay | Admitting: Internal Medicine

## 2020-06-13 ENCOUNTER — Other Ambulatory Visit: Payer: Self-pay

## 2020-06-13 DIAGNOSIS — Z23 Encounter for immunization: Secondary | ICD-10-CM

## 2020-06-16 ENCOUNTER — Ambulatory Visit: Payer: Medicare PPO

## 2020-06-17 ENCOUNTER — Ambulatory Visit: Payer: Medicare PPO | Attending: Internal Medicine

## 2020-06-17 DIAGNOSIS — Z23 Encounter for immunization: Secondary | ICD-10-CM

## 2020-06-17 NOTE — Progress Notes (Signed)
   Covid-19 Vaccination Clinic  Name:  Ronnie Boyd    MRN: [id number] DOB: 17-Mar-1927  06/17/2020  Mr. Ronnie Boyd was observed post Covid-19 immunization for 15 minutes without incident. He was provided with Vaccine Information Sheet and instruction to access the V-Safe system.   Mr. Ronnie Boyd was instructed to call 911 with any severe reactions post vaccine: Marland Kitchen Difficulty breathing  . Swelling of face and throat  . A fast heartbeat  . A bad rash all over body  . Dizziness and weakness

## 2020-07-13 ENCOUNTER — Other Ambulatory Visit: Payer: Self-pay

## 2020-07-13 ENCOUNTER — Ambulatory Visit: Payer: Medicare PPO | Admitting: Pharmacist

## 2020-07-13 DIAGNOSIS — I2510 Atherosclerotic heart disease of native coronary artery without angina pectoris: Secondary | ICD-10-CM

## 2020-07-13 DIAGNOSIS — I10 Essential (primary) hypertension: Secondary | ICD-10-CM

## 2020-07-13 DIAGNOSIS — E7800 Pure hypercholesterolemia, unspecified: Secondary | ICD-10-CM

## 2020-07-13 NOTE — Patient Instructions (Addendum)
Visit Information  Phone number for Pharmacist: [phone]  Thank you for meeting with me to discuss your medications! I look forward to working with you to achieve your health care goals. Below is a summary of what we talked about during the visit:  Goals Addressed            This Visit's Progress   . Pharmacy Care Plan       CARE PLAN ENTRY (see longitudinal plan of care for additional care plan information)  Current Barriers:  . Chronic Disease Management support, education, and care coordination needs related to Hypertension, Hyperlipidemia, and Coronary Artery Disease   Hypertension BP Readings from Last 3 Encounters:  02/17/20 120/68  02/11/20 (!) 142/72  11/24/19 133/60 .  Pharmacist Clinical Goal(s): o Over the next 90 days, patient will work with PharmD and providers to maintain BP goal <140/90 . Current regimen:  o Amlodipine 5 mg daily o Losartan-HCTZ 50-12.5 mg daily o Metoprolol tartrate 25 mg twice a day o Isosorbide MN 60 mg daily . Interventions: o Discussed BP goals and benefits of medications for prevention of heart attack / stroke . Patient self care activities - Over the next 90 days, patient will: o Check BP daily, document, and provide at future appointments o Ensure daily salt intake < 2300 mg/day  Hyperlipidemia / CAD Lab Results  Component Value Date/Time   LDLCALC 104 (H) 05/17/2020 01:31 PM   LDLCALC 74 04/04/2015 10:59 AM   LDLDIRECT 131.5 11/06/2011 10:57 AM .  Pharmacist Clinical Goal(s): o Over the next 90 days, patient will work with PharmD and providers to achieve LDL goal < 70 . Current regimen:  o Rosuvastatin 5 mg MWF o Ranolazine 500 mg twice a day o Isosorbide MN 50 mg daily o Nitroglycerin 0.4 mg SL prn  o Aspirin 81 mg daily o Clopidogrel 75 mg daily o Coenzyme Q10 200 mg daily o Omega-3 Fish oil 1000 mg daily . Interventions: o Discussed cholesterol goals and benefits of medications for prevention of heart attack /  stroke o Discussed cholesterol content in food . Patient self care activities - Over the next 90 days, patient will: o Continue medication as prescribed o Reduce cholesterol in food (try egg whites or Egg Beaters instead of regular eggs)  Medication management . Pharmacist Clinical Goal(s): o Over the next 90 days, patient will work with PharmD and providers to maintain optimal medication adherence . Current pharmacy: [facility] . Interventions o Comprehensive medication review performed. o Continue current medication management strategy . Patient self care activities - Over the next 90 days, patient will: o Focus on medication adherence by pill box o Take medications as prescribed o Report any questions or concerns to PharmD and/or provider(s)  Initial goal documentation      Ronnie Boyd was given information about Chronic Care Management services today including:  1. CCM service includes personalized support from designated clinical staff supervised by his physician, including individualized plan of care and coordination with other care providers 2. 24/7 contact phone numbers for assistance for urgent and routine care needs. 3. Standard insurance, coinsurance, copays and deductibles apply for chronic care management only during months in which we provide at least 20 minutes of these services. Most insurances cover these services at 100%, however patients may be responsible for any copay, coinsurance and/or deductible if applicable. This service may help you avoid the need for more expensive face-to-face services. 4. Only one practitioner may furnish and bill the service in a  calendar month. 5. The patient may stop CCM services at any time (effective at the end of the month) by phone call to the office staff.  Patient agreed to services and verbal consent obtained.   Patient verbalizes understanding of instructions provided today.  Telephone follow up appointment with pharmacy team member  scheduled for: 3 months  Al Corpus, PharmD, BCACP Clinical Pharmacist [facility] [phone]  Cholesterol Content in Foods Cholesterol is a waxy, fat-like substance that helps to carry fat in the blood. The body needs cholesterol in small amounts, but too much cholesterol can cause damage to the arteries and heart. Most people should eat less than 200 milligrams (mg) of cholesterol a day. Foods with cholesterol  Cholesterol is found in animal-based foods, such as meat, seafood, and dairy. Generally, low-fat dairy and lean meats have less cholesterol than full-fat dairy and fatty meats. The milligrams of cholesterol per serving (mg per serving) of common cholesterol-containing foods are listed below. Meat and other proteins  Egg -- one large whole egg has 186 mg.  Veal shank -- 4 oz has 141 mg.  Lean ground [place] (93% lean) -- 4 oz has 118 mg.  Fat-trimmed lamb loin -- 4 oz has 106 mg.  Lean ground beef (90% lean) -- 4 oz has 100 mg.  Lobster -- 3.5 oz has 90 mg.  Pork loin chops -- 4 oz has 86 mg.  Canned salmon -- 3.5 oz has 83 mg.  Fat-trimmed beef top loin -- 4 oz has 78 mg.  Frankfurter -- 1 frank (3.5 oz) has 77 mg.  Crab -- 3.5 oz has 71 mg.  Roasted chicken without skin, white meat -- 4 oz has 66 mg.  Light bologna -- 2 oz has 45 mg.  Deli-cut [place] -- 2 oz has 31 mg.  Canned tuna -- 3.5 oz has 31 mg.  Tomasa Blase -- 1 oz has 29 mg.  Oysters and mussels (raw) -- 3.5 oz has 25 mg.  Mackerel -- 1 oz has 22 mg.  Trout -- 1 oz has 20 mg.  Pork sausage -- 1 link (1 oz) has 17 mg.  Salmon -- 1 oz has 16 mg.  Tilapia -- 1 oz has 14 mg. Dairy  Soft-serve ice cream --  cup (4 oz) has 103 mg.  Whole-milk yogurt -- 1 cup (8 oz) has 29 mg.  Cheddar cheese -- 1 oz has 28 mg.  American cheese -- 1 oz has 28 mg.  Whole milk -- 1 cup (8 oz) has 23 mg.  2% milk -- 1 cup (8 oz) has 18 mg.  Cream cheese -- 1 tablespoon (Tbsp)  has 15 mg.  Cottage cheese --  cup (4 oz) has 14 mg.  Low-fat (1%) milk -- 1 cup (8 oz) has 10 mg.  Sour cream -- 1 Tbsp has 8.5 mg.  Low-fat yogurt -- 1 cup (8 oz) has 8 mg.  Nonfat Greek yogurt -- 1 cup (8 oz) has 7 mg.  Half-and-half cream -- 1 Tbsp has 5 mg. Fats and oils  Cod liver oil -- 1 tablespoon (Tbsp) has 82 mg.  Butter -- 1 Tbsp has 15 mg.  Lard -- 1 Tbsp has 14 mg.  Bacon grease -- 1 Tbsp has 14 mg.  Mayonnaise -- 1 Tbsp has 5-10 mg.  Margarine -- 1 Tbsp has 3-10 mg. Exact amounts of cholesterol in these foods may vary depending on specific ingredients and brands. Foods without cholesterol Most plant-based foods  do not have cholesterol unless you combine them with a food that has cholesterol. Foods without cholesterol include:  Grains and cereals.  Vegetables.  Fruits.  Vegetable oils, such as olive, canola, and sunflower oil.  Legumes, such as peas, beans, and lentils.  Nuts and seeds.  Egg whites. Summary  The body needs cholesterol in small amounts, but too much cholesterol can cause damage to the arteries and heart.  Most people should eat less than 200 milligrams (mg) of cholesterol a day. This information is not intended to replace advice given to you by your health care provider. Make sure you discuss any questions you have with your health care provider. Document Revised: 08/09/2017 Document Reviewed: 04/23/2017 Elsevier Patient Education  2020 [employer].

## 2020-07-13 NOTE — Chronic Care Management (AMB) (Signed)
Chronic Care Management Pharmacy  Name: Ronnie Boyd  MRN: [id number] DOB: 12/14/1926   Chief Complaint/ HPI  Benjie Karvonen,  86 y.o. , male presents for their Initial CCM visit with the clinical pharmacist via telephone due to COVID-19 Pandemic.  PCP : Binnie Rail, MD Patient Care Team: Binnie Rail, MD as PCP - General (Internal Medicine) Larey Dresser, MD (Cardiology) Franchot Gallo, MD (Urology) Dohmeier, Asencion Partridge, MD (Neurology) Garvin Fila, MD (Neurology) Charlton Haws, [facility] as Pharmacist (Pharmacist)  Their chronic conditions include: Hypertension, Hyperlipidemia, Diabetes, Coronary Artery Disease, GERD, BPH and Allergic Rhinitis, PVD, Hx Stroke, Hx DVT, Degenerative joint disease  Lived here since the 60s, lives with his wife.   Office Visits: 02/17/20 Dr Quay Burow OV: chronic f/u, GFR slightly decreased, other labs stable, no med changes. Work on Diet to improve A1c  Consult Visit: 02/11/20 Dr Tamala Julian (cardiology): f/u for aortic stenosis, CAD. Pt declined advanced testing for aortic stenosis. Changed Livalo to rosuva 5 MWF due to cost.  11/24/19 NP Ward Givens (neurology): f/u for OSA, pt had stopped CPAP due to nasal congestion. Will restart CPAP, refit mask.  Allergies  Allergen Reactions  . Crestor [Rosuvastatin Calcium]     Muscle cramping  . Lipitor [Atorvastatin]     Leg cramping  . Other     Environmental   . Pravastatin     Leg cramping   Medications: Outpatient Encounter Medications as of 07/13/2020  Medication Sig  . albuterol (PROVENTIL HFA;VENTOLIN HFA) 108 (90 Base) MCG/ACT inhaler Inhale 2 puffs into the lungs every 6 (six) hours as needed for wheezing or shortness of breath.  Marland Kitchen amLODipine (NORVASC) 5 MG tablet TAKE 1 TABLET BY MOUTH EVERY DAY  . aspirin 81 MG chewable tablet Chew by mouth daily.  . clopidogrel (PLAVIX) 75 MG tablet TAKE 1 TABLET BY MOUTH EVERY DAY  . Coenzyme Q10 200 MG capsule Take 200 mg by mouth daily.   .  fluticasone (FLONASE) 50 MCG/ACT nasal spray Place 2 sprays into both nostrils daily.  . Garlic Oil 2863 MG CAPS Take 1 capsule by mouth daily.    . isosorbide mononitrate (IMDUR) 60 MG 24 hr tablet TAKE 1 TABLET BY MOUTH EVERY DAY  . loratadine (CLARITIN) 10 MG tablet Take 10 mg by mouth daily as needed for rhinitis.   Marland Kitchen losartan-hydrochlorothiazide (HYZAAR) 50-12.5 MG tablet TAKE 1 TABLET BY MOUTH EVERY DAY  . metoprolol tartrate (LOPRESSOR) 25 MG tablet TAKE 1 TABLET BY MOUTH TWICE A DAY  . Multiple Vitamins-Minerals (CENTRUM SILVER) tablet Take 1 tablet by mouth daily.    . nitroGLYCERIN (NITROSTAT) 0.4 MG SL tablet PLACE 1 TABLET (0.4 MG TOTAL) UNDER THE TONGUE EVERY 5 (FIVE) MINUTES AS NEEDED FOR CHEST PAIN.  Marland Kitchen Omega-3 Fatty Acids (FISH OIL) 1000 MG CAPS Take 1 capsule by mouth daily.    . pantoprazole (PROTONIX) 40 MG tablet TAKE 1 TABLET (40 MG TOTAL) BY MOUTH DAILY.  . ranolazine (RANEXA) 500 MG 12 hr tablet Take 1 tablet (500 mg total) by mouth 2 (two) times daily.  . rosuvastatin (CRESTOR) 5 MG tablet Take 1 tablet (5 mg total) by mouth every Monday, Wednesday, and Friday.  . tamsulosin (FLOMAX) 0.4 MG CAPS capsule Take 0.4 mg by mouth daily.   No facility-administered encounter medications on file as of 07/13/2020.    Wt Readings from Last 3 Encounters:  02/17/20 174 lb 12.8 oz (79.3 kg)  02/11/20 177 lb 1.9 oz (80.3 kg)  11/24/19 180 lb (81.6 kg)    Current Diagnosis/Assessment:    Goals Addressed   None     Hypertension   BP goal is:  <130/80  Office blood pressures are  BP Readings from Last 3 Encounters:  02/17/20 120/68  02/11/20 (!) 142/72  11/24/19 133/60   Pulse Readings from Last 3 Encounters:  02/17/20 87  02/11/20 66  11/24/19 73   Lab Results  Component Value Date   CREATININE 1.45 02/17/2020   BUN 21 02/17/2020   GFR 55.25 (L) 02/17/2020   GFRNONAA 44 (L) 10/29/2016   GFRAA 51 (L) 10/29/2016   NA 140 02/17/2020   K 3.7 02/17/2020    CALCIUM 9.2 02/17/2020   CO2 28 02/17/2020   Patient checks BP at home several times per month Patient home BP readings are ranging: 120/60-140/90  Patient has failed these meds in the past: n/a Patient is currently controlled on the following medications:  . Amlodipine 5 mg daily . Losartan-HCTZ 50-12.5 mg daily . Metoprolol tartrate 25 mg BID . Isosorbide MN 60 mg daily  We discussed BP goals; benefits of medications; pt denies issues/side effects with meds  Plan  Continue current medications     Hyperlipidemia   LDL goal < 70 Hx CAD s/p 3 stents PVD, hx stroke  Last lipids Lab Results  Component Value Date   CHOL 195 05/17/2020   HDL 81 05/17/2020   LDLCALC 104 (H) 05/17/2020   LDLDIRECT 131.5 11/06/2011   TRIG 51 05/17/2020   CHOLHDL 2.4 05/17/2020   Hepatic Function Latest Ref Rng & Units 05/17/2020 02/17/2020 08/14/2019  Total Protein 6.0 - 8.5 g/dL 6.9 7.1 7.2  Albumin 3.5 - 4.6 g/dL 4.2 4.1 4.1  AST 0 - 40 IU/L _0 ALT 0 - 44 IU/L _1 Alk Phosphatase 48 - 121 IU/L 40(L) 36(L) 39  Total Bilirubin 0.0 - 1.2 mg/dL 0.7 0.6 0.6  Bilirubin, Direct 0.00 - 0.40 mg/dL 0.18 - -    The ASCVD Risk score Mikey Bussing DC Jr., et al., 2013) failed to calculate for the following reasons:   The 2013 ASCVD risk score is only valid for ages 14 to 5   Patient has failed these meds in past: atorvastatin, pravastatin Patient is currently controlled on the following medications:  . Rosuvastatin 5 mg MWF . Ranolazine 500 mg BID . Isosorbide MN 50 mg daily . Nitroglycerin 0.4 mg SL prn  . Aspirin 81 mg daily . Clopidogrel 75 mg daily . Coenzyme Q10 200 mg daily . Omega-3 Fish oil 1000 mg daily  We discussed:  diet and exercise extensively; Cholesterol goals; benefits of statin for ASCVD risk reduction; importance of clopidogrel/aspirin to maintain patent stents  Plan  Continue current medications  Diabetes   A1c goal <8%  Recent Relevant Labs: Lab Results    Component Value Date/Time   HGBA1C 7.2 (H) 02/17/2020 12:55 PM   HGBA1C 6.4 08/14/2019 11:50 AM   GFR 55.25 (L) 02/17/2020 12:55 PM   GFR 60.07 08/14/2019 11:50 AM    Last diabetic Eye exam:  Lab Results  Component Value Date/Time   HMDIABEYEEXA No Retinopathy 09/17/2017 12:00 AM    Last diabetic Foot exam: No results found for: HMDIABFOOTEX   Patient has failed these meds in past: n/a Patient is currently controlled on the following medications: . No medication  We discussed: diet and exercise extensively  Plan  Continue control with diet and exercise  GERD   Patient has  failed these meds in past: n/a Patient is currently controlled on the following medications:  . Pantoprazole 40 mg daily  We discussed: Pt denies issues with reflux since starting PPI  Plan  Continue current medications  Allergic rhinitis   Patient has failed these meds in past: n/a Patient is currently controlled on the following medications:  . Fluticasone nasal spray PRN . Loratadine 10 mg daily PRN . Albuterol HFA prn  We discussed:  Patient is satisfied with current regimen and denies issues  Plan  Continue current medications  BPH   Per Dr Diona Fanti  PSA  Date Value Ref Range Status  11/23/2010 0.76 0.10 - 4.00 ng/mL Final  11/15/2009 0.69 0.10 - 4.00 ng/mL Final  11/29/2008 0.66 0.10 - 4.00 ng/mL Final    Patient has failed these meds in past: n/a Patient is currently controlled on the following medications:  . tamsulosin 0.4 mg daily   We discussed:  Indication for medication; Patient is satisfied with current regimen and denies issues  Plan  Continue current medications  Health Maintenance   Patient is currently controlled on the following medications:  . Garlic oil 2876 mg daily . Multivitamin  We discussed:  Patient is satisfied with current regimen and denies issues  Plan  Continue current medications  Medication Management   Pt uses [facility] for  all medications Uses pill box? Yes Pt endorses 99% compliance  We discussed: Discussed benefits of medication synchronization, packaging and delivery as well as enhanced pharmacist oversight with [facility].Pt will let me know if he wants to switch  Plan  Continue current medication management strategy    Follow up: 3 month phone visit  Charlene Brooke, PharmD, BCACP Clinical Pharmacist [facility] [phone]

## 2020-07-19 ENCOUNTER — Other Ambulatory Visit: Payer: Self-pay

## 2020-07-19 DIAGNOSIS — I10 Essential (primary) hypertension: Secondary | ICD-10-CM

## 2020-07-20 DIAGNOSIS — J3089 Other allergic rhinitis: Secondary | ICD-10-CM | POA: Diagnosis not present

## 2020-07-20 DIAGNOSIS — H1045 Other chronic allergic conjunctivitis: Secondary | ICD-10-CM | POA: Diagnosis not present

## 2020-07-21 ENCOUNTER — Ambulatory Visit: Payer: Medicare PPO | Admitting: Neurology

## 2020-07-26 ENCOUNTER — Ambulatory Visit: Payer: Medicare PPO | Admitting: Adult Health

## 2020-07-27 ENCOUNTER — Other Ambulatory Visit: Payer: Self-pay | Admitting: Interventional Cardiology

## 2020-07-27 ENCOUNTER — Other Ambulatory Visit: Payer: Self-pay | Admitting: Internal Medicine

## 2020-08-18 ENCOUNTER — Ambulatory Visit: Payer: Medicare PPO | Admitting: Internal Medicine

## 2020-08-18 NOTE — Patient Instructions (Addendum)
    Blood work was ordered.      Medications changes include :   none     Please followup in 6 months  

## 2020-08-18 NOTE — Progress Notes (Signed)
Subjective:    Patient ID: Ronnie Boyd, male    DOB: 1926-11-15, 86 y.o.   MRN: [id number]  HPI The patient is here for follow up of their chronic medical problems, including DM, CAD, htn, leg edema, h/o CVA, hyperlipidemia, GERD, OSA on cpap  He is taking all of his medications as prescribed.    He is exercising some - walking.   Leg cramping - taking crestor 3 times a week. He takes co q10.  He was wondering if the leg cramping is related to Crestor.  The leg cramping occurs only when he walks.  He does have known PAD.  He states the pain goes away when he stops.  He states he knows he should be walking more.  He is taking the aspirin daily.    Medications and allergies reviewed with patient and updated if appropriate.  Patient Active Problem List   Diagnosis Date Noted  . History of DVT of lower extremity 06/12/2017  . Bilateral leg edema 05/08/2017  . Coronary artery disease involving native coronary artery   . Diabetes (HCC) 11/09/2015  . OSA on CPAP 07/20/2015  . History of stroke, lacunar 2016 03/24/2015  . Aortic stenosis, severe  01/08/2011  . HYPERCHOLESTEROLEMIA 12/01/2007  . Essential hypertension 12/01/2007  . Peripheral vascular disease (HCC) 12/01/2007  . HEMORRHOIDS 12/01/2007  . Allergic rhinitis 12/01/2007  . GERD 12/01/2007  . DEGENERATIVE JOINT DISEASE 12/01/2007    Current Outpatient Medications on File Prior to Visit  Medication Sig Dispense Refill  . albuterol (PROVENTIL HFA;VENTOLIN HFA) 108 (90 Base) MCG/ACT inhaler Inhale 2 puffs into the lungs every 6 (six) hours as needed for wheezing or shortness of breath. 1 Inhaler 0  . amLODipine (NORVASC) 5 MG tablet TAKE 1 TABLET BY MOUTH EVERY DAY 90 tablet 0  . aspirin 81 MG chewable tablet Chew by mouth daily.    . clopidogrel (PLAVIX) 75 MG tablet TAKE 1 TABLET BY MOUTH EVERY DAY 90 tablet 3  . Coenzyme Q10 200 MG capsule Take 200 mg by mouth daily.     . fluticasone (FLONASE) 50 MCG/ACT nasal  spray Place 2 sprays into both nostrils daily. 16 g 0  . Garlic Oil 1000 MG CAPS Take 1 capsule by mouth daily.    . isosorbide mononitrate (IMDUR) 60 MG 24 hr tablet TAKE 1 TABLET BY MOUTH EVERY DAY 90 tablet 2  . loratadine (CLARITIN) 10 MG tablet Take 10 mg by mouth daily as needed for rhinitis.     Marland Kitchen losartan-hydrochlorothiazide (HYZAAR) 50-12.5 MG tablet TAKE 1 TABLET BY MOUTH EVERY DAY 90 tablet 0  . metoprolol tartrate (LOPRESSOR) 25 MG tablet TAKE 1 TABLET BY MOUTH TWICE A DAY 180 tablet 2  . Multiple Vitamins-Minerals (CENTRUM SILVER) tablet Take 1 tablet by mouth daily.    . nitroGLYCERIN (NITROSTAT) 0.4 MG SL tablet PLACE 1 TABLET (0.4 MG TOTAL) UNDER THE TONGUE EVERY 5 (FIVE) MINUTES AS NEEDED FOR CHEST PAIN. 25 tablet 3  . Omega-3 Fatty Acids (FISH OIL) 1000 MG CAPS Take 1 capsule by mouth daily.    . pantoprazole (PROTONIX) 40 MG tablet TAKE 1 TABLET (40 MG TOTAL) BY MOUTH DAILY. 30 tablet 11  . ranolazine (RANEXA) 500 MG 12 hr tablet Take 1 tablet (500 mg total) by mouth 2 (two) times daily. 180 tablet 3  . tamsulosin (FLOMAX) 0.4 MG CAPS capsule Take 0.4 mg by mouth daily.    . rosuvastatin (CRESTOR) 5 MG tablet Take 1 tablet (5  mg total) by mouth every Monday, Wednesday, and Friday. (Patient not taking: Reported on 08/19/2020) 45 tablet 3   No current facility-administered medications on file prior to visit.    Past Medical History:  Diagnosis Date  . Allergic rhinitis   . Aortic stenosis    mild echo 7/14  . Borderline type 2 diabetes mellitus   . Coronary artery disease   . Degenerative joint disease   . Family history of adverse reaction to anesthesia    "daughter had problems when she was a teen; none since; it was related to asthma"  . GERD (gastroesophageal reflux disease)   . Hemorrhoids   . History of hiatal hernia    "dx'd years ago" (06/01/2015)  . Hypercholesteremia   . Hypertension   . OSA on CPAP since 05/23/2015  . Prostatitis    hx of  . PVD  (peripheral vascular disease) (HCC)   . Stroke [facility])     Past Surgical History:  Procedure Laterality Date  . CARDIAC CATHETERIZATION [name] 03/18/2015   Procedure: Left Heart Cath and Coronary Angiography;  Surgeon: Laurey Morale, MD;  Location: [facility] INVASIVE CV LAB;  Service: Cardiovascular;  Laterality: [name];  . CARDIAC CATHETERIZATION [name] 06/01/2015   Procedure: Coronary Stent Intervention;  Surgeon: Iran Ouch, MD;  Location: [facility] INVASIVE CV LAB;  Service: Cardiovascular;  Laterality: [name];  . CARDIAC CATHETERIZATION  11/11/2015  . CARDIAC CATHETERIZATION [name] 11/11/2015   Procedure: Intravascular Pressure Wire/FFR Study;  Surgeon: Tonny Bollman, MD;  Location: [facility] INVASIVE CV LAB;  Service: Cardiovascular;  Laterality: [name];  . CARPAL TUNNEL RELEASE Bilateral 2006-2007  . CATARACT EXTRACTION W/ INTRAOCULAR LENS  IMPLANT, BILATERAL Bilateral ~ 2003  . EXCISIONAL HEMORRHOIDECTOMY  1989    Social History   Socioeconomic History  . Marital status: Married    Spouse name: Not on file  . Number of children: Not on file  . Years of education: Not on file  . Highest education level: Not on file  Occupational History  . Occupation: retired  Tobacco Use  . Smoking status: Never Smoker  . Smokeless tobacco: Never Used  Vaping Use  . Vaping Use: Never used  Substance and Sexual Activity  . Alcohol use: No  . Drug use: No  . Sexual activity: Not Currently  Other Topics Concern  . Not on file  Social History Narrative   Daughter is [name] Psychologist, sport and exercise at [employer])   Social Determinants of Corporate investment banker Strain: Not on file  Food Insecurity: Not on file  Transportation Needs: Not on file  Physical Activity: Not on file  Stress: Not on file  Social Connections: Not on file    Family History  Problem Relation Age of Onset  . Hypertension Mother   . Diabetes Mother 36  . Stroke Father 64  . Arthritis Other   . Diabetes Brother     Review of  Systems  Constitutional: Negative for chills and fever.  Respiratory: Positive for shortness of breath (with walking). Negative for cough and wheezing.   Cardiovascular: Negative for chest pain, palpitations and leg swelling.  Gastrointestinal: Negative for nausea.       Gerd controlled  Neurological: Negative for light-headedness and headaches.       Objective:   Vitals:   08/19/20 1457  BP: 120/70  Pulse: 72  Temp: 98.4 F (36.9 C)  SpO2: 98%   BP Readings from Last 3 Encounters:  08/19/20 120/70  08/19/20 120/70  02/17/20 120/68   Wt Readings from Last 3 Encounters:  08/19/20 178 lb (80.7 kg)  08/19/20 178 lb 12.8 oz (81.1 kg)  02/17/20 174 lb 12.8 oz (79.3 kg)   Body mass index is 29.62 kg/m.   Physical Exam    Constitutional: Appears well-developed and well-nourished. No distress.  HENT:  Head: Normocephalic and atraumatic.  Neck: Neck supple. No tracheal deviation present. No thyromegaly present.  No cervical lymphadenopathy Cardiovascular: Normal rate, regular rhythm and normal heart sounds.   3/6 sys murmur heard. No carotid bruit .  No edema Pulmonary/Chest: Effort normal and breath sounds normal. No respiratory distress. No has no wheezes. No rales.  Skin: Skin is warm and dry. Not diaphoretic.  Psychiatric: Normal mood and affect. Behavior is normal.      Assessment & Plan:    See Problem List for Assessment and Plan of chronic medical problems.    This visit occurred during the SARS-CoV-2 public health emergency.  Safety protocols were in place, including screening questions prior to the visit, additional usage of staff PPE, and extensive cleaning of exam room while observing appropriate contact time as indicated for disinfecting solutions.

## 2020-08-19 ENCOUNTER — Other Ambulatory Visit: Payer: Self-pay

## 2020-08-19 ENCOUNTER — Encounter: Payer: Self-pay | Admitting: Internal Medicine

## 2020-08-19 ENCOUNTER — Ambulatory Visit (INDEPENDENT_AMBULATORY_CARE_PROVIDER_SITE_OTHER): Payer: Medicare PPO | Admitting: Internal Medicine

## 2020-08-19 ENCOUNTER — Ambulatory Visit (INDEPENDENT_AMBULATORY_CARE_PROVIDER_SITE_OTHER): Payer: Medicare PPO

## 2020-08-19 VITALS — BP 120/70 | HR 72 | Temp 98.4°F | Ht 65.0 in | Wt 178.0 lb

## 2020-08-19 VITALS — BP 120/70 | HR 72 | Temp 98.4°F | Ht 65.0 in | Wt 178.8 lb

## 2020-08-19 DIAGNOSIS — E7800 Pure hypercholesterolemia, unspecified: Secondary | ICD-10-CM | POA: Diagnosis not present

## 2020-08-19 DIAGNOSIS — I10 Essential (primary) hypertension: Secondary | ICD-10-CM | POA: Diagnosis not present

## 2020-08-19 DIAGNOSIS — K219 Gastro-esophageal reflux disease without esophagitis: Secondary | ICD-10-CM | POA: Diagnosis not present

## 2020-08-19 DIAGNOSIS — G4733 Obstructive sleep apnea (adult) (pediatric): Secondary | ICD-10-CM

## 2020-08-19 DIAGNOSIS — Z0000 Encounter for general adult medical examination without abnormal findings: Secondary | ICD-10-CM

## 2020-08-19 DIAGNOSIS — E119 Type 2 diabetes mellitus without complications: Secondary | ICD-10-CM

## 2020-08-19 DIAGNOSIS — I2510 Atherosclerotic heart disease of native coronary artery without angina pectoris: Secondary | ICD-10-CM | POA: Diagnosis not present

## 2020-08-19 DIAGNOSIS — Z9989 Dependence on other enabling machines and devices: Secondary | ICD-10-CM | POA: Diagnosis not present

## 2020-08-19 DIAGNOSIS — I739 Peripheral vascular disease, unspecified: Secondary | ICD-10-CM | POA: Diagnosis not present

## 2020-08-19 DIAGNOSIS — R600 Localized edema: Secondary | ICD-10-CM | POA: Diagnosis not present

## 2020-08-19 LAB — CBC WITH DIFFERENTIAL/PLATELET
Basophils Absolute: 0 10*3/uL (ref 0.0–0.1)
Basophils Relative: 0.9 % (ref 0.0–3.0)
Eosinophils Absolute: 0.2 10*3/uL (ref 0.0–0.7)
Eosinophils Relative: 4.2 % (ref 0.0–5.0)
HCT: 44.1 % (ref 39.0–52.0)
Hemoglobin: 14.1 g/dL (ref 13.0–17.0)
Lymphocytes Relative: 21.4 % (ref 12.0–46.0)
Lymphs Abs: 1.1 10*3/uL (ref 0.7–4.0)
MCHC: 31.9 g/dL (ref 30.0–36.0)
MCV: 83.3 fl (ref 78.0–100.0)
Monocytes Absolute: 0.7 10*3/uL (ref 0.1–1.0)
Monocytes Relative: 14.3 % — ABNORMAL HIGH (ref 3.0–12.0)
Neutro Abs: 3 10*3/uL (ref 1.4–7.7)
Neutrophils Relative %: 59.2 % (ref 43.0–77.0)
Platelets: 152 10*3/uL (ref 150.0–400.0)
RBC: 5.29 Mil/uL (ref 4.22–5.81)
RDW: 15 % (ref 11.5–15.5)
WBC: 5.1 10*3/uL (ref 4.0–10.5)

## 2020-08-19 LAB — HEMOGLOBIN A1C: Hgb A1c MFr Bld: 6.4 % (ref 4.6–6.5)

## 2020-08-19 LAB — BASIC METABOLIC PANEL
BUN: 21 mg/dL (ref 6–23)
CO2: 30 mEq/L (ref 19–32)
Calcium: 9.9 mg/dL (ref 8.4–10.5)
Chloride: 101 mEq/L (ref 96–112)
Creatinine, Ser: 1.43 mg/dL (ref 0.40–1.50)
GFR: 42.98 mL/min — ABNORMAL LOW (ref >60.00)
Glucose, Bld: 88 mg/dL (ref 70–99)
Potassium: 4.6 mEq/L (ref 3.5–5.1)
Sodium: 137 mEq/L (ref 135–145)

## 2020-08-19 NOTE — Assessment & Plan Note (Signed)
Chronic Well controlled No edema Continue hydrochlorothiazide 12.5 mg daily

## 2020-08-19 NOTE — Assessment & Plan Note (Signed)
Chronic Does have shortness of breath with exertion Following with cardiology On Ranexa, metoprolol, Crestor, Plavix, aspirin 81 mg and Imdur

## 2020-08-19 NOTE — Assessment & Plan Note (Signed)
Chronic Controlled Continue pantoprazole daily

## 2020-08-19 NOTE — Assessment & Plan Note (Signed)
Chronic The cramping in his legs with walking is likely related to his PAD Continue Crestor 5 mg 3 times daily, aspirin 81 mg daily Encouraged him to increase his walking Advised him to discuss this with cardiology

## 2020-08-19 NOTE — Assessment & Plan Note (Addendum)
Chronic BP well controlled Continue amlodipine 5 mg daily, Imdur 60 mg daily, losartan-hydrochlorothiazide 50-12.5 mg daily, metoprolol 25 mg twice daily BMP

## 2020-08-19 NOTE — Patient Instructions (Signed)
Ronnie Boyd , Thank you for taking time to come for your Medicare Wellness Visit. I appreciate your ongoing commitment to your health goals. Please review the following plan we discussed and let me know if I can assist you in the future.   Screening recommendations/referrals: Colonoscopy: no repeat due to age Recommended yearly ophthalmology/optometry visit for glaucoma screening and checkup Recommended yearly dental visit for hygiene and checkup  Vaccinations: Influenza vaccine: 06/13/2020 Pneumococcal vaccine: up to date Tdap vaccine: 12/12/2014; due every 10 years Shingles vaccine: not a candidate for zoster vaccine    Covid-19: up to date  Advanced directives: Please bring a copy of your health care power of attorney and living will to the office at your convenience.  Conditions/risks identified: Yes; Reviewed health maintenance screenings with patient today and relevant education, vaccines, and/or referrals were provided. Please continue to do your personal lifestyle choices by: daily care of teeth and gums, regular physical activity (goal should be 5 days a week for 30 minutes), eat a healthy diet, avoid tobacco and drug use, limiting any alcohol intake, taking a low-dose aspirin (if not allergic or have been advised by your provider otherwise) and taking vitamins and minerals as recommended by your provider. Continue doing brain stimulating activities (puzzles, reading, adult coloring books, staying active) to keep memory sharp. Continue to eat heart healthy diet (full of fruits, vegetables, whole grains, lean protein, water--limit salt, fat, and sugar intake) and increase physical activity as tolerated.  Next appointment: Please schedule your next Medicare Wellness Visit with your Nurse Health Advisor in 1 year by calling [phone].  Preventive Care 2 Years and Older, Male Preventive care refers to lifestyle choices and visits with your health care provider that can promote health and  wellness. What does preventive care include?  A yearly physical exam. This is also called an annual well check.  Dental exams once or twice a year.  Routine eye exams. Ask your health care provider how often you should have your eyes checked.  Personal lifestyle choices, including:  Daily care of your teeth and gums.  Regular physical activity.  Eating a healthy diet.  Avoiding tobacco and drug use.  Limiting alcohol use.  Practicing safe sex.  Taking low doses of aspirin every day.  Taking vitamin and mineral supplements as recommended by your health care provider. What happens during an annual well check? The services and screenings done by your health care provider during your annual well check will depend on your age, overall health, lifestyle risk factors, and family history of disease. Counseling  Your health care provider may ask you questions about your:  Alcohol use.  Tobacco use.  Drug use.  Emotional well-being.  Home and relationship well-being.  Sexual activity.  Eating habits.  History of falls.  Memory and ability to understand (cognition).  Work and work Astronomer. Screening  You may have the following tests or measurements:  Height, weight, and BMI.  Blood pressure.  Lipid and cholesterol levels. These may be checked every 5 years, or more frequently if you are over 61 years old.  Skin check.  Lung cancer screening. You may have this screening every year starting at age 23 if you have a 30-pack-year history of smoking and currently smoke or have quit within the past 15 years.  Fecal occult blood test (FOBT) of the stool. You may have this test every year starting at age 10.  Flexible sigmoidoscopy or colonoscopy. You may have a sigmoidoscopy every 5 years or  a colonoscopy every 10 years starting at age 73.  Prostate cancer screening. Recommendations will vary depending on your family history and other risks.  Hepatitis C blood  test.  Hepatitis B blood test.  Sexually transmitted disease (STD) testing.  Diabetes screening. This is done by checking your blood sugar (glucose) after you have not eaten for a while (fasting). You may have this done every 1-3 years.  Abdominal aortic aneurysm (AAA) screening. You may need this if you are a current or former smoker.  Osteoporosis. You may be screened starting at age 3 if you are at high risk. Talk with your health care provider about your test results, treatment options, and if necessary, the need for more tests. Vaccines  Your health care provider may recommend certain vaccines, such as:  Influenza vaccine. This is recommended every year.  Tetanus, diphtheria, and acellular pertussis (Tdap, Td) vaccine. You may need a Td booster every 10 years.  Zoster vaccine. You may need this after age 22.  Pneumococcal 13-valent conjugate (PCV13) vaccine. One dose is recommended after age 65.  Pneumococcal polysaccharide (PPSV23) vaccine. One dose is recommended after age 67. Talk to your health care provider about which screenings and vaccines you need and how often you need them. This information is not intended to replace advice given to you by your health care provider. Make sure you discuss any questions you have with your health care provider. Document Released: 09/23/2015 Document Revised: 05/16/2016 Document Reviewed: 06/28/2015 Elsevier Interactive Patient Education  2017 [employer].  Fall Prevention in the Home Falls can cause injuries. They can happen to people of all ages. There are many things you can do to make your home safe and to help prevent falls. What can I do on the outside of my home?  Regularly fix the edges of walkways and driveways and fix any cracks.  Remove anything that might make you trip as you walk through a door, such as a raised step or threshold.  Trim any bushes or trees on the path to your home.  Use bright outdoor  lighting.  Clear any walking paths of anything that might make someone trip, such as rocks or tools.  Regularly check to see if handrails are loose or broken. Make sure that both sides of any steps have handrails.  Any raised decks and porches should have guardrails on the edges.  Have any leaves, snow, or ice cleared regularly.  Use sand or salt on walking paths during winter.  Clean up any spills in your garage right away. This includes oil or grease spills. What can I do in the bathroom?  Use night lights.  Install grab bars by the toilet and in the tub and shower. Do not use towel bars as grab bars.  Use non-skid mats or decals in the tub or shower.  If you need to sit down in the shower, use a plastic, non-slip stool.  Keep the floor dry. Clean up any water that spills on the floor as soon as it happens.  Remove soap buildup in the tub or shower regularly.  Attach bath mats securely with double-sided non-slip rug tape.  Do not have throw rugs and other things on the floor that can make you trip. What can I do in the bedroom?  Use night lights.  Make sure that you have a light by your bed that is easy to reach.  Do not use any sheets or blankets that are too big for your bed.  They should not hang down onto the floor.  Have a firm chair that has side arms. You can use this for support while you get dressed.  Do not have throw rugs and other things on the floor that can make you trip. What can I do in the kitchen?  Clean up any spills right away.  Avoid walking on wet floors.  Keep items that you use a lot in easy-to-reach places.  If you need to reach something above you, use a strong step stool that has a grab bar.  Keep electrical cords out of the way.  Do not use floor polish or wax that makes floors slippery. If you must use wax, use non-skid floor wax.  Do not have throw rugs and other things on the floor that can make you trip. What can I do with my  stairs?  Do not leave any items on the stairs.  Make sure that there are handrails on both sides of the stairs and use them. Fix handrails that are broken or loose. Make sure that handrails are as long as the stairways.  Check any carpeting to make sure that it is firmly attached to the stairs. Fix any carpet that is loose or worn.  Avoid having throw rugs at the top or bottom of the stairs. If you do have throw rugs, attach them to the floor with carpet tape.  Make sure that you have a light switch at the top of the stairs and the bottom of the stairs. If you do not have them, ask someone to add them for you. What else can I do to help prevent falls?  Wear shoes that:  Do not have high heels.  Have rubber bottoms.  Are comfortable and fit you well.  Are closed at the toe. Do not wear sandals.  If you use a stepladder:  Make sure that it is fully opened. Do not climb a closed stepladder.  Make sure that both sides of the stepladder are locked into place.  Ask someone to hold it for you, if possible.  Clearly mark and make sure that you can see:  Any grab bars or handrails.  First and last steps.  Where the edge of each step is.  Use tools that help you move around (mobility aids) if they are needed. These include:  Canes.  Walkers.  Scooters.  Crutches.  Turn on the lights when you go into a dark area. Replace any light bulbs as soon as they burn out.  Set up your furniture so you have a clear path. Avoid moving your furniture around.  If any of your floors are uneven, fix them.  If there are any pets around you, be aware of where they are.  Review your medicines with your doctor. Some medicines can make you feel dizzy. This can increase your chance of falling. Ask your doctor what other things that you can do to help prevent falls. This information is not intended to replace advice given to you by your health care provider. Make sure you discuss any  questions you have with your health care provider. Document Released: 06/23/2009 Document Revised: 02/02/2016 Document Reviewed: 10/01/2014 Elsevier Interactive Patient Education  2017 [employer].

## 2020-08-19 NOTE — Assessment & Plan Note (Signed)
Chronic Continue Crestor 5 mg 3 times daily-I believe he is tolerating this Explained to him that his cramping while walking is likely related to his PAD and not the statin Advised him to discuss with cardiology Continue co-Q10

## 2020-08-19 NOTE — Progress Notes (Signed)
Subjective:   Ronnie Boyd is a 86 y.o. male who presents for Medicare Annual/Subsequent preventive examination.  Review of Systems    No ROS. Medicare Wellness Visit. Additional risk factors are reflected in social history. Cardiac Risk Factors include: advanced age (>36men, >83 women);dyslipidemia;family history of premature cardiovascular disease;hypertension;male gender     Objective:    Today's Vitals   08/19/20 1425  BP: 120/70  Pulse: 72  Temp: 98.4 F (36.9 C)  Weight: 178 lb 12.8 oz (81.1 kg)  Height:  (1.651 m)  PF: 98 L/min  PainSc: 0-No pain   Body mass index is 29.75 kg/m.  Advanced Directives 08/19/2020 06/11/2017 06/06/2016 06/06/2016 11/11/2015 06/01/2015 03/18/2015  Does Patient Have a Medical Advance Directive? Yes Yes Yes Yes No Yes Yes  Type of Advance Directive Living will;Healthcare Power of [employer] Power of [place];Living will - - - Public affairs consultant of [place];Living will Healthcare Power of [place];Living will  Does patient want to make changes to medical advance directive? No - Patient declined - - - - No - Patient declined No - Patient declined  Copy of Healthcare Power of Attorney in Chart? No - copy requested No - copy requested - - - No - copy requested -  Would patient like information on creating a medical advance directive? - - - - No - patient declined information - -    Current Medications (verified) Outpatient Encounter Medications as of 08/19/2020  Medication Sig  . albuterol (PROVENTIL HFA;VENTOLIN HFA) 108 (90 Base) MCG/ACT inhaler Inhale 2 puffs into the lungs every 6 (six) hours as needed for wheezing or shortness of breath.  Marland Kitchen amLODipine (NORVASC) 5 MG tablet TAKE 1 TABLET BY MOUTH EVERY DAY  . aspirin 81 MG chewable tablet Chew by mouth daily.  . clopidogrel (PLAVIX) 75 MG tablet TAKE 1 TABLET BY MOUTH EVERY DAY  . Coenzyme Q10 200 MG capsule Take 200 mg by mouth daily.   . fluticasone (FLONASE) 50 MCG/ACT nasal  spray Place 2 sprays into both nostrils daily.  . Garlic Oil 1000 MG CAPS Take 1 capsule by mouth daily.  . isosorbide mononitrate (IMDUR) 60 MG 24 hr tablet TAKE 1 TABLET BY MOUTH EVERY DAY  . loratadine (CLARITIN) 10 MG tablet Take 10 mg by mouth daily as needed for rhinitis.   Marland Kitchen losartan-hydrochlorothiazide (HYZAAR) 50-12.5 MG tablet TAKE 1 TABLET BY MOUTH EVERY DAY  . metoprolol tartrate (LOPRESSOR) 25 MG tablet TAKE 1 TABLET BY MOUTH TWICE A DAY  . Multiple Vitamins-Minerals (CENTRUM SILVER) tablet Take 1 tablet by mouth daily.  . nitroGLYCERIN (NITROSTAT) 0.4 MG SL tablet PLACE 1 TABLET (0.4 MG TOTAL) UNDER THE TONGUE EVERY 5 (FIVE) MINUTES AS NEEDED FOR CHEST PAIN.  Marland Kitchen Omega-3 Fatty Acids (FISH OIL) 1000 MG CAPS Take 1 capsule by mouth daily.  . pantoprazole (PROTONIX) 40 MG tablet TAKE 1 TABLET (40 MG TOTAL) BY MOUTH DAILY.  . ranolazine (RANEXA) 500 MG 12 hr tablet Take 1 tablet (500 mg total) by mouth 2 (two) times daily.  . rosuvastatin (CRESTOR) 5 MG tablet Take 1 tablet (5 mg total) by mouth every Monday, Wednesday, and Friday. (Patient not taking: Reported on 08/19/2020)  . tamsulosin (FLOMAX) 0.4 MG CAPS capsule Take 0.4 mg by mouth daily.   No facility-administered encounter medications on file as of 08/19/2020.    Allergies (verified) Crestor [rosuvastatin calcium], Lipitor [atorvastatin], Other, and Pravastatin   History: Past Medical History:  Diagnosis Date  . Allergic rhinitis   .  Aortic stenosis    mild echo 7/14  . Borderline type 2 diabetes mellitus   . Coronary artery disease   . Degenerative joint disease   . Family history of adverse reaction to anesthesia    "daughter had problems when she was a teen; none since; it was related to asthma"  . GERD (gastroesophageal reflux disease)   . Hemorrhoids   . History of hiatal hernia    "dx'd years ago" (06/01/2015)  . Hypercholesteremia   . Hypertension   . OSA on CPAP since 05/23/2015  . Prostatitis    hx of   . PVD (peripheral vascular disease) (HCC)   . Stroke [facility])    Past Surgical History:  Procedure Laterality Date  . CARDIAC CATHETERIZATION [name] 03/18/2015   Procedure: Left Heart Cath and Coronary Angiography;  Surgeon: Laurey Morale, MD;  Location: [facility] INVASIVE CV LAB;  Service: Cardiovascular;  Laterality: [name];  . CARDIAC CATHETERIZATION [name] 06/01/2015   Procedure: Coronary Stent Intervention;  Surgeon: Iran Ouch, MD;  Location: [facility] INVASIVE CV LAB;  Service: Cardiovascular;  Laterality: [name];  . CARDIAC CATHETERIZATION  11/11/2015  . CARDIAC CATHETERIZATION [name] 11/11/2015   Procedure: Intravascular Pressure Wire/FFR Study;  Surgeon: Tonny Bollman, MD;  Location: [facility] INVASIVE CV LAB;  Service: Cardiovascular;  Laterality: [name];  . CARPAL TUNNEL RELEASE Bilateral 2006-2007  . CATARACT EXTRACTION W/ INTRAOCULAR LENS  IMPLANT, BILATERAL Bilateral ~ 2003  . EXCISIONAL HEMORRHOIDECTOMY  1989   Family History  Problem Relation Age of Onset  . Hypertension Mother   . Diabetes Mother 38  . Stroke Father 9  . Arthritis Other   . Diabetes Brother    Social History   Socioeconomic History  . Marital status: Married    Spouse name: Not on file  . Number of children: Not on file  . Years of education: Not on file  . Highest education level: Not on file  Occupational History  . Occupation: retired  Tobacco Use  . Smoking status: Never Smoker  . Smokeless tobacco: Never Used  Vaping Use  . Vaping Use: Never used  Substance and Sexual Activity  . Alcohol use: No  . Drug use: No  . Sexual activity: Not Currently  Other Topics Concern  . Not on file  Social History Narrative   Daughter is [name] Psychologist, sport and exercise at [employer])   Social Determinants of Corporate investment banker Strain: Low Risk   . Difficulty of Paying Living Expenses: Not hard at all  Food Insecurity: No Food Insecurity  . Worried About Programme researcher, broadcasting/film/video in the Last Year: Never true  . Ran Out  of Food in the Last Year: Never true  Transportation Needs: No Transportation Needs  . Lack of Transportation (Medical): No  . Lack of Transportation (Non-Medical): No  Physical Activity: Sufficiently Active  . Days of Exercise per Week: 5 days  . Minutes of Exercise per Session: 30 min  Stress: No Stress Concern Present  . Feeling of Stress : Not at all  Social Connections: Socially Integrated  . Frequency of Communication with Friends and Family: More than three times a week  . Frequency of Social Gatherings with Friends and Family: More than three times a week  . Attends Religious Services: More than 4 times per year  . Active Member of Clubs or Organizations: Yes  . Attends Banker Meetings: More than 4 times per year  . Marital Status: Married    Tobacco  Counseling Counseling given: Not Answered   Clinical Intake:  Pre-visit preparation completed: Yes  Pain : No/denies pain Pain Score: 0-No pain     BMI - recorded: 29.62 Nutritional Status: BMI 25 -29 Overweight Nutritional Risks: None Diabetes: Yes CBG done?: No Did pt. bring in CBG monitor from home?: No  How often do you need to have someone help you when you read instructions, pamphlets, or other written materials from your doctor or pharmacy?: 1 - Never What is the last grade level you completed in school?: Master's Degree  Diabetic? yes     Information entered by :: Susie Cassette, LPN   Activities of Daily Living In your present state of health, do you have any difficulty performing the following activities: 08/19/2020 08/19/2020  Hearing? N N  Vision? N N  Difficulty concentrating or making decisions? N N  Walking or climbing stairs? N N  Dressing or bathing? N N  Doing errands, shopping? N N  Preparing Food and eating ? N -  Using the Toilet? N -  In the past six months, have you accidently leaked urine? N -  Do you have problems with loss of bowel control? N -  Managing your  Medications? N -  Managing your Finances? N -  Housekeeping or managing your Housekeeping? N -  Some recent data might be hidden    Patient Care Team: Pincus Sanes, MD as PCP - General (Internal Medicine) Laurey Morale, MD (Cardiology) Marcine Matar, MD (Urology) Dohmeier, Porfirio Mylar, MD (Neurology) Micki Riley, MD (Neurology) Kathyrn Sheriff, [facility] as Pharmacist (Pharmacist)  Indicate any recent Medical Services you may have received from other than [facility] providers in the past year (date may be approximate).     Assessment:   This is a routine wellness examination for Ronnie Boyd.  Hearing/Vision screen No exam data present  Dietary issues and exercise activities discussed: Current Exercise Habits: Home exercise routine, Type of exercise: walking, Time (Minutes): 30, Frequency (Times/Week): 5, Weekly Exercise (Minutes/Week): 150, Intensity: Mild, Exercise limited by: cardiac condition(s)  Goals    . exercise     Continue to do yard work and gardening; work in intervals      . Pharmacy Care Plan     CARE PLAN ENTRY (see longitudinal plan of care for additional care plan information)  Current Barriers:  . Chronic Disease Management support, education, and care coordination needs related to Hypertension, Hyperlipidemia, and Coronary Artery Disease   Hypertension BP Readings from Last 3 Encounters:  02/17/20 120/68  02/11/20 (!) 142/72  11/24/19 133/60 .  Pharmacist Clinical Goal(s): o Over the next 90 days, patient will work with PharmD and providers to maintain BP goal <140/90 . Current regimen:  o Amlodipine 5 mg daily o Losartan-HCTZ 50-12.5 mg daily o Metoprolol tartrate 25 mg twice a day o Isosorbide MN 60 mg daily . Interventions: o Discussed BP goals and benefits of medications for prevention of heart attack / stroke . Patient self care activities - Over the next 90 days, patient will: o Check BP daily, document, and provide at future  appointments o Ensure daily salt intake < 2300 mg/day  Hyperlipidemia / CAD Lab Results  Component Value Date/Time   LDLCALC 104 (H) 05/17/2020 01:31 PM   LDLCALC 74 04/04/2015 10:59 AM   LDLDIRECT 131.5 11/06/2011 10:57 AM .  Pharmacist Clinical Goal(s): o Over the next 90 days, patient will work with PharmD and providers to achieve LDL goal < 70 . Current regimen:  o Rosuvastatin 5 mg MWF o Ranolazine 500 mg twice a day o Isosorbide MN 50 mg daily o Nitroglycerin 0.4 mg SL prn  o Aspirin 81 mg daily o Clopidogrel 75 mg daily o Coenzyme Q10 200 mg daily o Omega-3 Fish oil 1000 mg daily . Interventions: o Discussed cholesterol goals and benefits of medications for prevention of heart attack / stroke o Discussed cholesterol content in food . Patient self care activities - Over the next 90 days, patient will: o Continue medication as prescribed o Reduce cholesterol in food (try egg whites or Egg Beaters instead of regular eggs)  Medication management . Pharmacist Clinical Goal(s): o Over the next 90 days, patient will work with PharmD and providers to maintain optimal medication adherence . Current pharmacy[facility] . Interventions o Comprehensive medication review performed. o Continue current medication management strategy . Patient self care activities - Over the next 90 days, patient will: o Focus on medication adherence by pill box o Take medications as prescribed o Report any questions or concerns to PharmD and/or provider(s)  Initial goal documentation    . Stayt as active and as independent as possible     Start to garden in pots, do chair exercises, eat more fruits and vegetables, increase the amount of water I drink, enjoy life and family.     . Weight (lb) < 170 lb (77.1 kg)     Will lose a few pounds; Try to walk  - 30 minutes 5 days a week             Depression Screen PHQ 2/9 Scores 08/19/2020 08/19/2020 02/06/2019 06/11/2017 05/08/2017 06/06/2016 11/02/2015   PHQ - 2 Score 0 0 0 0 0 0 0  PHQ- 9 Score - - - 0 - - -    Fall Risk Fall Risk  08/19/2020 08/19/2020 08/14/2019 02/06/2019 06/11/2017  Falls in the past year? 0 0 0 0 No  Number falls in past yr: 0 0 0 0 -  Injury with Fall? 0 0 - - -  Risk for fall due to : No Fall Risks No Fall Risks - - -  Follow up Falls evaluation completed Falls evaluation completed - - -    FALL RISK PREVENTION PERTAINING TO THE HOME:  Any stairs in or around the home? No  If so, are there any without handrails? No  Home free of loose throw rugs in walkways, pet beds, electrical cords, etc? Yes  Adequate lighting in your home to reduce risk of falls? Yes   ASSISTIVE DEVICES UTILIZED TO PREVENT FALLS:  Life alert? No  Use of a cane, walker or w/c? No  Grab bars in the bathroom? Yes  Shower chair or bench in shower? No  Elevated toilet seat or a handicapped toilet? Yes   TIMED UP AND GO:  Was the test performed? No .  Length of time to ambulate 10 feet: 0 sec.   Gait steady and fast without use of assistive device  Cognitive Function: MMSE - Mini Mental State Exam 06/11/2017 06/06/2016 06/06/2016  Not completed: - (No Data) (No Data)  Orientation to time 5 - -  Orientation to Place 5 - -  Registration 3 - -  Attention/ Calculation 5 - -  Recall 2 - -  Language- name 2 objects 2 - -  Language- repeat 1 - -  Language- follow 3 step command 3 - -  Language- read & follow direction 1 - -  Write a sentence 1 - -  Copy design 1 - -  Total score 29 - -        Immunizations Immunization History  Administered Date(s) Administered  . Fluad Quad(high Dose 65+) 06/19/2019, 06/13/2020  . Influenza Split 07/02/2011, 07/07/2012  . Influenza Whole 05/31/2008, 07/12/2009, 06/27/2010  . Influenza, High Dose Seasonal PF 06/26/2013, 06/22/2014, 06/29/2015, 06/06/2016, 06/11/2017, 06/11/2018, 07/20/2020  . PFIZER SARS-COV-2 Vaccination 06/17/2020, 07/20/2020  . Pneumococcal Conjugate-13 12/23/2014  .  Pneumococcal Polysaccharide-23 12/22/2013, 01/31/2017, 07/21/2019, 07/20/2020  . Tdap 11/08/2004, 12/12/2014    TDAP status: Up to date  Flu Vaccine status: Up to date  Pneumococcal vaccine status: Up to date  Covid-19 vaccine status: Completed vaccines  Qualifies for Shingles Vaccine? No   Zostavax completed No   Shingrix Completed?: No.    Education has been provided regarding the importance of this vaccine. Patient has been advised to call insurance company to determine out of pocket expense if they have not yet received this vaccine. Advised may also receive vaccine at local pharmacy or Health Dept. Verbalized acceptance and understanding.  Screening Tests Health Maintenance  Topic Date Due  . FOOT EXAM  06/11/2018  . OPHTHALMOLOGY EXAM  09/17/2018  . HEMOGLOBIN A1C  08/18/2020  . COVID-19 Vaccine (3 - Booster for Pfizer series) 01/17/2021  . TETANUS/TDAP  12/11/2024  . INFLUENZA VACCINE  Completed  . PNA vac Low Risk Adult  Completed    Health Maintenance  Health Maintenance Due  Topic Date Due  . FOOT EXAM  06/11/2018  . OPHTHALMOLOGY EXAM  09/17/2018  . HEMOGLOBIN A1C  08/18/2020    Colorectal cancer screening: No longer required.   Lung Cancer Screening: (Low Dose CT Chest recommended if Age 76-80 years, 30 pack-year currently smoking OR have quit w/in 15years.) does not qualify.   Lung Cancer Screening Referral: no  Additional Screening:  Hepatitis C Screening: does not qualify; Completed no  Vision Screening: Recommended annual ophthalmology exams for early detection of glaucoma and other disorders of the eye. Is the patient up to date with their annual eye exam?  Yes  Who is the provider or what is the name of the office in which the patient attends annual [facility] exams[facility]  If pt is not established with a provider, would they like to be referred to a provider to establish care? No .   Dental Screening: Recommended annual dental exams  for proper oral hygiene  Community Resource Referral / Chronic Care Management: CRR required this visit?  No   CCM required this visit?  No      Plan:     I have personally reviewed and noted the following in the patient's chart:   . Medical and social history . Use of alcohol, tobacco or illicit drugs  . Current medications and supplements . Functional ability and status . Nutritional status . Physical activity . Advanced directives . List of other physicians . Hospitalizations, surgeries, and ER visits in previous 12 months . Vitals . Screenings to include cognitive, depression, and falls . Referrals and appointments  In addition, I have reviewed and discussed with patient certain preventive protocols, quality metrics, and best practice recommendations. A written personalized care plan for preventive services as well as general preventive health recommendations were provided to patient.     Mickeal NeedyShenika N Birdie Beveridge, LPN   16/10/960412/06/2020   Nurse Notes:  Normal cognitive status assessed by direct observation by this Nurse Health Advisor. No abnormalities found.

## 2020-08-19 NOTE — Assessment & Plan Note (Signed)
Chronic We will check A1c today Overall has been well controlled with diet Encouraged him to continue walking regularly Continue diabetic diet

## 2020-08-19 NOTE — Assessment & Plan Note (Signed)
Chronic Continue CPAP nightly

## 2020-09-09 ENCOUNTER — Other Ambulatory Visit: Payer: Self-pay | Admitting: Internal Medicine

## 2020-09-11 ENCOUNTER — Other Ambulatory Visit: Payer: Self-pay | Admitting: Internal Medicine

## 2020-09-27 ENCOUNTER — Ambulatory Visit: Payer: Self-pay | Admitting: Neurology

## 2020-10-12 ENCOUNTER — Telehealth: Payer: Medicare PPO

## 2020-11-01 ENCOUNTER — Ambulatory Visit: Payer: Medicare PPO | Admitting: Neurology

## 2020-11-01 ENCOUNTER — Encounter: Payer: Self-pay | Admitting: Neurology

## 2020-11-01 VITALS — BP 128/63 | HR 70 | Ht 65.0 in | Wt 174.0 lb

## 2020-11-01 DIAGNOSIS — Z8673 Personal history of transient ischemic attack (TIA), and cerebral infarction without residual deficits: Secondary | ICD-10-CM

## 2020-11-01 DIAGNOSIS — R040 Epistaxis: Secondary | ICD-10-CM | POA: Diagnosis not present

## 2020-11-01 DIAGNOSIS — G4739 Other sleep apnea: Secondary | ICD-10-CM

## 2020-11-01 DIAGNOSIS — R063 Periodic breathing: Secondary | ICD-10-CM

## 2020-11-01 DIAGNOSIS — T8189XA Other complications of procedures, not elsewhere classified, initial encounter: Secondary | ICD-10-CM

## 2020-11-01 NOTE — Patient Instructions (Signed)
Ronnie Boyd had been 100% compliant user for many years on CPAP ,is now using an AutoSet between 5 and 10 cmH2O with 3 cm expiratory pressure relief, average use of time 7 hours 50 minutes, residual AHI 1.4 as of October 22, 2017)   It was my great surprise that now 3 years later he has been unable to use his CPAP for almost a year his residual AHI has risen to 9.8/h and central apneas dominate the residual apnea index at 5.5/h.  The AutoSet settings are still the same sleep centimeter EPR but he only tolerated the machine barely because of epistaxis the feeling of dryness and the feeling of too much pressure.  He now has for 1 hour and 2 minutes of his nighttime use of 8 hours a Cheyne-Stokes respiration noted.  This kind of cyclic breathing can be a symptom of too much pressure and can also mean that he still has apnea but a different form and baseline central apnea that does not respond well to CPAP anymore.  For this reason I will invite him to have a new sleep study before we replace any kind of machine I would love to use the hospital lab for a retitration if a titration is needed.  So for baseline I will order a home sleep test to that the patient can do this at his private home and if central apneas are evident I would certainly want to see him in the sleep lab for a titration to CPAP BiPAP or ASV.

## 2020-11-01 NOTE — Progress Notes (Signed)
[name] [address] [place]. [place] [address] [phone]                                                                      [place]          Mr. BREXTON SOFIA Date of Birth:  August 27, 1982 Medical Record Number:  [id number]   Referring MD:  Antony Contras, MD   Reason for Referral: originally for  Stroke and apnea;    Mr Bouknight is seen here today in a RV accompanied by his spouse, today and see date of 2-22-222: He was just seen in march 2021 by NP Clabe Seal, and the year before by the Stroke team Np McCue.  Mr. Dorsch had been diagnosed in August 2016 and a split-night polysomnography with sleep apnea I would like to add that his sleep efficiency for the baseline part of the study was very poor he only slept about 50% of the first 2 hours his apnea hypopnea index was 28.6 but he snored very loudly the RDI was 55.6.  Supine or nonsupine sleep did not contribute much to the degree of apnea and he did not enter REM sleep.  There were no periodic limb movements.  He underwent a titration to CPAP with 9 cmH2O and he used a pico nasal mask at the time.   However his download today tells me clearly that he is using an auto titration CPAP so he must have had an new machine at some time after the sleep study.  I cannot find a newer sleep study to document any changes. His use a days only for of the last 60 days so his compliance is 7% the AutoSet is set between 5 and 10 cmH2O with 3 cm EPR and his residual AHI is high at 9.8.  Most of the apneas that are emerging, central and not obstructive in nature.  The 95th percentile pressure is 9.9 cm so he is not covered for that pressure need the events per hour are strictly more apneas and hypopneas this also speaks for more central apnea emerging.  He was in Cheyne-Stokes respiration for about 12% of the night.    He did not bring the machine to this visit !    He reports that he could not tolerate the CPAP well because  it gave him epistaxis nosebleeds. He feels like he has chest cold when using it, he has discomfort- therefor he stopped using it 10 month ago.   Based on this I think we need to reevaluate the patient's apnea and probably consider BiPAP or even ASV to treat his sleep apnea given the high as central component.  It is also possible that the CPAP elicited central apnea.  Should this be the case and his obstructive apnea can be better controlled on BiPAP we would not have to adjust to ASV and if the patient is clearly suffering from obstructive sleep apnea still at baseline I would actually consider suggesting the inspire device to him.  Mr. Guzzi is not aware that he had another stroke and there seems to be no history of an emerging congestive heart failure, liver or kidney disease.    HPI: Mr. Marszalek was seen during his  hospitalization by my colleague, Dr. Antony Contras. He had presented with a recent stroke that affected the thalamic region. One of his symptoms was leaning towards the left and weaker side. Doctor Leonie Man  had added a baby aspirin to Plavix 75 mg daily , and had explained the goals of control of hypertension blood pressure and cholesterol. The patient was advised that he should partake in regular exercise, moderate diet.  The patient noted that he likes to walk but that he developed cramps in his thighs and both lower extremities , which he attributes to his cholesterol-lowering medication.  Change from Lipitor to Pravachol followed recently. This caused him to have myositis symptoms. I quote from  Doolittle's note dated 03-17-15 that the patient was noted by family to develop excessive sleepiness over the preceding 2 weeks ago . The symptoms lasted for several days before .Dr. Laney Pastor also noticed that he was slightly off balance, identifying a fall risk. The wife noted his right eyelid to be droopy but there was no vision impairmentHis symptoms had improved and he was also not quite as sleepy  anymore , walking better when he was finally receiving an outpatient MRI scan of the brain on 6-20 4-16 which showed a small acute right medial thalamic and midbrain infarct.  The MRI of the brain showed no large vessel stenosis and carotid ultrasound from 6-20 9-16 reviewed by Dr. Leonie Man. showed no significant stenosis in the extracranial parts. He had a transthoracic cardiac echo done which was normal scheduled to undergo cardiac catheterization with Dr. Loralie Champagne.  SLEEP : The patient reports that he has no trouble falling asleep when watching TV in his living room. He is has an easier time sleeping in a seated position or reclined position. When he transfers to the bedroom he struggles to find a comfortable sleep position and to go to sleep. He describes his marital bedroom is cool, quiet and dark. He sleeps on his back on multiple pillows .The patient has to go to the bathroom at least 2-3 times at night and he is taking diabetic medications. Usually can reinitiate sleep quickly after bathroom break. He rises in the morning at about 6:30 AM spontaneously without alarm. He does not describe any morning headaches, he wakes up with a dry mouth. His wife states usually in bed he often returns to the bedroom a nap for another hour. Does not drink any caffeinated beverages. He is retired from his career job but he does keep his  hobby of lawn and garden care.He takes naps, after lunch, for 1 hour . He can easily fall asleep in daytime. He takes an after dinner nap daily. Total sleep time over 24 hours is about 8 hours. He no history of ENT, neck of facial surgery.  Denies falling asleep driving.  He feels back to baseline.  Interval history, dated 05-23-15 ; Mr. Tashiro underwent a sleep study was split night protocol on 04-19-15 . He  had endorsed the Epworth sleepiness score in the sleep lab at only 3 points but previously in the office endorsed 11 points. He was only asleep for about 50% of the recorded time  for the diagnostic part of the study.  He was diagnosed with moderate to severe apnea at an AHI of 28.6 and severe upper airway restriction with an RDI of 55.6. In supine sleep his AHI was 30.4 and unfortunately REM sleep was not recorded. His heart rate varied between 42 and 55.The patient was titrated to CPAP and  the AHI became 0.0 under 9 cm water pressure.  Mr Markoff is a pleasant 41 year African-American male who was noted by family to develop excessive sleepiness a few weeks ago. The symptoms lasted for several days before he saw his primary physician who also noticed that he was slightly off balance when walking. The wife and also noticed slight drooping of his right eyelid but the patient denied any diplopia, loss of vision, blurred vision, headache, speech difficulties. Over the last week or so his symptoms have improved and is no longer as sleepy and is walking much better. The patient had an outpatient MRI scan of the brain done on 03/04/15 which have personally reviewed and shows a small acute right medial thalamic/midbrain lacunar infarct. MRA of the brain shows no large vessel and the canal stenosis. Carotid ultrasound done on 03/09/15 reviewed by me shows no significant extracranial stenosis. Patient also had transthoracic echo done which was normal. He had a nuclear medicine scan done 5 admission for chest pain or shortness of breath which was abnormal and is scheduled to undergo cardiac catheterization tomorrow by Dr. Loralie Champagne. He has no prior history of strokes, TIAs. He was started recently on aspirin 81 mg daily as well as protocol which has taken only for 4 weeks. He denies smoking or drinking excessive alcohol. There is no family history of strokes.  Update 06/22/15 Sethi : He returns today for follow-up after last visit 3 months ago. He has had no recurrent stroke or TIA symptoms and is doing well from neurovascular standpoint. He had lipid profile checked 2 months ago and LDL cholesterol  was 74 mg percent and hemoglobin A1c was 6.5. He has seen Dr. Brett Fairy and been diagnosed with sleep apnea and started using CPAP regularly which is helping him. He was found to have occlusive cardiac disease and underwent coronary angioplasty stenting by Dr. Fletcher Anon on 06/01/15 and is doing well after that. He is now on aspirin and Plavix and tolerating it well with only minor bruising but no bleeding. His blood pressure is well controlled and today it is 138/86.  Update 05/07/2017 Sethi : He returns for follow-up after last visit with me nearly 2 years ago. He continues to do well and has not had recurrent stroke or TIA symptoms now for more than 2 years. He is tolerating Plavix well without bleeding or bruising. His blood pressure is usually well controlled but recently he discontinued his blood pressure medications since he thought he was taking too many medicines. He does have an appointment with his primary care physician tomorrow to really adjust his blood pressure medicines. It is elevated today at 177/81 in office. Patient has a history of statin intolerance. He wasn't protocoled for last 23 years and complains of almost daily cramps. In the past he was on Lipitor for a few years and also had similar muscle cramps. Patient has not had any recurrent stroke or TIA symptoms. He continues to see Dr. Brett Fairy for his sleep apnea and states he is quite compliant with his CPAP every day. He has no new complaints today.  Interval history from 24 October 2017, I see Mr. Bamber today in a compliance visit for CPAP use.  Mr. EMANUELE MCWHIRTER is a meanwhile 87 year old African-American right-handed gentleman who has been using CPAP for well over 5 years now.  His average use of time is 7 hours and 50 minutes with 100% compliance.  He is using an AutoSet between 5 and 10 cmH2O  with 3 cm pressure expiratory relief.  He has mild air leaks, 95th percentile pressure is at 10 cmH2O residual AHI is 1.4 apneas and hypopneas per  hour of sleep.  Central apneas are not emerging.  There is no reason for any adjustment. [facility] is his DME.  Mr Mcdowell reports that his wife suffers from Parkinson's Disease and his family relations live in [place]. He is still very active, drives and appears energetic.    ROS:  14 system review of systems is positive for leg swelling, constipation and all other systems negative:  no snoring, no nocturia, hypersomnia is controlled. Highly compliant CPAP user.   LVEF >65%, moderate LVH, normal wall motion, grade 1 DD, elevated LV  filling pressure, normal RV systolic function, mild LAE, MAC with  trivial MR, heavily calcified aortic valve with severe stenosis  (mean gradient 38 mmHg - AVA 0.8 cm2) and mild AI, no significant  TR, normal IVC  FINDINGS  Left Ventricle: The left ventricle has hyperdynamic systolic function,  with an ejection fraction of >65%. The cavity size was normal. There is  moderately increased left ventricular wall thickness. Left ventricular  diastolic Doppler parameters are  consistent with impaired relaxation. Elevated left atrial and left  ventricular end-diastolic pressures The E/e' is >20. No evidence of left  ventricular regional wall motion abnormalities.Marland Kitchen    PMH:  Past Medical History:  Diagnosis Date  . Allergic rhinitis   . Aortic stenosis    mild echo 7/14  . Borderline type 2 diabetes mellitus   . Coronary artery disease   . Degenerative joint disease   . Family history of adverse reaction to anesthesia    "daughter had problems when she was a teen; none since; it was related to asthma"  . GERD (gastroesophageal reflux disease)   . Hemorrhoids   . History of hiatal hernia    "dx'd years ago" (06/01/2015)  . Hypercholesteremia   . Hypertension   . OSA on CPAP since 05/23/2015  . Prostatitis    hx of  . PVD (peripheral vascular disease) ([place])   . Stroke [facility])     Social History:  Social History   Socioeconomic History  . Marital status:  Married    Spouse name: Not on file  . Number of children: Not on file  . Years of education: Not on file  . Highest education level: Not on file  Occupational History  . Occupation: retired  Tobacco Use  . Smoking status: Never Smoker  . Smokeless tobacco: Never Used  Vaping Use  . Vaping Use: Never used  Substance and Sexual Activity  . Alcohol use: No  . Drug use: No  . Sexual activity: Not Currently  Other Topics Concern  . Not on file  Social History Narrative   Daughter is Mrs. Juliane Lack Runner, broadcasting/film/video at [employer])   Social Determinants of Radio broadcast assistant Strain: Low Risk   . Difficulty of Paying Living Expenses: Not hard at all  Food Insecurity: No Food Insecurity  . Worried About Charity fundraiser in the Last Year: Never true  . Ran Out of Food in the Last Year: Never true  Transportation Needs: No Transportation Needs  . Lack of Transportation (Medical): No  . Lack of Transportation (Non-Medical): No  Physical Activity: Sufficiently Active  . Days of Exercise per Week: 5 days  . Minutes of Exercise per Session: 30 min  Stress: No Stress Concern Present  . Feeling of Stress :  Not at all  Social Connections: Socially Integrated  . Frequency of Communication with Friends and Family: More than three times a week  . Frequency of Social Gatherings with Friends and Family: More than three times a week  . Attends Religious Services: More than 4 times per year  . Active Member of Clubs or Organizations: Yes  . Attends Archivist Meetings: More than 4 times per year  . Marital Status: Married  Human resources officer Violence: Not on file    Medications:   Current Outpatient Medications on File Prior to Visit  Medication Sig Dispense Refill  . albuterol (PROVENTIL HFA;VENTOLIN HFA) 108 (90 Base) MCG/ACT inhaler Inhale 2 puffs into the lungs every 6 (six) hours as needed for wheezing or shortness of breath. 1 Inhaler 0  . amLODipine (NORVASC)  5 MG tablet TAKE 1 TABLET BY MOUTH EVERY DAY 90 tablet 0  . aspirin 81 MG chewable tablet Chew by mouth daily.    . clopidogrel (PLAVIX) 75 MG tablet TAKE 1 TABLET BY MOUTH EVERY DAY 90 tablet 3  . Coenzyme Q10 200 MG capsule Take 200 mg by mouth daily.     . fluticasone (FLONASE) 50 MCG/ACT nasal spray Place 2 sprays into both nostrils daily. 16 g 0  . Garlic Oil 2836 MG CAPS Take 1 capsule by mouth daily.    . isosorbide mononitrate (IMDUR) 60 MG 24 hr tablet TAKE 1 TABLET BY MOUTH EVERY DAY 90 tablet 2  . loratadine (CLARITIN) 10 MG tablet Take 10 mg by mouth daily as needed for rhinitis.     Marland Kitchen losartan-hydrochlorothiazide (HYZAAR) 50-12.5 MG tablet TAKE 1 TABLET BY MOUTH EVERY DAY 90 tablet 0  . metoprolol tartrate (LOPRESSOR) 25 MG tablet TAKE 1 TABLET BY MOUTH TWICE A DAY 180 tablet 2  . Multiple Vitamins-Minerals (CENTRUM SILVER) tablet Take 1 tablet by mouth daily.    . nitroGLYCERIN (NITROSTAT) 0.4 MG SL tablet PLACE 1 TABLET (0.4 MG TOTAL) UNDER THE TONGUE EVERY 5 (FIVE) MINUTES AS NEEDED FOR CHEST PAIN. 25 tablet 3  . Omega-3 Fatty Acids (FISH OIL) 1000 MG CAPS Take 1 capsule by mouth daily.    . pantoprazole (PROTONIX) 40 MG tablet TAKE 1 TABLET (40 MG TOTAL) BY MOUTH DAILY. 30 tablet 11  . ranolazine (RANEXA) 500 MG 12 hr tablet Take 1 tablet (500 mg total) by mouth 2 (two) times daily. 180 tablet 3  . rosuvastatin (CRESTOR) 5 MG tablet Take 1 tablet (5 mg total) by mouth every Monday, Wednesday, and Friday. 45 tablet 3  . tamsulosin (FLOMAX) 0.4 MG CAPS capsule Take 0.4 mg by mouth daily.     No current facility-administered medications on file prior to visit.    Allergies:   Allergies  Allergen Reactions  . Crestor [Rosuvastatin Calcium]     Muscle cramping  . Lipitor [Atorvastatin]     Leg cramping  . Other     Environmental   . Pravastatin     Leg cramping    Physical Exam  Neck circumference 19.5 inches " Mallampati 4 . Retrognathia.  BMI is elevated. 28.96  kg/m2.    General: Mildly obese elderly right handed African-American male, seated, in no evident distress, well groomed.  Head: head normocephalic and atraumatic.    Neck: supple with no carotid or supraclavicular bruits Cardiovascular: regular rate and rhythm, harsh ejection systolic murmur heard all over the precordium and on the right side of the sternum Musculoskeletal: no deformity Skin:  no rash/petichiae Vascular:  Normal pulses all extremities  Neurologic Exam Mental Status: Awake and fully alert.  Mr. Washington speech is fluent and sonor, there is no sign of dysarthria, mild dys-  phonia, aphasia.  Oriented to place and time. Recent and remote memory intact. Attention span, concentration and fund of knowledge appropriate. Mood and affect appropriate.  Cranial Nerves: Fundoscopic exam deferred -Pupils equal, briskly reactive to light. Arcus senilis.  Extraocular movements full without nystagmus. There is no eyelid drooping ( ptosis)   Noted-  Visual fields full to confrontation. Hearing impaired.  Facial sensation intact. Face, tongue, palate moves normally and symmetrically.  Motor: Normal bulk and tone. Normal strength in all tested extremity muscles. Sensory.: intact to touch , pinprick , position and vibratory sensation- primary modalities. .  Coordination:Finger-to-nose performed accurately bilaterally. Gait and Station: Arises from chair without difficulty, not bracing himself. Stance is normal. Reflexes: 1+ and symmetric.    ASSESSMENT: Mr. Linsley is a 87 year old very  pleasant African-American male with history of  excessive daytime sleepiness, transient drooping of the right eyelid and gait and balance difficulties-  due to right thalamic/midbrain lacunar infarct likely from small vessel disease with multiple vascular risk factors of hypertension, hyperlipidemia, obesity, age and  sleep apnea.  Mr. Caporale had been 100% compliant user for many years on CPAP ,is now using an AutoSet  between 5 and 10 cmH2O with 3 cm expiratory pressure relief, average use of time 7 hours 50 minutes, residual AHI 1.4 as of October 22, 2017)   It was my great surprise that now 3 years later he has been unable to use his CPAP for almost a year his residual AHI has risen to 9.8/h and central apneas dominate the residual apnea index at 5.5/h.  The AutoSet settings are still the same sleep centimeter EPR but he only tolerated the machine barely because of epistaxis the feeling of dryness and the feeling of too much pressure.  He now has for 1 hour and 2 minutes of his nighttime use of 8 hours a Cheyne-Stokes respiration noted.  This kind of cyclic breathing can be a symptom of too much pressure and can also mean that he still has apnea but a different form and baseline central apnea that does not respond well to CPAP anymore.  For this reason I will invite him to have a new sleep study before we replace any kind of machine I would love to use the hospital lab for a retitration if a titration is needed.  So for baseline I will order a home sleep test to that the patient can do this at his private home and if central apneas are evident I would certainly want to see him in the sleep lab for a titration to CPAP BiPAP or ASV.   PLAN:  HST ordered, Rv with me or NP in 3 month.    Larey Seat, MD

## 2020-11-21 ENCOUNTER — Ambulatory Visit (INDEPENDENT_AMBULATORY_CARE_PROVIDER_SITE_OTHER): Payer: Medicare PPO | Admitting: Neurology

## 2020-11-21 DIAGNOSIS — R040 Epistaxis: Secondary | ICD-10-CM

## 2020-11-21 DIAGNOSIS — G4733 Obstructive sleep apnea (adult) (pediatric): Secondary | ICD-10-CM | POA: Diagnosis not present

## 2020-11-21 DIAGNOSIS — G4739 Other sleep apnea: Secondary | ICD-10-CM

## 2020-11-21 DIAGNOSIS — Z8673 Personal history of transient ischemic attack (TIA), and cerebral infarction without residual deficits: Secondary | ICD-10-CM

## 2020-11-21 DIAGNOSIS — R063 Periodic breathing: Secondary | ICD-10-CM

## 2020-11-24 NOTE — Progress Notes (Signed)
[facility]  HOME SLEEP TEST (Watch PAT)  STUDY DATA: 11/24/20  DOB: 07/14/1927  MRN: [id number]  ORDERING CLINICIAN: Carmen Dohmeier, ME   REFERRING CLINICIAN: Pramod Sethi, MD and Stacy Burns, MD  CLINICAL INFORMATION/HISTORY: Ronnie Boyd is a 87 year old stroke patient seen here today in a RV accompanied by his spouse, date of 2-22-222: He was last seen by NP [place], and before by the Stroke Team NP McCue.  Ronnie. Luginbill had been diagnosed with OSA in August 2016 by split-night polysomnography -I would like to add that his sleep efficiency for the baseline part of the study was very poor,he only slept about 50% of the first 2 hours, and  his apnea hypopnea index was 28.6/h.  He snored very loudly, the RDI was 55.6.  Supine or nonsupine sleep did not contribute much to the degree of apnea and he did not enter REM sleep.  There were no periodic limb movements.  He underwent a titration to CPAP with 9 cmH2O and he used a pico nasal mask at the time.   However his download today tells me clearly that he is using an auto titration CPAP so he must have had an new machine at some time after the sleep study.  I cannot find a newer sleep study to document any changes. His use by days only for of the last 60 days is so low, his compliance is 7% the AutoSet is set between 5 and 10 cmH2O with 3 cm EPR and his residual AHI is high at 9.8/h.   Most of the apneas that are emerging, are central and not obstructive in nature.   The 95th percentile pressure is 9.9 cm so he is not covered for that pressure need, also speaks for more central apnea emerging.  He was in Cheyne-Stokes respiration for about 12% of the night.   He did not bring the machine to this visit !  He reports that he could not tolerate the CPAP well because it gave him epistaxis nosebleeds. He feels like he has chest cold when using it, he has discomfort- therefor he stopped using it 10 month ago.  We are now planning to see  his new baseline by HST or attended sleep study, we may have to go to BiPAP or ASV if central apneas are now dominant. Cheyne stokes respirations.  Epworth sleepiness score: 16/24  BMI: 29.0 kg/m  Neck Circumference: 19.5"  FINDINGS:   Total Record Time (hours, min): 10 h 4 min  Total Sleep Time (hours, min):  9 h 16 min   Percent REM (%):    21.74 %   Calculated pAHI (per hour): 14.4      REM pAHI: 17.2    NREM pAHI: 13.6 Supine AHI: 14.3   Oxygen Saturation (%) Mean: 96  Minimum oxygen saturation (%):        83  O2 Saturation Range (%): 83-100  O2Saturation (minutes) <=88%: 0.1 min   Pulse Mean (bpm):    59  Pulse Range (43-84)   IMPRESSION: This HST confirmed the presence of mild OSA (obstructive sleep apnea) . The algorithms did not indicate central apneas to be present. OSA AHI was 14.4/h oxygen nadir was 83% without prolonged desaturation time.   RECOMMENDATION:  This HST indicated the presence of mild OSA without significant hypoxia or REM accentuation. This form of apnea should be treated with CPAP, and given his data from home use, I would reset the auto CPAP   to a setting from 6-15 cm water, 3 cm EPR and a mask of the patient's choice and comfort. Please make patient aware that he can exchange and refit for a mask within 30 days.  RV with NP after 60-90 days of use.    INTERPRETING PHYSICIAN:  Carmen Dohmeier, MD   [name] and [facility] certified by The American Board of Sleep Medicine and Diplomate of the American Academy of Sleep Medicine. Board certified In Neurology through the ABPN, Fellow of the American Academy of Neurology. Medical Director of [facility].    

## 2020-11-30 NOTE — Addendum Note (Signed)
Addended by: Melvyn Novas on: 11/30/2020 06:48 PM   Modules accepted: Orders

## 2020-11-30 NOTE — Procedures (Signed)
[facility]  HOME SLEEP TEST (Watch PAT)  STUDY DATA: 11/24/20  DOB: 1927-06-08  MRN: [id number]  ORDERING CLINICIAN: Melvyn Novas, ME   REFERRING CLINICIAN: Delia Heady, MD and Cheryll Cockayne, MD  CLINICAL INFORMATION/HISTORY: [name] is a 87 year old stroke patient seen here today in a RV accompanied by his spouse, date of 2-22-222: He was last seen by NP Ethelene Browns, and before by the Stroke Team NP McCue.  [name] had been diagnosed with OSA in August 2016 by split-night polysomnography -I would like to add that his sleep efficiency for the baseline part of the study was very poor,he only slept about 50% of the first 2 hours, and  his apnea hypopnea index was 28.6/h.  He snored very loudly, the RDI was 55.6.  Supine or nonsupine sleep did not contribute much to the degree of apnea and he did not enter REM sleep.  There were no periodic limb movements.  He underwent a titration to CPAP with 9 cmH2O and he used a pico nasal mask at the time.   However his download today tells me clearly that he is using an auto titration CPAP so he must have had an new machine at some time after the sleep study.  I cannot find a newer sleep study to document any changes. His use by days only for of the last 60 days is so low, his compliance is 7% the AutoSet is set between 5 and 10 cmH2O with 3 cm EPR and his residual AHI is high at 9.8/h.   Most of the apneas that are emerging, are central and not obstructive in nature.   The 95th percentile pressure is 9.9 cm so he is not covered for that pressure need, also speaks for more central apnea emerging.  He was in Cheyne-Stokes respiration for about 12% of the night.   He did not bring the machine to this visit !  He reports that he could not tolerate the CPAP well because it gave him epistaxis nosebleeds. He feels like he has chest cold when using it, he has discomfort- therefor he stopped using it 10 month ago.  We are now planning to see  his new baseline by HST or attended sleep study, we may have to go to BiPAP or ASV if central apneas are now dominant. Cheyne stokes respirations.  Epworth sleepiness score: 16/24  BMI: 29.0 kg/m  Neck Circumference: 19.5"  FINDINGS:   Total Record Time (hours, min): 10 h 4 min  Total Sleep Time (hours, min):  9 h 16 min   Percent REM (%):    21.74 %   Calculated pAHI (per hour): 14.4      REM pAHI: 17.2    NREM pAHI: 13.6 Supine AHI: 14.3   Oxygen Saturation (%) Mean: 96  Minimum oxygen saturation (%):        83  O2 Saturation Range (%): 83-100  O2Saturation (minutes) <=88%: 0.1 min   Pulse Mean (bpm):    59  Pulse Range (43-84)   IMPRESSION: This HST confirmed the presence of mild OSA (obstructive sleep apnea) . The algorithms did not indicate central apneas to be present. OSA AHI was 14.4/h oxygen nadir was 83% without prolonged desaturation time.   RECOMMENDATION:  This HST indicated the presence of mild OSA without significant hypoxia or REM accentuation. This form of apnea should be treated with CPAP, and given his data from home use, I would reset the auto CPAP  to a setting from 6-15 cm water, 3 cm EPR and a mask of the patient's choice and comfort. Please make patient aware that he can exchange and refit for a mask within 30 days.  RV with NP after 60-90 days of use.    INTERPRETING PHYSICIAN:  Melvyn Novas, MD   [name] Sleep Board certified by The [employer] of Sleep Medicine and Diplomate of the [employer] of Sleep Medicine. Board certified In Neurology through the ABPN, Fellow of the [employer] of Neurology. Medical Director of [employer].

## 2020-11-30 NOTE — Progress Notes (Signed)
IMPRESSION: This HST confirmed the presence of mild OSA (obstructive sleep apnea) . The algorithms did not indicate central apneas to be present. OSA AHI was 14.4/h oxygen nadir was 83% without prolonged desaturation time.   RECOMMENDATION:  This HST indicated the presence of mild OSA without significant hypoxia or REM accentuation. This form of apnea should be treated with CPAP, and given his data from home use, I would reset the auto CPAP to a setting from 6-15 cm water, 3 cm EPR and a mask of the patient's choice and comfort. Please make patient aware that he can exchange and refit for a mask within 30 days.  RV with NP after 60-90 days of use.    INTERPRETING PHYSICIAN:  Melvyn Novas, MD

## 2020-12-01 ENCOUNTER — Telehealth: Payer: Self-pay | Admitting: Neurology

## 2020-12-01 NOTE — Telephone Encounter (Signed)
-----   Message from Melvyn Novas, MD sent at 11/30/2020  6:48 PM EDT ----- IMPRESSION: This HST confirmed the presence of mild OSA (obstructive sleep apnea) . The algorithms did not indicate central apneas to be present. OSA AHI was 14.4/h oxygen nadir was 83% without prolonged desaturation time.   RECOMMENDATION:  This HST indicated the presence of mild OSA without significant hypoxia or REM accentuation. This form of apnea should be treated with CPAP, and given his data from home use, I would reset the auto CPAP to a setting from 6-15 cm water, 3 cm EPR and a mask of the patient's choice and comfort. Please make patient aware that he can exchange and refit for a mask within 30 days.  RV with NP after 60-90 days of use.    INTERPRETING PHYSICIAN:  Melvyn Novas, MD

## 2020-12-01 NOTE — Telephone Encounter (Signed)
I called pt. I advised pt that Dr. Vickey Huger reviewed their sleep study results and found that pt has sleep apnea. Dr. Vickey Huger recommends that pt starts auto CPAP. I reviewed PAP compliance expectations with the pt. Pt is agreeable to starting a CPAP. I advised pt that an order will be sent to a DME, [facility] and [facility] will call the pt within about one week after they file with the pt's insurance. [facility]) will show the pt how to use the machine, fit for masks, and troubleshoot the CPAP if needed. A follow up appt was made for insurance purposes with Dr. Vickey Huger on July 14,2022 at 1:30 pm. Pt verbalized understanding to arrive 15 minutes early and bring their CPAP. A letter with all of this information in it will be mailed to the pt as a reminder. I verified with the pt that the address we have on file is correct. Pt verbalized understanding of results. Pt had no questions at this time but was encouraged to call back if questions arise. I have sent the order to [facility]) and have received confirmation that they have received the order.

## 2020-12-07 ENCOUNTER — Other Ambulatory Visit: Payer: Self-pay | Admitting: Internal Medicine

## 2020-12-16 DIAGNOSIS — R351 Nocturia: Secondary | ICD-10-CM | POA: Diagnosis not present

## 2020-12-16 DIAGNOSIS — N401 Enlarged prostate with lower urinary tract symptoms: Secondary | ICD-10-CM | POA: Diagnosis not present

## 2020-12-26 ENCOUNTER — Telehealth: Payer: Medicare PPO

## 2021-01-04 ENCOUNTER — Other Ambulatory Visit: Payer: Self-pay | Admitting: Internal Medicine

## 2021-02-01 ENCOUNTER — Other Ambulatory Visit: Payer: Self-pay | Admitting: Interventional Cardiology

## 2021-02-15 NOTE — Progress Notes (Signed)
Cardiology Office Note:    Date:  02/16/2021   ID:  Ronnie Boyd, DOB [birth year], MRN [id number]  PCP:  Pincus Sanes, MD  Cardiologist:  Lesleigh Noe, MD   Referring MD: Pincus Sanes, MD   Chief Complaint  Patient presents with   Coronary Artery Disease   Cardiac Valve Problem    Aortic stenosis     History of Present Illness:    Ronnie Boyd is a 87 y.o. male with a hx of severe aortic stenosis documented by recent echo, CAD with stents in the mid LAD, distal LAD, and ramus intermedius, hypertension, diabetes II, peripheral arterial disease, and obstructive sleep apnea.  Musculoskeletal discomfort statin intolerance to Lipitor, pravastatin, and Crestor.  He is having dyspnea on exertion.  Chest gets tight on occasion if he walks too fast.  He has not had syncope.  He is having some lower extremity swelling.  He denies orthopnea.  Past Medical History:  Diagnosis Date   Allergic rhinitis    Aortic stenosis    mild echo 7/14   Borderline type 2 diabetes mellitus    Coronary artery disease    Degenerative joint disease    Family history of adverse reaction to anesthesia    "daughter had problems when she was a teen; none since; it was related to asthma"   GERD (gastroesophageal reflux disease)    Hemorrhoids    History of hiatal hernia    "dx'd years ago" (06/01/2015)   Hypercholesteremia    Hypertension    OSA on CPAP since 05/23/2015   Prostatitis    hx of   PVD (peripheral vascular disease) (HCC)    Stroke [facility])     Past Surgical History:  Procedure Laterality Date   CARDIAC CATHETERIZATION [name] 03/18/2015   Procedure: Left Heart Cath and Coronary Angiography;  Surgeon: Laurey Morale, MD;  Location: [facility] INVASIVE CV LAB;  Service: Cardiovascular;  Laterality: [name];   CARDIAC CATHETERIZATION [name] 06/01/2015   Procedure: Coronary Stent Intervention;  Surgeon: Iran Ouch, MD;  Location: [facility] INVASIVE CV LAB;  Service: Cardiovascular;  Laterality: [name];    CARDIAC CATHETERIZATION  11/11/2015   CARDIAC CATHETERIZATION [name] 11/11/2015   Procedure: Intravascular Pressure Wire/FFR Study;  Surgeon: Tonny Bollman, MD;  Location: [facility] INVASIVE CV LAB;  Service: Cardiovascular;  Laterality: [name];   CARPAL TUNNEL RELEASE Bilateral 2006-2007   CATARACT EXTRACTION W/ INTRAOCULAR LENS  IMPLANT, BILATERAL Bilateral ~ 2003   EXCISIONAL HEMORRHOIDECTOMY  1989    Current Medications: Current Meds  Medication Sig   albuterol (PROVENTIL HFA;VENTOLIN HFA) 108 (90 Base) MCG/ACT inhaler Inhale 2 puffs into the lungs every 6 (six) hours as needed for wheezing or shortness of breath.   amLODipine (NORVASC) 5 MG tablet TAKE 1 TABLET BY MOUTH EVERY DAY   aspirin 81 MG chewable tablet Chew by mouth daily.   clopidogrel (PLAVIX) 75 MG tablet TAKE 1 TABLET BY MOUTH EVERY DAY   Coenzyme Q10 200 MG capsule Take 200 mg by mouth daily.    fluticasone (FLONASE) 50 MCG/ACT nasal spray Place 2 sprays into both nostrils daily.   Garlic Oil 1000 MG CAPS Take 1 capsule by mouth daily.   isosorbide mononitrate (IMDUR) 60 MG 24 hr tablet TAKE 1 TABLET BY MOUTH EVERY DAY   loratadine (CLARITIN) 10 MG tablet Take 10 mg by mouth daily as needed for rhinitis.    losartan-hydrochlorothiazide (HYZAAR) 50-12.5 MG tablet TAKE 1 TABLET BY MOUTH EVERY DAY  metoprolol tartrate (LOPRESSOR) 25 MG tablet TAKE 1 TABLET BY MOUTH TWICE A DAY   Multiple Vitamins-Minerals (CENTRUM SILVER) tablet Take 1 tablet by mouth daily.   nitroGLYCERIN (NITROSTAT) 0.4 MG SL tablet PLACE 1 TABLET (0.4 MG TOTAL) UNDER THE TONGUE EVERY 5 (FIVE) MINUTES AS NEEDED FOR CHEST PAIN.   Omega-3 Fatty Acids (FISH OIL) 1000 MG CAPS Take 1 capsule by mouth daily.   pantoprazole (PROTONIX) 40 MG tablet TAKE 1 TABLET (40 MG TOTAL) BY MOUTH DAILY.   ranolazine (RANEXA) 500 MG 12 hr tablet Take 1 tablet (500 mg total) by mouth 2 (two) times daily.   rosuvastatin (CRESTOR) 5 MG tablet TAKE 1 TABLET (5 MG TOTAL) BY MOUTH EVERY MONDAY,  WEDNESDAY, AND FRIDAY.   tamsulosin (FLOMAX) 0.4 MG CAPS capsule Take 0.4 mg by mouth daily.     Allergies:   Crestor [rosuvastatin calcium], Lipitor [atorvastatin], Other, and Pravastatin   Social History   Socioeconomic History   Marital status: Married    Spouse name: Not on file   Number of children: Not on file   Years of education: Not on file   Highest education level: Not on file  Occupational History   Occupation: retired  Tobacco Use   Smoking status: Never   Smokeless tobacco: Never  Vaping Use   Vaping Use: Never used  Substance and Sexual Activity   Alcohol use: No   Drug use: No   Sexual activity: Not Currently  Other Topics Concern   Not on file  Social History Narrative   Daughter is Mrs. Greer PickerelMcMillan Psychologist, sport and exercise(ACES director at [employer])   Social Determinants of Corporate investment bankerHealth   Financial Resource Strain: Low Risk    Difficulty of Paying Living Expenses: Not hard at all  Food Insecurity: No Food Insecurity   Worried About Programme researcher, broadcasting/film/videounning Out of Food in the Last Year: Never true   Baristaan Out of Food in the Last Year: Never true  Transportation Needs: No Transportation Needs   Lack of Transportation (Medical): No   Lack of Transportation (Non-Medical): No  Physical Activity: Sufficiently Active   Days of Exercise per Week: 5 days   Minutes of Exercise per Session: 30 min  Stress: No Stress Concern Present   Feeling of Stress : Not at all  Social Connections: Socially Integrated   Frequency of Communication with Friends and Family: More than three times a week   Frequency of Social Gatherings with Friends and Family: More than three times a week   Attends Religious Services: More than 4 times per year   Active Member of [employer] FinancialClubs or Organizations: Yes   Attends Engineer, structuralClub or Organization Meetings: More than 4 times per year   Marital Status: Married     Family History: The patient's family history includes Arthritis in an other family member; Diabetes in his brother; Diabetes  (age of onset: 4781) in his mother; Hypertension in his mother; Stroke (age of onset: 3741) in his father.  ROS:   Please see the history of present illness.    Not as vigorous as before because he feels restricted due to shortness of breath.  All other systems reviewed and are negative.  EKGs/Labs/Other Studies Reviewed:    The following studies were reviewed today:  2D Doppler echocardiogram 04/08/2019: IMPRESSIONS   1. The left ventricle has hyperdynamic systolic function, with an  ejection fraction of >65%. The cavity size was normal. There is moderately  increased left ventricular wall thickness. Left ventricular diastolic  Doppler parameters are consistent  with  impaired relaxation. Elevated left atrial and left ventricular  end-diastolic pressures The E/e' is >20. No evidence of left ventricular  regional wall motion abnormalities.   2. The right ventricle has normal systolic function. The cavity was  normal. There is no increase in right ventricular wall thickness.   3. Left atrial size was mildly dilated.   4. The mitral valve is abnormal. Mild thickening of the mitral valve  leaflet. There is mild mitral annular calcification present.   5. The tricuspid valve is grossly normal.   6. The aortic valve has an indeterminate number of cusps. Severe  calcifcation of the aortic valve. Aortic valve regurgitation is mild by  color flow Doppler. Severe stenosis of the aortic valve.   7. The aorta is normal in size and structure.    EKG:  EKG normal sinus rhythm, left axis deviation.  Compared to the prior tracing performed 02/12/2020, no changes noted.  Recent Labs: 05/17/2020: ALT 12 08/19/2020: BUN 21; Creatinine, Ser 1.43; Hemoglobin 14.1; Platelets 152.0; Potassium 4.6; Sodium 137  Recent Lipid Panel    Component Value Date/Time   CHOL 195 05/17/2020 1331   CHOL 164 04/04/2015 1059   TRIG 51 05/17/2020 1331   TRIG 42 04/04/2015 1059   HDL 81 05/17/2020 1331   HDL 82 04/04/2015  1059   CHOLHDL 2.4 05/17/2020 1331   CHOLHDL 3 02/17/2020 1255   VLDL 9.0 02/17/2020 1255   LDLCALC 104 (H) 05/17/2020 1331   LDLCALC 74 04/04/2015 1059   LDLDIRECT 131.5 11/06/2011 1057    Physical Exam:    VS:  BP (!) 142/66   Pulse 75   Ht 5\' 5"  (1.651 m)   Wt 166 lb (75.3 kg)   SpO2 96%   BMI 27.62 kg/m     Wt Readings from Last 3 Encounters:  02/16/21 166 lb (75.3 kg)  11/01/20 174 lb (78.9 kg)  08/19/20 178 lb (80.7 kg)     GEN: He appearing for his age and slightly overweight. No acute distress HEENT: Normal NECK: No JVD. LYMPHATICS: No lymphadenopathy CARDIAC: 4/6 crescendo decrescendo systolic murmur. RRR S4 gallop. VASCULAR: 2+ bilateral ankle and shin edema normal Pulses. No bruits. RESPIRATORY:  Clear to auscultation without rales, wheezing or rhonchi  ABDOMEN: Soft, non-tender, non-distended, No pulsatile mass, MUSCULOSKELETAL: No deformity  SKIN: Warm and dry NEUROLOGIC:  Alert and oriented x 3 PSYCHIATRIC:  Normal affect   ASSESSMENT:    1. Nonrheumatic aortic valve stenosis   2. Essential hypertension   3. Coronary artery disease involving native coronary artery of native heart without angina pectoris   4. OSA on CPAP   5. Peripheral vascular disease (HCC)   6. HYPERCHOLESTEROLEMIA   7. SOB (shortness of breath)    PLAN:    In order of problems listed above:  Severe aortic stenosis with diastolic heart failure.  Lower extremity swelling, exertional dyspnea, related to progression of aortic stenosis.  Again we had a conversation concerning management strategy.  Open heart surgery is off the table as far as the patient is concerned.  He has not said no to TAVR but he feels ambivalent and leaning towards conservative therapy.  He certainly is not anxious to begin any kind of work-up for TAVR.  Today I will check a BNP, hemoglobin, and creatinine.  If that BNP is elevated, I will add low-dose Lasix.  I did explain to him that attempting diuresis may be  hazardous given the valve. Blood pressure is adequate for  age but in the setting of aortic stenosis, intracardiac pressures are probably quite high. Angina is tolerable. He wears CPAP. Denies claudication. Continue Crestor 5 mg/day.  Guideline directed therapy for left ventricular systolic dysfunction: Angiotensin receptor-neprilysin inhibitor (ARNI)-Entresto; beta-blocker therapy - carvedilol, metoprolol succinate, or bisoprolol; mineralocorticoid receptor antagonist (MRA) therapy -spironolactone or eplerenone.  SGLT-2 agents -  Dapagliflozin Marcelline Deist) or Empagliflozin (Jardiance).These therapies have been shown to improve clinical outcomes including reduction of rehospitalization, survival, and acute heart failure.  If BNP is significantly elevated, we will consider doing an echo to rule out systolic dysfunction and reassess severity of aortic stenosis which we already knew was severe.  Without a definite commitment for TAVR we have decided to not him but if we are not going to do anything with the information.  Medication Adjustments/Labs and Tests Ordered: Current medicines are reviewed at length with the patient today.  Concerns regarding medicines are outlined above.  Orders Placed This Encounter  Procedures   CBC   Basic metabolic panel   Pro b natriuretic peptide   EKG 12-Lead   No orders of the defined types were placed in this encounter.   Patient Instructions  Medication Instructions:  Your physician recommends that you continue on your current medications as directed. Please refer to the Current Medication list given to you today.  *If you need a refill on your cardiac medications before your next appointment, please call your pharmacy*   Lab Work: BMET, CBC and Pro BNP today  If you have labs (blood work) drawn today and your tests are completely normal, you will receive your results only by: MyChart Message (if you have MyChart) OR A paper copy in the mail If you  have any lab test that is abnormal or we need to change your treatment, we will call you to review the results.   Testing/Procedures: None   Follow-Up: At [facility], you and your health needs are our priority.  As part of our continuing mission to provide you with exceptional heart care, we have created designated Provider Care Teams.  These Care Teams include your primary Cardiologist (physician) and Advanced Practice Providers (APPs -  Physician Assistants and Nurse Practitioners) who all work together to provide you with the care you need, when you need it.  We recommend signing up for the patient portal called "MyChart".  Sign up information is provided on this After Visit Summary.  MyChart is used to connect with patients for Virtual Visits (Telemedicine).  Patients are able to view lab/test results, encounter notes, upcoming appointments, etc.  Non-urgent messages can be sent to your provider as well.   To learn more about what you can do with MyChart, go to [email or web address].    Your next appointment:   1 year(s)  The format for your next appointment:   In Person  Provider:   You may see Lesleigh Noe, MD or one of the following Advanced Practice Providers on your designated Care Team:   Georgie Chard, NP   Other Instructions   Signed, Lesleigh Noe, MD  02/16/2021 4:51 PM    [facility]

## 2021-02-16 ENCOUNTER — Other Ambulatory Visit: Payer: Self-pay

## 2021-02-16 ENCOUNTER — Encounter: Payer: Self-pay | Admitting: Interventional Cardiology

## 2021-02-16 ENCOUNTER — Ambulatory Visit: Payer: Medicare PPO | Admitting: Interventional Cardiology

## 2021-02-16 VITALS — BP 142/66 | HR 75 | Ht 65.0 in | Wt 166.0 lb

## 2021-02-16 DIAGNOSIS — E7800 Pure hypercholesterolemia, unspecified: Secondary | ICD-10-CM

## 2021-02-16 DIAGNOSIS — I2510 Atherosclerotic heart disease of native coronary artery without angina pectoris: Secondary | ICD-10-CM

## 2021-02-16 DIAGNOSIS — I739 Peripheral vascular disease, unspecified: Secondary | ICD-10-CM

## 2021-02-16 DIAGNOSIS — G4733 Obstructive sleep apnea (adult) (pediatric): Secondary | ICD-10-CM

## 2021-02-16 DIAGNOSIS — I10 Essential (primary) hypertension: Secondary | ICD-10-CM

## 2021-02-16 DIAGNOSIS — I350 Nonrheumatic aortic (valve) stenosis: Secondary | ICD-10-CM | POA: Diagnosis not present

## 2021-02-16 DIAGNOSIS — Z9989 Dependence on other enabling machines and devices: Secondary | ICD-10-CM

## 2021-02-16 DIAGNOSIS — R0602 Shortness of breath: Secondary | ICD-10-CM

## 2021-02-16 NOTE — Progress Notes (Signed)
Subjective:    Patient ID: Ronnie Boyd, male    DOB: 25-May-1927, 87 y.o.   MRN: [id number]  HPI The patient is here for follow up of their chronic medical problems, including dm, CAD, htn, leg edema, h/o CVA, hld, GERD, OSA on cpap  He is walking some for exercise.   He has swelling in his legs per the past week.  His SOB is little worse.   Leg cramping - typically the days he walks.  Has PAD.    BNP elevated yesterday - will be started on lasix 20 mg by cardio.  Discussed weighing himself daily.  Medications and allergies reviewed with patient and updated if appropriate.  Patient Active Problem List   Diagnosis Date Noted   History of DVT of lower extremity 06/12/2017   Bilateral leg edema 05/08/2017   Coronary artery disease involving native coronary artery    Diabetes (HCC) 11/09/2015   OSA on CPAP 07/20/2015   History of stroke, lacunar 2016 03/24/2015   Aortic stenosis, severe  01/08/2011   HYPERCHOLESTEROLEMIA 12/01/2007   Essential hypertension 12/01/2007   Peripheral vascular disease (HCC) 12/01/2007   HEMORRHOIDS 12/01/2007   Allergic rhinitis 12/01/2007   GERD 12/01/2007   DEGENERATIVE JOINT DISEASE 12/01/2007    Current Outpatient Medications on File Prior to Visit  Medication Sig Dispense Refill   albuterol (PROVENTIL HFA;VENTOLIN HFA) 108 (90 Base) MCG/ACT inhaler Inhale 2 puffs into the lungs every 6 (six) hours as needed for wheezing or shortness of breath. 1 Inhaler 0   amLODipine (NORVASC) 5 MG tablet TAKE 1 TABLET BY MOUTH EVERY DAY 90 tablet 0   aspirin 81 MG chewable tablet Chew by mouth daily.     clopidogrel (PLAVIX) 75 MG tablet TAKE 1 TABLET BY MOUTH EVERY DAY 90 tablet 3   Coenzyme Q10 200 MG capsule Take 200 mg by mouth daily.      fluticasone (FLONASE) 50 MCG/ACT nasal spray Place 2 sprays into both nostrils daily. 16 g 0   Garlic Oil 1000 MG CAPS Take 1 capsule by mouth daily.     isosorbide mononitrate (IMDUR) 60 MG 24 hr tablet TAKE 1  TABLET BY MOUTH EVERY DAY 90 tablet 2   loratadine (CLARITIN) 10 MG tablet Take 10 mg by mouth daily as needed for rhinitis.      losartan-hydrochlorothiazide (HYZAAR) 50-12.5 MG tablet TAKE 1 TABLET BY MOUTH EVERY DAY 90 tablet 0   metoprolol tartrate (LOPRESSOR) 25 MG tablet TAKE 1 TABLET BY MOUTH TWICE A DAY 180 tablet 2   Multiple Vitamins-Minerals (CENTRUM SILVER) tablet Take 1 tablet by mouth daily.     nitroGLYCERIN (NITROSTAT) 0.4 MG SL tablet PLACE 1 TABLET (0.4 MG TOTAL) UNDER THE TONGUE EVERY 5 (FIVE) MINUTES AS NEEDED FOR CHEST PAIN. 25 tablet 3   Omega-3 Fatty Acids (FISH OIL) 1000 MG CAPS Take 1 capsule by mouth daily.     pantoprazole (PROTONIX) 40 MG tablet TAKE 1 TABLET (40 MG TOTAL) BY MOUTH DAILY. 30 tablet 11   ranolazine (RANEXA) 500 MG 12 hr tablet Take 1 tablet (500 mg total) by mouth 2 (two) times daily. 180 tablet 3   rosuvastatin (CRESTOR) 5 MG tablet TAKE 1 TABLET (5 MG TOTAL) BY MOUTH EVERY MONDAY, WEDNESDAY, AND FRIDAY. 45 tablet 3   tamsulosin (FLOMAX) 0.4 MG CAPS capsule Take 0.4 mg by mouth daily.     No current facility-administered medications on file prior to visit.    Past Medical History:  Diagnosis  Date   Allergic rhinitis    Aortic stenosis    mild echo 7/14   Borderline type 2 diabetes mellitus    Coronary artery disease    Degenerative joint disease    Family history of adverse reaction to anesthesia    "daughter had problems when she was a teen; none since; it was related to asthma"   GERD (gastroesophageal reflux disease)    Hemorrhoids    History of hiatal hernia    "dx'd years ago" (06/01/2015)   Hypercholesteremia    Hypertension    OSA on CPAP since 05/23/2015   Prostatitis    hx of   PVD (peripheral vascular disease) (HCC)    Stroke [facility])     Past Surgical History:  Procedure Laterality Date   CARDIAC CATHETERIZATION [name] 03/18/2015   Procedure: Left Heart Cath and Coronary Angiography;  Surgeon: Laurey Morale, MD;  Location: [facility]  INVASIVE CV LAB;  Service: Cardiovascular;  Laterality: [name];   CARDIAC CATHETERIZATION [name] 06/01/2015   Procedure: Coronary Stent Intervention;  Surgeon: Iran Ouch, MD;  Location: [facility] INVASIVE CV LAB;  Service: Cardiovascular;  Laterality: [name];   CARDIAC CATHETERIZATION  11/11/2015   CARDIAC CATHETERIZATION [name] 11/11/2015   Procedure: Intravascular Pressure Wire/FFR Study;  Surgeon: Tonny Bollman, MD;  Location: [facility] INVASIVE CV LAB;  Service: Cardiovascular;  Laterality: [name];   CARPAL TUNNEL RELEASE Bilateral 2006-2007   CATARACT EXTRACTION W/ INTRAOCULAR LENS  IMPLANT, BILATERAL Bilateral ~ 2003   EXCISIONAL HEMORRHOIDECTOMY  1989    Social History   Socioeconomic History   Marital status: Married    Spouse name: Not on file   Number of children: Not on file   Years of education: Not on file   Highest education level: Not on file  Occupational History   Occupation: retired  Tobacco Use   Smoking status: Never   Smokeless tobacco: Never  Vaping Use   Vaping Use: Never used  Substance and Sexual Activity   Alcohol use: No   Drug use: No   Sexual activity: Not Currently  Other Topics Concern   Not on file  Social History Narrative   Daughter is [name] Psychologist, sport and exercise at [employer])   Social Determinants of Corporate investment banker Strain: Low Risk    Difficulty of Paying Living Expenses: Not hard at all  Food Insecurity: No Food Insecurity   Worried About Programme researcher, broadcasting/film/video in the Last Year: Never true   Barista in the Last Year: Never true  Transportation Needs: No Transportation Needs   Lack of Transportation (Medical): No   Lack of Transportation (Non-Medical): No  Physical Activity: Sufficiently Active   Days of Exercise per Week: 5 days   Minutes of Exercise per Session: 30 min  Stress: No Stress Concern Present   Feeling of Stress : Not at all  Social Connections: Socially Integrated   Frequency of Communication with Friends and  Family: More than three times a week   Frequency of Social Gatherings with Friends and Family: More than three times a week   Attends Religious Services: More than 4 times per year   Active Member of [employer] or Organizations: Yes   Attends Engineer, structural: More than 4 times per year   Marital Status: Married    Family History  Problem Relation Age of Onset   Hypertension Mother    Diabetes Mother 27   Stroke Father 58   Arthritis Other  Diabetes Brother     Review of Systems  Constitutional:  Negative for fever.  Respiratory:  Positive for cough, shortness of breath (chronic - not worse) and wheezing.   Cardiovascular:  Positive for leg swelling. Negative for chest pain and palpitations.  Gastrointestinal:        Occ gerd  Neurological:  Negative for light-headedness and headaches.      Objective:   Vitals:   02/17/21 1410  BP: (!) 142/78  Pulse: 74  Temp: 98.3 F (36.8 C)  SpO2: 95%   BP Readings from Last 3 Encounters:  02/17/21 (!) 142/78  02/16/21 (!) 142/66  11/01/20 128/63   Wt Readings from Last 3 Encounters:  02/17/21 166 lb (75.3 kg)  02/16/21 166 lb (75.3 kg)  11/01/20 174 lb (78.9 kg)   Body mass index is 27.62 kg/m.   Physical Exam    Constitutional: Appears well-developed and well-nourished. No distress.  HENT:  Head: Normocephalic and atraumatic.  Neck: Neck supple. No tracheal deviation present. No thyromegaly present.  No cervical lymphadenopathy Cardiovascular: Normal rate, regular rhythm and normal heart sounds.   3/6 syst murmur at RSB heard. No carotid bruit .  1 + pitting b/l LE edema Pulmonary/Chest: Effort normal and breath sounds normal. No respiratory distress. No has no wheezes. No rales.  Skin: Skin is warm and dry. Not diaphoretic.  Psychiatric: Normal mood and affect. Behavior is normal.    Lab Results  Component Value Date   HGBA1C 5.6 02/17/2021   HGBA1C 5.6 02/17/2021   HGBA1C 5.6 (A) 02/17/2021   HGBA1C  5.6 02/17/2021     Assessment & Plan:    See Problem List for Assessment and Plan of chronic medical problems.    This visit occurred during the SARS-CoV-2 public health emergency.  Safety protocols were in place, including screening questions prior to the visit, additional usage of staff PPE, and extensive cleaning of exam room while observing appropriate contact time as indicated for disinfecting solutions.

## 2021-02-16 NOTE — Patient Instructions (Addendum)
Medication Instructions:  Your physician recommends that you continue on your current medications as directed. Please refer to the Current Medication list given to you today.  *If you need a refill on your cardiac medications before your next appointment, please call your pharmacy*   Lab Work: BMET, CBC and Pro BNP today  If you have labs (blood work) drawn today and your tests are completely normal, you will receive your results only by: MyChart Message (if you have MyChart) OR A paper copy in the mail If you have any lab test that is abnormal or we need to change your treatment, we will call you to review the results.   Testing/Procedures: None   Follow-Up: At [facility], you and your health needs are our priority.  As part of our continuing mission to provide you with exceptional heart care, we have created designated Provider Care Teams.  These Care Teams include your primary Cardiologist (physician) and Advanced Practice Providers (APPs -  Physician Assistants and Nurse Practitioners) who all work together to provide you with the care you need, when you need it.  We recommend signing up for the patient portal called "MyChart".  Sign up information is provided on this After Visit Summary.  MyChart is used to connect with patients for Virtual Visits (Telemedicine).  Patients are able to view lab/test results, encounter notes, upcoming appointments, etc.  Non-urgent messages can be sent to your provider as well.   To learn more about what you can do with MyChart, go to [email or web address].    Your next appointment:   6 months  The format for your next appointment:   In Person  Provider:   You may see Lesleigh Noe, MD or one of the following Advanced Practice Providers on your designated Care Team:   Georgie Chard, NP   Other Instructions

## 2021-02-16 NOTE — Patient Instructions (Addendum)
  Your a1c was checked today.    Medications changes include :   start pepcid 20 mg daily for a couple of week for your heartburn.      Weigh yourself daily.  Limit you salt intake.    Please followup in 6 months

## 2021-02-17 ENCOUNTER — Encounter: Payer: Self-pay | Admitting: Internal Medicine

## 2021-02-17 ENCOUNTER — Telehealth: Payer: Self-pay | Admitting: *Deleted

## 2021-02-17 ENCOUNTER — Ambulatory Visit: Payer: Medicare PPO | Admitting: Internal Medicine

## 2021-02-17 VITALS — BP 142/78 | HR 74 | Temp 98.3°F | Ht 65.0 in | Wt 166.0 lb

## 2021-02-17 DIAGNOSIS — E7800 Pure hypercholesterolemia, unspecified: Secondary | ICD-10-CM

## 2021-02-17 DIAGNOSIS — G4733 Obstructive sleep apnea (adult) (pediatric): Secondary | ICD-10-CM | POA: Diagnosis not present

## 2021-02-17 DIAGNOSIS — I5033 Acute on chronic diastolic (congestive) heart failure: Secondary | ICD-10-CM | POA: Insufficient documentation

## 2021-02-17 DIAGNOSIS — E119 Type 2 diabetes mellitus without complications: Secondary | ICD-10-CM

## 2021-02-17 DIAGNOSIS — Z9989 Dependence on other enabling machines and devices: Secondary | ICD-10-CM

## 2021-02-17 DIAGNOSIS — K219 Gastro-esophageal reflux disease without esophagitis: Secondary | ICD-10-CM

## 2021-02-17 DIAGNOSIS — R600 Localized edema: Secondary | ICD-10-CM

## 2021-02-17 DIAGNOSIS — R0602 Shortness of breath: Secondary | ICD-10-CM

## 2021-02-17 DIAGNOSIS — I10 Essential (primary) hypertension: Secondary | ICD-10-CM

## 2021-02-17 LAB — POCT GLYCOSYLATED HEMOGLOBIN (HGB A1C)
HbA1c POC (<> result, manual entry): 5.6 % (ref 4.0–5.6)
HbA1c, POC (controlled diabetic range): 5.6 % (ref 0.0–7.0)
HbA1c, POC (prediabetic range): 5.6 % — AB (ref 5.7–6.4)
Hemoglobin A1C: 5.6 % (ref 4.0–5.6)

## 2021-02-17 LAB — BASIC METABOLIC PANEL
BUN/Creatinine Ratio: 13 (ref 10–24)
BUN: 18 mg/dL (ref 10–36)
CO2: 21 mmol/L (ref 20–29)
Calcium: 9.5 mg/dL (ref 8.6–10.2)
Chloride: 105 mmol/L (ref 96–106)
Creatinine, Ser: 1.36 mg/dL — ABNORMAL HIGH (ref 0.76–1.27)
Glucose: 89 mg/dL (ref 65–99)
Potassium: 4.7 mmol/L (ref 3.5–5.2)
Sodium: 140 mmol/L (ref 134–144)
eGFR: 49 mL/min/{1.73_m2} — ABNORMAL LOW (ref >59)

## 2021-02-17 LAB — CBC
Hematocrit: 38.1 % (ref 37.5–51.0)
Hemoglobin: 11.8 g/dL — ABNORMAL LOW (ref 13.0–17.7)
MCH: 24.6 pg — ABNORMAL LOW (ref 26.6–33.0)
MCHC: 31 g/dL — ABNORMAL LOW (ref 31.5–35.7)
MCV: 80 fL (ref 79–97)
Platelets: 189 10*3/uL (ref 150–450)
RBC: 4.79 x10E6/uL (ref 4.14–5.80)
RDW: 14.4 % (ref 11.6–15.4)
WBC: 6.5 10*3/uL (ref 3.4–10.8)

## 2021-02-17 LAB — PRO B NATRIURETIC PEPTIDE: NT-Pro BNP: 3616 pg/mL — ABNORMAL HIGH (ref 0–486)

## 2021-02-17 MED ORDER — FUROSEMIDE 20 MG PO TABS: 20.0000 mg | ORAL_TABLET | Freq: Every day | ORAL | 3 refills | Status: DC

## 2021-02-17 NOTE — Assessment & Plan Note (Addendum)
Chronic Worse in past week or so BNP elevated yesterday Cardio to start lasix 20 mg daily Continue hydrochlorothiazide 12.5 mg daily

## 2021-02-17 NOTE — Assessment & Plan Note (Addendum)
Chronic GERD controlled, occ gerd Continue pantoprazole 40 mg daily

## 2021-02-17 NOTE — Assessment & Plan Note (Addendum)
Chronic Lab Results  Component Value Date   HGBA1C 6.4 08/19/2020   Controlled with diet A1c today

## 2021-02-17 NOTE — Assessment & Plan Note (Signed)
Chronic Blood pressure adequately controlled Continue amlodipine 5 mg daily, Imdur 60 mg daily, losartan-hydrochlorothiazide 50-12.5 mg daily, Lopressor 25 mg twice daily CMP

## 2021-02-17 NOTE — Assessment & Plan Note (Signed)
Chronic Using CPAP nightly 

## 2021-02-17 NOTE — Assessment & Plan Note (Signed)
Acute on chronic heart failure with preserved EF Seen by cardiology yesterday BNP elevated To be started on Lasix 20 mg daily

## 2021-02-17 NOTE — Telephone Encounter (Signed)
-----   Message from Lyn Records, MD sent at 02/17/2021  2:26 PM EDT ----- Let the patient know he does have fluid buildup in his lungs.  Start furosemide 20 mg/day.  Needs a basic metabolic panel in 1 week.  Needs a 1-2 week follow-up if possible.  The patient should weigh daily.  A copy will be sent to Pincus Sanes, MD

## 2021-02-17 NOTE — Telephone Encounter (Signed)
Spoke with pt and reviewed results and recommendations.  Pt will come for labs on 6/17 and to see Dr. Katrinka Blazing on 6/30.  Pt verbalized understanding and was in agreement with plan.

## 2021-02-17 NOTE — Assessment & Plan Note (Addendum)
Chronic Continue rosuvastatin 5 mg 3 times a week

## 2021-02-24 ENCOUNTER — Other Ambulatory Visit: Payer: Medicare PPO | Admitting: *Deleted

## 2021-02-24 ENCOUNTER — Other Ambulatory Visit: Payer: Self-pay

## 2021-02-24 DIAGNOSIS — R0602 Shortness of breath: Secondary | ICD-10-CM | POA: Diagnosis not present

## 2021-02-24 DIAGNOSIS — I10 Essential (primary) hypertension: Secondary | ICD-10-CM

## 2021-02-25 LAB — BASIC METABOLIC PANEL
BUN/Creatinine Ratio: 12 (ref 10–24)
BUN: 15 mg/dL (ref 10–36)
CO2: 21 mmol/L (ref 20–29)
Calcium: 9.1 mg/dL (ref 8.6–10.2)
Chloride: 101 mmol/L (ref 96–106)
Creatinine, Ser: 1.28 mg/dL — ABNORMAL HIGH (ref 0.76–1.27)
Glucose: 107 mg/dL — ABNORMAL HIGH (ref 65–99)
Potassium: 4.2 mmol/L (ref 3.5–5.2)
Sodium: 137 mmol/L (ref 134–144)
eGFR: 53 mL/min/{1.73_m2} — ABNORMAL LOW (ref >59)

## 2021-03-01 ENCOUNTER — Telehealth: Payer: Self-pay | Admitting: *Deleted

## 2021-03-01 ENCOUNTER — Telehealth: Payer: Self-pay | Admitting: Licensed Clinical Social Worker

## 2021-03-01 MED ORDER — FUROSEMIDE 40 MG PO TABS: 40.0000 mg | ORAL_TABLET | Freq: Every day | ORAL | 3 refills | Status: DC

## 2021-03-01 NOTE — Telephone Encounter (Signed)
Patient notified. He will have BMP done when he sees Dr Katrinka Blazing on 6/30.  He does not need new prescription sent in. Will update med list.  Patient reports for the last month he has been receiving meals on wheels once a day (dinner).  He thinks meals may have preservatives in them.

## 2021-03-01 NOTE — Telephone Encounter (Signed)
-----   Message from Lyn Records, MD sent at 02/27/2021  2:41 PM EDT ----- Let the patient know the labs look okay. Has swelling and breathing improved on Furosemide 20 mg daily? If not increase to mg daily if BP allows. A copy will be sent to Pincus Sanes, MD

## 2021-03-01 NOTE — Telephone Encounter (Signed)
Increase furosemide to 40 mg daily, check BP daily, and weigh daily. BMET in 1 week.

## 2021-03-01 NOTE — Telephone Encounter (Signed)
CSW received request from staff at [facility] office to contact patient to explore options for prepared food. Patient is currently receiving meals on wheels which is typically high is sodium and no compatible with his cardiac needs. CSW will explore referral to [employer] though [facility]. CSW left message for patient to return call. Lasandra Beech, LCSW, CCSW-MCS [phone]

## 2021-03-01 NOTE — Telephone Encounter (Signed)
Patient notified.  Shortness of breath has not improved. Same as when he recently saw Dr Katrinka Blazing. Swelling has gone down some.  Half of what it was.  Ankles are puffy.  Initially started checking BP after seeing Dr Katrinka Blazing and readings were around 142/mid 60's.  Has not checked recently.   Does not weigh daily but does not feel he has gained recently.  Was 162-164 lbs when he last weighed.  He reports he has lost weight in last 3 months.  I asked patient to start checking BP and weigh daily. Will forward to Dr Katrinka Blazing to see if patient should increase lasix and to what dose. Patient aware we will call back if any changes needed prior to appointment on 6/30

## 2021-03-01 NOTE — Telephone Encounter (Signed)
I spoke with Lasandra Beech (social worker) and patient will qualify for heart healthy mom's meals in place of meals on wheels.  She will contact patient to arrange

## 2021-03-02 ENCOUNTER — Telehealth: Payer: Self-pay | Admitting: Licensed Clinical Social Worker

## 2021-03-02 NOTE — Telephone Encounter (Signed)
CSW received referral to assist patient with healthier meals as he is currently receiving Meals on Wheels which are high in sodium. Patient has had an increase in fluid build up since starting the meals. CSW discussed referral to [facility] for [employer] delivery which are lower sodium meals and might help with healthier eating. Patient very interested and grateful for the support. Patient's wife also in need of the meals as well due to an increase in her BP since starting Meals on Wheels. CSW will refer both patient and his wife to [facility] for ongoing low sodium meals from [employer]. Lasandra Beech, LCSW, CCSW-MCS [phone]

## 2021-03-08 NOTE — Progress Notes (Signed)
Cardiology Office Note:    Date:  03/09/2021   ID:  Ronnie Boyd, DOB 25-Jul-1927, MRN [id number]  PCP:  Pincus Sanes, MD  Cardiologist:  Lesleigh Noe, MD   Referring MD: Pincus Sanes, MD   No chief complaint on file.   History of Present Illness:    Ronnie Boyd is a 87 y.o. male with a hx of severe aortic stenosis documented by recent echo, CAD with stents in the mid LAD, distal LAD, and ramus intermedius, hypertension, diabetes II, peripheral arterial disease, obstructive sleep apnea, and acute on chronic diastolic HF6/05/2021.  Musculoskeletal discomfort statin intolerance to Lipitor, pravastatin, and Crestor.   After increasing furosemide to 40 mg/day, lower extremity swelling and dyspnea on exertion have improved.  He is accompanied by his daughter today.  We had a prolonged conversation today concerning goals of care.  With the daughter present, we had a conversation concerning goals of care.  The patient definitively states that he prefers conservative medical management.  I talked about the pros and cons of having hospice involved to establish a peaceful transition whenever that may be.  He now has heart failure in the setting of critical aortic stenosis and does not want invasive management.  I have advised against CPR and advanced life support.  He will consider this and discuss with his family.  Past Medical History:  Diagnosis Date   Allergic rhinitis    Aortic stenosis    mild echo 7/14   Borderline type 2 diabetes mellitus    Coronary artery disease    Degenerative joint disease    Family history of adverse reaction to anesthesia    "daughter had problems when she was a teen; none since; it was related to asthma"   GERD (gastroesophageal reflux disease)    Hemorrhoids    History of hiatal hernia    "dx'd years ago" (06/01/2015)   Hypercholesteremia    Hypertension    OSA on CPAP since 05/23/2015   Prostatitis    hx of   PVD (peripheral vascular  disease) (HCC)    Stroke [facility])     Past Surgical History:  Procedure Laterality Date   CARDIAC CATHETERIZATION [name] 03/18/2015   Procedure: Left Heart Cath and Coronary Angiography;  Surgeon: Laurey Morale, MD;  Location: [facility] INVASIVE CV LAB;  Service: Cardiovascular;  Laterality: [name];   CARDIAC CATHETERIZATION [name] 06/01/2015   Procedure: Coronary Stent Intervention;  Surgeon: Iran Ouch, MD;  Location: [facility] INVASIVE CV LAB;  Service: Cardiovascular;  Laterality: [name];   CARDIAC CATHETERIZATION  11/11/2015   CARDIAC CATHETERIZATION [name] 11/11/2015   Procedure: Intravascular Pressure Wire/FFR Study;  Surgeon: Tonny Bollman, MD;  Location: [facility] INVASIVE CV LAB;  Service: Cardiovascular;  Laterality: [name];   CARPAL TUNNEL RELEASE Bilateral 2006-2007   CATARACT EXTRACTION W/ INTRAOCULAR LENS  IMPLANT, BILATERAL Bilateral ~ 2003   EXCISIONAL HEMORRHOIDECTOMY  1989    Current Medications: Current Meds  Medication Sig   albuterol (PROVENTIL HFA;VENTOLIN HFA) 108 (90 Base) MCG/ACT inhaler Inhale 2 puffs into the lungs every 6 (six) hours as needed for wheezing or shortness of breath.   amLODipine (NORVASC) 5 MG tablet TAKE 1 TABLET BY MOUTH EVERY DAY   aspirin 81 MG chewable tablet Chew by mouth daily.   clopidogrel (PLAVIX) 75 MG tablet TAKE 1 TABLET BY MOUTH EVERY DAY   Coenzyme Q10 200 MG capsule Take 200 mg by mouth daily.    fluticasone (FLONASE) 50 MCG/ACT nasal spray  Place 2 sprays into both nostrils daily.   furosemide (LASIX) 40 MG tablet Take 1 tablet (40 mg total) by mouth daily.   Garlic Oil 1000 MG CAPS Take 1 capsule by mouth daily.   isosorbide mononitrate (IMDUR) 60 MG 24 hr tablet TAKE 1 TABLET BY MOUTH EVERY DAY   loratadine (CLARITIN) 10 MG tablet Take 10 mg by mouth daily as needed for rhinitis.    losartan-hydrochlorothiazide (HYZAAR) 50-12.5 MG tablet TAKE 1 TABLET BY MOUTH EVERY DAY   metoprolol tartrate (LOPRESSOR) 25 MG tablet TAKE 1 TABLET BY MOUTH TWICE A DAY   Multiple  Vitamins-Minerals (CENTRUM SILVER) tablet Take 1 tablet by mouth daily.   nitroGLYCERIN (NITROSTAT) 0.4 MG SL tablet PLACE 1 TABLET (0.4 MG TOTAL) UNDER THE TONGUE EVERY 5 (FIVE) MINUTES AS NEEDED FOR CHEST PAIN.   Omega-3 Fatty Acids (FISH OIL) 1000 MG CAPS Take 1 capsule by mouth daily.   pantoprazole (PROTONIX) 40 MG tablet TAKE 1 TABLET (40 MG TOTAL) BY MOUTH DAILY.   ranolazine (RANEXA) 500 MG 12 hr tablet Take 1 tablet (500 mg total) by mouth 2 (two) times daily.   rosuvastatin (CRESTOR) 5 MG tablet TAKE 1 TABLET (5 MG TOTAL) BY MOUTH EVERY MONDAY, WEDNESDAY, AND FRIDAY.   tamsulosin (FLOMAX) 0.4 MG CAPS capsule Take 0.4 mg by mouth daily.     Allergies:   Crestor [rosuvastatin calcium], Lipitor [atorvastatin], Other, and Pravastatin   Social History   Socioeconomic History   Marital status: Married    Spouse name: Not on file   Number of children: Not on file   Years of education: Not on file   Highest education level: Not on file  Occupational History   Occupation: retired  Tobacco Use   Smoking status: Never   Smokeless tobacco: Never  Vaping Use   Vaping Use: Never used  Substance and Sexual Activity   Alcohol use: No   Drug use: No   Sexual activity: Not Currently  Other Topics Concern   Not on file  Social History Narrative   Daughter is Mrs. Greer PickerelMcMillan Psychologist, sport and exercise(ACES director at [employer])   Social Determinants of Corporate investment bankerHealth   Financial Resource Strain: Low Risk    Difficulty of Paying Living Expenses: Not hard at all  Food Insecurity: No Food Insecurity   Worried About Programme researcher, broadcasting/film/videounning Out of Food in the Last Year: Never true   Baristaan Out of Food in the Last Year: Never true  Transportation Needs: No Transportation Needs   Lack of Transportation (Medical): No   Lack of Transportation (Non-Medical): No  Physical Activity: Sufficiently Active   Days of Exercise per Week: 5 days   Minutes of Exercise per Session: 30 min  Stress: No Stress Concern Present   Feeling of Stress  : Not at all  Social Connections: Socially Integrated   Frequency of Communication with Friends and Family: More than three times a week   Frequency of Social Gatherings with Friends and Family: More than three times a week   Attends Religious Services: More than 4 times per year   Active Member of [employer] Financial[employer]ubs or Organizations: Yes   Attends Engineer, structuralClub or Organization Meetings: More than 4 times per year   Marital Status: Married     Family History: The patient's family history includes Arthritis in an other family member; Diabetes in his brother; Diabetes (age of onset: 5081) in his mother; Hypertension in his mother; Stroke (age of onset: 5841) in his father.  ROS:   Please see the  history of present illness.    No new therapy.  No angina.  No syncope.  All other systems reviewed and are negative.  EKGs/Labs/Other Studies Reviewed:    The following studies were reviewed today:  ECHOCARDIOGRAM 04/08/2019: IMPRESSIONS  1. The left ventricle has hyperdynamic systolic function, with an  ejection fraction of >65%. The cavity size was normal. There is moderately  increased left ventricular wall thickness. Left ventricular diastolic  Doppler parameters are consistent with  impaired relaxation. Elevated left atrial and left ventricular  end-diastolic pressures The E/e' is >20. No evidence of left ventricular  regional wall motion abnormalities.   2. The right ventricle has normal systolic function. The cavity was  normal. There is no increase in right ventricular wall thickness.   3. Left atrial size was mildly dilated.   4. The mitral valve is abnormal. Mild thickening of the mitral valve  leaflet. There is mild mitral annular calcification present.   5. The tricuspid valve is grossly normal.   6. The aortic valve has an indeterminate number of cusps. Severe  calcifcation of the aortic valve. Aortic valve regurgitation is mild by  color flow Doppler. Severe stenosis of the aortic valve.   7. The  aorta is normal in size and structure.   EKG:  EKG not repeated  Recent Labs: 05/17/2020: ALT 12 02/16/2021: Hemoglobin 11.8; NT-Pro BNP 3,616; Platelets 189 02/24/2021: BUN 15; Creatinine, Ser 1.28; Potassium 4.2; Sodium 137  Recent Lipid Panel    Component Value Date/Time   CHOL 195 05/17/2020 1331   CHOL 164 04/04/2015 1059   TRIG 51 05/17/2020 1331   TRIG 42 04/04/2015 1059   HDL 81 05/17/2020 1331   HDL 82 04/04/2015 1059   CHOLHDL 2.4 05/17/2020 1331   CHOLHDL 3 02/17/2020 1255   VLDL 9.0 02/17/2020 1255   LDLCALC 104 (H) 05/17/2020 1331   LDLCALC 74 04/04/2015 1059   LDLDIRECT 131.5 11/06/2011 1057    Physical Exam:    VS:  BP (!) 124/58   Pulse 68   Ht 5\' 5"  (1.651 m)   Wt 160 lb 12.8 oz (72.9 kg)   SpO2 97%   BMI 26.76 kg/m     Wt Readings from Last 3 Encounters:  03/09/21 160 lb 12.8 oz (72.9 kg)  02/17/21 166 lb (75.3 kg)  02/16/21 166 lb (75.3 kg)     GEN: Elderly but appears younger than stated age. No acute distress HEENT: Normal NECK: No JVD. LYMPHATICS: No lymphadenopathy CARDIAC: 4/6 crescendo decrescendo systolic murmur. RRR S4 gallop, with trace to 1+ ankle and shin edema. VASCULAR:  Normal Pulses. No bruits. RESPIRATORY:  Clear to auscultation without rales, wheezing or rhonchi  ABDOMEN: Soft, non-tender, non-distended, No pulsatile mass, MUSCULOSKELETAL: No deformity  SKIN: Warm and dry NEUROLOGIC:  Alert and oriented x 3 PSYCHIATRIC:  Normal affect   ASSESSMENT:    1. Acute on chronic diastolic HF (heart failure) (HCC)   2. Nonrheumatic aortic valve stenosis   3. Coronary artery disease involving native coronary artery of native heart without angina pectoris   4. Essential hypertension   5. OSA on CPAP   6. Peripheral vascular disease (HCC)   7. HYPERCHOLESTEROLEMIA    PLAN:    In order of problems listed above:  Improved volume status.  Still slightly volume overloaded.  Would not further increase furosemide.  We will check a basic  metabolic panel today.  Further diuretic management will be dependent upon laboratory data and clinical course.  Patient  and family understands that our current treatment plan is conservative medical care.  The patient does not want CPR.  This has not been formalized. Severe aortic stenosis, now with heart failure.  An echo was not repeated.  We will plan hospice involvement if the patient consents. Notify us if angina Blood pressure is relatively low Encouraged continued CPAP use Denies claudication.  Limited by dyspnea. Continue Lipitor 80.  Clinical follow-up in 2 months or earlier if progressive symptoms.  Further adjustment in diuretic therapy will be dependent upon clinical symptoms and laboratory data.   Medication Adjustments/Labs and Tests Ordered: Current medicines are reviewed at length with the patient today.  Concerns regarding medicines are outlined above.  Orders Placed This Encounter  Procedures   Basic metabolic panel   Pro b natriuretic peptide    No orders of the defined types were placed in this encounter.   Patient Instructions  Medication Instructions:  Your physician recommends that you continue on your current medications as directed. Please refer to the Current Medication list given to you today.  *If you need a refill on your cardiac medications before your next appointment, please call your pharmacy*   Lab Work: BMET and Pro BNP today  If you have labs (blood work) drawn today and your tests are completely normal, you will receive your results only by: MyChart Message (if you have MyChart) OR A paper copy in the mail If you have any lab test that is abnormal or we need to change your treatment, we will call you to review the results.   Testing/Procedures: None   Follow-Up: At [facility], you and your health needs are our priority.  As part of our continuing mission to provide you with exceptional heart care, we have created designated Provider  Care Teams.  These Care Teams include your primary Cardiologist (physician) and Advanced Practice Providers (APPs -  Physician Assistants and Nurse Practitioners) who all work together to provide you with the care you need, when you need it.  We recommend signing up for the patient portal called "MyChart".  Sign up information is provided on this After Visit Summary.  MyChart is used to connect with patients for Virtual Visits (Telemedicine).  Patients are able to view lab/test results, encounter notes, upcoming appointments, etc.  Non-urgent messages can be sent to your provider as well.   To learn more about what you can do with MyChart, go to [email or web address].    Your next appointment:   6 week(s)  The format for your next appointment:   In Person  Provider:   You may see Lesleigh Noe, MD or one of the following Advanced Practice Providers on your designated Care Team:   Georgie Chard, NP   Other Instructions     Signed, Lesleigh Noe, MD  03/09/2021 1:34 PM    [facility]

## 2021-03-09 ENCOUNTER — Other Ambulatory Visit: Payer: Self-pay

## 2021-03-09 ENCOUNTER — Ambulatory Visit: Payer: Medicare PPO | Admitting: Interventional Cardiology

## 2021-03-09 ENCOUNTER — Encounter: Payer: Self-pay | Admitting: Interventional Cardiology

## 2021-03-09 VITALS — BP 124/58 | HR 68 | Ht 65.0 in | Wt 160.8 lb

## 2021-03-09 DIAGNOSIS — I2510 Atherosclerotic heart disease of native coronary artery without angina pectoris: Secondary | ICD-10-CM

## 2021-03-09 DIAGNOSIS — Z9989 Dependence on other enabling machines and devices: Secondary | ICD-10-CM

## 2021-03-09 DIAGNOSIS — I5033 Acute on chronic diastolic (congestive) heart failure: Secondary | ICD-10-CM

## 2021-03-09 DIAGNOSIS — I350 Nonrheumatic aortic (valve) stenosis: Secondary | ICD-10-CM

## 2021-03-09 DIAGNOSIS — E7800 Pure hypercholesterolemia, unspecified: Secondary | ICD-10-CM

## 2021-03-09 DIAGNOSIS — G4733 Obstructive sleep apnea (adult) (pediatric): Secondary | ICD-10-CM

## 2021-03-09 DIAGNOSIS — I10 Essential (primary) hypertension: Secondary | ICD-10-CM

## 2021-03-09 DIAGNOSIS — I739 Peripheral vascular disease, unspecified: Secondary | ICD-10-CM | POA: Diagnosis not present

## 2021-03-09 NOTE — Patient Instructions (Signed)
Medication Instructions:  Your physician recommends that you continue on your current medications as directed. Please refer to the Current Medication list given to you today.  *If you need a refill on your cardiac medications before your next appointment, please call your pharmacy*   Lab Work: BMET and Pro BNP today  If you have labs (blood work) drawn today and your tests are completely normal, you will receive your results only by: MyChart Message (if you have MyChart) OR A paper copy in the mail If you have any lab test that is abnormal or we need to change your treatment, we will call you to review the results.   Testing/Procedures: None   Follow-Up: At [facility], you and your health needs are our priority.  As part of our continuing mission to provide you with exceptional heart care, we have created designated Provider Care Teams.  These Care Teams include your primary Cardiologist (physician) and Advanced Practice Providers (APPs -  Physician Assistants and Nurse Practitioners) who all work together to provide you with the care you need, when you need it.  We recommend signing up for the patient portal called "MyChart".  Sign up information is provided on this After Visit Summary.  MyChart is used to connect with patients for Virtual Visits (Telemedicine).  Patients are able to view lab/test results, encounter notes, upcoming appointments, etc.  Non-urgent messages can be sent to your provider as well.   To learn more about what you can do with MyChart, go to [email or web address].    Your next appointment:   6 week(s)  The format for your next appointment:   In Person  Provider:   You may see Lesleigh Noe, MD or one of the following Advanced Practice Providers on your designated Care Team:   Georgie Chard, NP   Other Instructions

## 2021-03-10 LAB — BASIC METABOLIC PANEL
BUN/Creatinine Ratio: 13 (ref 10–24)
BUN: 17 mg/dL (ref 10–36)
CO2: 24 mmol/L (ref 20–29)
Calcium: 9.1 mg/dL (ref 8.6–10.2)
Chloride: 101 mmol/L (ref 96–106)
Creatinine, Ser: 1.36 mg/dL — ABNORMAL HIGH (ref 0.76–1.27)
Glucose: 91 mg/dL (ref 65–99)
Potassium: 4 mmol/L (ref 3.5–5.2)
Sodium: 138 mmol/L (ref 134–144)
eGFR: 49 mL/min/{1.73_m2} — ABNORMAL LOW (ref >59)

## 2021-03-10 LAB — PRO B NATRIURETIC PEPTIDE: NT-Pro BNP: 2968 pg/mL — ABNORMAL HIGH (ref 0–486)

## 2021-03-10 NOTE — Progress Notes (Signed)
Pt has been made aware of normal result and verbalized understanding.  jw

## 2021-03-23 ENCOUNTER — Ambulatory Visit: Payer: Self-pay | Admitting: Neurology

## 2021-04-12 ENCOUNTER — Other Ambulatory Visit: Payer: Self-pay | Admitting: Interventional Cardiology

## 2021-04-16 ENCOUNTER — Other Ambulatory Visit: Payer: Self-pay | Admitting: Internal Medicine

## 2021-04-25 NOTE — Progress Notes (Signed)
Cardiology Office Note:    Date:  04/27/2021   ID:  Ronnie BilletWilliam G Ridgely, DOB 1927-03-10, MRN [id number]  PCP:  Pincus SanesBurns, Stacy J, MD  Cardiologist:  Lesleigh NoeHenry W Trinika Cortese III, MD   Referring MD: Pincus SanesBurns, Stacy J, MD   Chief Complaint  Patient presents with   Coronary Artery Disease   Cardiac Valve Problem     History of Present Illness:    Ronnie Boyd is a 87 y.o. male with a hx of severe aortic stenosis documented by recent echo, CAD with stents in the mid LAD, distal LAD, and ramus intermedius, hypertension, diabetes II, peripheral arterial disease, obstructive sleep apnea, and acute on chronic diastolic HF 02/16/2021.  Musculoskeletal discomfort statin intolerance to Lipitor, pravastatin, and Crestor.   His breathing improved after up titration of furosemide.  Lower extremity swelling improved.  He is able to walk a little more.  He still gets short of breath and gives out relatively quickly.  He was able to walk from the parking deck into the office without stopping.  I final recommendation concerning Lasix was for him to be on 40 mg/day.  He never received to 40 mg/day prescription in for the last 4 days has started taking 20 mg daily to not run out.  We rediscussed treatment strategy for severe aortic stenosis.  With his wife present, he confirms that conservative management is his preference.  He understands that heart failure symptoms will worsen and that there is a possibility of syncope and angina developing.  He was very gracious and thankful for advice to we have given after helping him to understand his overall prognosis.    Past Medical History:  Diagnosis Date   Allergic rhinitis    Aortic stenosis    mild echo 7/14   Borderline type 2 diabetes mellitus    Coronary artery disease    Degenerative joint disease    Family history of adverse reaction to anesthesia    "daughter had problems when she was a teen; none since; it was related to asthma"   GERD (gastroesophageal reflux  disease)    Hemorrhoids    History of hiatal hernia    "dx'd years ago" (06/01/2015)   Hypercholesteremia    Hypertension    OSA on CPAP since 05/23/2015   Prostatitis    hx of   PVD (peripheral vascular disease) (HCC)    Stroke [facility](HCC)     Past Surgical History:  Procedure Laterality Date   CARDIAC CATHETERIZATION [name] 03/18/2015   Procedure: Left Heart Cath and Coronary Angiography;  Surgeon: Laurey Moralealton S McLean, MD;  [facility];  Service: Cardiovascular;  Laterality: [name];   CARDIAC CATHETERIZATION [name] 06/01/2015   Procedure: Coronary Stent Intervention;  Surgeon: Iran OuchMuhammad A Arida, MD;  Location: [facility] INVASIVE CV LAB;  Service: Cardiovascular;  Laterality: [name];   CARDIAC CATHETERIZATION  11/11/2015   CARDIAC CATHETERIZATION [name] 11/11/2015   Procedure: Intravascular Pressure Wire/FFR Study;  Surgeon: Tonny BollmanMichael Cooper, MD;  [facility];  Service: Cardiovascular;  Laterality: [name];   CARPAL TUNNEL RELEASE Bilateral 2006-2007   CATARACT EXTRACTION W/ INTRAOCULAR LENS  IMPLANT, BILATERAL Bilateral ~ 2003   EXCISIONAL HEMORRHOIDECTOMY  1989    Current Medications: Current Meds  Medication Sig   albuterol (PROVENTIL HFA;VENTOLIN HFA) 108 (90 Base) MCG/ACT inhaler Inhale 2 puffs into the lungs every 6 (six) hours as needed for wheezing or shortness of breath.   amLODipine (NORVASC) 5 MG tablet TAKE 1 TABLET BY MOUTH EVERY  DAY   aspirin 81 MG chewable tablet Chew by mouth daily.   clopidogrel (PLAVIX) 75 MG tablet TAKE 1 TABLET BY MOUTH EVERY DAY   Coenzyme Q10 200 MG capsule Take 200 mg by mouth daily.    fluticasone (FLONASE) 50 MCG/ACT nasal spray Place 2 sprays into both nostrils daily.   Garlic Oil 1000 MG CAPS Take 1 capsule by mouth daily.   isosorbide mononitrate (IMDUR) 60 MG 24 hr tablet TAKE 1 TABLET BY MOUTH EVERY DAY   loratadine (CLARITIN) 10 MG tablet Take 10 mg by mouth daily as needed for rhinitis.    losartan-hydrochlorothiazide (HYZAAR) 50-12.5 MG  tablet TAKE 1 TABLET BY MOUTH EVERY DAY   metoprolol tartrate (LOPRESSOR) 25 MG tablet TAKE 1 TABLET BY MOUTH TWICE A DAY   Multiple Vitamins-Minerals (CENTRUM SILVER) tablet Take 1 tablet by mouth daily.   nitroGLYCERIN (NITROSTAT) 0.4 MG SL tablet PLACE 1 TABLET (0.4 MG TOTAL) UNDER THE TONGUE EVERY 5 (FIVE) MINUTES AS NEEDED FOR CHEST PAIN.   Olopatadine HCl 0.2 % SOLN    Omega-3 Fatty Acids (FISH OIL) 1000 MG CAPS Take 1 capsule by mouth daily.   pantoprazole (PROTONIX) 40 MG tablet TAKE 1 TABLET (40 MG TOTAL) BY MOUTH DAILY.   ranolazine (RANEXA) 500 MG 12 hr tablet TAKE 1 TABLET BY MOUTH TWICE A DAY   rosuvastatin (CRESTOR) 5 MG tablet TAKE 1 TABLET (5 MG TOTAL) BY MOUTH EVERY MONDAY, WEDNESDAY, AND FRIDAY.   tamsulosin (FLOMAX) 0.4 MG CAPS capsule Take 0.4 mg by mouth daily.   [DISCONTINUED] furosemide (LASIX) 40 MG tablet Take 1 tablet (40 mg total) by mouth daily.     Allergies:   Crestor [rosuvastatin calcium], Lipitor [atorvastatin], Other, and Pravastatin   Social History   Socioeconomic History   Marital status: Married    Spouse name: Not on file   Number of children: Not on file   Years of education: Not on file   Highest education level: Not on file  Occupational History   Occupation: retired  Tobacco Use   Smoking status: Never   Smokeless tobacco: Never  Vaping Use   Vaping Use: Never used  Substance and Sexual Activity   Alcohol use: No   Drug use: No   Sexual activity: Not Currently  Other Topics Concern   Not on file  Social History Narrative   Daughter is [name] Psychologist, sport and exercise at [employer])   Social Determinants of Corporate investment banker Strain: Low Risk    Difficulty of Paying Living Expenses: Not hard at all  Food Insecurity: No Food Insecurity   Worried About Programme researcher, broadcasting/film/video in the Last Year: Never true   Barista in the Last Year: Never true  Transportation Needs: No Transportation Needs   Lack of Transportation  (Medical): No   Lack of Transportation (Non-Medical): No  Physical Activity: Sufficiently Active   Days of Exercise per Week: 5 days   Minutes of Exercise per Session: 30 min  Stress: No Stress Concern Present   Feeling of Stress : Not at all  Social Connections: Socially Integrated   Frequency of Communication with Friends and Family: More than three times a week   Frequency of Social Gatherings with Friends and Family: More than three times a week   Attends Religious Services: More than 4 times per year   Active Member of [employer] or Organizations: Yes   Attends Banker Meetings: More than 4 times per year  Marital Status: Married     Family History: The patient's family history includes Arthritis in an other family member; Diabetes in his brother; Diabetes (age of onset: 28) in his mother; Hypertension in his mother; Stroke (age of onset: 35) in his father.  ROS:   Please see the history of present illness.    Ran out of 20 mg tablets and was not able to take the full 40 mg dose because he only had a 20 mg prescription.  All other systems reviewed and are negative.  EKGs/Labs/Other Studies Reviewed:    The following studies were reviewed today: No new or recent laboratory data other than the basic metabolic panel performed March 09, 2021.  EKG:  EKG not repeated  Recent Labs: 05/17/2020: ALT 12 02/16/2021: Hemoglobin 11.8; Platelets 189 03/09/2021: BUN 17; Creatinine, Ser 1.36; NT-Pro BNP 2,968; Potassium 4.0; Sodium 138  Recent Lipid Panel    Component Value Date/Time   CHOL 195 05/17/2020 1331   CHOL 164 04/04/2015 1059   TRIG 51 05/17/2020 1331   TRIG 42 04/04/2015 1059   HDL 81 05/17/2020 1331   HDL 82 04/04/2015 1059   CHOLHDL 2.4 05/17/2020 1331   CHOLHDL 3 02/17/2020 1255   VLDL 9.0 02/17/2020 1255   LDLCALC 104 (H) 05/17/2020 1331   LDLCALC 74 04/04/2015 1059   LDLDIRECT 131.5 11/06/2011 1057    Physical Exam:    VS:  BP (!) 132/58   Pulse 72    Ht 5\' 5"  (1.651 m)   Wt 161 lb (73 kg)   SpO2 97%   BMI 26.79 kg/m     Wt Readings from Last 3 Encounters:  04/27/21 161 lb (73 kg)  03/09/21 160 lb 12.8 oz (72.9 kg)  02/17/21 166 lb (75.3 kg)     GEN: Overweight and in my opinion, appears younger than stated age. No acute distress HEENT: Normal NECK: No JVD. LYMPHATICS: No lymphadenopathy CARDIAC: 4/6 crescendo decrescendo systolic murmur. RRR S4 gallop, with trace lower extremity edema. VASCULAR:  Normal Pulses. No bruits. RESPIRATORY:  Clear to auscultation without rales, wheezing or rhonchi  ABDOMEN: Soft, non-tender, non-distended, No pulsatile mass, MUSCULOSKELETAL: No deformity  SKIN: Warm and dry NEUROLOGIC:  Alert and oriented x 3 PSYCHIATRIC:  Normal affect   ASSESSMENT:    1. Severe aortic stenosis   2. Acute on chronic diastolic HF (heart failure) (HCC)   3. Coronary artery disease involving native coronary artery of native heart without angina pectoris   4. Essential hypertension   5. OSA on CPAP   6. Peripheral vascular disease (HCC)    PLAN:    In order of problems listed above:  Aortic stenosis is critical.  It is causing acute on chronic diastolic heart failure.  He has improved on 40 mg of furosemide per day.  We will continue palliative therapy with diuretics as tolerated by blood pressure.  He will have a 69-month follow-up.  Earlier if symptoms. Improved with decrease in his weight of approximately 6 to 7 pounds since furosemide was uptitrated after initially being started on 20 mg/day. Not having angina.  Continue Imdur, Plavix, Norvasc and as needed Nitrostat.  Also on Hyzaar and Lopressor.  May need to start withdrawing amlodipine and some of the other medications to allow more diuretic use. Blood pressure control is good Encouraged compliance with CPAP Did not discuss PAD.  Overall, prognosis is poor.  He prefers natural death rather than having procedures as management of critical aortic  stenosis.  He  will call us if he starts having increasing symptoms.  We have discussed hospice but he does not feel any of this warranted at this time.  He feels he may consider in the future if he becomes more limited.  41-month follow-up   Medication Adjustments/Labs and Tests Ordered: Current medicines are reviewed at length with the patient today.  Concerns regarding medicines are outlined above.  No orders of the defined types were placed in this encounter.  No orders of the defined types were placed in this encounter.   Patient Instructions  Medication Instructions:  Your physician recommends that you continue on your current medications as directed. Please refer to the Current Medication list given to you today.  *If you need a refill on your cardiac medications before your next appointment, please call your pharmacy*   Lab Work: None If you have labs (blood work) drawn today and your tests are completely normal, you will receive your results only by: MyChart Message (if you have MyChart) OR A paper copy in the mail If you have any lab test that is abnormal or we need to change your treatment, we will call you to review the results.   Testing/Procedures: None   Follow-Up: At [facility], you and your health needs are our priority.  As part of our continuing mission to provide you with exceptional heart care, we have created designated Provider Care Teams.  These Care Teams include your primary Cardiologist (physician) and Advanced Practice Providers (APPs -  Physician Assistants and Nurse Practitioners) who all work together to provide you with the care you need, when you need it.  We recommend signing up for the patient portal called "MyChart".  Sign up information is provided on this After Visit Summary.  MyChart is used to connect with patients for Virtual Visits (Telemedicine).  Patients are able to view lab/test results, encounter notes, upcoming appointments, etc.   Non-urgent messages can be sent to your provider as well.   To learn more about what you can do with MyChart, go to [email or web address].    Your next appointment:   4 month(s)  The format for your next appointment:   In Person  Provider:   You may see Lesleigh Noe, MD or one of the following Advanced Practice Providers on your designated Care Team:   Nada Boozer, NP   Other Instructions     Signed, Lesleigh Noe, MD  04/27/2021 5:29 PM    [facility]

## 2021-04-27 ENCOUNTER — Other Ambulatory Visit: Payer: Self-pay | Admitting: *Deleted

## 2021-04-27 ENCOUNTER — Encounter: Payer: Self-pay | Admitting: Interventional Cardiology

## 2021-04-27 ENCOUNTER — Other Ambulatory Visit: Payer: Self-pay

## 2021-04-27 ENCOUNTER — Ambulatory Visit: Payer: Medicare PPO | Admitting: Interventional Cardiology

## 2021-04-27 VITALS — BP 132/58 | HR 72 | Ht 65.0 in | Wt 161.0 lb

## 2021-04-27 DIAGNOSIS — I2510 Atherosclerotic heart disease of native coronary artery without angina pectoris: Secondary | ICD-10-CM

## 2021-04-27 DIAGNOSIS — I10 Essential (primary) hypertension: Secondary | ICD-10-CM | POA: Diagnosis not present

## 2021-04-27 DIAGNOSIS — I350 Nonrheumatic aortic (valve) stenosis: Secondary | ICD-10-CM

## 2021-04-27 DIAGNOSIS — I739 Peripheral vascular disease, unspecified: Secondary | ICD-10-CM

## 2021-04-27 DIAGNOSIS — G4733 Obstructive sleep apnea (adult) (pediatric): Secondary | ICD-10-CM | POA: Diagnosis not present

## 2021-04-27 DIAGNOSIS — I5033 Acute on chronic diastolic (congestive) heart failure: Secondary | ICD-10-CM | POA: Diagnosis not present

## 2021-04-27 DIAGNOSIS — Z9989 Dependence on other enabling machines and devices: Secondary | ICD-10-CM

## 2021-04-27 MED ORDER — FUROSEMIDE 40 MG PO TABS: 40.0000 mg | ORAL_TABLET | Freq: Every day | ORAL | 3 refills | Status: DC

## 2021-04-27 NOTE — Patient Instructions (Signed)
Medication Instructions:  Your physician recommends that you continue on your current medications as directed. Please refer to the Current Medication list given to you today.  *If you need a refill on your cardiac medications before your next appointment, please call your pharmacy*   Lab Work: None If you have labs (blood work) drawn today and your tests are completely normal, you will receive your results only by: MyChart Message (if you have MyChart) OR A paper copy in the mail If you have any lab test that is abnormal or we need to change your treatment, we will call you to review the results.   Testing/Procedures: None   Follow-Up: At [facility], you and your health needs are our priority.  As part of our continuing mission to provide you with exceptional heart care, we have created designated Provider Care Teams.  These Care Teams include your primary Cardiologist (physician) and Advanced Practice Providers (APPs -  Physician Assistants and Nurse Practitioners) who all work together to provide you with the care you need, when you need it.  We recommend signing up for the patient portal called "MyChart".  Sign up information is provided on this After Visit Summary.  MyChart is used to connect with patients for Virtual Visits (Telemedicine).  Patients are able to view lab/test results, encounter notes, upcoming appointments, etc.  Non-urgent messages can be sent to your provider as well.   To learn more about what you can do with MyChart, go to [email or web address].    Your next appointment:   4 month(s)  The format for your next appointment:   In Person  Provider:   You may see Henry W Smith III, MD or one of the following Advanced Practice Providers on your designated Care Team:   Laura Ingold, NP    Other Instructions   

## 2021-04-28 ENCOUNTER — Other Ambulatory Visit: Payer: Self-pay | Admitting: Interventional Cardiology

## 2021-05-13 ENCOUNTER — Other Ambulatory Visit: Payer: Self-pay | Admitting: Cardiology

## 2021-06-26 ENCOUNTER — Telehealth: Payer: Medicare PPO

## 2021-06-26 ENCOUNTER — Telehealth: Payer: Self-pay | Admitting: Pharmacist

## 2021-06-26 NOTE — Progress Notes (Deleted)
 Chronic Care Management Pharmacy Note  06/26/2021 Name:  Ronnie Boyd MRN:  [id number] DOB:  12/29/1926  Summary: ***  Recommendations/Changes made from today's visit: ***  Plan: ***   Subjective: Ronnie Boyd is an 87 y.o. year old male who is a primary patient of Burns, Stacy J, MD.  The CCM team was consulted for assistance with disease management and care coordination needs.    Engaged with patient by telephone for follow up visit in response to provider referral for pharmacy case management and/or care coordination services.   Consent to Services:  The patient was given information about Chronic Care Management services, agreed to services, and gave verbal consent prior to initiation of services.  Please see initial visit note for detailed documentation.   Patient Care Team: Burns, Stacy J, MD as PCP - General (Internal Medicine) Smith, Henry W, MD as PCP - Cardiology (Cardiology) McLean, Dalton S, MD (Cardiology) Dahlstedt, Stephen, MD (Urology) Dohmeier, Carmen, MD (Neurology) Sethi, Pramod S, MD (Neurology) Foltanski, Lindsey N, RPH as Pharmacist (Pharmacist)  Recent office visits: 02/17/21 Dr Burns OV: chronic f/u; start pepcid for heartburn; weight daily, limit salt  Recent consult visits: 04/27/21 Dr Smith (cardiology): f/u aortic stenosis - causing acute diastolic HF; sx improved with furosemide up titration; consider withdrawing amlodipine in future to allow for more diuretic use  03/09/21 Dr Smith (cardiology): acute on chronic HF, improved with furosemide;  02/16/21 Dr Smith (cardiology): BNP elevated, started lasix 20 mg/d. F/u 1 week for BMP.  Hospital visits: None in previous 6 months   Objective:  Lab Results  Component Value Date   CREATININE 1.36 (H) 03/09/2021   BUN 17 03/09/2021   GFR 42.98 (L) 08/19/2020   GFRNONAA 44 (L) 10/29/2016   GFRAA 51 (L) 10/29/2016   NA 138 03/09/2021   K 4.0 03/09/2021   CALCIUM 9.1 03/09/2021   CO2 24  03/09/2021   GLUCOSE 91 03/09/2021    Lab Results  Component Value Date/Time   HGBA1C 5.6 02/17/2021 03:25 PM   HGBA1C 5.6 02/17/2021 03:25 PM   HGBA1C 5.6 (A) 02/17/2021 03:25 PM   HGBA1C 5.6 02/17/2021 03:25 PM   HGBA1C 6.4 08/19/2020 03:52 PM   HGBA1C 7.2 (H) 02/17/2020 12:55 PM   GFR 42.98 (L) 08/19/2020 03:52 PM   GFR 55.25 (L) 02/17/2020 12:55 PM    Last diabetic Eye exam:  Lab Results  Component Value Date/Time   HMDIABEYEEXA No Retinopathy 09/17/2017 12:00 AM    Last diabetic Foot exam: No results found for: HMDIABFOOTEX   Lab Results  Component Value Date   CHOL 195 05/17/2020   HDL 81 05/17/2020   LDLCALC 104 (H) 05/17/2020   LDLDIRECT 131.5 11/06/2011   TRIG 51 05/17/2020   CHOLHDL 2.4 05/17/2020    Hepatic Function Latest Ref Rng & Units 05/17/2020 02/17/2020 08/14/2019  Total Protein 6.0 - 8.5 g/dL 6.9 7.1 7.2  Albumin 3.5 - 4.6 g/dL 4.2 4.1 4.1  AST 0 - 40 IU/L 18 15 16  ALT 0 - 44 IU/L 12 12 13  Alk Phosphatase 48 - 121 IU/L 40(L) 36(L) 39  Total Bilirubin 0.0 - 1.2 mg/dL 0.7 0.6 0.6  Bilirubin, Direct 0.00 - 0.40 mg/dL 0.18 - -    Lab Results  Component Value Date/Time   TSH 2.77 08/14/2019 11:50 AM   TSH 2.90 03/26/2017 12:27 PM   FREET4 1.04 03/04/2015 03:11 PM    CBC Latest Ref Rng & Units 02/16/2021 08/19/2020 02/17/2020  WBC 3.4 -   10.8 x10E3/uL 6.5 5.1 4.7  Hemoglobin 13.0 - 17.7 g/dL 11.8(L) 14.1 13.3  Hematocrit 37.5 - 51.0 % 38.1 44.1 41.3  Platelets 150 - 450 x10E3/uL 189 152.0 149.0(L)    No results found for: VD25OH  Clinical ASCVD: Yes  The ASCVD Risk score (Arnett DK, et al., 2019) failed to calculate for the following reasons:   The 2019 ASCVD risk score is only valid for ages 40 to 79    Depression screen PHQ 2/9 08/19/2020 08/19/2020 02/06/2019  Decreased Interest 0 0 0  Down, Depressed, Hopeless 0 0 0  PHQ - 2 Score 0 0 0  Altered sleeping - - -  Tired, decreased energy - - -  Change in appetite - - -  Feeling bad or failure  about yourself  - - -  Trouble concentrating - - -  Moving slowly or fidgety/restless - - -  Suicidal thoughts - - -  PHQ-9 Score - - -  Difficult doing work/chores - - -  Some recent data might be hidden      Social History   Tobacco Use  Smoking Status Never  Smokeless Tobacco Never   BP Readings from Last 3 Encounters:  04/27/21 (!) 132/58  03/09/21 (!) 124/58  02/17/21 (!) 142/78   Pulse Readings from Last 3 Encounters:  04/27/21 72  03/09/21 68  02/17/21 74   Wt Readings from Last 3 Encounters:  04/27/21 161 lb (73 kg)  03/09/21 160 lb 12.8 oz (72.9 kg)  02/17/21 166 lb (75.3 kg)   BMI Readings from Last 3 Encounters:  04/27/21 26.79 kg/m  03/09/21 26.76 kg/m  02/17/21 27.62 kg/m    Assessment/Interventions: Review of patient past medical history, allergies, medications, health status, including review of consultants reports, laboratory and other test data, was performed as part of comprehensive evaluation and provision of chronic care management services.   SDOH:  (Social Determinants of Health) assessments and interventions performed: Yes  SDOH Screenings   Alcohol Screen: Low Risk    Last Alcohol Screening Score (AUDIT): 0  Depression (PHQ2-9): Low Risk    PHQ-2 Score: 0  Financial Resource Strain: Low Risk    Difficulty of Paying Living Expenses: Not hard at all  Food Insecurity: No Food Insecurity   Worried About Running Out of Food in the Last Year: Never true   Ran Out of Food in the Last Year: Never true  Housing: Low Risk    Last Housing Risk Score: 0  Physical Activity: Sufficiently Active   Days of Exercise per Week: 5 days   Minutes of Exercise per Session: 30 min  Social Connections: Socially Integrated   Frequency of Communication with Friends and Family: More than three times a week   Frequency of Social Gatherings with Friends and Family: More than three times a week   Attends Religious Services: More than 4 times per year   Active  Member of Clubs or Organizations: Yes   Attends Club or Organization Meetings: More than 4 times per year   Marital Status: Married  Stress: No Stress Concern Present   Feeling of Stress : Not at all  Tobacco Use: Low Risk    Smoking Tobacco Use: Never   Smokeless Tobacco Use: Never  Transportation Needs: No Transportation Needs   Lack of Transportation (Medical): No   Lack of Transportation (Non-Medical): No    CCM Care Plan  Allergies  Allergen Reactions   Crestor [Rosuvastatin Calcium]     Muscle cramping     Lipitor [Atorvastatin]     Leg cramping   Other     Environmental    Pravastatin     Leg cramping    Medications Reviewed Today     Reviewed by Hayes, Pamela, CMA (Certified Medical Assistant) on 04/27/21 at 1514  Med List Status: <None>   Medication Order Taking? Sig Documenting Provider Last Dose Status Informant  albuterol (PROVENTIL HFA;VENTOLIN HFA) 108 (90 Base) MCG/ACT inhaler [id number] Yes Inhale 2 puffs into the lungs every 6 (six) hours as needed for wheezing or shortness of breath. Nche, Charlotte Lum, NP Taking Active   amLODipine (NORVASC) 5 MG tablet [id number] Yes TAKE 1 TABLET BY MOUTH EVERY DAY Burns, Stacy J, MD Taking Active   aspirin 81 MG chewable tablet [id number] Yes Chew by mouth daily. [provider] Taking Active Self  clopidogrel (PLAVIX) 75 MG tablet [id number] Yes TAKE 1 TABLET BY MOUTH EVERY DAY Burns, Stacy J, MD Taking Active   Coenzyme Q10 200 MG capsule [id number] Yes Take 200 mg by mouth daily.  McLean, Dalton S, MD Taking Active Self  fluticasone (FLONASE) 50 MCG/ACT nasal spray [id number] Yes Place 2 sprays into both nostrils daily. Nche, Charlotte Lum, NP Taking Active   furosemide (LASIX) 40 MG tablet [id number] Yes Take 1 tablet (40 mg total) by mouth daily. Smith, Henry W, MD Taking Active   Garlic Oil 1000 MG CAPS [id number] Yes Take 1 capsule by mouth daily. [provider] Taking Active Self  isosorbide mononitrate  (IMDUR) 60 MG 24 hr tablet [id number] Yes TAKE 1 TABLET BY MOUTH EVERY DAY Smith, Henry W, MD Taking Active   loratadine (CLARITIN) 10 MG tablet [id number] Yes Take 10 mg by mouth daily as needed for rhinitis.  [provider] Taking Active Self  losartan-hydrochlorothiazide (HYZAAR) 50-12.5 MG tablet [id number] Yes TAKE 1 TABLET BY MOUTH EVERY DAY Burns, Stacy J, MD Taking Active   metoprolol tartrate (LOPRESSOR) 25 MG tablet [id number] Yes TAKE 1 TABLET BY MOUTH TWICE A DAY Ingold, Laura R, NP Taking Active   Multiple Vitamins-Minerals (CENTRUM SILVER) tablet [id number] Yes Take 1 tablet by mouth daily. [provider] Taking Active Self  nitroGLYCERIN (NITROSTAT) 0.4 MG SL tablet [id number] Yes PLACE 1 TABLET (0.4 MG TOTAL) UNDER THE TONGUE EVERY 5 (FIVE) MINUTES AS NEEDED FOR CHEST PAIN. Meng, Hao, PA Taking Active   Olopatadine HCl (PATADAY) 0.2 % SOLN [id number] Yes  [provider]  Active   Omega-3 Fatty Acids (FISH OIL) 1000 MG CAPS [id number] Yes Take 1 capsule by mouth daily. [provider] Taking Active Self  pantoprazole (PROTONIX) 40 MG tablet [id number] Yes TAKE 1 TABLET (40 MG TOTAL) BY MOUTH DAILY. McLean, Dalton S, MD Taking Active   ranolazine (RANEXA) 500 MG 12 hr tablet [id number] Yes TAKE 1 TABLET BY MOUTH TWICE A DAY Smith, Henry W, MD Taking Active   rosuvastatin (CRESTOR) 5 MG tablet [id number] Yes TAKE 1 TABLET (5 MG TOTAL) BY MOUTH EVERY MONDAY, WEDNESDAY, AND FRIDAY. Smith, Henry W, MD Taking Active   tamsulosin (FLOMAX) 0.4 MG CAPS capsule [id number] Yes Take 0.4 mg by mouth daily. [provider] Taking Active Self            Patient Active Problem List   Diagnosis Date Noted   Acute on chronic heart failure with preserved ejection fraction (HFpEF) (HCC) 02/17/2021   History of DVT of lower extremity 06/12/2017   Bilateral leg edema 05/08/2017   Coronary artery disease involving native coronary artery      Diabetes ([place]) 11/09/2015    OSA on CPAP 07/20/2015   History of stroke, lacunar 2016 03/24/2015   Aortic stenosis, severe  01/08/2011   HYPERCHOLESTEROLEMIA 12/01/2007   Essential hypertension 12/01/2007   Peripheral vascular disease ([place]) 12/01/2007   HEMORRHOIDS 12/01/2007   Allergic rhinitis 12/01/2007   GERD 12/01/2007   DEGENERATIVE JOINT DISEASE 12/01/2007    Immunization History  Administered Date(s) Administered   Fluad Quad(high Dose 65+) 06/19/2019, 06/13/2020   Influenza Split 07/02/2011, 07/07/2012   Influenza Whole 05/31/2008, 07/12/2009, 06/27/2010   Influenza, High Dose Seasonal PF 06/26/2013, 06/22/2014, 06/29/2015, 06/06/2016, 06/11/2017, 06/11/2018, 07/20/2020   PFIZER(Purple Top)SARS-COV-2 Vaccination 10/02/2019, 10/23/2019, 06/17/2020   Pneumococcal Conjugate-13 12/23/2014   Pneumococcal Polysaccharide-23 12/22/2013, 01/31/2017, 07/21/2019, 07/20/2020   Tdap 11/08/2004, 12/12/2014    Conditions to be addressed/monitored:  Hypertension, Hyperlipidemia, Diabetes, Heart Failure, and Coronary Artery Disease  There are no care plans that you recently modified to display for this patient.    Medication Assistance: {MEDASSISTANCEINFO:25044}  Compliance/Adherence/Medication fill history: Care Gaps: Foot exam (06/11/18) Eye exam (09/17/18)  Star-Rating Drugs: Losartan/HCTZ Rosuvastatin  Patient's preferred pharmacy is:  [id number]- [place], [place] - [address]Phone: [phone]: [phone] Uses pill box? {Yes or If no, why not?:20788} Pt endorses ***% compliance  We discussed: {Pharmacy options:24294} Patient decided to: {US Pharmacy Plan:23885}  Care Plan and Follow Up Patient Decision:  {FOLLOWUP:24991}  Plan: {CM FOLLOW UP PNTIR:44315} ***    Current Barriers:  {pharmacybarriers:24917}  Pharmacist Clinical Goal(s):  Patient will {PHARMACYGOALCHOICES:24921} through  collaboration with PharmD and provider.   Interventions: 1:1 collaboration with BBinnie Rail MD regarding development and update of comprehensive plan of care as evidenced by provider attestation and co-signature Inter-disciplinary care team collaboration (see longitudinal plan of care) Comprehensive medication review performed; medication list updated in electronic medical record  Hypertension / Heart Failure    BP goal is:  <130/80  HF type: Diastolic  Patient checks BP at home several times per month Patient home BP readings are ranging: 120/60-140/90   Patient has failed these meds in the past: n/a Patient is currently controlled on the following medications:  Amlodipine 5 mg daily Losartan-HCTZ 50-12.5 mg daily Metoprolol tartrate 25 mg BID Isosorbide MN 60 mg daily Furosemide 40 mg daily   We discussed BP goals; benefits of medications; pt denies issues/side effects with meds   Plan: Continue current medications    Hyperlipidemia / CAD    LDL goal < 70 Hx CAD s/p 3 stents PVD, hx stroke   Patient has failed these meds in past: atorvastatin, pravastatin Patient is currently controlled on the following medications:  Rosuvastatin 5 mg MWF Ranolazine 500 mg BID Isosorbide MN 60 mg daily Nitroglycerin 0.4 mg SL prn  Aspirin 81 mg daily Clopidogrel 75 mg daily Coenzyme Q10 200 mg daily Omega-3 Fish oil 1000 mg daily   We discussed:  diet and exercise extensively; Cholesterol goals; benefits of statin for ASCVD risk reduction; importance of clopidogrel/aspirin to maintain patent stents   Plan: Continue current medications   Diabetes    A1c goal <8% Diet-controlled   Plan: Continue control with diet and exercise   GERD    Patient has failed these meds in past: n/a Patient is currently controlled on the following medications:  Pantoprazole 40 mg daily   We discussed: Pt denies issues with reflux since starting PPI   Plan: Continue current medications  Allergic rhinitis    Patient has failed these meds in past: n/a Patient is currently controlled on the following medications:  Fluticasone nasal spray PRN Loratadine 10 mg daily PRN Albuterol HFA prn   We discussed:  Patient is satisfied with current regimen and denies issues   Plan: Continue current medications   BPH    Per Dr Diona Fanti Patient has failed these meds in past: n/a Patient is currently controlled on the following medications:  tamsulosin 0.4 mg daily    We discussed:  Indication for medication; Patient is satisfied with current regimen and denies issues   Plan: Continue current medications   Health Maintenance    Patient is currently controlled on the following medications:  Garlic oil 9735 mg daily Multivitamin   We discussed:  Patient is satisfied with current regimen and denies issues   Plan: Continue current medications  Patient Goals/Self-Care Activities Patient will:  - {pharmacypatientgoals:24919}

## 2021-06-26 NOTE — Telephone Encounter (Signed)
  Chronic Care Management   Outreach Note  06/26/2021 Name: Ronnie Boyd MRN: [id number] DOB: [birth year]  Referred by: Pincus Sanes, MD  Patient had a phone appointment scheduled with clinical pharmacist today.  An unsuccessful telephone outreach was attempted today. The patient was referred to the pharmacist for assistance with medications, care management and care coordination.   Patient will NOT be penalized in any way for missing a CCM appointment. The no-show fee does not apply.  If possible, a message was left to return call to: [phone] or to [facility]: [phone]   Al Corpus, PharmD, Patsy [place], CPP Clinical Pharmacist [facility] [phone]

## 2021-07-10 ENCOUNTER — Other Ambulatory Visit: Payer: Self-pay

## 2021-07-10 ENCOUNTER — Ambulatory Visit (INDEPENDENT_AMBULATORY_CARE_PROVIDER_SITE_OTHER): Payer: Medicare PPO

## 2021-07-10 DIAGNOSIS — Z23 Encounter for immunization: Secondary | ICD-10-CM

## 2021-07-15 ENCOUNTER — Other Ambulatory Visit: Payer: Self-pay | Admitting: Internal Medicine

## 2021-07-20 DIAGNOSIS — H1045 Other chronic allergic conjunctivitis: Secondary | ICD-10-CM | POA: Diagnosis not present

## 2021-07-20 DIAGNOSIS — J3089 Other allergic rhinitis: Secondary | ICD-10-CM | POA: Diagnosis not present

## 2021-08-09 ENCOUNTER — Telehealth: Payer: Self-pay | Admitting: Interventional Cardiology

## 2021-08-09 DIAGNOSIS — I5033 Acute on chronic diastolic (congestive) heart failure: Secondary | ICD-10-CM

## 2021-08-09 NOTE — Telephone Encounter (Signed)
Spoke with Ronnie Boyd and he states he has been having issues with dizziness and weakness for about a week.  States swelling is resolved and weight stable.  SOB only occurs with lots of exertion.  Vitals usually 140s/60s, 70s.  Denies CP.  Spoke with Dr. Katrinka Blazing and he wants to have Ronnie Boyd come in for BMET and Pro BNP before making any med changes.  Ronnie Boyd will come in tomorrow for labs.  Ronnie Boyd appreciative for call.

## 2021-08-09 NOTE — Telephone Encounter (Signed)
Pt c/o medication issue:  1. Name of Medication: Furosemide  2. How are you currently taking this medication (dosage and times per day)? 1 time a day  3. Are you having a reaction (difficulty breathing--STAT)?   4. What is your medication issue? Dizziness and weakness

## 2021-08-10 ENCOUNTER — Other Ambulatory Visit: Payer: Medicare PPO

## 2021-08-10 ENCOUNTER — Other Ambulatory Visit: Payer: Self-pay

## 2021-08-10 DIAGNOSIS — I5033 Acute on chronic diastolic (congestive) heart failure: Secondary | ICD-10-CM | POA: Diagnosis not present

## 2021-08-11 LAB — PRO B NATRIURETIC PEPTIDE: NT-Pro BNP: 3546 pg/mL — ABNORMAL HIGH (ref 0–486)

## 2021-08-11 LAB — BASIC METABOLIC PANEL
BUN/Creatinine Ratio: 14 (ref 10–24)
BUN: 17 mg/dL (ref 10–36)
CO2: 21 mmol/L (ref 20–29)
Calcium: 9 mg/dL (ref 8.6–10.2)
Chloride: 105 mmol/L (ref 96–106)
Creatinine, Ser: 1.22 mg/dL (ref 0.76–1.27)
Glucose: 92 mg/dL (ref 70–99)
Potassium: 4.1 mmol/L (ref 3.5–5.2)
Sodium: 141 mmol/L (ref 134–144)
eGFR: 56 mL/min/{1.73_m2} — ABNORMAL LOW (ref >59)

## 2021-08-18 ENCOUNTER — Ambulatory Visit: Payer: Medicare PPO | Admitting: Interventional Cardiology

## 2021-08-18 ENCOUNTER — Encounter: Payer: Self-pay | Admitting: Interventional Cardiology

## 2021-08-18 ENCOUNTER — Other Ambulatory Visit: Payer: Self-pay

## 2021-08-18 VITALS — BP 132/64 | HR 86 | Ht 65.0 in | Wt 160.4 lb

## 2021-08-18 DIAGNOSIS — I2510 Atherosclerotic heart disease of native coronary artery without angina pectoris: Secondary | ICD-10-CM | POA: Diagnosis not present

## 2021-08-18 DIAGNOSIS — I350 Nonrheumatic aortic (valve) stenosis: Secondary | ICD-10-CM

## 2021-08-18 DIAGNOSIS — I5033 Acute on chronic diastolic (congestive) heart failure: Secondary | ICD-10-CM

## 2021-08-18 DIAGNOSIS — I739 Peripheral vascular disease, unspecified: Secondary | ICD-10-CM | POA: Diagnosis not present

## 2021-08-18 DIAGNOSIS — G4733 Obstructive sleep apnea (adult) (pediatric): Secondary | ICD-10-CM | POA: Diagnosis not present

## 2021-08-18 DIAGNOSIS — I10 Essential (primary) hypertension: Secondary | ICD-10-CM | POA: Diagnosis not present

## 2021-08-18 DIAGNOSIS — Z9989 Dependence on other enabling machines and devices: Secondary | ICD-10-CM

## 2021-08-18 DIAGNOSIS — E7800 Pure hypercholesterolemia, unspecified: Secondary | ICD-10-CM | POA: Diagnosis not present

## 2021-08-18 MED ORDER — NITROGLYCERIN 0.4 MG SL SUBL: 0.4000 mg | SUBLINGUAL_TABLET | SUBLINGUAL | 3 refills | Status: AC | PRN

## 2021-08-18 MED ORDER — FUROSEMIDE 40 MG PO TABS
60.0000 mg | ORAL_TABLET | Freq: Every day | ORAL | 3 refills | Status: DC
Start: 2021-08-18 — End: 2022-01-30

## 2021-08-18 NOTE — Progress Notes (Signed)
Cardiology Office Note:    Date:  08/18/2021   ID:  Ronnie Boyd, DOB [birth year], MRN [id number]  PCP:  Ronnie Sanes, MD  Cardiologist:  Ronnie Noe, MD   Referring MD: Ronnie Sanes, MD   Chief Complaint  Patient presents with   Congestive Heart Failure   Cardiac Valve Problem    Aortic stenosis     History of Present Illness:    Ronnie Boyd is a 87 y.o. male with a hx of  severe aortic stenosis documented by recent echo, CAD with stents in the mid LAD, distal LAD, and ramus intermedius, hypertension, diabetes II, peripheral arterial disease, obstructive sleep apnea, and acute on chronic diastolic HF 02/16/2021.  Musculoskeletal discomfort statin intolerance to Lipitor, pravastatin, and Crestor.   I asked that he come in today because of the recent complaints that he feels dizzy and weak.  The patient is also concerned that his heart rate is 86 bpm.  At last office visit in August the heart rate was 72 bpm.  He is here with his wife and daughter.  He does not feel that the 40 mg of furosemide is helped.  He is still having lower extremity swelling and shortness of breath.  He does not like the furosemide because of frequent urination.  A much more in-depth conversation concerning valve replacement was pursued by the patient today.  He is always stated that he did not want to have procedures done at his age.  He now seems to understand he does not like the way he feels and wonders if having the valve replaced with take away the need for the diuretic and help improve his shortness of breath.  He will contemplate whether to proceed with evaluation.  He has also been having "indigestion".  I told him this could possibly be related to angina from aortic stenosis or previously placed stents/CAD progression.  Past Medical History:  Diagnosis Date   Allergic rhinitis    Aortic stenosis    mild echo 7/14   Borderline type 2 diabetes mellitus    Coronary artery disease     Degenerative joint disease    Family history of adverse reaction to anesthesia    "daughter had problems when she was a teen; none since; it was related to asthma"   GERD (gastroesophageal reflux disease)    Hemorrhoids    History of hiatal hernia    "dx'd years ago" (06/01/2015)   Hypercholesteremia    Hypertension    OSA on CPAP since 05/23/2015   Prostatitis    hx of   PVD (peripheral vascular disease) (HCC)    Stroke [facility])     Past Surgical History:  Procedure Laterality Date   CARDIAC CATHETERIZATION [name] 03/18/2015   Procedure: Left Heart Cath and Coronary Angiography;  Surgeon: Ronnie Morale, MD;  Location: [facility] INVASIVE CV LAB;  Service: Cardiovascular;  Laterality: [name];   CARDIAC CATHETERIZATION [name] 06/01/2015   Procedure: Coronary Stent Intervention;  Surgeon: Ronnie Ouch, MD;  Location: [facility] INVASIVE CV LAB;  Service: Cardiovascular;  Laterality: [name];   CARDIAC CATHETERIZATION  11/11/2015   CARDIAC CATHETERIZATION [name] 11/11/2015   Procedure: Intravascular Pressure Wire/FFR Study;  Surgeon: Ronnie Bollman, MD;  Location: [facility] INVASIVE CV LAB;  Service: Cardiovascular;  Laterality: [name];   CARPAL TUNNEL RELEASE Bilateral 2006-2007   CATARACT EXTRACTION W/ INTRAOCULAR LENS  IMPLANT, BILATERAL Bilateral ~ 2003   EXCISIONAL HEMORRHOIDECTOMY  1989    Current Medications: Current  Meds  Medication Sig   albuterol (PROVENTIL HFA;VENTOLIN HFA) 108 (90 Base) MCG/ACT inhaler Inhale 2 puffs into the lungs every 6 (six) hours as needed for wheezing or shortness of breath.   amLODipine (NORVASC) 5 MG tablet TAKE 1 TABLET BY MOUTH EVERY DAY   aspirin 81 MG chewable tablet Chew by mouth daily.   clopidogrel (PLAVIX) 75 MG tablet TAKE 1 TABLET BY MOUTH EVERY DAY   Coenzyme Q10 200 MG capsule Take 200 mg by mouth daily.    fluticasone (FLONASE) 50 MCG/ACT nasal spray Place 2 sprays into both nostrils daily.   Garlic Oil 1000 MG CAPS Take 1 capsule by mouth daily.   isosorbide mononitrate  (IMDUR) 60 MG 24 hr tablet TAKE 1 TABLET BY MOUTH EVERY DAY   loratadine (CLARITIN) 10 MG tablet Take 10 mg by mouth daily as needed for rhinitis.    losartan-hydrochlorothiazide (HYZAAR) 50-12.5 MG tablet TAKE 1 TABLET BY MOUTH EVERY DAY   metoprolol tartrate (LOPRESSOR) 25 MG tablet TAKE 1 TABLET BY MOUTH TWICE A DAY   Multiple Vitamins-Minerals (CENTRUM SILVER) tablet Take 1 tablet by mouth daily.   Olopatadine HCl 0.2 % SOLN    Omega-3 Fatty Acids (FISH OIL) 1000 MG CAPS Take 1 capsule by mouth daily.   pantoprazole (PROTONIX) 40 MG tablet TAKE 1 TABLET (40 MG TOTAL) BY MOUTH DAILY.   ranolazine (RANEXA) 500 MG 12 hr tablet TAKE 1 TABLET BY MOUTH TWICE A DAY   rosuvastatin (CRESTOR) 5 MG tablet TAKE 1 TABLET (5 MG TOTAL) BY MOUTH EVERY MONDAY, WEDNESDAY, AND FRIDAY.   tamsulosin (FLOMAX) 0.4 MG CAPS capsule Take 0.4 mg by mouth daily.   [DISCONTINUED] furosemide (LASIX) 40 MG tablet Take 1 tablet (40 mg total) by mouth daily.   [DISCONTINUED] nitroGLYCERIN (NITROSTAT) 0.4 MG SL tablet PLACE 1 TABLET (0.4 MG TOTAL) UNDER THE TONGUE EVERY 5 (FIVE) MINUTES AS NEEDED FOR CHEST PAIN.     Allergies:   Crestor [rosuvastatin calcium], Lipitor [atorvastatin], Other, and Pravastatin   Social History   Socioeconomic History   Marital status: Married    Spouse name: Not on file   Number of children: Not on file   Years of education: Not on file   Highest education level: Not on file  Occupational History   Occupation: retired  Tobacco Use   Smoking status: Never   Smokeless tobacco: Never  Vaping Use   Vaping Use: Never used  Substance and Sexual Activity   Alcohol use: No   Drug use: No   Sexual activity: Not Currently  Other Topics Concern   Not on file  Social History Narrative   Daughter is Ronnie Boyd Psychologist, sport and exercise at [employer])   Social Determinants of Corporate investment banker Strain: Low Risk    Difficulty of Paying Living Expenses: Not hard at all  Food  Insecurity: No Food Insecurity   Worried About Programme researcher, broadcasting/film/video in the Last Year: Never true   Barista in the Last Year: Never true  Transportation Needs: No Transportation Needs   Lack of Transportation (Medical): No   Lack of Transportation (Non-Medical): No  Physical Activity: Sufficiently Active   Days of Exercise per Week: 5 days   Minutes of Exercise per Session: 30 min  Stress: No Stress Concern Present   Feeling of Stress : Not at all  Social Connections: Socially Integrated   Frequency of Communication with Friends and Family: More than three times a week  Frequency of Social Gatherings with Friends and Family: More than three times a week   Attends Religious Services: More than 4 times per year   Active Member of Clubs or Organizations: Yes   Attends Engineer, structural: More than 4 times per year   Marital Status: Married     Family History: The patient's family history includes Arthritis in an other family member; Diabetes in his brother; Diabetes (age of onset: 77) in his mother; Hypertension in his mother; Stroke (age of onset: 4) in his father.  ROS:   Please see the history of present illness.    Aggravated by increased urination due to furosemide.  Does not notice any improvement in lower extremity edema.  Objectively, when weights are compared no changes noted since June.  He needs more aggressive diuresis but is aggravated by urinary incontinence.  All other systems reviewed and are negative.  EKGs/Labs/Other Studies Reviewed:    The following studies were reviewed today: No new imaging  EKG:  EKG not repeated  Recent Labs: 02/16/2021: Hemoglobin 11.8; Platelets 189 08/10/2021: BUN 17; Creatinine, Ser 1.22; NT-Pro BNP 3,546; Potassium 4.1; Sodium 141  Recent Lipid Panel    Component Value Date/Time   CHOL 195 05/17/2020 1331   CHOL 164 04/04/2015 1059   TRIG 51 05/17/2020 1331   TRIG 42 04/04/2015 1059   HDL 81 05/17/2020 1331   HDL  82 04/04/2015 1059   CHOLHDL 2.4 05/17/2020 1331   CHOLHDL 3 02/17/2020 1255   VLDL 9.0 02/17/2020 1255   LDLCALC 104 (H) 05/17/2020 1331   LDLCALC 74 04/04/2015 1059   LDLDIRECT 131.5 11/06/2011 1057    Physical Exam:    VS:  BP 132/64   Pulse 86   Ht 5\' 5"  (1.651 m)   Wt 160 lb 6.4 oz (72.8 kg)   SpO2 98%   BMI 26.69 kg/m     Wt Readings from Last 3 Encounters:  08/18/21 160 lb 6.4 oz (72.8 kg)  04/27/21 161 lb (73 kg)  03/09/21 160 lb 12.8 oz (72.9 kg)     GEN: Compatible with age although appearing younger than chronological. No acute distress HEENT: Normal NECK: No JVD.  Carotids a difficult to palpate LYMPHATICS: No lymphadenopathy CARDIAC: 4/6 crescendo decrescendo systolic without diastolic murmur. RRR S4 gallop, or edema. VASCULAR:  Normal Pulses. No bruits. RESPIRATORY:  Clear to auscultation without rales, wheezing or rhonchi  ABDOMEN: Soft, non-tender, non-distended, No pulsatile mass, MUSCULOSKELETAL: No deformity  SKIN: Warm and dry NEUROLOGIC:  Alert and oriented x 3 PSYCHIATRIC:  Normal affect   ASSESSMENT:    1. Acute on chronic diastolic HF (heart failure) (HCC)   2. Severe aortic stenosis   3. OSA on CPAP   4. Coronary artery disease involving native coronary artery of native heart without angina pectoris   5. Essential hypertension   6. Peripheral vascular disease (HCC)   7. HYPERCHOLESTEROLEMIA    PLAN:    In order of problems listed above:  Not improved on 40 mg of furosemide.  Increase to 60 mg.  He is not liking diuretic therapy because of urinary incontinence.   Being on diuretic therapy is causing him to consider TAVR.  He is informed that as he gets sicker the risk of TAVR goes up and we may reach a point where the procedure cannot be done.  Also discussed the possibility of acute incapacitating cardiovascular event.  He and the family seem to understand this. Continues with CPAP Occasional "indigestion"  which I redirected to the  possibility that he is now also having angina. Blood pressure is still relatively stable at 130 mmHg systolic.  Increase in diuretic therapy will threaten to lower the blood pressure acutely and cause syncope or weakness.  Patient is educated about this possibility. Is having difficulty with legs feeling tired when he walks.  This could be related to claudication and or low output from aortic stenosis. We did not discuss his lipids.  At the current time he is contemplating reconsideration of TAVR.  He still feels that his age is a big issue relative to having the procedure had any long-term improvement.  His wife and daughter are present as we had an extended conversation.  This is the first office visit where he has been willing and eager to to discuss in more detail a strategy to treat the valve rather than simple clinical follow-up.  We have decided to increase Lasix to 60 mg/day.  He will try this over the weekend and give [place] feedback that his weight, blood pressure, and overall clinical status on Monday or Tuesday of this coming week.   Medication Adjustments/Labs and Tests Ordered: Current medicines are reviewed at length with the patient today.  Concerns regarding medicines are outlined above.  No orders of the defined types were placed in this encounter.  Meds ordered this encounter  Medications   nitroGLYCERIN (NITROSTAT) 0.4 MG SL tablet    Sig: Place 1 tablet (0.4 mg total) under the tongue every 5 (five) minutes as needed for chest pain.    Dispense:  25 tablet    Refill:  3   furosemide (LASIX) 40 MG tablet    Sig: Take 1.5 tablets (60 mg total) by mouth daily.    Dispense:  135 tablet    Refill:  3    Dose increase    Patient Instructions  Medication Instructions:  1) INCREASE Furosemide to 60mg  once daily. Monitor your weight every morning before Furosemide.  Check your blood pressure daily.  Call Monday or Tuesday next week with those readings.  *If you need a refill on  your cardiac medications before your next appointment, please call your pharmacy*   Lab Work: None If you have labs (blood work) drawn today and your tests are completely normal, you will receive your results only by: MyChart Message (if you have MyChart) OR A paper copy in the mail If you have any lab test that is abnormal or we need to change your treatment, we will call you to review the results.   Testing/Procedures: None   Follow-Up: At [facility], you and your health needs are our priority.  As part of our continuing mission to provide you with exceptional heart care, we have created designated Provider Care Teams.  These Care Teams include your primary Cardiologist (physician) and Advanced Practice Providers (APPs -  Physician Assistants and Nurse Practitioners) who all work together to provide you with the care you need, when you need it.  We recommend signing up for the patient portal called "MyChart".  Sign up information is provided on this After Visit Summary.  MyChart is used to connect with patients for Virtual Visits (Telemedicine).  Patients are able to view lab/test results, encounter notes, upcoming appointments, etc.  Non-urgent messages can be sent to your provider as well.   To learn more about what you can do with MyChart, go to [facility].    Your next appointment:   1-2 month(s)  The format  for your next appointment:   In Person  Provider:   Lesleigh Noe, MD     Other Instructions     Signed, Ronnie Noe, MD  08/18/2021 5:58 PM    [facility]

## 2021-08-18 NOTE — Patient Instructions (Signed)
Medication Instructions:  1) INCREASE Furosemide to 60mg  once daily. Monitor your weight every morning before Furosemide.  Check your blood pressure daily.  Call Monday or Tuesday next week with those readings.  *If you need a refill on your cardiac medications before your next appointment, please call your pharmacy*   Lab Work: None If you have labs (blood work) drawn today and your tests are completely normal, you will receive your results only by: MyChart Message (if you have MyChart) OR A paper copy in the mail If you have any lab test that is abnormal or we need to change your treatment, we will call you to review the results.   Testing/Procedures: None   Follow-Up: At [facility], you and your health needs are our priority.  As part of our continuing mission to provide you with exceptional heart care, we have created designated Provider Care Teams.  These Care Teams include your primary Cardiologist (physician) and Advanced Practice Providers (APPs -  Physician Assistants and Nurse Practitioners) who all work together to provide you with the care you need, when you need it.  We recommend signing up for the patient portal called "MyChart".  Sign up information is provided on this After Visit Summary.  MyChart is used to connect with patients for Virtual Visits (Telemedicine).  Patients are able to view lab/test results, encounter notes, upcoming appointments, etc.  Non-urgent messages can be sent to your provider as well.   To learn more about what you can do with MyChart, go to [facility].    Your next appointment:   1-2 month(s)  The format for your next appointment:   In Person  Provider:   [email or web address], MD     Other Instructions

## 2021-08-21 ENCOUNTER — Encounter: Payer: Self-pay | Admitting: Internal Medicine

## 2021-08-21 NOTE — Patient Instructions (Addendum)
   Your sugars are well controlled    Medications changes include :  none      Please followup in 6 months

## 2021-08-21 NOTE — Progress Notes (Signed)
Subjective:    Patient ID: Ronnie Boyd, male    DOB: Jan 11, 1927, 87 y.o.   MRN: [id number]  This visit occurred during the SARS-CoV-2 public health emergency.  Safety protocols were in place, including screening questions prior to the visit, additional usage of staff PPE, and extensive cleaning of exam room while observing appropriate contact time as indicated for disinfecting solutions.     HPI The patient is here for follow up of their chronic medical problems, including DM, CAD, htn, leg edema, h/o CVA, hld, GERD, OSA on cpap, severe AS  Energy slightly worse.  SOB and chest tightness is the same.   Medications and allergies reviewed with patient and updated if appropriate.  Patient Active Problem List   Diagnosis Date Noted   Acute on chronic heart failure with preserved ejection fraction (HFpEF) (HCC) 02/17/2021   History of DVT of lower extremity 06/12/2017   Bilateral leg edema 05/08/2017   Coronary artery disease involving native coronary artery    Diabetes (HCC) 11/09/2015   OSA on CPAP 07/20/2015   History of stroke, lacunar 2016 03/24/2015   Aortic stenosis, severe  01/08/2011   HYPERCHOLESTEROLEMIA 12/01/2007   Essential hypertension 12/01/2007   Peripheral vascular disease (HCC) 12/01/2007   HEMORRHOIDS 12/01/2007   Allergic rhinitis 12/01/2007   GERD 12/01/2007   DEGENERATIVE JOINT DISEASE 12/01/2007    Current Outpatient Medications on File Prior to Visit  Medication Sig Dispense Refill   albuterol (PROVENTIL HFA;VENTOLIN HFA) 108 (90 Base) MCG/ACT inhaler Inhale 2 puffs into the lungs every 6 (six) hours as needed for wheezing or shortness of breath. 1 Inhaler 0   amLODipine (NORVASC) 5 MG tablet TAKE 1 TABLET BY MOUTH EVERY DAY 90 tablet 0   aspirin 81 MG chewable tablet Chew by mouth daily.     clopidogrel (PLAVIX) 75 MG tablet TAKE 1 TABLET BY MOUTH EVERY DAY 90 tablet 3   Coenzyme Q10 200 MG capsule Take 200 mg by mouth daily.      fluticasone  (FLONASE) 50 MCG/ACT nasal spray Place 2 sprays into both nostrils daily. 16 g 0   furosemide (LASIX) 40 MG tablet Take 1.5 tablets (60 mg total) by mouth daily. 135 tablet 3   Garlic Oil 1000 MG CAPS Take 1 capsule by mouth daily.     isosorbide mononitrate (IMDUR) 60 MG 24 hr tablet TAKE 1 TABLET BY MOUTH EVERY DAY 90 tablet 3   loratadine (CLARITIN) 10 MG tablet Take 10 mg by mouth daily as needed for rhinitis.      losartan-hydrochlorothiazide (HYZAAR) 50-12.5 MG tablet TAKE 1 TABLET BY MOUTH EVERY DAY 90 tablet 0   metoprolol tartrate (LOPRESSOR) 25 MG tablet TAKE 1 TABLET BY MOUTH TWICE A DAY 180 tablet 2   Multiple Vitamins-Minerals (CENTRUM SILVER) tablet Take 1 tablet by mouth daily.     nitroGLYCERIN (NITROSTAT) 0.4 MG SL tablet Place 1 tablet (0.4 mg total) under the tongue every 5 (five) minutes as needed for chest pain. 25 tablet 3   Olopatadine HCl 0.2 % SOLN      Omega-3 Fatty Acids (FISH OIL) 1000 MG CAPS Take 1 capsule by mouth daily.     pantoprazole (PROTONIX) 40 MG tablet TAKE 1 TABLET (40 MG TOTAL) BY MOUTH DAILY. 30 tablet 11   ranolazine (RANEXA) 500 MG 12 hr tablet TAKE 1 TABLET BY MOUTH TWICE A DAY 180 tablet 3   rosuvastatin (CRESTOR) 5 MG tablet TAKE 1 TABLET (5 MG TOTAL) BY  MOUTH EVERY MONDAY, WEDNESDAY, AND FRIDAY. 45 tablet 3   tamsulosin (FLOMAX) 0.4 MG CAPS capsule Take 0.4 mg by mouth daily.     No current facility-administered medications on file prior to visit.    Past Medical History:  Diagnosis Date   Allergic rhinitis    Aortic stenosis    mild echo 7/14   Borderline type 2 diabetes mellitus    Coronary artery disease    Degenerative joint disease    Family history of adverse reaction to anesthesia    "daughter had problems when she was a teen; none since; it was related to asthma"   GERD (gastroesophageal reflux disease)    Hemorrhoids    History of hiatal hernia    "dx'd years ago" (06/01/2015)   Hypercholesteremia    Hypertension    OSA on  CPAP since 05/23/2015   Prostatitis    hx of   PVD (peripheral vascular disease) (HCC)    Stroke [facility])     Past Surgical History:  Procedure Laterality Date   CARDIAC CATHETERIZATION [name] 03/18/2015   Procedure: Left Heart Cath and Coronary Angiography;  Surgeon: Laurey Morale, MD;  [facility]AS[facility] CV LAB;  Service: Cardiovascular;  Laterality: [name];   CARDIAC CATHETERIZATION [name] 06/01/2015   Procedure: Coronary Stent Intervention;  Surgeon: Iran Ouch, MD;  Location: [facility] INVASIVE CV LAB;  Service: Cardiovascular;  Laterality: [name];   CARDIAC CATHETERIZATION  11/11/2015   CARDIAC CATHETERIZATION [name] 11/11/2015   Procedure: Intravascular Pressure Wire/FFR Study;  Surgeon: Tonny Bollman, MD;  Location: [facility] INVASIVE CV LAB;  Service: Cardiovascular;  Laterality: [name];   CARPAL TUNNEL RELEASE Bilateral 2006-2007   CATARACT EXTRACTION W/ INTRAOCULAR LENS  IMPLANT, BILATERAL Bilateral ~ 2003   EXCISIONAL HEMORRHOIDECTOMY  1989    Social History   Socioeconomic History   Marital status: Married    Spouse name: Not on file   Number of children: Not on file   Years of education: Not on file   Highest education level: Not on file  Occupational History   Occupation: retired  Tobacco Use   Smoking status: Never   Smokeless tobacco: Never  Vaping Use   Vaping Use: Never used  Substance and Sexual Activity   Alcohol use: No   Drug use: No   Sexual activity: Not Currently  Other Topics Concern   Not on file  Social History Narrative   Daughter is [name] Psychologist, sport and exercise at [employer])   Social Determinants of Corporate investment banker Strain: Not on file  Food Insecurity: Not on file  Transportation Needs: Not on file  Physical Activity: Not on file  Stress: Not on file  Social Connections: Not on file    Family History  Problem Relation Age of Onset   Hypertension Mother    Diabetes Mother 81   Stroke Father 82   Arthritis Other    Diabetes Brother      Review of Systems  Constitutional:  Negative for fever.  Respiratory:  Positive for shortness of breath. Negative for cough and wheezing.   Cardiovascular:  Positive for chest pain and leg swelling (mild). Negative for palpitations.  Neurological:  Positive for dizziness (sometimes) and light-headedness (sometimes). Negative for headaches.      Objective:   Vitals:   08/22/21 1405  BP: 130/78  Pulse: 91  Temp: 98.3 F (36.8 C)  SpO2: 96%   BP Readings from Last 3 Encounters:  08/22/21 130/78  08/18/21 132/64  04/27/21 Marland Kitchen)  132/58   Wt Readings from Last 3 Encounters:  08/22/21 159 lb 12.8 oz (72.5 kg)  08/18/21 160 lb 6.4 oz (72.8 kg)  04/27/21 161 lb (73 kg)   Body mass index is 26.59 kg/m.   Physical Exam    Constitutional: Appears well-developed and well-nourished. No distress.  HENT:  Head: Normocephalic and atraumatic.  Neck: Neck supple. No tracheal deviation present. No thyromegaly present.  No cervical lymphadenopathy Cardiovascular: Normal rate, regular rhythm and normal heart sounds.    3/6 systolic murmur heard. No carotid bruit .  Mild b/l LE edema Pulmonary/Chest: Effort normal and breath sounds normal. No respiratory distress. No has no wheezes. No rales.  Skin: Skin is warm and dry. Not diaphoretic.  Psychiatric: Normal mood and affect. Behavior is normal.      Assessment & Plan:    See Problem List for Assessment and Plan of chronic medical problems.

## 2021-08-22 ENCOUNTER — Ambulatory Visit: Payer: Medicare PPO | Admitting: Internal Medicine

## 2021-08-22 ENCOUNTER — Other Ambulatory Visit: Payer: Self-pay

## 2021-08-22 VITALS — BP 130/78 | HR 91 | Temp 98.3°F | Ht 65.0 in | Wt 159.8 lb

## 2021-08-22 DIAGNOSIS — Z9989 Dependence on other enabling machines and devices: Secondary | ICD-10-CM | POA: Diagnosis not present

## 2021-08-22 DIAGNOSIS — R600 Localized edema: Secondary | ICD-10-CM | POA: Diagnosis not present

## 2021-08-22 DIAGNOSIS — I10 Essential (primary) hypertension: Secondary | ICD-10-CM | POA: Diagnosis not present

## 2021-08-22 DIAGNOSIS — Z8673 Personal history of transient ischemic attack (TIA), and cerebral infarction without residual deficits: Secondary | ICD-10-CM | POA: Diagnosis not present

## 2021-08-22 DIAGNOSIS — E119 Type 2 diabetes mellitus without complications: Secondary | ICD-10-CM

## 2021-08-22 DIAGNOSIS — K219 Gastro-esophageal reflux disease without esophagitis: Secondary | ICD-10-CM | POA: Diagnosis not present

## 2021-08-22 DIAGNOSIS — G4733 Obstructive sleep apnea (adult) (pediatric): Secondary | ICD-10-CM

## 2021-08-22 DIAGNOSIS — E7800 Pure hypercholesterolemia, unspecified: Secondary | ICD-10-CM | POA: Diagnosis not present

## 2021-08-22 DIAGNOSIS — I2510 Atherosclerotic heart disease of native coronary artery without angina pectoris: Secondary | ICD-10-CM

## 2021-08-22 LAB — POCT GLYCOSYLATED HEMOGLOBIN (HGB A1C)
HbA1c POC (<> result, manual entry): 5.5 % (ref 4.0–5.6)
HbA1c, POC (controlled diabetic range): 5.5 % (ref 0.0–7.0)
HbA1c, POC (prediabetic range): 5.5 % — AB (ref 5.7–6.4)
Hemoglobin A1C: 5.5 % (ref 4.0–5.6)

## 2021-08-22 NOTE — Assessment & Plan Note (Addendum)
Chronic BP well controlled Continue  hyzaar 50-12.5mg  daily, metoprolol 25 mg bid, imdur 60 mg daily, amlodipine 5 mg daily, lasix 60 mg daily

## 2021-08-22 NOTE — Assessment & Plan Note (Signed)
Chronic Lab Results  Component Value Date   HGBA1C 5.5 08/22/2021   HGBA1C 5.5 08/22/2021   HGBA1C 5.5 (A) 08/22/2021   HGBA1C 5.5 08/22/2021   Diet controlled

## 2021-08-22 NOTE — Assessment & Plan Note (Addendum)
Chronic  Has not used his cpap recently

## 2021-08-22 NOTE — Assessment & Plan Note (Signed)
Chronic GERD controlled Continue pantoprazole 40 mg daily 

## 2021-08-22 NOTE — Assessment & Plan Note (Signed)
Chronic Check lipid panel  Continue crestor 3 days a week

## 2021-08-22 NOTE — Assessment & Plan Note (Signed)
Chronic Controlled, stable Continue lasix 60 mg daily

## 2021-08-22 NOTE — Assessment & Plan Note (Signed)
H/o lacunar infarct No residual On plavix 75 mg daily, crestor 5 mg TIW BP well controlled

## 2021-10-09 ENCOUNTER — Ambulatory Visit (INDEPENDENT_AMBULATORY_CARE_PROVIDER_SITE_OTHER): Payer: Medicare PPO

## 2021-10-09 ENCOUNTER — Other Ambulatory Visit: Payer: Self-pay

## 2021-10-09 DIAGNOSIS — Z0000 Encounter for general adult medical examination without abnormal findings: Secondary | ICD-10-CM

## 2021-10-09 NOTE — Patient Instructions (Signed)
Ronnie Boyd , Thank you for taking time to come for your Medicare Wellness Visit. I appreciate your ongoing commitment to your health goals. Please review the following plan we discussed and let me know if I can assist you in the future.   Screening recommendations/referrals: Colonoscopy: no longer required  Recommended yearly ophthalmology/optometry visit for glaucoma screening and checkup Recommended yearly dental visit for hygiene and checkup  Vaccinations: Influenza vaccine: completed  Pneumococcal vaccine: completed  Tdap vaccine: 12/12/2014 Shingles vaccine: will consider   Advanced directives: yes   Conditions/risks identified: none   Next appointment: none   Preventive Care 65 Years and Older, Male Preventive care refers to lifestyle choices and visits with your health care provider that can promote health and wellness. What does preventive care include? A yearly physical exam. This is also called an annual well check. Dental exams once or twice a year. Routine eye exams. Ask your health care provider how often you should have your eyes checked. Personal lifestyle choices, including: Daily care of your teeth and gums. Regular physical activity. Eating a healthy diet. Avoiding tobacco and drug use. Limiting alcohol use. Practicing safe sex. Taking low doses of aspirin every day. Taking vitamin and mineral supplements as recommended by your health care provider. What happens during an annual well check? The services and screenings done by your health care provider during your annual well check will depend on your age, overall health, lifestyle risk factors, and family history of disease. Counseling  Your health care provider may ask you questions about your: Alcohol use. Tobacco use. Drug use. Emotional well-being. Home and relationship well-being. Sexual activity. Eating habits. History of falls. Memory and ability to understand (cognition). Work and work  Astronomer. Screening  You may have the following tests or measurements: Height, weight, and BMI. Blood pressure. Lipid and cholesterol levels. These may be checked every 5 years, or more frequently if you are over 4 years old. Skin check. Lung cancer screening. You may have this screening every year starting at age 64 if you have a 30-pack-year history of smoking and currently smoke or have quit within the past 15 years. Fecal occult blood test (FOBT) of the stool. You may have this test every year starting at age 40. Flexible sigmoidoscopy or colonoscopy. You may have a sigmoidoscopy every 5 years or a colonoscopy every 10 years starting at age 91. Prostate cancer screening. Recommendations will vary depending on your family history and other risks. Hepatitis C blood test. Hepatitis B blood test. Sexually transmitted disease (STD) testing. Diabetes screening. This is done by checking your blood sugar (glucose) after you have not eaten for a while (fasting). You may have this done every 1-3 years. Abdominal aortic aneurysm (AAA) screening. You may need this if you are a current or former smoker. Osteoporosis. You may be screened starting at age 34 if you are at high risk. Talk with your health care provider about your test results, treatment options, and if necessary, the need for more tests. Vaccines  Your health care provider may recommend certain vaccines, such as: Influenza vaccine. This is recommended every year. Tetanus, diphtheria, and acellular pertussis (Tdap, Td) vaccine. You may need a Td booster every 10 years. Zoster vaccine. You may need this after age 60. Pneumococcal 13-valent conjugate (PCV13) vaccine. One dose is recommended after age 75. Pneumococcal polysaccharide (PPSV23) vaccine. One dose is recommended after age 36. Talk to your health care provider about which screenings and vaccines you need and how often  you need them. This information is not intended to replace  advice given to you by your health care provider. Make sure you discuss any questions you have with your health care provider. Document Released: 09/23/2015 Document Revised: 05/16/2016 Document Reviewed: 06/28/2015 Elsevier Interactive Patient Education  2017 [place] Prevention in the Home Falls can cause injuries. They can happen to people of all ages. There are many things you can do to make your home safe and to help prevent falls. What can I do on the outside of my home? Regularly fix the edges of walkways and driveways and fix any cracks. Remove anything that might make you trip as you walk through a door, such as a raised step or threshold. Trim any bushes or trees on the path to your home. Use bright outdoor lighting. Clear any walking paths of anything that might make someone trip, such as rocks or tools. Regularly check to see if handrails are loose or broken. Make sure that both sides of any steps have handrails. Any raised decks and porches should have guardrails on the edges. Have any leaves, snow, or ice cleared regularly. Use sand or salt on walking paths during winter. Clean up any spills in your garage right away. This includes oil or grease spills. What can I do in the bathroom? Use night lights. Install grab bars by the toilet and in the tub and shower. Do not use towel bars as grab bars. Use non-skid mats or decals in the tub or shower. If you need to sit down in the shower, use a plastic, non-slip stool. Keep the floor dry. Clean up any water that spills on the floor as soon as it happens. Remove soap buildup in the tub or shower regularly. Attach bath mats securely with double-sided non-slip rug tape. Do not have throw rugs and other things on the floor that can make you trip. What can I do in the bedroom? Use night lights. Make sure that you have a light by your bed that is easy to reach. Do not use any sheets or blankets that are too big for your bed.  They should not hang down onto the floor. Have a firm chair that has side arms. You can use this for support while you get dressed. Do not have throw rugs and other things on the floor that can make you trip. What can I do in the kitchen? Clean up any spills right away. Avoid walking on wet floors. Keep items that you use a lot in easy-to-reach places. If you need to reach something above you, use a strong step stool that has a grab bar. Keep electrical cords out of the way. Do not use floor polish or wax that makes floors slippery. If you must use wax, use non-skid floor wax. Do not have throw rugs and other things on the floor that can make you trip. What can I do with my stairs? Do not leave any items on the stairs. Make sure that there are handrails on both sides of the stairs and use them. Fix handrails that are broken or loose. Make sure that handrails are as long as the stairways. Check any carpeting to make sure that it is firmly attached to the stairs. Fix any carpet that is loose or worn. Avoid having throw rugs at the top or bottom of the stairs. If you do have throw rugs, attach them to the floor with carpet tape. Make sure that you have a light  switch at the top of the stairs and the bottom of the stairs. If you do not have them, ask someone to add them for you. What else can I do to help prevent falls? Wear shoes that: Do not have high heels. Have rubber bottoms. Are comfortable and fit you well. Are closed at the toe. Do not wear sandals. If you use a stepladder: Make sure that it is fully opened. Do not climb a closed stepladder. Make sure that both sides of the stepladder are locked into place. Ask someone to hold it for you, if possible. Clearly mark and make sure that you can see: Any grab bars or handrails. First and last steps. Where the edge of each step is. Use tools that help you move around (mobility aids) if they are needed. These  include: Canes. Walkers. Scooters. Crutches. Turn on the lights when you go into a dark area. Replace any light bulbs as soon as they burn out. Set up your furniture so you have a clear path. Avoid moving your furniture around. If any of your floors are uneven, fix them. If there are any pets around you, be aware of where they are. Review your medicines with your doctor. Some medicines can make you feel dizzy. This can increase your chance of falling. Ask your doctor what other things that you can do to help prevent falls. This information is not intended to replace advice given to you by your health care provider. Make sure you discuss any questions you have with your health care provider. Document Released: 06/23/2009 Document Revised: 02/02/2016 Document Reviewed: 10/01/2014 Elsevier Interactive Patient Education  2017 [employer].

## 2021-10-09 NOTE — Progress Notes (Signed)
Subjective:   Ronnie Boyd is a 88 y.o. male who presents for an Subsequent  Medicare Annual Wellness Visit.  I connected with Ronnie Boyd today by telephone and verified that I am speaking with the correct person using two identifiers. Location patient: home Location provider: work Persons participating in the virtual visit: patient, provider.   I discussed the limitations, risks, security and privacy concerns of performing an evaluation and management service by telephone and the availability of in person appointments. I also discussed with the patient that there may be a patient responsible charge related to this service. The patient expressed understanding and verbally consented to this telephonic visit.    Interactive audio and video telecommunications were attempted between this provider and patient, however failed, due to patient having technical difficulties OR patient did not have access to video capability.  We continued and completed visit with audio only.    Review of Systems     Cardiac Risk Factors include: advanced age (>27men, >48 women);hypertension;male gender;dyslipidemia     Objective:    Today's Vitals   There is no height or weight on file to calculate BMI.  Advanced Directives 10/09/2021 08/19/2020 06/11/2017 06/06/2016 06/06/2016 11/11/2015 06/01/2015  Does Patient Have a Medical Advance Directive? Yes Yes Yes Yes Yes No Yes  Type of Paramedic of [place];Living will Living will;Healthcare Power of [place];Living will - - - [place];Living will  Does patient want to make changes to medical advance directive? - No - Patient declined - - - - No - Patient declined  Copy of [place] in Chart? No - copy requested No - copy requested No - copy requested - - - No - copy requested  Would patient like information on creating a medical advance directive? - - - - - No - patient  declined information -    Current Medications (verified) Outpatient Encounter Medications as of 10/09/2021  Medication Sig   albuterol (PROVENTIL HFA;VENTOLIN HFA) 108 (90 Base) MCG/ACT inhaler Inhale 2 puffs into the lungs every 6 (six) hours as needed for wheezing or shortness of breath.   amLODipine (NORVASC) 5 MG tablet TAKE 1 TABLET BY MOUTH EVERY DAY   aspirin 81 MG chewable tablet Chew by mouth daily.   clopidogrel (PLAVIX) 75 MG tablet TAKE 1 TABLET BY MOUTH EVERY DAY   Coenzyme Q10 200 MG capsule Take 200 mg by mouth daily.    fluticasone (FLONASE) 50 MCG/ACT nasal spray Place 2 sprays into both nostrils daily.   furosemide (LASIX) 40 MG tablet Take 1.5 tablets (60 mg total) by mouth daily.   Garlic Oil 123XX123 MG CAPS Take 1 capsule by mouth daily.   isosorbide mononitrate (IMDUR) 60 MG 24 hr tablet TAKE 1 TABLET BY MOUTH EVERY DAY   loratadine (CLARITIN) 10 MG tablet Take 10 mg by mouth daily as needed for rhinitis.    losartan-hydrochlorothiazide (HYZAAR) 50-12.5 MG tablet TAKE 1 TABLET BY MOUTH EVERY DAY   metoprolol tartrate (LOPRESSOR) 25 MG tablet TAKE 1 TABLET BY MOUTH TWICE A DAY   Multiple Vitamins-Minerals (CENTRUM SILVER) tablet Take 1 tablet by mouth daily.   nitroGLYCERIN (NITROSTAT) 0.4 MG SL tablet Place 1 tablet (0.4 mg total) under the tongue every 5 (five) minutes as needed for chest pain.   Olopatadine HCl 0.2 % SOLN    Omega-3 Fatty Acids (FISH OIL) 1000 MG CAPS Take 1 capsule by mouth daily.   pantoprazole (PROTONIX) 40  MG tablet TAKE 1 TABLET (40 MG TOTAL) BY MOUTH DAILY.   ranolazine (RANEXA) 500 MG 12 hr tablet TAKE 1 TABLET BY MOUTH TWICE A DAY   rosuvastatin (CRESTOR) 5 MG tablet TAKE 1 TABLET (5 MG TOTAL) BY MOUTH EVERY MONDAY, WEDNESDAY, AND FRIDAY.   tamsulosin (FLOMAX) 0.4 MG CAPS capsule Take 0.4 mg by mouth daily.   No facility-administered encounter medications on file as of 10/09/2021.    Allergies (verified) Crestor [rosuvastatin calcium],  Lipitor [atorvastatin], Other, and Pravastatin   History: Past Medical History:  Diagnosis Date   Allergic rhinitis    Aortic stenosis    mild echo 7/14   Borderline type 2 diabetes mellitus    Coronary artery disease    Degenerative joint disease    Family history of adverse reaction to anesthesia    "daughter had problems when she was a teen; none since; it was related to asthma"   GERD (gastroesophageal reflux disease)    Hemorrhoids    History of hiatal hernia    "dx'd years ago" (06/01/2015)   Hypercholesteremia    Hypertension    OSA on CPAP since 05/23/2015   Prostatitis    hx of   PVD (peripheral vascular disease) (HCC)    Stroke [facility])    Past Surgical History:  Procedure Laterality Date   CARDIAC CATHETERIZATION [name] 03/18/2015   Procedure: Left Heart Cath and Coronary Angiography;  Surgeon: Laurey Morale, MD;  Location: [facility] INVASIVE CV LAB;  Service: Cardiovascular;  Laterality: [name];   CARDIAC CATHETERIZATION [name] 06/01/2015   Procedure: Coronary Stent Intervention;  Surgeon: Iran Ouch, MD;  Location: [facility] INVASIVE CV LAB;  Service: Cardiovascular;  Laterality: [name];   CARDIAC CATHETERIZATION  11/11/2015   CARDIAC CATHETERIZATION [name] 11/11/2015   Procedure: Intravascular Pressure Wire/FFR Study;  Surgeon: Tonny Bollman, MD;  Location: [facility] INVASIVE CV LAB;  Service: Cardiovascular;  Laterality: [name];   CARPAL TUNNEL RELEASE Bilateral 2006-2007   CATARACT EXTRACTION W/ INTRAOCULAR LENS  IMPLANT, BILATERAL Bilateral ~ 2003   EXCISIONAL HEMORRHOIDECTOMY  1989   Family History  Problem Relation Age of Onset   Hypertension Mother    Diabetes Mother 20   Stroke Father 8   Arthritis Other    Diabetes Brother    Social History   Socioeconomic History   Marital status: Married    Spouse name: Not on file   Number of children: Not on file   Years of education: Not on file   Highest education level: Not on file  Occupational History   Occupation: retired  Tobacco Use    Smoking status: Never   Smokeless tobacco: Never  Vaping Use   Vaping Use: Never used  Substance and Sexual Activity   Alcohol use: No   Drug use: No   Sexual activity: Not Currently  Other Topics Concern   Not on file  Social History Narrative   Daughter is Mrs. Ronnie Boyd Psychologist, sport and exercise at [employer])   Social Determinants of Corporate investment banker Strain: Low Risk    Difficulty of Paying Living Expenses: Not hard at all  Food Insecurity: No Food Insecurity   Worried About Programme researcher, broadcasting/film/video in the Last Year: Never true   Barista in the Last Year: Never true  Transportation Needs: No Transportation Needs   Lack of Transportation (Medical): No   Lack of Transportation (Non-Medical): No  Physical Activity: Insufficiently Active   Days of Exercise per Week: 2 days  Minutes of Exercise per Session: 20 min  Stress: No Stress Concern Present   Feeling of Stress : Not at all  Social Connections: Moderately Isolated   Frequency of Communication with Friends and Family: Three times a week   Frequency of Social Gatherings with Friends and Family: Three times a week   Attends Religious Services: Never   Active Member of Clubs or Organizations: No   Attends Music therapist: Never   Marital Status: Married    Tobacco Counseling Counseling given: Not Answered   Clinical Intake:  Pre-visit preparation completed: Yes  Pain : No/denies pain     Nutritional Risks: None Diabetes: No  How often do you need to have someone help you when you read instructions, pamphlets, or other written materials from your doctor or pharmacy?: 1 - Never What is the last grade level you completed in school?: college  Diabetic?no   Interpreter Needed?: No  Information entered by :: [place] of Daily Living In your present state of health, do you have any difficulty performing the following activities: 10/09/2021 08/22/2021  Hearing? Y N   Vision? N N  Difficulty concentrating or making decisions? N N  Walking or climbing stairs? N N  Dressing or bathing? Y N  Comment has assistnace -  Doing errands, shopping? N N  Preparing Food and eating ? N -  Using the Toilet? N -  In the past six months, have you accidently leaked urine? N -  Do you have problems with loss of bowel control? N -  Managing your Medications? Y -  Comment needs assistance -  Managing your Finances? Y -  Comment needs assitance -  Housekeeping or managing your Housekeeping? Y -  Comment needs assistance -  Some recent data might be hidden    Patient Care Team: Binnie Rail, MD as PCP - General (Internal Medicine) Belva Crome, MD as PCP - Cardiology (Cardiology) Larey Dresser, MD (Cardiology) Franchot Gallo, MD (Urology) Dohmeier, Asencion Partridge, MD (Neurology) Garvin Fila, MD (Neurology) Charlton Haws, [facility] as Pharmacist (Pharmacist)  Indicate any recent Medical Services you may have received from other than [facility] providers in the past year (date may be approximate).     Assessment:   This is a routine wellness examination for Gwyndolyn Saxon.  Hearing/Vision screen Vision Screening - Comments:: Annual eye exams wear glasses   Dietary issues and exercise activities discussed: Current Exercise Habits: Home exercise routine, Type of exercise: walking, Time (Minutes): 20, Frequency (Times/Week): 2, Weekly Exercise (Minutes/Week): 40, Intensity: Mild, Exercise limited by: orthopedic condition(s)   Goals Addressed             This Visit's Progress    exercise   On track    Continue to do yard work and gardening; work in intervals         Depression Screen PHQ 2/9 Scores 10/09/2021 10/09/2021 08/19/2020 08/19/2020 02/06/2019 06/11/2017 05/08/2017  PHQ - 2 Score 0 0 0 0 0 0 0  PHQ- 9 Score - - - - - 0 -    Fall Risk Fall Risk  10/09/2021 08/22/2021 08/19/2020 08/19/2020 08/14/2019  Falls in the past year? 0 0 0 0 0  Number falls  in past yr: 0 0 0 0 0  Injury with Fall? 0 0 0 0 -  Risk for fall due to : No Fall Risks - No Fall Risks No Fall Risks -  Follow up Falls evaluation completed Falls evaluation  completed Falls evaluation completed Falls evaluation completed -    FALL RISK PREVENTION PERTAINING TO THE HOME:  Any stairs in or around the home? Yes  If so, are there any without handrails? No  Home free of loose throw rugs in walkways, pet beds, electrical cords, etc? Yes  Adequate lighting in your home to reduce risk of falls? Yes   ASSISTIVE DEVICES UTILIZED TO PREVENT FALLS:  Life alert? No  Use of a cane, walker or w/c? No  Grab bars in the bathroom? Yes  Shower chair or bench in shower? Yes  Elevated toilet seat or a handicapped toilet? Yes    Cognitive Function:Normal cognitive status assessed by direct observation by this Nurse Health Advisor. No abnormalities found.   MMSE - Mini Mental State Exam 06/11/2017 06/06/2016 06/06/2016  Not completed: - (No Data) (No Data)  Orientation to time 5 - -  Orientation to Place 5 - -  Registration 3 - -  Attention/ Calculation 5 - -  Recall 2 - -  Language- name 2 objects 2 - -  Language- repeat 1 - -  Language- follow 3 step command 3 - -  Language- read & follow direction 1 - -  Write a sentence 1 - -  Copy design 1 - -  Total score 29 - -        Immunizations Immunization History  Administered Date(s) Administered   Fluad Quad(high Dose 65+) 06/19/2019, 06/13/2020, 07/10/2021   Influenza Split 07/02/2011, 07/07/2012   Influenza Whole 05/31/2008, 07/12/2009, 06/27/2010   Influenza, High Dose Seasonal PF 06/26/2013, 06/22/2014, 06/29/2015, 06/06/2016, 06/11/2017, 06/11/2018, 07/20/2020   PFIZER(Purple Top)SARS-COV-2 Vaccination 10/02/2019, 10/23/2019, 06/17/2020   Pneumococcal Conjugate-13 12/23/2014   Pneumococcal Polysaccharide-23 12/22/2013, 01/31/2017, 07/21/2019, 07/20/2020   Tdap 11/08/2004, 12/12/2014    TDAP status: Up to date  Flu  Vaccine status: Up to date  Pneumococcal vaccine status: Up to date  Covid-19 vaccine status: Completed vaccines  Qualifies for Shingles Vaccine? Yes   Zostavax completed Yes   Shingrix Completed?: Yes  Screening Tests Health Maintenance  Topic Date Due   Zoster Vaccines- Shingrix (1 of 2) Never done   FOOT EXAM  06/11/2018   OPHTHALMOLOGY EXAM  09/17/2018   COVID-19 Vaccine (4 - Booster for Pfizer series) 08/12/2020   HEMOGLOBIN A1C  02/20/2022   TETANUS/TDAP  12/11/2024   Pneumonia Vaccine 52+ Years old  Completed   INFLUENZA VACCINE  Completed   HPV VACCINES  Aged Out    Health Maintenance  Health Maintenance Due  Topic Date Due   Zoster Vaccines- Shingrix (1 of 2) Never done   FOOT EXAM  06/11/2018   OPHTHALMOLOGY EXAM  09/17/2018   COVID-19 Vaccine (4 - Booster for Pfizer series) 08/12/2020    Colorectal cancer screening: No longer required.   Lung Cancer Screening: (Low Dose CT Chest recommended if Age 106-80 years, 30 pack-year currently smoking OR have quit w/in 15years.) does not qualify.   Lung Cancer Screening Referral: n/a  Additional Screening:  Hepatitis C Screening: does not qualify;  Vision Screening: Recommended annual ophthalmology exams for early detection of glaucoma and other disorders of the eye. Is the patient up to date with their annual eye exam?  Yes  Who is the provider or what is the name of the office in which the patient attends annual eye exams? [facility]  If pt is not established with a provider, would they like to be referred to a provider to establish care? No .  Dental Screening: Recommended annual dental exams for proper oral hygiene  Community Resource Referral / Chronic Care Management: CRR required this visit?  No   CCM required this visit?  No      Plan:     I have personally reviewed and noted the following in the patients chart:   Medical and social history Use of alcohol, tobacco or illicit drugs   Current medications and supplements including opioid prescriptions. Patient is not currently taking opioid prescriptions. Functional ability and status Nutritional status Physical activity Advanced directives List of other physicians Hospitalizations, surgeries, and ER visits in previous 12 months Vitals Screenings to include cognitive, depression, and falls Referrals and appointments  In addition, I have reviewed and discussed with patient certain preventive protocols, quality metrics, and best practice recommendations. A written personalized care plan for preventive services as well as general preventive health recommendations were provided to patient.     Randel Pigg, LPN   [id number]   Nurse Notes: none

## 2021-10-22 NOTE — Progress Notes (Signed)
Cardiology Office Note:    Date:  10/25/2021   ID:  Ronnie Boyd, DOB [birth year], MRN [id number]  PCP:  Ronnie Sanes, MD  Cardiologist:  Ronnie Noe, MD   Referring MD: Ronnie Sanes, MD   Chief Complaint  Patient presents with   Coronary Artery Disease   Cardiac Valve Problem    Aortic stenosis    History of Present Illness:    Ronnie Boyd is a 88 y.o. male with a hx of severe aortic stenosis documented by recent echo, CAD with stents in the mid LAD, distal LAD, and ramus intermedius, hypertension, diabetes II, peripheral arterial disease, obstructive sleep apnea, and acute on chronic diastolic HF 02/16/2021.  Musculoskeletal discomfort statin intolerance to Lipitor, pravastatin, and Crestor.   He feels better.  He has not had syncope, or angina.  He has exertional fatigue.  With increased diuresis, dyspnea and swelling has improved.  He can walk 400 feet before his legs become real heavy.  He then has to stop and rest.  This is slightly better compared to prior, before increasing intensity of diuretic therapy.  He is annoyed by the increased urinary frequency.  Does not want to be on a higher dose of diuretic therapy.  He frequently has urination and has to change clothes.  Past Medical History:  Diagnosis Date   Allergic rhinitis    Aortic stenosis    mild echo 7/14   Borderline type 2 diabetes mellitus    Coronary artery disease    Degenerative joint disease    Family history of adverse reaction to anesthesia    "daughter had problems when she was a teen; none since; it was related to asthma"   GERD (gastroesophageal reflux disease)    Hemorrhoids    History of hiatal hernia    "dx'd years ago" (06/01/2015)   Hypercholesteremia    Hypertension    OSA on CPAP since 05/23/2015   Prostatitis    hx of   PVD (peripheral vascular disease) (HCC)    Stroke [facility])     Past Surgical History:  Procedure Laterality Date   CARDIAC CATHETERIZATION [name] 03/18/2015    Procedure: Left Heart Cath and Coronary Angiography;  Surgeon: Ronnie Morale, MD;  Location: [facility] INVASIVE CV LAB;  Service: Cardiovascular;  Laterality: [name];   CARDIAC CATHETERIZATION [name] 06/01/2015   Procedure: Coronary Stent Intervention;  Surgeon: Ronnie Ouch, MD;  Location: [facility] INVASIVE CV LAB;  Service: Cardiovascular;  Laterality: [name];   CARDIAC CATHETERIZATION  11/11/2015   CARDIAC CATHETERIZATION [name] 11/11/2015   Procedure: Intravascular Pressure Wire/FFR Study;  Surgeon: Ronnie Bollman, MD;  Location: [facility] INVASIVE CV LAB;  Service: Cardiovascular;  Laterality: [name];   CARPAL TUNNEL RELEASE Bilateral 2006-2007   CATARACT EXTRACTION W/ INTRAOCULAR LENS  IMPLANT, BILATERAL Bilateral ~ 2003   EXCISIONAL HEMORRHOIDECTOMY  1989    Current Medications: Current Meds  Medication Sig   albuterol (PROVENTIL HFA;VENTOLIN HFA) 108 (90 Base) MCG/ACT inhaler Inhale 2 puffs into the lungs every 6 (six) hours as needed for wheezing or shortness of breath.   amLODipine (NORVASC) 5 MG tablet TAKE 1 TABLET BY MOUTH EVERY DAY   aspirin 81 MG chewable tablet Chew by mouth daily.   clopidogrel (PLAVIX) 75 MG tablet TAKE 1 TABLET BY MOUTH EVERY DAY   Coenzyme Q10 200 MG capsule Take 200 mg by mouth daily.    fluticasone (FLONASE) 50 MCG/ACT nasal spray Place 2 sprays into both nostrils daily.  furosemide (LASIX) 40 MG tablet Take 1.5 tablets (60 mg total) by mouth daily.   Garlic Oil 1000 MG CAPS Take 1 capsule by mouth daily.   isosorbide mononitrate (IMDUR) 60 MG 24 hr tablet TAKE 1 TABLET BY MOUTH EVERY DAY   loratadine (CLARITIN) 10 MG tablet Take 10 mg by mouth daily as needed for rhinitis.    losartan-hydrochlorothiazide (HYZAAR) 50-12.5 MG tablet TAKE 1 TABLET BY MOUTH EVERY DAY   metoprolol tartrate (LOPRESSOR) 25 MG tablet TAKE 1 TABLET BY MOUTH TWICE A DAY   Multiple Vitamins-Minerals (CENTRUM SILVER) tablet Take 1 tablet by mouth daily.   nitroGLYCERIN (NITROSTAT) 0.4 MG SL tablet Place 1  tablet (0.4 mg total) under the tongue every 5 (five) minutes as needed for chest pain.   Olopatadine HCl 0.2 % SOLN    Omega-3 Fatty Acids (FISH OIL) 1000 MG CAPS Take 1 capsule by mouth daily.   pantoprazole (PROTONIX) 40 MG tablet TAKE 1 TABLET (40 MG TOTAL) BY MOUTH DAILY.   ranolazine (RANEXA) 500 MG 12 hr tablet TAKE 1 TABLET BY MOUTH TWICE A DAY   rosuvastatin (CRESTOR) 5 MG tablet TAKE 1 TABLET (5 MG TOTAL) BY MOUTH EVERY MONDAY, WEDNESDAY, AND FRIDAY.   tamsulosin (FLOMAX) 0.4 MG CAPS capsule Take 0.4 mg by mouth daily.     Allergies:   Crestor [rosuvastatin calcium], Lipitor [atorvastatin], Other, and Pravastatin   Social History   Socioeconomic History   Marital status: Married    Spouse name: Not on file   Number of children: Not on file   Years of education: Not on file   Highest education level: Not on file  Occupational History   Occupation: retired  Tobacco Use   Smoking status: Never   Smokeless tobacco: Never  Vaping Use   Vaping Use: Never used  Substance and Sexual Activity   Alcohol use: No   Drug use: No   Sexual activity: Not Currently  Other Topics Concern   Not on file  Social History Narrative   Daughter is Mrs. Ronnie Boyd Psychologist, sport and exercise(ACES director at [employer])   Social Determinants of Corporate investment bankerHealth   Financial Resource Strain: Low Risk    Difficulty of Paying Living Expenses: Not hard at all  Food Insecurity: No Food Insecurity   Worried About Programme researcher, broadcasting/film/videounning Out of Food in the Last Year: Never true   Baristaan Out of Food in the Last Year: Never true  Transportation Needs: No Transportation Needs   Lack of Transportation (Medical): No   Lack of Transportation (Non-Medical): No  Physical Activity: Insufficiently Active   Days of Exercise per Week: 2 days   Minutes of Exercise per Session: 20 min  Stress: No Stress Concern Present   Feeling of Stress : Not at all  Social Connections: Moderately Isolated   Frequency of Communication with Friends and Family: Three  times a week   Frequency of Social Gatherings with Friends and Family: Three times a week   Attends Religious Services: Never   Active Member of Clubs or Organizations: No   Attends Engineer, structuralClub or Organization Meetings: Never   Marital Status: Married     Family History: The patient's family history includes Arthritis in an other family member; Diabetes in his brother; Diabetes (age of onset: 5281) in his mother; Hypertension in his mother; Stroke (age of onset: 4141) in his father.  ROS:   Please see the history of present illness.    Increased urinary frequency is very annoying to him.  Would like to  be off diuretic therapy.  All other systems reviewed and are negative.  EKGs/Labs/Other Studies Reviewed:    The following studies were reviewed today: No recent imaging  EKG:  EKG not repeated  Recent Labs: 02/16/2021: Hemoglobin 11.8; Platelets 189 08/10/2021: BUN 17; Creatinine, Ser 1.22; NT-Pro BNP 3,546; Potassium 4.1; Sodium 141  Recent Lipid Panel    Component Value Date/Time   CHOL 195 05/17/2020 1331   CHOL 164 04/04/2015 1059   TRIG 51 05/17/2020 1331   TRIG 42 04/04/2015 1059   HDL 81 05/17/2020 1331   HDL 82 04/04/2015 1059   CHOLHDL 2.4 05/17/2020 1331   CHOLHDL 3 02/17/2020 1255   VLDL 9.0 02/17/2020 1255   LDLCALC 104 (H) 05/17/2020 1331   LDLCALC 74 04/04/2015 1059   LDLDIRECT 131.5 11/06/2011 1057    Physical Exam:    VS:  BP 126/62    Pulse 81    Ht 5\' 5"  (1.651 m)    Wt 153 lb 3.2 oz (69.5 kg)    SpO2 98%    BMI 25.49 kg/m     Wt Readings from Last 3 Encounters:  10/25/21 153 lb 3.2 oz (69.5 kg)  08/22/21 159 lb 12.8 oz (72.5 kg)  08/18/21 160 lb 6.4 oz (72.8 kg)    Weight is down 11 pounds since the last office visit GEN: Compatible with age and overweight. No acute distress HEENT: Normal NECK: No JVD.  Decreased carotid upstroke bilaterally LYMPHATICS: No lymphadenopathy CARDIAC: 4/6 to 5/6 crescendo decrescendo systolic murmur. RRR S4/S3 summation  gallop, or edema. VASCULAR:  Normal Pulses. No bruits. RESPIRATORY:  Clear to auscultation without rales, wheezing or rhonchi  ABDOMEN: Soft, non-tender, non-distended, No pulsatile mass, MUSCULOSKELETAL: No deformity  SKIN: Warm and dry NEUROLOGIC:  Alert and oriented x 3 PSYCHIATRIC:  Normal affect   ASSESSMENT:    1. Severe aortic stenosis   2. OSA on CPAP   3. Coronary artery disease involving native coronary artery of native heart without angina pectoris   4. Essential hypertension   5. Peripheral vascular disease (HCC)   6. HYPERCHOLESTEROLEMIA   7. Acute on chronic diastolic HF (heart failure) (HCC)    PLAN:    In order of problems listed above:  Critical aortic stenosis, symptomatic.  We have had multiple long discussions concerning natural history and treatment strategies available in 2023.  He understands that TAVR could be a possible management strategy but has always felt at his age he only limited time left.  I have told him that if he gets through TAVR without significant complications, the quality of life would be improved for the remaining measure of his life.  He understands that there is risk of pacemaker requirement, stroke, bleeding, and other.  He has not been able to decide in a positive way to have a procedure done.  He asks about whether he has crossed the threshold of being eligible for TAVR.  On this occasion with the improvement that he has gotten with diuretics,  he still has some opportunity.  He has not able to commit to proceeding with TAVR work-up.  Basic metabolic panel today Continue CPAP Notify us if angina. Blood pressure is controlled Addressed Continue statin therapy. Acute on chronic diastolic heart failure improved with diuretics.  Still volume overloaded.  Because he has urinary incontinence, he is not willing to have further increase in diuretic therapy.   Follow-up 2 to 3 months.  Notify us if significant change in overall  status  Medication Adjustments/Labs and Tests Ordered: Current medicines are reviewed at length with the patient today.  Concerns regarding medicines are outlined above.  Orders Placed This Encounter  Procedures   Pro b natriuretic peptide (BNP)   Basic metabolic panel   No orders of the defined types were placed in this encounter.   Patient Instructions  Medication Instructions:  Your physician recommends that you continue on your current medications as directed. Please refer to the Current Medication list given to you today.  *If you need a refill on your cardiac medications before your next appointment, please call your pharmacy*   Lab Work: TODAY: BMET, Pro-BNP If you have labs (blood work) drawn today and your tests are completely normal, you will receive your results only by: MyChart Message (if you have MyChart) OR A paper copy in the mail If you have any lab test that is abnormal or we need to change your treatment, we will call you to review the results.   Follow-Up: At [facility], you and your health needs are our priority.  As part of our continuing mission to provide you with exceptional heart care, we have created designated Provider Care Teams.  These Care Teams include your primary Cardiologist (physician) and Advanced Practice Providers (APPs -  Physician Assistants and Nurse Practitioners) who all work together to provide you with the care you need, when you need it.  We recommend signing up for the patient portal called "MyChart".  Sign up information is provided on this After Visit Summary.  MyChart is used to connect with patients for Virtual Visits (Telemedicine).  Patients are able to view lab/test results, encounter notes, upcoming appointments, etc.  Non-urgent messages can be sent to your provider as well.   To learn more about what you can do with MyChart, go to [email or web address].    Your next appointment:   8-12 week(s)  The format for your  next appointment:   In Person  Provider:   Lesleigh Noe, MD {    Signed, Ronnie Noe, MD  10/25/2021 4:20 PM    [facility]

## 2021-10-24 ENCOUNTER — Telehealth: Payer: Self-pay

## 2021-10-24 NOTE — Telephone Encounter (Signed)
Received fax from [facility] of pt CPAP set up confirmation. I called pt to sch his initial CPAP f/u but pt reports not having a CPAP machine. He had an appt last Tuesday 10/17/21 that he missed due to family sickness. I informed pt to call [facility] and reschedule his appt so he can start CPAP. Pt verbalized understanding and  had no further questions at this time. I will be messaging [facility] to verify information.

## 2021-10-25 ENCOUNTER — Encounter: Payer: Self-pay | Admitting: Interventional Cardiology

## 2021-10-25 ENCOUNTER — Other Ambulatory Visit: Payer: Self-pay

## 2021-10-25 ENCOUNTER — Encounter: Payer: Self-pay | Admitting: *Deleted

## 2021-10-25 ENCOUNTER — Ambulatory Visit: Payer: Medicare PPO | Admitting: Interventional Cardiology

## 2021-10-25 VITALS — BP 126/62 | HR 81 | Ht 65.0 in | Wt 153.2 lb

## 2021-10-25 DIAGNOSIS — I5033 Acute on chronic diastolic (congestive) heart failure: Secondary | ICD-10-CM

## 2021-10-25 DIAGNOSIS — I10 Essential (primary) hypertension: Secondary | ICD-10-CM | POA: Diagnosis not present

## 2021-10-25 DIAGNOSIS — I350 Nonrheumatic aortic (valve) stenosis: Secondary | ICD-10-CM | POA: Diagnosis not present

## 2021-10-25 DIAGNOSIS — E7800 Pure hypercholesterolemia, unspecified: Secondary | ICD-10-CM | POA: Diagnosis not present

## 2021-10-25 DIAGNOSIS — I2510 Atherosclerotic heart disease of native coronary artery without angina pectoris: Secondary | ICD-10-CM | POA: Diagnosis not present

## 2021-10-25 DIAGNOSIS — Z9989 Dependence on other enabling machines and devices: Secondary | ICD-10-CM | POA: Diagnosis not present

## 2021-10-25 DIAGNOSIS — I739 Peripheral vascular disease, unspecified: Secondary | ICD-10-CM | POA: Diagnosis not present

## 2021-10-25 DIAGNOSIS — G4733 Obstructive sleep apnea (adult) (pediatric): Secondary | ICD-10-CM

## 2021-10-25 NOTE — Patient Instructions (Signed)
Medication Instructions:  Your physician recommends that you continue on your current medications as directed. Please refer to the Current Medication list given to you today.  *If you need a refill on your cardiac medications before your next appointment, please call your pharmacy*   Lab Work: TODAY: BMET, Pro-BNP If you have labs (blood work) drawn today and your tests are completely normal, you will receive your results only by: [place] (if you have MyChart) OR A paper copy in the mail If you have any lab test that is abnormal or we need to change your treatment, we will call you to review the results.   Follow-Up: At [facility], you and your health needs are our priority.  As part of our continuing mission to provide you with exceptional heart care, we have created designated Provider Care Teams.  These Care Teams include your primary Cardiologist (physician) and Advanced Practice Providers (APPs -  Physician Assistants and Nurse Practitioners) who all work together to provide you with the care you need, when you need it.  We recommend signing up for the patient portal called "MyChart".  Sign up information is provided on this After Visit Summary.  MyChart is used to connect with patients for Virtual Visits (Telemedicine).  Patients are able to view lab/test results, encounter notes, upcoming appointments, etc.  Non-urgent messages can be sent to your provider as well.   To learn more about what you can do with MyChart, go to [email or web address].    Your next appointment:   8-12 week(s)  The format for your next appointment:   In Person  Provider:   Sinclair Grooms, MD {

## 2021-10-26 LAB — BASIC METABOLIC PANEL
BUN/Creatinine Ratio: 14 (ref 10–24)
BUN: 22 mg/dL (ref 10–36)
CO2: 23 mmol/L (ref 20–29)
Calcium: 9.3 mg/dL (ref 8.6–10.2)
Chloride: 104 mmol/L (ref 96–106)
Creatinine, Ser: 1.56 mg/dL — ABNORMAL HIGH (ref 0.76–1.27)
Glucose: 92 mg/dL (ref 70–99)
Potassium: 4.4 mmol/L (ref 3.5–5.2)
Sodium: 142 mmol/L (ref 134–144)
eGFR: 41 mL/min/{1.73_m2} — ABNORMAL LOW (ref >59)

## 2021-10-26 LAB — PRO B NATRIURETIC PEPTIDE: NT-Pro BNP: 6937 pg/mL — ABNORMAL HIGH (ref 0–486)

## 2022-01-06 NOTE — Progress Notes (Signed)
?Cardiology Office Note:   ? ?Date:  01/08/2022  ? ?ID:  Ronnie Boyd, DOB 11-26-1926, MRN [id number] ? ?PCP:  Pincus Sanes, MD  ?Cardiologist:  Lesleigh Noe, MD  ? ?Referring MD: Pincus Sanes, MD  ? ?Chief Complaint  ?Patient presents with  ? Coronary Artery Disease  ? Cardiac Valve Problem  ? Hypertension  ? Hyperlipidemia  ? ? ?History of Present Illness:   ? ?Ronnie Boyd is a 88 y.o. male with a hx of  severe aortic stenosis documented by recent echo, CAD with stents in the mid LAD, distal LAD, and ramus intermedius, hypertension, diabetes II, peripheral arterial disease, obstructive sleep apnea, and acute on chronic diastolic HF 02/16/2021.  Musculoskeletal discomfort statin intolerance to Lipitor, pravastatin, and Crestor. ? ? ?Breathing is not significantly changed.  He denies significant change in mild lower extremity swelling.  He is still sleeping in his bed.  He sleeps on 1 pillow.  He still walks to the mailbox.  He is taking medications as listed.  Appetite is not the best but he does eat every day.  Neither he or his wife actually prepare food anymore.  They are dependent upon a daughter who brings food and and snacks in for them to eat.  They were getting Meals on Wheels but stopped because the food was very salty. ? ?Past Medical History:  ?Diagnosis Date  ? Allergic rhinitis   ? Aortic stenosis   ? mild echo 7/14  ? Borderline type 2 diabetes mellitus   ? Coronary artery disease   ? Degenerative joint disease   ? Family history of adverse reaction to anesthesia   ? "daughter had problems when she was a teen; none since; it was related to asthma"  ? GERD (gastroesophageal reflux disease)   ? Hemorrhoids   ? History of hiatal hernia   ? "dx'd years ago" (06/01/2015)  ? Hypercholesteremia   ? Hypertension   ? OSA on CPAP since 05/23/2015  ? Prostatitis   ? hx of  ? PVD (peripheral vascular disease) (HCC)   ? Stroke [facility])   ? ? ?Past Surgical History:  ?Procedure Laterality Date  ? CARDIAC  CATHETERIZATION [name] 03/18/2015  ? Procedure: Left Heart Cath and Coronary Angiography;  Surgeon: Laurey Morale, MD;  Location: [facility] INVASIVE CV LAB;  Service: Cardiovascular;  Laterality: [name];  ? CARDIAC CATHETERIZATION [name] 06/01/2015  ? Procedure: Coronary Stent Intervention;  Surgeon: Iran Ouch, MD;  Location: [facility] INVASIVE CV LAB;  Service: Cardiovascular;  Laterality: [name];  ? CARDIAC CATHETERIZATION  11/11/2015  ? CARDIAC CATHETERIZATION [name] 11/11/2015  ? Procedure: Intravascular Pressure Wire/FFR Study;  Surgeon: Tonny Bollman, MD;  Location: [facility] INVASIVE CV LAB;  Service: Cardiovascular;  Laterality: [name];  ? CARPAL TUNNEL RELEASE Bilateral 2006-2007  ? CATARACT EXTRACTION W/ INTRAOCULAR LENS  IMPLANT, BILATERAL Bilateral ~ 2003  ? EXCISIONAL HEMORRHOIDECTOMY  1989  ? ? ?Current Medications: ?Current Meds  ?Medication Sig  ? albuterol (PROVENTIL HFA;VENTOLIN HFA) 108 (90 Base) MCG/ACT inhaler Inhale 2 puffs into the lungs every 6 (six) hours as needed for wheezing or shortness of breath.  ? amLODipine (NORVASC) 5 MG tablet TAKE 1 TABLET BY MOUTH EVERY DAY  ? aspirin 81 MG chewable tablet Chew by mouth daily.  ? clopidogrel (PLAVIX) 75 MG tablet TAKE 1 TABLET BY MOUTH EVERY DAY  ? Coenzyme Q10 200 MG capsule Take 200 mg by mouth daily.   ? fluticasone (FLONASE) 50 MCG/ACT nasal spray  Place 2 sprays into both nostrils daily.  ? furosemide (LASIX) 40 MG tablet Take 1.5 tablets (60 mg total) by mouth daily.  ? Garlic Oil 1000 MG CAPS Take 1 capsule by mouth daily.  ? isosorbide mononitrate (IMDUR) 60 MG 24 hr tablet TAKE 1 TABLET BY MOUTH EVERY DAY  ? loratadine (CLARITIN) 10 MG tablet Take 10 mg by mouth daily as needed for rhinitis.   ? losartan-hydrochlorothiazide (HYZAAR) 50-12.5 MG tablet TAKE 1 TABLET BY MOUTH EVERY DAY  ? metoprolol tartrate (LOPRESSOR) 25 MG tablet TAKE 1 TABLET BY MOUTH TWICE A DAY  ? Multiple Vitamins-Minerals (CENTRUM SILVER) tablet Take 1 tablet by mouth daily.  ? nitroGLYCERIN  (NITROSTAT) 0.4 MG SL tablet Place 1 tablet (0.4 mg total) under the tongue every 5 (five) minutes as needed for chest pain.  ? Olopatadine HCl 0.2 % SOLN   ? Omega-3 Fatty Acids (FISH OIL) 1000 MG CAPS Take 1 capsule by mouth daily.  ? pantoprazole (PROTONIX) 40 MG tablet TAKE 1 TABLET (40 MG TOTAL) BY MOUTH DAILY.  ? ranolazine (RANEXA) 500 MG 12 hr tablet TAKE 1 TABLET BY MOUTH TWICE A DAY  ? rosuvastatin (CRESTOR) 5 MG tablet TAKE 1 TABLET (5 MG TOTAL) BY MOUTH EVERY MONDAY, WEDNESDAY, AND FRIDAY.  ? tamsulosin (FLOMAX) 0.4 MG CAPS capsule Take 0.4 mg by mouth daily.  ?  ? ?Allergies:   Crestor [rosuvastatin calcium], Lipitor [atorvastatin], Other, and Pravastatin  ? ?Social History  ? ?Socioeconomic History  ? Marital status: Married  ?  Spouse name: Not on file  ? Number of children: Not on file  ? Years of education: Not on file  ? Highest education level: Not on file  ?Occupational History  ? Occupation: retired  ?Tobacco Use  ? Smoking status: Never  ? Smokeless tobacco: Never  ?Vaping Use  ? Vaping Use: Never used  ?Substance and Sexual Activity  ? Alcohol use: No  ? Drug use: No  ? Sexual activity: Not Currently  ?Other Topics Concern  ? Not on file  ?Social History Narrative  ? Daughter is [name] Psychologist, sport and exercise at [employer])  ? ?Social Determinants of Health  ? ?Financial Resource Strain: Low Risk   ? Difficulty of Paying Living Expenses: Not hard at all  ?Food Insecurity: No Food Insecurity  ? Worried About Programme researcher, broadcasting/film/video in the Last Year: Never true  ? Ran Out of Food in the Last Year: Never true  ?Transportation Needs: No Transportation Needs  ? Lack of Transportation (Medical): No  ? Lack of Transportation (Non-Medical): No  ?Physical Activity: Insufficiently Active  ? Days of Exercise per Week: 2 days  ? Minutes of Exercise per Session: 20 min  ?Stress: No Stress Concern Present  ? Feeling of Stress : Not at all  ?Social Connections: Moderately Isolated  ? Frequency of  Communication with Friends and Family: Three times a week  ? Frequency of Social Gatherings with Friends and Family: Three times a week  ? Attends Religious Services: Never  ? Active Member of Clubs or Organizations: No  ? Attends Banker Meetings: Never  ? Marital Status: Married  ?  ? ?Family History: ?The patient's family history includes Arthritis in an other family member; Diabetes in his brother; Diabetes (age of onset: 74) in his mother; Hypertension in his mother; Stroke (age of onset: 32) in his father. ? ?ROS:   ?Please see the history of present illness.    ?He has not  had syncope.  He is having some difficulty with a right submandibular firm nodule.  He had same thing about 10 years ago and states that Dr. Thayer Ohmhris Guest expressed secretions from the area that went into his mouth.  He placed him on antibiotics and things have been well until now.  I recommended that he speak to primary care concerning this.  All other systems reviewed and are negative. ? ?EKGs/Labs/Other Studies Reviewed:   ? ?The following studies were reviewed today: ? ?ECHOCARDIOGRAPHY 04/08/2019: ?IMPRESSIONS  ? 1. The left ventricle has hyperdynamic systolic function, with an  ?ejection fraction of >65%. The cavity size was normal. There is moderately  ?increased left ventricular wall thickness. Left ventricular diastolic  ?Doppler parameters are consistent with  ?impaired relaxation. Elevated left atrial and left ventricular  ?end-diastolic pressures The E/e' is >20. No evidence of left ventricular  ?regional wall motion abnormalities.  ? 2. The right ventricle has normal systolic function. The cavity was  ?normal. There is no increase in right ventricular wall thickness.  ? 3. Left atrial size was mildly dilated.  ? 4. The mitral valve is abnormal. Mild thickening of the mitral valve  ?leaflet. There is mild mitral annular calcification present.  ? 5. The tricuspid valve is grossly normal.  ? 6. The aortic valve has an  indeterminate number of cusps. Severe  ?calcifcation of the aortic valve. Aortic valve regurgitation is mild by  ?color flow Doppler. Severe stenosis of the aortic valve.  ? 7. The aorta is normal in size and structure.  ? ?E

## 2022-01-08 ENCOUNTER — Ambulatory Visit: Payer: Medicare PPO | Admitting: Interventional Cardiology

## 2022-01-08 ENCOUNTER — Encounter: Payer: Self-pay | Admitting: Interventional Cardiology

## 2022-01-08 VITALS — BP 110/60 | HR 69 | Ht 65.0 in | Wt 150.4 lb

## 2022-01-08 DIAGNOSIS — Z9989 Dependence on other enabling machines and devices: Secondary | ICD-10-CM | POA: Diagnosis not present

## 2022-01-08 DIAGNOSIS — I739 Peripheral vascular disease, unspecified: Secondary | ICD-10-CM | POA: Diagnosis not present

## 2022-01-08 DIAGNOSIS — I2510 Atherosclerotic heart disease of native coronary artery without angina pectoris: Secondary | ICD-10-CM

## 2022-01-08 DIAGNOSIS — I10 Essential (primary) hypertension: Secondary | ICD-10-CM

## 2022-01-08 DIAGNOSIS — I350 Nonrheumatic aortic (valve) stenosis: Secondary | ICD-10-CM

## 2022-01-08 DIAGNOSIS — I5033 Acute on chronic diastolic (congestive) heart failure: Secondary | ICD-10-CM

## 2022-01-08 DIAGNOSIS — E7800 Pure hypercholesterolemia, unspecified: Secondary | ICD-10-CM

## 2022-01-08 DIAGNOSIS — G4733 Obstructive sleep apnea (adult) (pediatric): Secondary | ICD-10-CM

## 2022-01-08 NOTE — Patient Instructions (Signed)
Medication Instructions:  ?Your physician recommends that you continue on your current medications as directed. Please refer to the Current Medication list given to you today. ? ?*If you need a refill on your cardiac medications before your next appointment, please call your pharmacy* ? ?Lab Work: ?TODAY: BMET, CBC ?If you have labs (blood work) drawn today and your tests are completely normal, you will receive your results only by: ?MyChart Message (if you have MyChart) OR ?A paper copy in the mail ?If you have any lab test that is abnormal or we need to change your treatment, we will call you to review the results. ? ?Testing/Procedures: ?NONE ? ?Follow-Up: ?At [facility], you and your health needs are our priority.  As part of our continuing mission to provide you with exceptional heart care, we have created designated Provider Care Teams.  These Care Teams include your primary Cardiologist (physician) and Advanced Practice Providers (APPs -  Physician Assistants and Nurse Practitioners) who all work together to provide you with the care you need, when you need it. ? ?Your next appointment:   ?3 month(s) ? ?The format for your next appointment:   ?In Person ? ?Provider:   ?Lesleigh Noe, MD { ? ? ?Important Information About Sugar ? ? ? ? ?  ?

## 2022-01-09 LAB — BASIC METABOLIC PANEL
BUN/Creatinine Ratio: 16 (ref 10–24)
BUN: 23 mg/dL (ref 10–36)
CO2: 22 mmol/L (ref 20–29)
Calcium: 9.2 mg/dL (ref 8.6–10.2)
Chloride: 106 mmol/L (ref 96–106)
Creatinine, Ser: 1.46 mg/dL — ABNORMAL HIGH (ref 0.76–1.27)
Glucose: 89 mg/dL (ref 70–99)
Potassium: 4.2 mmol/L (ref 3.5–5.2)
Sodium: 140 mmol/L (ref 134–144)
eGFR: 45 mL/min/{1.73_m2} — ABNORMAL LOW (ref >59)

## 2022-01-09 LAB — CBC
Hematocrit: 37.1 % — ABNORMAL LOW (ref 37.5–51.0)
Hemoglobin: 11.8 g/dL — ABNORMAL LOW (ref 13.0–17.7)
MCH: 24.5 pg — ABNORMAL LOW (ref 26.6–33.0)
MCHC: 31.8 g/dL (ref 31.5–35.7)
MCV: 77 fL — ABNORMAL LOW (ref 79–97)
Platelets: 189 10*3/uL (ref 150–450)
RBC: 4.82 x10E6/uL (ref 4.14–5.80)
RDW: 17.1 % — ABNORMAL HIGH (ref 11.6–15.4)
WBC: 5.1 10*3/uL (ref 3.4–10.8)

## 2022-01-26 ENCOUNTER — Telehealth: Payer: Self-pay | Admitting: Interventional Cardiology

## 2022-01-26 NOTE — Telephone Encounter (Signed)
Pt's daughter states that she has noticed pt having "labored" breathing at times. Daughter is wanting to know if Dr. Katrinka Blazing thinks that if would be helpful if pt had Oxygen in the house for when this happens. Please advise

## 2022-01-26 NOTE — Telephone Encounter (Signed)
Spoke with the patient's daughter who states that they have been talking with Dr. Katrinka Blazing about the patient's breathing. She states that she was wondering if the patient might need some supplemental oxygen. She states that the patient has not had any worsening of symptoms but just wanted to check. She still notices some labored breathing when he naps. He will lean over on the table or bed at times to help ease some of his breathing issues. I advised her that she could get a pulse oximeter to monitor his oxygen levels to see if it is running low. His O2 in the office has been 98% the past couple of visits.

## 2022-01-28 ENCOUNTER — Other Ambulatory Visit: Payer: Self-pay | Admitting: Internal Medicine

## 2022-01-29 NOTE — Telephone Encounter (Signed)
   Ronnie Boyd is calling back, she said over the weekend, pt is more fatigue he is napping more, he get winded more. They made an appt with Dr. Katrinka Blazing on 05/25 but checking in if pt can come in sooner

## 2022-01-29 NOTE — Telephone Encounter (Signed)
Spoke with patient's daughter Ronnie Boyd ([place] per [facility]).  Ronnie Boyd reports patient has been experiencing more fatigue and SOB over the last week. She states she has noticed him having "labored breathing" while napping. She reports he is napping more, and having to lean over table/bed to help his breathing.   Patient had an appt on 5/25 at 8:00AM scheduled, but Ronnie Boyd states she is not able to get patient to office that early. Offered new appt for 5/23 at 2:40PM, Ronnie Boyd accepted and states this would work out much better.  Patient to see Dr. Katrinka Blazing 5/23 at 2:40PM to evaluate increased fatigue/SOB.

## 2022-01-29 NOTE — Progress Notes (Unsigned)
Cardiology Office Note:    Date:  01/29/2022   ID:  Ronnie Boyd, DOB 1927/02/04, MRN [id number]  PCP:  Binnie Rail, MD  Cardiologist:  Sinclair Grooms, MD   Referring MD: Binnie Rail, MD   No chief complaint on file.   History of Present Illness:    TENNIS FAHEY is a 88 y.o. male with a hx of  severe aortic stenosis documented by recent echo, CAD with stents in the mid LAD, distal LAD, and ramus intermedius, hypertension, diabetes II, peripheral arterial disease, obstructive sleep apnea, and acute on chronic diastolic HF [id number].  Musculoskeletal discomfort statin intolerance to Lipitor, pravastatin, and Crestor.  Concern on 01/26/2022: "Spoke with patient's daughter Ronnie Boyd ([place] per [facility]).   Ronnie Boyd reports patient has been experiencing more fatigue and SOB over the last week. She states she has noticed him having "labored breathing" while napping. She reports he is napping more, and having to lean over table/bed to help his breathing.    Patient had an appt on 5/25 at 8:00AM scheduled, but Ronnie Boyd states she is not able to get patient to office that early. Offered new appt for 5/23 at 2:40PM, Ronnie Boyd accepted and states this would work out much better.   Patient to see Dr. Tamala Julian 5/23 at 2:40PM to evaluate increased fatigue/SOB."  Noticeably short of breath.  Despite being in heart failure with lower extremity swelling, he continues to lose weight.  Appetite is been dwindling.  He appears particularly frail.  Having a difficult time lying down to sleep at night.  We had a prolonged conversation concerning goals of care.  We talked about DO NOT RESUSCITATE order.  He did not consent to this.  He has a power of attorney that he wants to discuss it with.  I tried to educate him that CPR and advanced life support in this setting would not provide him any quality of life but would be painful and unnecessary.  He will agree to palliative care consultation and guidance.  Past  Medical History:  Diagnosis Date   Allergic rhinitis    Aortic stenosis    mild echo 7/14   Borderline type 2 diabetes mellitus    Coronary artery disease    Degenerative joint disease    Family history of adverse reaction to anesthesia    "daughter had problems when she was a teen; none since; it was related to asthma"   GERD (gastroesophageal reflux disease)    Hemorrhoids    History of hiatal hernia    "dx'd years ago" (06/01/2015)   Hypercholesteremia    Hypertension    OSA on CPAP since 05/23/2015   Prostatitis    hx of   PVD (peripheral vascular disease) ([place])    Stroke [facility])     Past Surgical History:  Procedure Laterality Date   CARDIAC CATHETERIZATION [name] 03/18/2015   Procedure: Left Heart Cath and Coronary Angiography;  Surgeon: Larey Dresser, MD;  Location: [place] [facility];  Service: Cardiovascular;  Laterality: [name];   CARDIAC CATHETERIZATION [name] 06/01/2015   Procedure: Coronary Stent Intervention;  Surgeon: Wellington Hampshire, MD;  Location: [place] [facility];  Service: Cardiovascular;  Laterality: [name];   CARDIAC CATHETERIZATION  11/11/2015   CARDIAC CATHETERIZATION [name] 11/11/2015   Procedure: Intravascular Pressure Wire/FFR Study;  Surgeon: Sherren Mocha, MD;  Location: [place] [facility];  Service: Cardiovascular;  Laterality: [name];   CARPAL TUNNEL RELEASE Bilateral 2006-2007   CATARACT EXTRACTION W/ INTRAOCULAR  LENS  IMPLANT, BILATERAL Bilateral ~ 2003   EXCISIONAL HEMORRHOIDECTOMY  1989    Current Medications: No outpatient medications have been marked as taking for the 01/30/22 encounter (Appointment) with Belva Crome, MD.     Allergies:   Crestor [rosuvastatin calcium], Lipitor [atorvastatin], Other, and Pravastatin   Social History   Socioeconomic History   Marital status: Married    Spouse name: Not on file   Number of children: Not on file   Years of education: Not on file   Highest education level: Not on file  Occupational History   Occupation:  retired  Tobacco Use   Smoking status: Never   Smokeless tobacco: Never  Vaping Use   Vaping Use: Never used  Substance and Sexual Activity   Alcohol use: No   Drug use: No   Sexual activity: Not Currently  Other Topics Concern   Not on file  Social History Narrative   Daughter is Mrs. Juliane Lack Runner, broadcasting/film/video at [employer])   Social Determinants of Radio broadcast assistant Strain: Low Risk    Difficulty of Paying Living Expenses: Not hard at all  Food Insecurity: No Food Insecurity   Worried About Charity fundraiser in the Last Year: Never true   Arboriculturist in the Last Year: Never true  Transportation Needs: No Transportation Needs   Lack of Transportation (Medical): No   Lack of Transportation (Non-Medical): No  Physical Activity: Insufficiently Active   Days of Exercise per Week: 2 days   Minutes of Exercise per Session: 20 min  Stress: No Stress Concern Present   Feeling of Stress : Not at all  Social Connections: Moderately Isolated   Frequency of Communication with Friends and Family: Three times a week   Frequency of Social Gatherings with Friends and Family: Three times a week   Attends Religious Services: Never   Active Member of Clubs or Organizations: No   Attends Music therapist: Never   Marital Status: Married     Family History: The patient's family history includes Arthritis in an other family member; Diabetes in his brother; Diabetes (age of onset: 43) in his mother; Hypertension in his mother; Stroke (age of onset: 62) in his father.  ROS:   Please see the history of present illness.    He is concerned that maybe he made the wrong decision a year ago when he decided not to go for TAVR.  Had explained to him that at his age he was not sure candidate for TAVR, but would have had to be evaluated and a decision made about whether he could tolerate it.  At that time he chose medical therapy because he wanted to help care for his  frail wife who has Parkinson's disease.  She is getting progressively worse.  All other systems reviewed and are negative.  EKGs/Labs/Other Studies Reviewed:    The following studies were reviewed today: No new imaging.    EKG:  EKG EKG is not repeated  Recent Labs: 10/25/2021: NT-Pro BNP 6,937 01/08/2022: BUN 23; Creatinine, Ser 1.46; Hemoglobin 11.8; Platelets 189; Potassium 4.2; Sodium 140  Recent Lipid Panel    Component Value Date/Time   CHOL 195 05/17/2020 1331   CHOL 164 04/04/2015 1059   TRIG 51 05/17/2020 1331   TRIG 42 04/04/2015 1059   HDL 81 05/17/2020 1331   HDL 82 04/04/2015 1059   CHOLHDL 2.4 05/17/2020 1331   CHOLHDL 3 02/17/2020 1255   VLDL  9.0 02/17/2020 1255   LDLCALC 104 (H) 05/17/2020 1331   LDLCALC 74 04/04/2015 1059   LDLDIRECT 131.5 11/06/2011 1057    Physical Exam:    VS:  There were no vitals taken for this visit.    Wt Readings from Last 3 Encounters:  01/08/22 150 lb 6.4 oz (68.2 kg)  10/25/21 153 lb 3.2 oz (69.5 kg)  08/22/21 159 lb 12.8 oz (72.5 kg)     GEN: Elderly and now frail-appearing.  Despite progressive development of heart failure, he continues to lose weight at the expense of muscle mass.. No acute distress HEENT: Normal NECK: No JVD. LYMPHATICS: No lymphadenopathy CARDIAC: 4/6 to 5/6 crescendo decrescendo systolic murmur of aortic stenosis murmur. RRR no S3 gallop, but significant bilateral ankle to mid shin edema.  Edema. VASCULAR:  Normal Pulses. No bruits. RESPIRATORY: Rales one half the way up in both lungs. ABDOMEN: Soft, non-tender, non-distended, No pulsatile mass, MUSCULOSKELETAL: No deformity  SKIN: Warm and dry NEUROLOGIC:  Alert and oriented x 3 PSYCHIATRIC:  Normal affect   ASSESSMENT:    1. Nonrheumatic aortic valve stenosis   2. Acute on chronic diastolic HF (heart failure) ([place])   3. Coronary artery disease involving native coronary artery of native heart without angina pectoris   4. Essential hypertension    5. HYPERCHOLESTEROLEMIA   6. Peripheral vascular disease ([place])   7. OSA on CPAP    PLAN:    In order of problems listed above:  End-stage aortic stenosis with acute on chronic diastolic heart failure.  Discontinue amlodipine and losartan HCT to allow blood pressure room.  Increase furosemide to 80 mg twice daily.  Weight daily under the same circumstances.  Bmet, BNP, and CBC today.  After a long discussion, we decided upon a palliative care consultation.  I recommended hospice care.  I recommended DNR.  He wants to discuss all of this with his power of attorney who is his oldest daughter.  We will communicate by MyChart concerning his weights and how his clinical status response to the larger dose of oral furosemide.  If he is not responding, he still has the option to be admitted to the hospital for diuresis.  He is not a candidate for TAVR at this point.  This is made clear to the patient and his family.  This was not a surprise.   We have neoterminal relative to progression of aortic stenosis.  This was frankly discussed.  The details now revolve around [place] and whether or not to have hospice at home.  I recommended hospice.  Hopefully this will be his decision after going through palliative care conversation.  Than 60 minutes spent answering multiple questions to the patient, his daughter, and his grandson.  Medication Adjustments/Labs and Tests Ordered: Current medicines are reviewed at length with the patient today.  Concerns regarding medicines are outlined above.  No orders of the defined types were placed in this encounter.  No orders of the defined types were placed in this encounter.   There are no Patient Instructions on file for this visit.   Signed, Sinclair Grooms, MD  01/29/2022 1:07 PM    [place]

## 2022-01-30 ENCOUNTER — Ambulatory Visit (INDEPENDENT_AMBULATORY_CARE_PROVIDER_SITE_OTHER): Payer: Medicare PPO | Admitting: Interventional Cardiology

## 2022-01-30 ENCOUNTER — Encounter: Payer: Self-pay | Admitting: Interventional Cardiology

## 2022-01-30 VITALS — BP 102/58 | HR 63 | Ht 65.0 in | Wt 145.4 lb

## 2022-01-30 DIAGNOSIS — I2510 Atherosclerotic heart disease of native coronary artery without angina pectoris: Secondary | ICD-10-CM

## 2022-01-30 DIAGNOSIS — Z9989 Dependence on other enabling machines and devices: Secondary | ICD-10-CM

## 2022-01-30 DIAGNOSIS — I10 Essential (primary) hypertension: Secondary | ICD-10-CM

## 2022-01-30 DIAGNOSIS — I5033 Acute on chronic diastolic (congestive) heart failure: Secondary | ICD-10-CM

## 2022-01-30 DIAGNOSIS — G4733 Obstructive sleep apnea (adult) (pediatric): Secondary | ICD-10-CM

## 2022-01-30 DIAGNOSIS — I739 Peripheral vascular disease, unspecified: Secondary | ICD-10-CM

## 2022-01-30 DIAGNOSIS — E7800 Pure hypercholesterolemia, unspecified: Secondary | ICD-10-CM | POA: Diagnosis not present

## 2022-01-30 DIAGNOSIS — I350 Nonrheumatic aortic (valve) stenosis: Secondary | ICD-10-CM

## 2022-01-30 MED ORDER — FUROSEMIDE 80 MG PO TABS: 80.0000 mg | ORAL_TABLET | Freq: Two times a day (BID) | ORAL | 3 refills | Status: DC

## 2022-01-30 NOTE — Patient Instructions (Signed)
Medication Instructions:  Your physician has recommended you make the following change in your medication:   1) STOP Amlodidpine (Norvasc) 2) STOP Losartan-hydrochlorothiazide 3) INCREASE Furosemide (Lasix) to 80mg  twice daily  *If you need a refill on your cardiac medications before your next appointment, please call your pharmacy*  Lab Work: TODAY: BMET, BNP, Hgb If you have labs (blood work) drawn today and your tests are completely normal, you will receive your results only by: MyChart Message (if you have MyChart) OR A paper copy in the mail If you have any lab test that is abnormal or we need to change your treatment, we will call you to review the results.  Testing/Procedures: NONE  Follow-Up: At [facility], you and your health needs are our priority.  As part of our continuing mission to provide you with exceptional heart care, we have created designated Provider Care Teams.  These Care Teams include your primary Cardiologist (physician) and Advanced Practice Providers (APPs -  Physician Assistants and Nurse Practitioners) who all work together to provide you with the care you need, when you need it.  Your next appointment:   As needed  The format for your next appointment:   In Person  Provider:   [facility], MD {  Other Instructions Your physician has referred you to [facility] and Palliative Care. Their office will call you to schedule an in-home consultation. Their number is [phone].  Important Information About [place]

## 2022-01-31 ENCOUNTER — Telehealth: Payer: Self-pay

## 2022-01-31 DIAGNOSIS — Z79899 Other long term (current) drug therapy: Secondary | ICD-10-CM

## 2022-01-31 DIAGNOSIS — I10 Essential (primary) hypertension: Secondary | ICD-10-CM

## 2022-01-31 LAB — BASIC METABOLIC PANEL
BUN/Creatinine Ratio: 24 (ref 10–24)
BUN: 40 mg/dL — ABNORMAL HIGH (ref 10–36)
CO2: 19 mmol/L — ABNORMAL LOW (ref 20–29)
Calcium: 8.9 mg/dL (ref 8.6–10.2)
Chloride: 110 mmol/L — ABNORMAL HIGH (ref 96–106)
Creatinine, Ser: 1.69 mg/dL — ABNORMAL HIGH (ref 0.76–1.27)
Glucose: 99 mg/dL (ref 70–99)
Potassium: 4.1 mmol/L (ref 3.5–5.2)
Sodium: 148 mmol/L — ABNORMAL HIGH (ref 134–144)
eGFR: 38 mL/min/{1.73_m2} — ABNORMAL LOW (ref >59)

## 2022-01-31 LAB — PRO B NATRIURETIC PEPTIDE: NT-Pro BNP: 20554 pg/mL — ABNORMAL HIGH (ref 0–486)

## 2022-01-31 NOTE — Telephone Encounter (Signed)
Spoke with patient and discussed lab results.  Per Dr. Katrinka Blazing: Let the patient know the labs suggest significant worsening in heart failure.  We are doing the right thing by using higher doses of diuretic therapy.  Please weigh every morning under similar circumstances and provide that information via MyChart or a phone call to [place].  Remind him to follow through with the palliative care consult.  Consider adding hospice to help increase support at home.  Need to make a determination about DNR.  Patient verbalized understanding and reports weight this morning is 143 lbs (was 145 lbs yesterday at OV).  Advised patient to continue daily weights and call results to me to stay updated. Patient verbalized understanding.

## 2022-01-31 NOTE — Telephone Encounter (Signed)
-----   Message from Lyn Records, MD sent at 01/31/2022  8:29 AM EDT ----- Let the patient know the labs suggest significant worsening in heart failure.  We are doing the right thing by using higher doses of diuretic therapy.  Please weigh every morning under similar circumstances and provide that information via MyChart or a phone call to [place].  Remind him to follow through with the palliative care consult.  Consider adding hospice to help increase support at home.  Need to make a determination about DNR. A copy will be sent to Patrcia Dolly, DO

## 2022-02-01 ENCOUNTER — Ambulatory Visit: Payer: Medicare PPO | Admitting: Interventional Cardiology

## 2022-02-06 ENCOUNTER — Telehealth: Payer: Self-pay | Admitting: Interventional Cardiology

## 2022-02-06 NOTE — Telephone Encounter (Signed)
Spoke with patient's daughter Victorino Dike ([place] per [facility]) who reports update on patient's status.  Patient's weight stable at 143 lbs for the last 3-4 days. Last BP checked on 5/25: 134/62. Victorino Dike reports patient has had a cough since Saturday and noticed he spit up mucous with a small spot of blood noted. She states patient stopped taking ASA concerned this was the reason for blood in mucus. [facility] nurse in home at time of call to evaluate.  Victorino Dike also states patient is continuing to have occasional loose bowel movements with increased urination from increased dose of Lasix. She is asking if Dr. Katrinka Blazing would advise patient start taking a probiotic. Also, at the suggestion of the nurse from [facility], would Dr. Katrinka Blazing like to add potassium supplement since Lasix dose was doubled at last OV.  Will forward to Dr. Katrinka Blazing to review and advise.

## 2022-02-06 NOTE — Telephone Encounter (Signed)
Pt's hospice nurse is calling stating patients family wants to know if he should take potasium with new diuretic prescribed and would like to also report findings on right lower lobe with crackles.

## 2022-02-06 NOTE — Telephone Encounter (Signed)
Spoke with Raynelle Fanning, hospice nurse, and advised I would speak with Dr. Katrinka Blazing tomorrow when he is back in the office about adding a potassium supplement and make him aware of crackles in RLL.  Raynelle Fanning expressed appreciation for return call.

## 2022-02-06 NOTE — Telephone Encounter (Signed)
   Pt's daughter calling back to give pt's f/u after increasing pt's fluid pill

## 2022-02-07 ENCOUNTER — Other Ambulatory Visit: Payer: Self-pay | Admitting: Interventional Cardiology

## 2022-02-07 ENCOUNTER — Other Ambulatory Visit: Payer: Self-pay | Admitting: Cardiology

## 2022-02-07 ENCOUNTER — Telehealth: Payer: Self-pay | Admitting: Interventional Cardiology

## 2022-02-07 DIAGNOSIS — Z79899 Other long term (current) drug therapy: Secondary | ICD-10-CM

## 2022-02-07 DIAGNOSIS — I10 Essential (primary) hypertension: Secondary | ICD-10-CM

## 2022-02-07 DIAGNOSIS — I5033 Acute on chronic diastolic (congestive) heart failure: Secondary | ICD-10-CM

## 2022-02-07 MED ORDER — POTASSIUM CHLORIDE ER 10 MEQ PO TBCR: 10.0000 meq | EXTENDED_RELEASE_TABLET | Freq: Every day | ORAL | 3 refills | Status: DC

## 2022-02-07 NOTE — Telephone Encounter (Signed)
Spoke with Ronnie Boyd and discussed Ronnie Boyd recommendations for Ronnie Boyd.  Per Dr. Katrinka Boyd: Start potassium chloride QD Stop ASA, hold Plavix until blood in sputum clears up then restart Plavix BMET in 7-10 days  Ronnie Boyd verbalized understanding, patient notified.  Patient requests daughter Ronnie Boyd be called and made aware of the above. Attempted to contact Ronnie Boyd and no answer, VM full, will try again later.

## 2022-02-07 NOTE — Telephone Encounter (Signed)
Ronnie Boyd from [facility] called and mentioned that patient has been throwing up a dark red blood yesterday and this morning. Patient is taking Plavix and she wants to know she they hold off on it under further orders are given.

## 2022-02-12 NOTE — Addendum Note (Signed)
Addended by: Franchot Gallo on: 02/12/2022 04:07 PM   Modules accepted: Orders

## 2022-02-12 NOTE — Telephone Encounter (Signed)
Spoke with patient's daughter Victorino Dike who states she will call back to schedule lab appt for this week.  BMET to be obtained this week. Order in [place].

## 2022-02-13 ENCOUNTER — Telehealth: Payer: Self-pay | Admitting: Interventional Cardiology

## 2022-02-13 MED ORDER — METOLAZONE 2.5 MG PO TABS: 2.5000 mg | ORAL_TABLET | Freq: Every day | ORAL | 0 refills | Status: DC

## 2022-02-13 NOTE — Telephone Encounter (Signed)
Patient's daughter is following up. She states the patient will be having lab work with [facility] on 6/08 and she would like to know if he still needs to have it will our office depending on which panels are drawn. Please  return call to discuss.

## 2022-02-13 NOTE — Telephone Encounter (Signed)
Spoke with patient's daughter Victorino Dike regarding message from [place] who states she was unable to obtain patient's labs today.  Scheduled lab appt for patient for 02/14/22, Victorino Dike verbalized understanding.

## 2022-02-13 NOTE — Telephone Encounter (Signed)
Victorino Dike also reports patient's weight is 148 lbs today, up 5 lbs from last week. Victorino Dike states she has not noticed increased SOB or swelling. She states patient forgets to elevate feet and he continues to have low energy, but does not appear worse than when he saw Dr. Katrinka Blazing on 01/30/22. Victorino Dike reports good appetite.  Will forward update to Dr. Katrinka Blazing.

## 2022-02-13 NOTE — Telephone Encounter (Signed)
Left message on home phone for patient. Also spoke with daughter Victorino Dike to inform her that Dr. Katrinka Blazing would like for patient to take Metolazone 2.5mg  30 minutes before morning dose of Lasix tomorrow, just once to see if weight comes down.  Victorino Dike verbalized understanding. Metolazone 2.5mg  sent to pharmacy of choice.

## 2022-02-13 NOTE — Telephone Encounter (Signed)
Ronnie Boyd from [facility] called to say they attempted twice by two different nurses to get his labs drawn. They won't able to get his basic metabolic panel done.  Looks like patient will have to come in to get it done.

## 2022-02-13 NOTE — Telephone Encounter (Signed)
Spoke with patient's daughter Anderson Malta who states nurse from [place] is scheduled to come to the home to draw blood for lab this week on 02/15/22. She is asking if patient also needs to come in to our office for labs for Dr. Tamala Julian.  Also called Almyra Free, nurse with [facility], who states she is drawing BMET for Dr. Tamala Julian on 02/15/22. She states she will fax these lab results to our office at [phone].  Called patient's daughter back to let her know that Almyra Free will be drawing labs for Dr. Tamala Julian this week and he does not need to come to our lab. Anderson Malta verbalized understanding and expressed appreciation for assistance.

## 2022-02-13 NOTE — Addendum Note (Signed)
Addended by: Franchot Gallo on: 02/13/2022 04:22 PM   Modules accepted: Orders

## 2022-02-14 ENCOUNTER — Other Ambulatory Visit: Payer: Medicare PPO

## 2022-02-14 DIAGNOSIS — I10 Essential (primary) hypertension: Secondary | ICD-10-CM

## 2022-02-14 DIAGNOSIS — Z79899 Other long term (current) drug therapy: Secondary | ICD-10-CM | POA: Diagnosis not present

## 2022-02-14 LAB — BASIC METABOLIC PANEL WITH GFR
BUN/Creatinine Ratio: 20 (ref 10–24)
BUN: 29 mg/dL (ref 10–36)
CO2: 30 mmol/L — ABNORMAL HIGH (ref 20–29)
Calcium: 8.5 mg/dL — ABNORMAL LOW (ref 8.6–10.2)
Chloride: 100 mmol/L (ref 96–106)
Creatinine, Ser: 1.47 mg/dL — ABNORMAL HIGH (ref 0.76–1.27)
Glucose: 100 mg/dL — ABNORMAL HIGH (ref 70–99)
Potassium: 3.9 mmol/L (ref 3.5–5.2)
Sodium: 136 mmol/L (ref 134–144)
eGFR: 44 mL/min/{1.73_m2} — ABNORMAL LOW

## 2022-02-15 ENCOUNTER — Telehealth: Payer: Self-pay | Admitting: Interventional Cardiology

## 2022-02-15 NOTE — Telephone Encounter (Signed)
Lab results (BMP 02/14/22) faxed to Raynelle Fanning at # listed.

## 2022-02-15 NOTE — Telephone Encounter (Signed)
Ronnie Boyd is requesting that pt's lab results be faxed to their office. Please advise

## 2022-02-19 ENCOUNTER — Telehealth: Payer: Self-pay

## 2022-02-19 NOTE — Telephone Encounter (Signed)
I spoke with Ronnie Boyd at [facility] who is going to have to speak with management at [facility] location on his case. Either way the patient has not been seen since 10/2020 and since it is over a year insurance will require a new ov.  Called the daughter back to inform her that a new office visit is needed and asked her to call back.   When daughter calls back, please get the pt scheduled for ov. Pt has seen Tylene Fantasia in past and I placed a spot on thur 6/15, 11:45 am on hold for him.  If he is still using his old machine while waiting to get the new machine, we would have machine or card for DL. If able to get the DL prior to thursday we can make the phone a telephone/VV. If not he can bring it to apt and we can dl it. We need updated office notes in order to place the new order at that time.

## 2022-02-19 NOTE — Telephone Encounter (Signed)
Patient's daughter, Victorino Dike, left a voicemail with our office this afternoon asking for a call to discuss patient's CPAP.  Apparently patient did not receive a new CPAP this past year.  [facility] needs an updated order and office notes sent to them.  Patient's daughter would like a call back to discuss this further.  She can be reached at [phone].

## 2022-02-19 NOTE — Telephone Encounter (Signed)
-----   Message from Lyn Records, MD sent at 02/14/2022  5:21 PM EDT ----- Let the patient know the labs are looking slightly better which could mean that he is retaining fluid.  How is the swelling?  How is he feeling?  The potassium level is stable despite taking metolazone. A copy will be sent to Patrcia Dolly, DO

## 2022-02-19 NOTE — Telephone Encounter (Signed)
Left message for patient with lab results.  Per Dr. Tamala Julian: Let the patient know the labs are looking slightly better which could mean that he is retaining fluid.  How is the swelling?  How is he feeling?  The potassium level is stable despite taking metolazone.  Requested patient callback with status update, office number provided.

## 2022-02-20 ENCOUNTER — Telehealth: Payer: Self-pay | Admitting: Interventional Cardiology

## 2022-02-20 NOTE — Telephone Encounter (Signed)
FYI-Pt's daughter called back and pt was scheduled for the held spot.

## 2022-02-20 NOTE — Telephone Encounter (Signed)
Daughter calling back to update on how patient is feeling and also update on the home nurse that came to finish on yesterday. She also state she will be back with the patient at 430, if would need to speak to the patient. Please advise

## 2022-02-20 NOTE — Telephone Encounter (Signed)
Spoke with the patient's daughter.  The nurse said his weight is 135 pounds he was 145 at office last.  He has lost a lot of fluid weight per his daughter - he was over 170 pounds before all the diuretic therapy.  BP today 100/50.  Was instructed to change positions slowly to avoid drops/dizziness.  Swelling still present in feet but it has softened compared to previous. He has low energy still.  Fatigue.  No SOB.  Pox 99%   The patient has been taking metolazone daily since 02/14/22.  His daughter said that the instruction from our office was that he was to take just one dose to see if his weight came down.   She thinks [facility] had him start taking daily but she asked me to double check this with Dr. Katrinka Blazing.  The metolazone started on 6/7, or 6/8, after most recent labs.  I asked her to have him hold his evening dose of lasix today and the AM dose of metolazone tomorrow.  Will send all info to Dr. Katrinka Blazing and call her back with an update.  The patient wants to ask Dr. Katrinka Blazing again: Is there anything to do for him to do re: hoarseness he feels in his throat

## 2022-02-21 MED ORDER — METOLAZONE 2.5 MG PO TABS: 2.5000 mg | ORAL_TABLET | ORAL | 0 refills | Status: DC

## 2022-02-21 NOTE — Telephone Encounter (Signed)
I spoke with the patient's daughter.  She is with him this morning.  He denied SOB and has not been dizzy.  She voices understanding that metolazone is to be once weekly on Mondays starting next dose on 02/26/22.  Aware pt is to take 30 min prior to AM lasix dose.  Held last evening lasix and will continue holding through today and restart tomorrow at 80 mg BID.    Pt's daughter will communicate this with the patient and home health nurse.

## 2022-02-22 ENCOUNTER — Ambulatory Visit: Payer: Medicare PPO | Admitting: Adult Health

## 2022-02-22 ENCOUNTER — Encounter: Payer: Self-pay | Admitting: Adult Health

## 2022-02-22 VITALS — BP 116/46 | HR 64 | Ht 60.0 in | Wt 138.2 lb

## 2022-02-22 DIAGNOSIS — G4733 Obstructive sleep apnea (adult) (pediatric): Secondary | ICD-10-CM

## 2022-02-22 DIAGNOSIS — Z9989 Dependence on other enabling machines and devices: Secondary | ICD-10-CM

## 2022-02-22 NOTE — Progress Notes (Signed)
PATIENT: Ronnie Boyd DOB: 01/20/1927  REASON FOR VISIT: follow up HISTORY FROM: patient PRIMARY NEUROLOGIST: Dr. Vickey Huger  Chief Complaint  Patient presents with   Follow-up    Pt in 7 with daughter  pt is here for CPAP follow up Pt  states that he has not used CPAP in 3 years . Pt states he was suppose to get a new CPAP machine but due to covid he never received a new machine pt call [employer] May 2023 and she was told his new machine was attached to someone else account and they gave his machine to another patient    HISTORY OF PRESENT ILLNESS: Today 02/22/22:  Ronnie Boyd is a 88 year old male with a history of obstructive sleep apnea on CPAP.  He returns today for follow-up.  He states that he has not used his CPAP in 3 years.  He was supposed to get a new machine in February but the DME company assigned it to another patient.  He would like to get restarted on CPAP therapy.  He returns today for an evaluation    REVIEW OF SYSTEMS: Out of a complete 14 system review of symptoms, the patient complains only of the following symptoms, and all other reviewed systems are negative.   ESS 20  ALLERGIES: Allergies  Allergen Reactions   Crestor [Rosuvastatin Calcium]     Muscle cramping   Lipitor [Atorvastatin]     Leg cramping   Other     Environmental    Pravastatin     Leg cramping    HOME MEDICATIONS: Outpatient Medications Prior to Visit  Medication Sig Dispense Refill   albuterol (PROVENTIL HFA;VENTOLIN HFA) 108 (90 Base) MCG/ACT inhaler Inhale 2 puffs into the lungs every 6 (six) hours as needed for wheezing or shortness of breath. 1 Inhaler 0   Coenzyme Q10 200 MG capsule Take 200 mg by mouth daily.      fluticasone (FLONASE) 50 MCG/ACT nasal spray Place 2 sprays into both nostrils daily. 16 g 0   furosemide (LASIX) 80 MG tablet Take 1 tablet (80 mg total) by mouth 2 (two) times daily. 180 tablet 3   Garlic Oil 1000 MG CAPS Take 1 capsule by mouth daily.      isosorbide mononitrate (IMDUR) 60 MG 24 hr tablet TAKE 1 TABLET BY MOUTH EVERY DAY 90 tablet 3   loratadine (CLARITIN) 10 MG tablet Take 10 mg by mouth daily as needed for rhinitis.      metoprolol tartrate (LOPRESSOR) 25 MG tablet TAKE 1 TABLET BY MOUTH TWICE A DAY 180 tablet 3   Multiple Vitamins-Minerals (CENTRUM SILVER) tablet Take 1 tablet by mouth daily.     nitroGLYCERIN (NITROSTAT) 0.4 MG SL tablet Place 1 tablet (0.4 mg total) under the tongue every 5 (five) minutes as needed for chest pain. 25 tablet 3   Olopatadine HCl 0.2 % SOLN      Omega-3 Fatty Acids (FISH OIL) 1000 MG CAPS Take 1 capsule by mouth daily.     pantoprazole (PROTONIX) 40 MG tablet TAKE 1 TABLET (40 MG TOTAL) BY MOUTH DAILY. 30 tablet 11   potassium chloride (KLOR-CON) 10 MEQ tablet Take 1 tablet (10 mEq total) by mouth daily. 90 tablet 3   ranolazine (RANEXA) 500 MG 12 hr tablet TAKE 1 TABLET BY MOUTH TWICE A DAY 180 tablet 3   rosuvastatin (CRESTOR) 5 MG tablet TAKE 1 TABLET (5 MG TOTAL) BY MOUTH EVERY MONDAY, WEDNESDAY, AND FRIDAY. 45 tablet 3  tamsulosin (FLOMAX) 0.4 MG CAPS capsule Take 0.4 mg by mouth daily.     clopidogrel (PLAVIX) 75 MG tablet TAKE 1 TABLET BY MOUTH EVERY DAY 90 tablet 3   metolazone (ZAROXOLYN) 2.5 MG tablet Take 1 tablet (2.5 mg total) by mouth once a week. Take every Monday, 30 min before AM dose of furosemide (Patient not taking: Reported on 02/22/2022) 30 tablet 0   No facility-administered medications prior to visit.    PAST MEDICAL HISTORY: Past Medical History:  Diagnosis Date   Allergic rhinitis    Aortic stenosis    mild echo 7/14   Borderline type 2 diabetes mellitus    Coronary artery disease    Degenerative joint disease    Family history of adverse reaction to anesthesia    "daughter had problems when she was a teen; none since; it was related to asthma"   GERD (gastroesophageal reflux disease)    Hemorrhoids    History of hiatal hernia    "dx'd years ago" (06/01/2015)    Hypercholesteremia    Hypertension    OSA on CPAP since 05/23/2015   Prostatitis    hx of   PVD (peripheral vascular disease) (HCC)    Stroke (HCC)     PAST SURGICAL HISTORY: Past Surgical History:  Procedure Laterality Date   CARDIAC CATHETERIZATION [name] 03/18/2015   Procedure: Left Heart Cath and Coronary Angiography;  Surgeon: Laurey Morale, MD;  Location: [facility] INVASIVE CV LAB;  Service: Cardiovascular;  Laterality: [name];   CARDIAC CATHETERIZATION [name] 06/01/2015   Procedure: Coronary Stent Intervention;  Surgeon: Iran Ouch, MD;  Location: [facility] INVASIVE CV LAB;  Service: Cardiovascular;  Laterality: [name];   CARDIAC CATHETERIZATION  11/11/2015   CARDIAC CATHETERIZATION [name] 11/11/2015   Procedure: Intravascular Pressure Wire/FFR Study;  Surgeon: Tonny Bollman, MD;  Location: [facility] INVASIVE CV LAB;  Service: Cardiovascular;  Laterality: [name];   CARPAL TUNNEL RELEASE Bilateral 2006-2007   CATARACT EXTRACTION W/ INTRAOCULAR LENS  IMPLANT, BILATERAL Bilateral ~ 2003   EXCISIONAL HEMORRHOIDECTOMY  1989    FAMILY HISTORY: Family History  Problem Relation Age of Onset   Hypertension Mother    Diabetes Mother 90   Stroke Father 84   Diabetes Brother    Arthritis Other    Sleep apnea Neg Hx     SOCIAL HISTORY: Social History   Socioeconomic History   Marital status: Married    Spouse name: Not on file   Number of children: Not on file   Years of education: Not on file   Highest education level: Not on file  Occupational History   Occupation: retired  Tobacco Use   Smoking status: Never   Smokeless tobacco: Never  Vaping Use   Vaping Use: Never used  Substance and Sexual Activity   Alcohol use: No   Drug use: No   Sexual activity: Not Currently  Other Topics Concern   Not on file  Social History Narrative   Daughter is [name] Psychologist, sport and exercise at [employer])   Social Determinants of Health   Financial Resource Strain: Low Risk  (10/09/2021)   Overall  Financial Resource Strain (CARDIA)    Difficulty of Paying Living Expenses: Not hard at all  Food Insecurity: No Food Insecurity (10/09/2021)   Hunger Vital Sign    Worried About Running Out of Food in the Last Year: Never true    Ran Out of Food in the Last Year: Never true  Transportation Needs: No Transportation Needs (10/09/2021)  PRAPARE - Administrator, Civil Service (Medical): No    Lack of Transportation (Non-Medical): No  Physical Activity: Insufficiently Active (10/09/2021)   Exercise Vital Sign    Days of Exercise per Week: 2 days    Minutes of Exercise per Session: 20 min  Stress: No Stress Concern Present (10/09/2021)   [employer] of Occupational Health - Occupational Stress Questionnaire    Feeling of Stress : Not at all  Social Connections: Moderately Isolated (10/09/2021)   Social Connection and Isolation Panel [NHANES]    Frequency of Communication with Friends and Family: Three times a week    Frequency of Social Gatherings with Friends and Family: Three times a week    Attends Religious Services: Never    Active Member of Clubs or Organizations: No    Attends Banker Meetings: Never    Marital Status: Married  Catering manager Violence: Not At Risk (10/09/2021)   Humiliation, Afraid, Rape, and Kick questionnaire    Fear of Current or Ex-Partner: No    Emotionally Abused: No    Physically Abused: No    Sexually Abused: No      PHYSICAL EXAM  Vitals:   02/22/22 1203  BP: (!) 116/46  Pulse: 64  Weight: 138 lb 3.2 oz (62.7 kg)  Height: 5' (1.524 m)   Body mass index is 26.99 kg/m.  Generalized: Well developed, in no acute distress  Chest: Lungs clear to auscultation bilaterally  Neurological examination  Mentation: Alert oriented to time, place, history taking. Follows all commands speech and language fluent Cranial nerve II-XII: Extraocular movements were full, visual field were full on confrontational test Head turning  and shoulder shrug  were normal and symmetric. Gait and station: Gait is normal.    DIAGNOSTIC DATA (LABS, IMAGING, TESTING) - I reviewed patient records, labs, notes, testing and imaging myself where available.  Lab Results  Component Value Date   WBC 5.1 01/08/2022   HGB 11.8 (L) 01/08/2022   HCT 37.1 (L) 01/08/2022   MCV 77 (L) 01/08/2022   PLT 189 01/08/2022      Component Value Date/Time   NA 136 02/14/2022 1324   K 3.9 02/14/2022 1324   CL 100 02/14/2022 1324   CO2 30 (H) 02/14/2022 1324   GLUCOSE 100 (H) 02/14/2022 1324   GLUCOSE 88 08/19/2020 1552   BUN 29 02/14/2022 1324   CREATININE 1.47 (H) 02/14/2022 1324   CREATININE 1.53 (H) 08/27/2016 1529   CALCIUM 8.5 (L) 02/14/2022 1324   PROT 6.9 05/17/2020 1331   ALBUMIN 4.2 05/17/2020 1331   AST 18 05/17/2020 1331   ALT 12 05/17/2020 1331   ALKPHOS 40 (L) 05/17/2020 1331   BILITOT 0.7 05/17/2020 1331   GFRNONAA 44 (L) 10/29/2016 1242   GFRAA 51 (L) 10/29/2016 1242   Lab Results  Component Value Date   CHOL 195 05/17/2020   HDL 81 05/17/2020   LDLCALC 104 (H) 05/17/2020   LDLDIRECT 131.5 11/06/2011   TRIG 51 05/17/2020   CHOLHDL 2.4 05/17/2020   Lab Results  Component Value Date   HGBA1C 5.5 08/22/2021   HGBA1C 5.5 08/22/2021   HGBA1C 5.5 (A) 08/22/2021   HGBA1C 5.5 08/22/2021   No results found for: "VITAMINB12" Lab Results  Component Value Date   TSH 2.77 08/14/2019      ASSESSMENT AND PLAN 88 y.o. year old male  has a past medical history of Allergic rhinitis, Aortic stenosis, Borderline type 2 diabetes mellitus, Coronary artery disease,  Degenerative joint disease, Family history of adverse reaction to anesthesia, GERD (gastroesophageal reflux disease), Hemorrhoids, History of hiatal hernia, Hypercholesteremia, Hypertension, OSA on CPAP (since 05/23/2015), Prostatitis, PVD (peripheral vascular disease) (HCC), and Stroke (HCC). here with:  OSA on CPAP  - restart CPAP therapy - order resent for new  machine -FU once he has new machine   Butch Penny, MSN, NP-C 02/22/2022, 12:04 PM [facility] [address] [place], [place] [address] [phone]

## 2022-02-26 NOTE — Progress Notes (Signed)
Ross Ludwig, RN; Dimas Millin got it!      Previous Messages    ----- Message -----  From: Guy Begin, RN  Sent: 02/22/2022  12:23 PM EDT  To: Dimas Millin; Jari Favre   Good afternoon,   New order in Epic for pt.   Jacelyn Pi Gambino  Male, 88 y.o., July 10, 1927  MRN:  [id number]  Thank you   Andrey Campanile RN

## 2022-02-27 ENCOUNTER — Ambulatory Visit: Payer: Medicare PPO | Admitting: Internal Medicine

## 2022-03-01 ENCOUNTER — Telehealth: Payer: Self-pay | Admitting: Adult Health

## 2022-03-01 NOTE — Telephone Encounter (Signed)
Is having since last Friday more saiva at back of throut.  No other sx,  he wears nasal pillows.  APAP.  I relayed if can to turn humidity down and see if makes difference. She will do.  But wanted megan to know.

## 2022-03-01 NOTE — Telephone Encounter (Signed)
Pt's daughter is asking that pt be called to discuss saliva accumulating in back of his throat when using CPAP, please call pt.

## 2022-03-05 ENCOUNTER — Telehealth: Payer: Self-pay | Admitting: Interventional Cardiology

## 2022-03-05 NOTE — Telephone Encounter (Signed)
Pt's daughter states that pt has been grunting when he moves and has pain on left & right sides of his back. She states that this is a new pain and he has been holding his morning medication. Please advise.

## 2022-03-05 NOTE — Telephone Encounter (Signed)
Spoke with Victorino Dike, patient's daughter.  She reports she has noticed patient grunting in pain over the last 3 days when he ambulates. Per Victorino Dike, patient complains of pain in back on right and left side and around waist. He has not mentioned pain with urination. Victorino Dike reports he does not urinate as much as he did when his Lasix was increased. She reports patient's weight is stable at 138 lbs for the past week.   Victorino Dike reports patient has held his metolazone today due to pain in right and left side/back/waist. She reports he may have held his morning dose of Lasix as well but is not sure, patient is not with her at time of call. No other complaints reported.  [facility] nurse is scheduled to visit patient Tuesday 03/06/22.  Labs last drawn 02/14/22 Creatinine, serum: 1.47 eGFR: 44 Potassium 3.9  Will forward to Dr. Katrinka Blazing to review.

## 2022-03-07 ENCOUNTER — Other Ambulatory Visit: Payer: Self-pay | Admitting: Interventional Cardiology

## 2022-03-07 NOTE — Telephone Encounter (Signed)
Tried to call Slater back. Her VM answered immediately but was full. Will try again later.

## 2022-03-08 NOTE — Telephone Encounter (Signed)
I called pts daughter, Victorino Dike.  VM full.

## 2022-03-14 NOTE — Telephone Encounter (Signed)
Spoke to pt and his daughter Ronnie Boyd.  Pt has new mask (nasal and mouth).  Is better.  Is adjusting humidity as needed.  Is using at least 4 hours, ( had one night that hose became disconnected).  I said no worries.  Keep using consistently.  They will call as needed. Appreicated call back.

## 2022-04-10 ENCOUNTER — Telehealth: Payer: Self-pay

## 2022-04-10 NOTE — Telephone Encounter (Signed)
Called to check on patient and family. Victorino Dike said patient had lose 38 lbs and lack of appetite. Victorino Dike is asking for special meals to help with diet. Also stated patient is going to PCP on Thursday. I also spoke with patient on the phone, he sounded weak with some shortness of breathe.

## 2022-04-11 ENCOUNTER — Other Ambulatory Visit: Payer: Self-pay | Admitting: Internal Medicine

## 2022-04-11 NOTE — Progress Notes (Unsigned)
Cardiology Office Note:    Date:  04/12/2022   ID:  JURIEL CID, DOB 1927/08/25, MRN [id number]  PCP:  Pincus Sanes, MD  Cardiologist:  Lesleigh Noe, MD   Referring MD: Pincus Sanes, MD   No chief complaint on file.   History of Present Illness:    Ronnie Boyd is a 88 y.o. male with a hx of  severe aortic stenosis documented by recent echo, CAD with stents in the mid LAD, distal LAD, and ramus intermedius, hypertension, diabetes II, peripheral arterial disease, obstructive sleep apnea, and acute on chronic diastolic HF 02/16/2021.  Musculoskeletal discomfort statin intolerance to Lipitor, pravastatin, and Crestor.Palliative Care/Hospice and DNR.  Confirm DNR  The patient is doing okay.  He feels somewhat lightheaded when he stands.  He is able to lie flat.  Lower extremity edema has resolved.  No chest discomfort.  Decreased appetite.  Weight loss.  Past Medical History:  Diagnosis Date   Allergic rhinitis    Aortic stenosis    mild echo 7/14   Borderline type 2 diabetes mellitus    Coronary artery disease    Degenerative joint disease    Family history of adverse reaction to anesthesia    "daughter had problems when she was a teen; none since; it was related to asthma"   GERD (gastroesophageal reflux disease)    Hemorrhoids    History of hiatal hernia    "dx'd years ago" (06/01/2015)   Hypercholesteremia    Hypertension    OSA on CPAP since 05/23/2015   Prostatitis    hx of   PVD (peripheral vascular disease) (HCC)    Stroke [facility])     Past Surgical History:  Procedure Laterality Date   CARDIAC CATHETERIZATION [name] 03/18/2015   Procedure: Left Heart Cath and Coronary Angiography;  Surgeon: Laurey Morale, MD;  Location: [facility] INVASIVE CV LAB;  Service: Cardiovascular;  Laterality: [name];   CARDIAC CATHETERIZATION [name] 06/01/2015   Procedure: Coronary Stent Intervention;  Surgeon: Iran Ouch, MD;  Location: [facility] INVASIVE CV LAB;  Service: Cardiovascular;   Laterality: [name];   CARDIAC CATHETERIZATION  11/11/2015   CARDIAC CATHETERIZATION [name] 11/11/2015   Procedure: Intravascular Pressure Wire/FFR Study;  Surgeon: Tonny Bollman, MD;  Location: [facility] INVASIVE CV LAB;  Service: Cardiovascular;  Laterality: [name];   CARPAL TUNNEL RELEASE Bilateral 2006-2007   CATARACT EXTRACTION W/ INTRAOCULAR LENS  IMPLANT, BILATERAL Bilateral ~ 2003   EXCISIONAL HEMORRHOIDECTOMY  1989    Current Medications: Current Meds  Medication Sig   acetaminophen (TYLENOL) 325 MG tablet Take 1 mg by mouth every 6 (six) hours as needed.   albuterol (PROVENTIL HFA;VENTOLIN HFA) 108 (90 Base) MCG/ACT inhaler Inhale 2 puffs into the lungs every 6 (six) hours as needed for wheezing or shortness of breath.   Coenzyme Q10 200 MG capsule Take 200 mg by mouth daily.    fluticasone (FLONASE) 50 MCG/ACT nasal spray Place 2 sprays into both nostrils daily. (Patient taking differently: Place 2 sprays into both nostrils as needed.)   furosemide (LASIX) 80 MG tablet Take 1 tablet (80 mg total) by mouth 2 (two) times daily.   Garlic Oil 1000 MG CAPS Take 1 capsule by mouth daily.   isosorbide mononitrate (IMDUR) 60 MG 24 hr tablet TAKE 1 TABLET BY MOUTH EVERY DAY   loratadine (CLARITIN) 10 MG tablet Take 10 mg by mouth daily as needed for rhinitis.    metolazone (ZAROXOLYN) 2.5 MG tablet Take 1 tablet (2.5  mg total) by mouth once a week. Take every Monday, 30 min before AM dose of furosemide   metoprolol tartrate (LOPRESSOR) 25 MG tablet TAKE 1 TABLET BY MOUTH TWICE A DAY   Multiple Vitamins-Minerals (CENTRUM SILVER) tablet Take 1 tablet by mouth daily.   nitroGLYCERIN (NITROSTAT) 0.4 MG SL tablet Place 1 tablet (0.4 mg total) under the tongue every 5 (five) minutes as needed for chest pain.   Omega-3 Fatty Acids (FISH OIL) 1000 MG CAPS Take 1 capsule by mouth daily.   pantoprazole (PROTONIX) 40 MG tablet TAKE 1 TABLET (40 MG TOTAL) BY MOUTH DAILY.   potassium chloride (KLOR-CON) 10 MEQ tablet  Take 1 tablet (10 mEq total) by mouth daily.   ranolazine (RANEXA) 500 MG 12 hr tablet TAKE 1 TABLET BY MOUTH TWICE A DAY   rosuvastatin (CRESTOR) 5 MG tablet TAKE 1 TABLET (5 MG TOTAL) BY MOUTH EVERY MONDAY, WEDNESDAY, AND FRIDAY.   tamsulosin (FLOMAX) 0.4 MG CAPS capsule Take 0.4 mg by mouth daily.     Allergies:   Crestor [rosuvastatin calcium], Lipitor [atorvastatin], Other, and Pravastatin   Social History   Socioeconomic History   Marital status: Married    Spouse name: Not on file   Number of children: Not on file   Years of education: Not on file   Highest education level: Not on file  Occupational History   Occupation: retired  Tobacco Use   Smoking status: Never   Smokeless tobacco: Never  Vaping Use   Vaping Use: Never used  Substance and Sexual Activity   Alcohol use: No   Drug use: No   Sexual activity: Not Currently  Other Topics Concern   Not on file  Social History Narrative   Daughter is [name] Psychologist, sport and exercise at [employer])   Social Determinants of Health   Financial Resource Strain: Low Risk  (10/09/2021)   Overall Financial Resource Strain (CARDIA)    Difficulty of Paying Living Expenses: Not hard at all  Food Insecurity: No Food Insecurity (10/09/2021)   Hunger Vital Sign    Worried About Running Out of Food in the Last Year: Never true    Ran Out of Food in the Last Year: Never true  Transportation Needs: No Transportation Needs (10/09/2021)   PRAPARE - Administrator, Civil Service (Medical): No    Lack of Transportation (Non-Medical): No  Physical Activity: Insufficiently Active (10/09/2021)   Exercise Vital Sign    Days of Exercise per Week: 2 days    Minutes of Exercise per Session: 20 min  Stress: No Stress Concern Present (10/09/2021)   [employer] of Occupational Health - Occupational Stress Questionnaire    Feeling of Stress : Not at all  Social Connections: Moderately Isolated (10/09/2021)   Social  Connection and Isolation Panel [NHANES]    Frequency of Communication with Friends and Family: Three times a week    Frequency of Social Gatherings with Friends and Family: Three times a week    Attends Religious Services: Never    Active Member of Clubs or Organizations: No    Attends Engineer, structural: Never    Marital Status: Married     Family History: The patient's family history includes Arthritis in an other family member; Diabetes in his brother; Diabetes (age of onset: 59) in his mother; Hypertension in his mother; Stroke (age of onset: 43) in his father. There is no history of Sleep apnea.  ROS:   Please see the history  of present illness.    Some weakness.  Not urinating as much.  All other systems reviewed and are negative.  EKGs/Labs/Other Studies Reviewed:    The following studies were reviewed today: No new data  EKG:  EKG not repeated  Recent Labs: 01/08/2022: Hemoglobin 11.8; Platelets 189 01/30/2022: NT-Pro BNP 20,554 02/14/2022: BUN 29; Creatinine, Ser 1.47; Potassium 3.9; Sodium 136  Recent Lipid Panel    Component Value Date/Time   CHOL 195 05/17/2020 1331   CHOL 164 04/04/2015 1059   TRIG 51 05/17/2020 1331   TRIG 42 04/04/2015 1059   HDL 81 05/17/2020 1331   HDL 82 04/04/2015 1059   CHOLHDL 2.4 05/17/2020 1331   CHOLHDL 3 02/17/2020 1255   VLDL 9.0 02/17/2020 1255   LDLCALC 104 (H) 05/17/2020 1331   LDLCALC 74 04/04/2015 1059   LDLDIRECT 131.5 11/06/2011 1057    Physical Exam:    VS:  BP 126/64   Pulse 69   Ht 5' (1.524 m)   Wt 134 lb 3.2 oz (60.9 kg)   SpO2 96%   BMI 26.21 kg/m     Wt Readings from Last 3 Encounters:  04/12/22 134 lb 3.2 oz (60.9 kg)  02/22/22 138 lb 3.2 oz (62.7 kg)  01/30/22 145 lb 6.4 oz (66 kg)    Weight down 21 pounds since May. GEN: Obvious weight loss. No acute distress HEENT: Normal NECK: No JVD. LYMPHATICS: No lymphadenopathy CARDIAC: 4/6 high-pitched crescendo decrescendo systolic murmur. RRR no  gallop, or edema. VASCULAR:  Normal Pulses. No bruits. RESPIRATORY:  Clear to auscultation without rales, wheezing or rhonchi  ABDOMEN: Soft, non-tender, non-distended, No pulsatile mass, MUSCULOSKELETAL: No deformity  SKIN: Warm and dry NEUROLOGIC:  Alert and oriented x 3 PSYCHIATRIC:  Normal affect   ASSESSMENT:    1. Severe aortic stenosis   2. Acute on chronic diastolic HF (heart failure) (HCC)   3. Coronary artery disease involving native coronary artery of native heart without angina pectoris   4. OSA on CPAP   5. Peripheral vascular disease (HCC)   6. HYPERCHOLESTEROLEMIA    PLAN:    In order of problems listed above:  On hospice with DNR.  May be a little volume contracted. Plan basic metabolic panel today. No angina. Compliant with CPAP Denies claudication Did not discuss   Return in 6 to 8 weeks.  Basic metabolic panel today.  May have to adjust diuretic.   Medication Adjustments/Labs and Tests Ordered: Current medicines are reviewed at length with the patient today.  Concerns regarding medicines are outlined above.  No orders of the defined types were placed in this encounter.  No orders of the defined types were placed in this encounter.   There are no Patient Instructions on file for this visit.   Signed, Lesleigh Noe, MD  04/12/2022 4:05 PM     [facility]

## 2022-04-12 ENCOUNTER — Ambulatory Visit: Payer: Medicare PPO | Admitting: Interventional Cardiology

## 2022-04-12 ENCOUNTER — Encounter: Payer: Self-pay | Admitting: Interventional Cardiology

## 2022-04-12 VITALS — BP 126/64 | HR 69 | Ht 60.0 in | Wt 134.2 lb

## 2022-04-12 DIAGNOSIS — I5033 Acute on chronic diastolic (congestive) heart failure: Secondary | ICD-10-CM

## 2022-04-12 DIAGNOSIS — I739 Peripheral vascular disease, unspecified: Secondary | ICD-10-CM

## 2022-04-12 DIAGNOSIS — G4733 Obstructive sleep apnea (adult) (pediatric): Secondary | ICD-10-CM

## 2022-04-12 DIAGNOSIS — I350 Nonrheumatic aortic (valve) stenosis: Secondary | ICD-10-CM | POA: Diagnosis not present

## 2022-04-12 DIAGNOSIS — Z9989 Dependence on other enabling machines and devices: Secondary | ICD-10-CM

## 2022-04-12 DIAGNOSIS — I2510 Atherosclerotic heart disease of native coronary artery without angina pectoris: Secondary | ICD-10-CM | POA: Diagnosis not present

## 2022-04-12 DIAGNOSIS — E7800 Pure hypercholesterolemia, unspecified: Secondary | ICD-10-CM

## 2022-04-12 NOTE — Patient Instructions (Signed)
Medication Instructions:  Your physician recommends that you continue on your current medications as directed. Please refer to the Current Medication list given to you today.  *If you need a refill on your cardiac medications before your next appointment, please call your pharmacy*  Lab Work: TODAY: BMET If you have labs (blood work) drawn today and your tests are completely normal, you will receive your results only by: MyChart Message (if you have MyChart) OR A paper copy in the mail If you have any lab test that is abnormal or we need to change your treatment, we will call you to review the results.  Testing/Procedures: NONE  Follow-Up: At [facility], you and your health needs are our priority.  As part of our continuing mission to provide you with exceptional heart care, we have created designated Provider Care Teams.  These Care Teams include your primary Cardiologist (physician) and Advanced Practice Providers (APPs -  Physician Assistants and Nurse Practitioners) who all work together to provide you with the care you need, when you need it.  Your next appointment:   6-8 week(s)  The format for your next appointment:   In Person  Provider:   Lesleigh Noe, MD {   Important Information About Sugar

## 2022-04-13 ENCOUNTER — Telehealth: Payer: Self-pay

## 2022-04-13 LAB — BASIC METABOLIC PANEL
BUN/Creatinine Ratio: 23 (ref 10–24)
BUN: 37 mg/dL — ABNORMAL HIGH (ref 10–36)
CO2: 28 mmol/L (ref 20–29)
Calcium: 9.3 mg/dL (ref 8.6–10.2)
Chloride: 95 mmol/L — ABNORMAL LOW (ref 96–106)
Creatinine, Ser: 1.61 mg/dL — ABNORMAL HIGH (ref 0.76–1.27)
Glucose: 116 mg/dL — ABNORMAL HIGH (ref 70–99)
Potassium: 3.3 mmol/L — ABNORMAL LOW (ref 3.5–5.2)
Sodium: 138 mmol/L (ref 134–144)
eGFR: 40 mL/min/{1.73_m2} — ABNORMAL LOW (ref >59)

## 2022-04-13 MED ORDER — POTASSIUM CHLORIDE CRYS ER 20 MEQ PO TBCR: 20.0000 meq | EXTENDED_RELEASE_TABLET | Freq: Every day | ORAL | 3 refills | Status: AC

## 2022-04-13 MED ORDER — FUROSEMIDE 80 MG PO TABS: 80.0000 mg | ORAL_TABLET | Freq: Every morning | ORAL | 3 refills | Status: AC

## 2022-04-13 NOTE — Telephone Encounter (Signed)
-----   Message from Lyn Records, MD sent at 04/13/2022  8:25 AM EDT ----- Let the patient know the kidney function is demonstrating some evidence of stress.  Decrease the evening dose of furosemide to 40 mg nightly.  Therefore he will continue to take 80 mg in the a.m. and 40 mg at nighttime.  Continue taking metolazone as written.  Notify us if significant weight gain or swelling.  Increase potassium to 20 mEq/day.  Today take 40 mEq all at once.  Start the 20 mEq tomorrow. A copy will be sent to Patrcia Dolly, DO

## 2022-04-13 NOTE — Telephone Encounter (Signed)
Spoke with patient and discussed lab results, and recommendations.  Per Dr. Katrinka Blazing:  take 80 mg in the a.m. and 40 mg at nighttime.  Continue taking metolazone as written.  Notify us if significant weight gain or swelling.  Increase potassium to 20 mEq/day.  Today take 40 mEq all at once.  Start the 20 mEq tomorrow.   Instructed patient to take 4 tablets of his potassium chloride 10 meq today only, and I will send new Rx for potassium 20 meq QD to start tomorrow.  Patient verbalized understanding and requested I call his daughter Victorino Dike with changes as she helps manage his medications.  Spoke with daughter Victorino Dike, she verbalized understanding of the changes noted above.

## 2022-04-17 ENCOUNTER — Telehealth: Payer: Self-pay | Admitting: Interventional Cardiology

## 2022-04-17 NOTE — Telephone Encounter (Signed)
Patient's hospice nurse is on the line looking to talk with Dr. Katrinka Blazing or nurse in regards to patients medications

## 2022-04-17 NOTE — Telephone Encounter (Signed)
Hospice nurse wanted clarification of lasix and potassium dosages. Reviewed medications and verified dosages. No further questions.

## 2022-04-26 ENCOUNTER — Other Ambulatory Visit: Payer: Self-pay | Admitting: Internal Medicine

## 2022-05-01 ENCOUNTER — Encounter: Payer: Medicare PPO | Admitting: Family Medicine

## 2022-05-01 ENCOUNTER — Ambulatory Visit: Payer: Medicare PPO | Admitting: Adult Health

## 2022-05-09 ENCOUNTER — Ambulatory Visit: Payer: Medicare PPO | Admitting: Adult Health

## 2022-05-10 ENCOUNTER — Other Ambulatory Visit: Payer: Self-pay | Admitting: Interventional Cardiology

## 2022-05-10 ENCOUNTER — Telehealth: Payer: Self-pay | Admitting: Adult Health

## 2022-05-10 ENCOUNTER — Telehealth: Payer: Self-pay

## 2022-05-10 NOTE — Telephone Encounter (Signed)
Pt's daughter, Jaxtin Raimondo notified [facility] patient has passed away. Ms. Cordaro ask that please notify [employer] to not charge to his card.  Informed Ms. Vollmer she can drop the machine off at [facility] [facility].

## 2022-05-10 NOTE — Telephone Encounter (Signed)
Lets send a card 

## 2022-05-10 NOTE — Telephone Encounter (Signed)
Sent community message to [employer] [facility]/ [employer] to update them.

## 2022-05-10 NOTE — Telephone Encounter (Signed)
Son of pt called to notify the PCP that the pt passed away yesterday  05/29/22.  Est Time of death 10:45-11:10 pm.

## 2022-05-11 DEATH — deceased

## 2022-05-15 ENCOUNTER — Ambulatory Visit: Payer: Medicare PPO | Admitting: Adult Health

## 2022-05-15 NOTE — Telephone Encounter (Signed)
Card sent to home of patient.

## 2022-05-28 ENCOUNTER — Ambulatory Visit: Payer: Medicare PPO | Admitting: Interventional Cardiology
